# Patient Record
Sex: Female | Born: 1965 | Race: White | Hispanic: No | Marital: Married | Smoking: Never smoker
Health system: Southern US, Community
[De-identification: ages and names within clinical notes are randomized; demographics above are authoritative.]

## PROBLEM LIST (undated history)

## (undated) DIAGNOSIS — J189 Pneumonia, unspecified organism: Secondary | ICD-10-CM

## (undated) DIAGNOSIS — R112 Nausea with vomiting, unspecified: Secondary | ICD-10-CM

## (undated) DIAGNOSIS — I10 Essential (primary) hypertension: Secondary | ICD-10-CM

## (undated) DIAGNOSIS — F329 Major depressive disorder, single episode, unspecified: Secondary | ICD-10-CM

## (undated) DIAGNOSIS — N200 Calculus of kidney: Secondary | ICD-10-CM

## (undated) DIAGNOSIS — F419 Anxiety disorder, unspecified: Secondary | ICD-10-CM

## (undated) DIAGNOSIS — R000 Tachycardia, unspecified: Secondary | ICD-10-CM

## (undated) DIAGNOSIS — F32A Depression, unspecified: Secondary | ICD-10-CM

## (undated) DIAGNOSIS — Z9889 Other specified postprocedural states: Secondary | ICD-10-CM

## (undated) DIAGNOSIS — K219 Gastro-esophageal reflux disease without esophagitis: Secondary | ICD-10-CM

## (undated) DIAGNOSIS — K760 Fatty (change of) liver, not elsewhere classified: Secondary | ICD-10-CM

## (undated) DIAGNOSIS — E119 Type 2 diabetes mellitus without complications: Secondary | ICD-10-CM

## (undated) HISTORY — PX: APPENDECTOMY: SHX54

## (undated) HISTORY — PX: CHOLECYSTECTOMY: SHX55

## (undated) SURGERY — UPPER ENDOSCOPIC ULTRASOUND (EUS) RADIAL
Anesthesia: Monitor Anesthesia Care

---
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
## 2013-11-29 ENCOUNTER — Other Ambulatory Visit: Payer: Self-pay | Admitting: Urology

## 2013-11-29 ENCOUNTER — Ambulatory Visit (INDEPENDENT_AMBULATORY_CARE_PROVIDER_SITE_OTHER): Payer: No Typology Code available for payment source | Admitting: Urology

## 2013-11-29 DIAGNOSIS — R3129 Other microscopic hematuria: Secondary | ICD-10-CM

## 2013-11-29 DIAGNOSIS — R312 Other microscopic hematuria: Secondary | ICD-10-CM

## 2013-12-20 ENCOUNTER — Other Ambulatory Visit (HOSPITAL_COMMUNITY): Payer: Self-pay

## 2014-01-03 ENCOUNTER — Ambulatory Visit (HOSPITAL_COMMUNITY): Payer: No Typology Code available for payment source

## 2014-02-03 ENCOUNTER — Ambulatory Visit (HOSPITAL_COMMUNITY): Payer: Private Health Insurance - Indemnity

## 2014-02-07 ENCOUNTER — Ambulatory Visit (INDEPENDENT_AMBULATORY_CARE_PROVIDER_SITE_OTHER): Payer: Private Health Insurance - Indemnity | Admitting: Urology

## 2014-02-07 DIAGNOSIS — R312 Other microscopic hematuria: Secondary | ICD-10-CM

## 2015-02-24 ENCOUNTER — Other Ambulatory Visit: Payer: Self-pay | Admitting: Urology

## 2015-02-24 DIAGNOSIS — R3129 Other microscopic hematuria: Secondary | ICD-10-CM

## 2015-03-06 ENCOUNTER — Ambulatory Visit (HOSPITAL_COMMUNITY)
Admission: RE | Admit: 2015-03-06 | Discharge: 2015-03-06 | Disposition: A | Discharge status: Home / Self Care | Payer: BLUE CROSS/BLUE SHIELD | Source: Ambulatory Visit | Attending: Urology | Admitting: Urology

## 2015-03-06 DIAGNOSIS — R3129 Other microscopic hematuria: Secondary | ICD-10-CM | POA: Insufficient documentation

## 2015-03-06 DIAGNOSIS — Z0000 Encounter for general adult medical examination without abnormal findings: Secondary | ICD-10-CM | POA: Diagnosis present

## 2015-03-06 MED ORDER — IOHEXOL 300 MG/ML  SOLN
125.0000 mL | Freq: Once | INTRAMUSCULAR | Status: AC | PRN
  Administered 2015-03-06: 150 mL via INTRAVENOUS

## 2015-03-06 MED ORDER — SODIUM CHLORIDE 0.9 % IV SOLN
INTRAVENOUS | Status: AC
  Filled 2015-03-06: qty 250

## 2018-01-31 DIAGNOSIS — M1990 Unspecified osteoarthritis, unspecified site: Secondary | ICD-10-CM

## 2018-01-31 DIAGNOSIS — C801 Malignant (primary) neoplasm, unspecified: Secondary | ICD-10-CM

## 2018-01-31 HISTORY — DX: Malignant (primary) neoplasm, unspecified: C80.1

## 2018-01-31 HISTORY — DX: Unspecified osteoarthritis, unspecified site: M19.90

## 2018-07-02 DIAGNOSIS — Z87442 Personal history of urinary calculi: Secondary | ICD-10-CM

## 2018-07-02 HISTORY — DX: Personal history of urinary calculi: Z87.442

## 2018-07-08 ENCOUNTER — Other Ambulatory Visit: Payer: Self-pay

## 2018-07-08 ENCOUNTER — Emergency Department (HOSPITAL_COMMUNITY): Payer: BC Managed Care – PPO

## 2018-07-08 ENCOUNTER — Encounter (HOSPITAL_COMMUNITY): Payer: Self-pay | Admitting: *Deleted

## 2018-07-08 ENCOUNTER — Inpatient Hospital Stay (HOSPITAL_COMMUNITY)
Admission: EM | Admit: 2018-07-08 | Discharge: 2018-07-14 | DRG: 177 | Disposition: A | Discharge status: Home / Self Care | Payer: BC Managed Care – PPO | Attending: Internal Medicine | Admitting: Internal Medicine

## 2018-07-08 DIAGNOSIS — Z833 Family history of diabetes mellitus: Secondary | ICD-10-CM

## 2018-07-08 DIAGNOSIS — N390 Urinary tract infection, site not specified: Secondary | ICD-10-CM | POA: Diagnosis not present

## 2018-07-08 DIAGNOSIS — E876 Hypokalemia: Secondary | ICD-10-CM | POA: Diagnosis present

## 2018-07-08 DIAGNOSIS — Z791 Long term (current) use of non-steroidal anti-inflammatories (NSAID): Secondary | ICD-10-CM | POA: Diagnosis not present

## 2018-07-08 DIAGNOSIS — R000 Tachycardia, unspecified: Secondary | ICD-10-CM | POA: Diagnosis present

## 2018-07-08 DIAGNOSIS — K760 Fatty (change of) liver, not elsewhere classified: Secondary | ICD-10-CM | POA: Diagnosis present

## 2018-07-08 DIAGNOSIS — R109 Unspecified abdominal pain: Secondary | ICD-10-CM | POA: Diagnosis not present

## 2018-07-08 DIAGNOSIS — Z881 Allergy status to other antibiotic agents status: Secondary | ICD-10-CM | POA: Diagnosis not present

## 2018-07-08 DIAGNOSIS — T380X5A Adverse effect of glucocorticoids and synthetic analogues, initial encounter: Secondary | ICD-10-CM | POA: Diagnosis present

## 2018-07-08 DIAGNOSIS — Z9049 Acquired absence of other specified parts of digestive tract: Secondary | ICD-10-CM

## 2018-07-08 DIAGNOSIS — J9601 Acute respiratory failure with hypoxia: Secondary | ICD-10-CM | POA: Diagnosis present

## 2018-07-08 DIAGNOSIS — N200 Calculus of kidney: Secondary | ICD-10-CM | POA: Diagnosis not present

## 2018-07-08 DIAGNOSIS — E785 Hyperlipidemia, unspecified: Secondary | ICD-10-CM | POA: Diagnosis present

## 2018-07-08 DIAGNOSIS — Z6841 Body Mass Index (BMI) 40.0 and over, adult: Secondary | ICD-10-CM

## 2018-07-08 DIAGNOSIS — U071 COVID-19: Secondary | ICD-10-CM | POA: Diagnosis present

## 2018-07-08 DIAGNOSIS — R112 Nausea with vomiting, unspecified: Secondary | ICD-10-CM | POA: Diagnosis not present

## 2018-07-08 DIAGNOSIS — N136 Pyonephrosis: Secondary | ICD-10-CM | POA: Diagnosis present

## 2018-07-08 DIAGNOSIS — N1330 Unspecified hydronephrosis: Secondary | ICD-10-CM | POA: Diagnosis present

## 2018-07-08 DIAGNOSIS — J84116 Cryptogenic organizing pneumonia: Secondary | ICD-10-CM | POA: Diagnosis present

## 2018-07-08 DIAGNOSIS — Z8249 Family history of ischemic heart disease and other diseases of the circulatory system: Secondary | ICD-10-CM | POA: Diagnosis not present

## 2018-07-08 DIAGNOSIS — Z79899 Other long term (current) drug therapy: Secondary | ICD-10-CM

## 2018-07-08 DIAGNOSIS — J1289 Other viral pneumonia: Secondary | ICD-10-CM | POA: Diagnosis present

## 2018-07-08 DIAGNOSIS — Z7984 Long term (current) use of oral hypoglycemic drugs: Secondary | ICD-10-CM

## 2018-07-08 DIAGNOSIS — N3000 Acute cystitis without hematuria: Secondary | ICD-10-CM | POA: Diagnosis not present

## 2018-07-08 DIAGNOSIS — F419 Anxiety disorder, unspecified: Secondary | ICD-10-CM | POA: Diagnosis present

## 2018-07-08 DIAGNOSIS — J189 Pneumonia, unspecified organism: Secondary | ICD-10-CM | POA: Diagnosis present

## 2018-07-08 DIAGNOSIS — R945 Abnormal results of liver function studies: Secondary | ICD-10-CM | POA: Diagnosis present

## 2018-07-08 DIAGNOSIS — E1165 Type 2 diabetes mellitus with hyperglycemia: Secondary | ICD-10-CM | POA: Diagnosis present

## 2018-07-08 DIAGNOSIS — Z87442 Personal history of urinary calculi: Secondary | ICD-10-CM

## 2018-07-08 DIAGNOSIS — I10 Essential (primary) hypertension: Secondary | ICD-10-CM | POA: Diagnosis present

## 2018-07-08 HISTORY — DX: Tachycardia, unspecified: R00.0

## 2018-07-08 HISTORY — DX: Calculus of kidney: N20.0

## 2018-07-08 HISTORY — DX: Essential (primary) hypertension: I10

## 2018-07-08 HISTORY — DX: Fatty (change of) liver, not elsewhere classified: K76.0

## 2018-07-08 HISTORY — DX: Type 2 diabetes mellitus without complications: E11.9

## 2018-07-08 LAB — CBC WITH DIFFERENTIAL/PLATELET
Abs Immature Granulocytes: 0.05 10*3/uL (ref 0.00–0.07)
Basophils Absolute: 0 10*3/uL (ref 0.0–0.1)
Basophils Relative: 0 %
Eosinophils Absolute: 0.1 10*3/uL (ref 0.0–0.5)
Eosinophils Relative: 1 %
HCT: 38.7 % (ref 36.0–46.0)
Hemoglobin: 12.5 g/dL (ref 12.0–15.0)
Immature Granulocytes: 1 %
Lymphocytes Relative: 14 %
Lymphs Abs: 1.3 10*3/uL (ref 0.7–4.0)
MCH: 28.1 pg (ref 26.0–34.0)
MCHC: 32.3 g/dL (ref 30.0–36.0)
MCV: 87 fL (ref 80.0–100.0)
Monocytes Absolute: 0.5 10*3/uL (ref 0.1–1.0)
Monocytes Relative: 5 %
Neutro Abs: 7.5 10*3/uL (ref 1.7–7.7)
Neutrophils Relative %: 79 %
Platelets: 345 10*3/uL (ref 150–400)
RBC: 4.45 MIL/uL (ref 3.87–5.11)
RDW: 13.4 % (ref 11.5–15.5)
WBC: 9.4 10*3/uL (ref 4.0–10.5)
nRBC: 0 % (ref 0.0–0.2)

## 2018-07-08 LAB — URINALYSIS, ROUTINE W REFLEX MICROSCOPIC
Bilirubin Urine: NEGATIVE
Glucose, UA: NEGATIVE mg/dL
Ketones, ur: NEGATIVE mg/dL
Nitrite: NEGATIVE
Protein, ur: 100 mg/dL — AB
Specific Gravity, Urine: 1.021 (ref 1.005–1.030)
pH: 6 (ref 5.0–8.0)

## 2018-07-08 LAB — CBG MONITORING, ED: Glucose-Capillary: 171 mg/dL — ABNORMAL HIGH (ref 70–99)

## 2018-07-08 LAB — FERRITIN: Ferritin: 461 ng/mL — ABNORMAL HIGH (ref 11–307)

## 2018-07-08 LAB — COMPREHENSIVE METABOLIC PANEL
ALT: 59 U/L — ABNORMAL HIGH (ref 0–44)
AST: 46 U/L — ABNORMAL HIGH (ref 15–41)
Albumin: 3.4 g/dL — ABNORMAL LOW (ref 3.5–5.0)
Alkaline Phosphatase: 78 U/L (ref 38–126)
Anion gap: 16 — ABNORMAL HIGH (ref 5–15)
BUN: 16 mg/dL (ref 6–20)
CO2: 20 mmol/L — ABNORMAL LOW (ref 22–32)
Calcium: 8.4 mg/dL — ABNORMAL LOW (ref 8.9–10.3)
Chloride: 101 mmol/L (ref 98–111)
Creatinine, Ser: 0.95 mg/dL (ref 0.44–1.00)
GFR calc Af Amer: >60 mL/min (ref >60)
GFR calc non Af Amer: >60 mL/min (ref >60)
Glucose, Bld: 233 mg/dL — ABNORMAL HIGH (ref 70–99)
Potassium: 2.7 mmol/L — CL (ref 3.5–5.1)
Sodium: 137 mmol/L (ref 135–145)
Total Bilirubin: 0.5 mg/dL (ref 0.3–1.2)
Total Protein: 8.1 g/dL (ref 6.5–8.1)

## 2018-07-08 LAB — LACTATE DEHYDROGENASE: LDH: 190 U/L (ref 98–192)

## 2018-07-08 LAB — SARS CORONAVIRUS 2 BY RT PCR (HOSPITAL ORDER, PERFORMED IN ~~LOC~~ HOSPITAL LAB): SARS Coronavirus 2: POSITIVE — AB

## 2018-07-08 LAB — BRAIN NATRIURETIC PEPTIDE: B Natriuretic Peptide: 12 pg/mL (ref 0.0–100.0)

## 2018-07-08 LAB — D-DIMER, QUANTITATIVE: D-Dimer, Quant: 0.95 ug/mL-FEU — ABNORMAL HIGH (ref 0.00–0.50)

## 2018-07-08 LAB — SEDIMENTATION RATE: Sed Rate: 65 mm/hr — ABNORMAL HIGH (ref 0–22)

## 2018-07-08 LAB — PROCALCITONIN: Procalcitonin: <0.1 ng/mL

## 2018-07-08 MED ORDER — POTASSIUM CHLORIDE IN NACL 20-0.9 MEQ/L-% IV SOLN
INTRAVENOUS | Status: DC
  Administered 2018-07-08 – 2018-07-09 (×2): via INTRAVENOUS
  Filled 2018-07-08 (×2): qty 1000

## 2018-07-08 MED ORDER — POTASSIUM CHLORIDE 10 MEQ/100ML IV SOLN
10.0000 meq | Freq: Once | INTRAVENOUS | Status: AC
  Administered 2018-07-08: 18:00:00 10 meq via INTRAVENOUS

## 2018-07-08 MED ORDER — SODIUM CHLORIDE 0.9 % IV BOLUS
1000.0000 mL | Freq: Once | INTRAVENOUS | Status: AC
  Administered 2018-07-08: 16:00:00 1000 mL via INTRAVENOUS

## 2018-07-08 MED ORDER — SODIUM CHLORIDE 0.9 % IV SOLN
1.0000 g | Freq: Once | INTRAVENOUS | Status: AC
  Administered 2018-07-08: 18:00:00 1 g via INTRAVENOUS
  Filled 2018-07-08: qty 10

## 2018-07-08 MED ORDER — CLONAZEPAM 0.5 MG PO TABS
0.2500 mg | ORAL_TABLET | Freq: Once | ORAL | Status: AC
  Administered 2018-07-09: 0.25 mg via ORAL
  Filled 2018-07-08: qty 1

## 2018-07-08 MED ORDER — PROMETHAZINE HCL 25 MG/ML IJ SOLN
12.5000 mg | Freq: Once | INTRAMUSCULAR | Status: AC
  Administered 2018-07-08: 16:00:00 12.5 mg via INTRAVENOUS
  Filled 2018-07-08: qty 1

## 2018-07-08 MED ORDER — POTASSIUM CHLORIDE 10 MEQ/100ML IV SOLN
INTRAVENOUS | Status: AC
  Filled 2018-07-08: qty 100

## 2018-07-08 MED ORDER — SODIUM CHLORIDE 0.9 % IV SOLN
INTRAVENOUS | Status: AC
  Filled 2018-07-08: qty 10

## 2018-07-08 MED ORDER — SODIUM CHLORIDE 0.9 % IV SOLN
500.0000 mg | Freq: Once | INTRAVENOUS | Status: AC
  Administered 2018-07-08: 22:00:00 500 mg via INTRAVENOUS
  Filled 2018-07-08: qty 500

## 2018-07-08 MED ORDER — POTASSIUM CHLORIDE CRYS ER 20 MEQ PO TBCR
40.0000 meq | EXTENDED_RELEASE_TABLET | Freq: Once | ORAL | Status: DC
  Filled 2018-07-08: qty 2

## 2018-07-08 MED ORDER — METOCLOPRAMIDE HCL 5 MG/ML IJ SOLN
10.0000 mg | Freq: Once | INTRAMUSCULAR | Status: AC
  Administered 2018-07-08: 18:00:00 10 mg via INTRAVENOUS
  Filled 2018-07-08: qty 2

## 2018-07-08 MED ORDER — INSULIN ASPART 100 UNIT/ML ~~LOC~~ SOLN
0.0000 [IU] | SUBCUTANEOUS | Status: DC
  Administered 2018-07-09 (×3): 2 [IU] via SUBCUTANEOUS
  Filled 2018-07-08 (×2): qty 1

## 2018-07-08 MED ORDER — POTASSIUM CHLORIDE 10 MEQ/100ML IV SOLN
10.0000 meq | Freq: Once | INTRAVENOUS | Status: AC
  Administered 2018-07-08: 19:00:00 10 meq via INTRAVENOUS
  Filled 2018-07-08: qty 100

## 2018-07-08 MED ORDER — ONDANSETRON HCL 4 MG/2ML IJ SOLN
4.0000 mg | Freq: Four times a day (QID) | INTRAMUSCULAR | Status: DC | PRN
  Administered 2018-07-09: 04:00:00 4 mg via INTRAVENOUS
  Filled 2018-07-08: qty 2

## 2018-07-08 MED ORDER — SODIUM CHLORIDE 0.9 % IV BOLUS
1000.0000 mL | Freq: Once | INTRAVENOUS | Status: AC
  Administered 2018-07-08: 1000 mL via INTRAVENOUS

## 2018-07-08 MED ORDER — HYDROMORPHONE HCL 1 MG/ML IJ SOLN: 0.5000 mg | Freq: Four times a day (QID) | INTRAMUSCULAR | Status: DC | PRN

## 2018-07-08 MED ORDER — HYDROMORPHONE HCL 1 MG/ML IJ SOLN: 0.5000 mg | Freq: Once | INTRAMUSCULAR | Status: DC

## 2018-07-08 NOTE — ED Notes (Signed)
Pt still feels nauseated and cannot tolerate po K dur at this time.  MD made aware.

## 2018-07-08 NOTE — ED Notes (Signed)
CONTACT INFO:  Merrily Pew (husband): [phone]

## 2018-07-08 NOTE — H&P (Signed)
TRH H&P    Patient Demographics:    Leah Farmer, is a 53 y.o. female  MRN: [id number]  DOB - 11-07-1965  Admit Date - 07/08/2018  Referring MD/NP/PA:  Denton Meek  Outpatient Primary MD for the patient is Margy Clarks, NP [facility] in the past  Patient coming from:  home  Chief complaint- n/v, bilateral flank pain with radiation to bilateral groin   HPI:    Leah Farmer  is a 53 y.o. female, w Dm2, h/o nephrolithiasis (passed by self), presents with c/o bilateral flank pain radiating to groin since this past Monday.  Pt also notes n/v, possibly slight dysuria.  No hematuria. No fever/ chills.  Pt notes slight dry cough, w slight dyspnea.  Pt denies cp, palp, abd pain, diarrhea, brbpr.  Pt presented primarily due to flank pain, and n/v.   In ED,  T 99.9  P 130  R 27  Bp 106/74  Pox 92% on RA Wt 113.4 kg  CT renal stone IMPRESSION: 1. There are numerous stacked calculi in the distal left ureter, measuring approximately 4.7 cm in total length, the largest discrete calculus measuring 1.4 cm (series 5, image 65). There is moderate left hydronephrosis and hydroureter. There is an additional nonobstructive calculus in the inferior pole of the left kidney. 2. There is extensive, irregular bibasilar opacity and consolidation in a peribronchovascular pattern, of uncertain etiology, possibly acutely infectious or inflammatory although chronic interstitial lung disease is a differential consideration. Recommend nonemergent ILD protocol CT examination of the chest to further evaluate. 3.  Hepatic steatosis.  CXR IMPRESSION: 1. Unusual appearance in the lungs, which based on comparison with recent CT the abdomen and pelvis is favored to reflect widespread cryptogenic organizing pneumonia, likely sequela of recent atypical infection (including viral pneumonia such as COVID-19).  Wbc 9.4,  Hgb 12.5, Plt 345 Na 137, K 2.7   Bun  16, Creatinine 0.95 Hco3=20 Glucose 233  Ekg st at 120, LAD, nl int, no st- t changes c/w ischemia  Kcl 40 meq po x1 , dilaudid 0.12m iv x1 , phenergan 12.51miv x1, reglan 1055mv x1 given in ED,  Rocephin 1mg64m x1 given in ED, zithromax 500mg86mx1 given in ED.   Pt will be admitted for n/v, bilateral flank pain secondary to nephrolithiasis with L hydronephrosis, and acute UTI, and also CAP/ Covid -19 +      Review of systems:    In addition to the HPI above,  Pt doesn't note any altered taste or smell No Fever-chills, No Headache, No changes with Vision or hearing, No problems swallowing food or Liquids, No Chest pain, No Abdominal pain, bowel movements are regular, No Blood in stool or Urine, No dysuria, No new skin rashes or bruises, No new joints pains-aches,  No new weakness, tingling, numbness in any extremity, No recent weight gain or loss, No polyuria, polydypsia or polyphagia, No significant Mental Stressors.  All other systems reviewed and are negative.    Past History of the following :  Past Medical History:  Diagnosis Date  . Diabetes mellitus without complication ([place])   . Hypertension   . NAFL (nonalcoholic fatty liver) 67/54/4920   fatty liver seen on CT scan abd/ pelvis  . Nephrolithiasis   . Tachycardia       Past Surgical History:  Procedure Laterality Date  . APPENDECTOMY    . CHOLECYSTECTOMY        Social History:      Social History   Tobacco Use  . Smoking status: Never Smoker  . Smokeless tobacco: Never Used  Substance Use Topics  . Alcohol use: Never    Frequency: Never       Family History :     Family History  Problem Relation Age of Onset  . Hypertension Mother   . Diabetes Mother   . Hypertension Father   . Diabetes Father      Home Medications:   Prior to Admission medications   Medication Sig Start Date End Date Taking? Authorizing Provider  clonazePAM  (KLONOPIN) 0.5 MG tablet Take 0.25-0.5 mg by mouth daily as needed for anxiety.   Yes [provider]  ibuprofen (ADVIL) 200 MG tablet Take 200-800 mg by mouth every 6 (six) hours as needed for mild pain or moderate pain.   Yes [provider]  lisinopril-hydrochlorothiazide (ZESTORETIC) 20-12.5 MG tablet Take 1 tablet by mouth daily.   Yes [provider]  meclizine (ANTIVERT) 12.5 MG tablet Take 12.5 mg by mouth 3 (three) times daily as needed for dizziness.   Yes [provider]  metoprolol succinate (TOPROL-XL) 50 MG 24 hr tablet Take 50 mg by mouth daily. Take with or immediately following a meal.   Yes [provider]  norgestrel-ethinyl estradiol (CRYSELLE-28) 0.3-30 MG-MCG tablet Take 1 tablet by mouth daily.   Yes [provider]  omeprazole (PRILOSEC) 20 MG capsule Take 20 mg by mouth every morning.   Yes [provider]  rosuvastatin (CRESTOR) 10 MG tablet Take 10 mg by mouth at bedtime.   Yes [provider]  SitaGLIPtin-MetFORMIN HCl (JANUMET XR) [phone] MG TB24 Take 1 tablet by mouth every evening.   Yes [provider]  Vitamin D, Ergocalciferol, (DRISDOL) 1.25 MG (50000 UT) CAPS capsule Take 50,000 Units by mouth every Saturday.   Yes [provider]     Allergies:     Allergies  Allergen Reactions  . Ciprofloxacin Hives     Physical Exam:   Vitals  Blood pressure 106/74, pulse (!) 117, temperature 99.9 F (37.7 C), temperature source Oral, resp. rate (!) 22, height '5\' 4"'  (1.626 m), weight 113.4 kg, SpO2 92 %.  1.  General: axox3  2. Psychiatric: euthymic  3. Neurologic: cn2-12 intact, reflexes 2+symmetric, direct, consensual, near intact Motor 5/5 in all 4 ext  4. HEENMT:  Anicteric, pupils  1.13m symmetric, direct, consensual, near intact Mucous membranes moist Neck: no jvd, no bruit   5. Respiratory : Slight crackles bilateral base, no wheezing, tachypneic  6.  Cardiovascular : Tachycardic s1, s2, no m/g/r   7. Gastrointestinal:  Abd: soft, nt, nd, +bs  8. Skin:  Ext: no c/c/e, no rash No CVA tenderness  9.Musculoskeletal:  Good ROM,  No adenopathy    Data Review:    CBC Recent Labs  Lab 07/08/18 1610  WBC 9.4  HGB 12.5  HCT 38.7  PLT 345  MCV 87.0  MCH 28.1  MCHC 32.3  RDW 13.4  LYMPHSABS 1.3  MONOABS 0.5  EOSABS  0.1  BASOSABS 0.0   ------------------------------------------------------------------------------------------------------------------  Results for orders placed or performed during the hospital encounter of 07/08/18 (from the past 48 hour(s))  CBC with Differential/Platelet     Status: None   Collection Time: 07/08/18  4:10 PM  Result Value Ref Range   WBC 9.4 4.0 - 10.5 K/uL   RBC 4.45 3.87 - 5.11 MIL/uL   Hemoglobin 12.5 12.0 - 15.0 g/dL   HCT 38.7 36.0 - 46.0 %   MCV 87.0 80.0 - 100.0 fL   MCH 28.1 26.0 - 34.0 pg   MCHC 32.3 30.0 - 36.0 g/dL   RDW 13.4 11.5 - 15.5 %   Platelets 345 150 - 400 K/uL   nRBC 0.0 0.0 - 0.2 %   Neutrophils Relative % 79 %   Neutro Abs 7.5 1.7 - 7.7 K/uL   Lymphocytes Relative 14 %   Lymphs Abs 1.3 0.7 - 4.0 K/uL   Monocytes Relative 5 %   Monocytes Absolute 0.5 0.1 - 1.0 K/uL   Eosinophils Relative 1 %   Eosinophils Absolute 0.1 0.0 - 0.5 K/uL   Basophils Relative 0 %   Basophils Absolute 0.0 0.0 - 0.1 K/uL   Immature Granulocytes 1 %   Abs Immature Granulocytes 0.05 0.00 - 0.07 K/uL    Comment: Performed at [facility], [address], [place], [place] [address]  Comprehensive metabolic panel     Status: Abnormal   Collection Time: 07/08/18  4:10 PM  Result Value Ref Range   Sodium 137 135 - 145 mmol/L   Potassium 2.7 (LL) 3.5 - 5.1 mmol/L    Comment: CRITICAL RESULT CALLED TO, READ BACK BY AND VERIFIED WITH: CARDWELL,L'@1711'  BY MATTHEWS, B 6.7.2020    Chloride 101 98 - 111 mmol/L   CO2 20 (L) 22 - 32 mmol/L   Glucose, Bld 233 (H) 70 - 99 mg/dL   BUN 16 6 - 20  mg/dL   Creatinine, Ser 0.95 0.44 - 1.00 mg/dL   Calcium 8.4 (L) 8.9 - 10.3 mg/dL   Total Protein 8.1 6.5 - 8.1 g/dL   Albumin 3.4 (L) 3.5 - 5.0 g/dL   AST 46 (H) 15 - 41 U/L   ALT 59 (H) 0 - 44 U/L   Alkaline Phosphatase 78 38 - 126 U/L   Total Bilirubin 0.5 0.3 - 1.2 mg/dL   GFR calc non Af Amer >60 >60 mL/min   GFR calc Af Amer >60 >60 mL/min   Anion gap 16 (H) 5 - 15    Comment: Performed at [facility], [address], [place], [place] [address]  Urinalysis, Routine w reflex microscopic     Status: Abnormal   Collection Time: 07/08/18  4:11 PM  Result Value Ref Range   Color, Urine AMBER (A) YELLOW    Comment: BIOCHEMICALS MAY BE AFFECTED BY COLOR   APPearance CLOUDY (A) CLEAR   Specific Gravity, Urine 1.021 1.005 - 1.030   pH 6.0 5.0 - 8.0   Glucose, UA NEGATIVE NEGATIVE mg/dL   Hgb urine dipstick MODERATE (A) NEGATIVE   Bilirubin Urine NEGATIVE NEGATIVE   Ketones, ur NEGATIVE NEGATIVE mg/dL   Protein, ur 100 (A) NEGATIVE mg/dL   Nitrite NEGATIVE NEGATIVE   Leukocytes,Ua TRACE (A) NEGATIVE   RBC / HPF 6-10 0 - 5 RBC/hpf   WBC, UA 11-20 0 - 5 WBC/hpf   Bacteria, UA RARE (A) NONE SEEN   Squamous Epithelial / LPF 6-10 0 - 5   Mucus PRESENT  Comment: Performed at [facility], [address], [place], [place] [address]  SARS Coronavirus 2 ([place]- Performed in [place] hospital lab), Hosp Order     Status: Abnormal   Collection Time: 07/08/18  6:21 PM  Result Value Ref Range   SARS Coronavirus 2 POSITIVE (A) NEGATIVE    Comment: RESULT CALLED TO, READ BACK BY AND VERIFIED WITH: DOSS,M @ 2116 ON 07/08/18 BY JUW (NOTE) If result is NEGATIVE SARS-CoV-2 target nucleic acids are NOT DETECTED. The SARS-CoV-2 RNA is generally detectable in upper and lower  respiratory specimens during the acute phase of infection. The lowest  concentration of SARS-CoV-2 viral copies this assay can detect is 250  copies / mL. A negative result does not preclude SARS-CoV-2 infection  and  should not be used as the sole basis for treatment or other  patient management decisions.  A negative result may occur with  improper specimen collection / handling, submission of specimen other  than nasopharyngeal swab, presence of viral mutation(s) within the  areas targeted by this assay, and inadequate number of viral copies  (<250 copies / mL). A negative result must be combined with clinical  observations, patient history, and epidemiological information. If result is POSITIVE SARS-CoV-2 target nucleic acids are DETECTED. The SA RS-CoV-2 RNA is generally detectable in upper and lower  respiratory specimens during the acute phase of infection.  Positive  results are indicative of active infection with SARS-CoV-2.  Clinical  correlation with patient history and other diagnostic information is  necessary to determine patient infection status.  Positive results do  not rule out bacterial infection or co-infection with other viruses. If result is PRESUMPTIVE POSTIVE SARS-CoV-2 nucleic acids MAY BE PRESENT.   A presumptive positive result was obtained on the submitted specimen  and confirmed on repeat testing.  While 2019 novel coronavirus  (SARS-CoV-2) nucleic acids may be present in the submitted sample  additional confirmatory testing may be necessary for epidemiological  and / or clinical management purposes  to differentiate between  SARS-CoV-2 and other Sarbecovirus currently known to infect humans.  If clinically indicated additional testing with an alternate test  methodology [id number]) is advi sed. The SARS-CoV-2 RNA is generally  detectable in upper and lower respiratory specimens during the acute  phase of infection. The expected result is Negative. Fact Sheet for Patients:  StrictlyIdeas.no Fact Sheet for Healthcare Providers: BankingDealers.co.za This test is not yet approved or cleared by the [place] FDA and has been  authorized for detection and/or diagnosis of SARS-CoV-2 by FDA under an Emergency Use Authorization (EUA).  This EUA will remain in effect (meaning this test can be used) for the duration of the COVID-19 declaration under Section 564(b)(1) of the Act, 21 U.S.C. section 360bbb-3(b)(1), unless the authorization is terminated or revoked sooner. Performed at [facility], [address] [place], [place] [address]     Chemistries  Recent Labs  Lab 07/08/18 1610  NA 137  K 2.7*  CL 101  CO2 20*  GLUCOSE 233*  BUN 16  CREATININE 0.95  CALCIUM 8.4*  AST 46*  ALT 59*  ALKPHOS 78  BILITOT 0.5   ------------------------------------------------------------------------------------------------------------------  ------------------------------------------------------------------------------------------------------------------ GFR: Estimated Creatinine Clearance: 86.5 mL/min (by C-G formula based on SCr of 0.95 mg/dL). Liver Function Tests: Recent Labs  Lab 07/08/18 1610  AST 46*  ALT 59*  ALKPHOS 78  BILITOT 0.5  PROT 8.1  ALBUMIN 3.4*   No results for input(s): LIPASE, AMYLASE in the last 168 hours. No  results for input(s): AMMONIA in the last 168 hours. Coagulation Profile: No results for input(s): INR, PROTIME in the last 168 hours. Cardiac Enzymes: No results for input(s): CKTOTAL, CKMB, CKMBINDEX, TROPONINI in the last 168 hours. BNP (last 3 results) No results for input(s): PROBNP in the last 8760 hours. HbA1C: No results for input(s): HGBA1C in the last 72 hours. CBG: No results for input(s): GLUCAP in the last 168 hours. Lipid Profile: No results for input(s): CHOL, HDL, LDLCALC, TRIG, CHOLHDL, LDLDIRECT in the last 72 hours. Thyroid Function Tests: No results for input(s): TSH, T4TOTAL, FREET4, T3FREE, THYROIDAB in the last 72 hours. Anemia Panel: No results for input(s): VITAMINB12, FOLATE, FERRITIN, TIBC, IRON, RETICCTPCT in the last 72 hours.   --------------------------------------------------------------------------------------------------------------- Urine analysis:    Component Value Date/Time   COLORURINE AMBER (A) 07/08/2018 1611   APPEARANCEUR CLOUDY (A) 07/08/2018 1611   LABSPEC 1.021 07/08/2018 1611   PHURINE 6.0 07/08/2018 1611   GLUCOSEU NEGATIVE 07/08/2018 1611   HGBUR MODERATE (A) 07/08/2018 1611   BILIRUBINUR NEGATIVE 07/08/2018 1611   KETONESUR NEGATIVE 07/08/2018 1611   PROTEINUR 100 (A) 07/08/2018 1611   NITRITE NEGATIVE 07/08/2018 1611   LEUKOCYTESUR TRACE (A) 07/08/2018 1611      Imaging Results:    Dg Chest Portable 1 View  Result Date: 07/08/2018 CLINICAL DATA:  53 year old female with history of shortness of breath. EXAM: PORTABLE CHEST 1 VIEW COMPARISON:  Chest x-ray 07/08/2018. FINDINGS: Widespread interstitial prominence and patchy ill-defined airspace disease scattered throughout the lungs bilaterally. No definite pleural effusions. Pulmonary vasculature is obscured. Heart size is borderline enlarged. Upper mediastinal contours are within normal limits. IMPRESSION: 1. Unusual appearance in the lungs, which based on comparison with recent CT the abdomen and pelvis is favored to reflect widespread cryptogenic organizing pneumonia, likely sequela of recent atypical infection (including viral pneumonia such as COVID-19). Electronically Signed   By: Vinnie Langton M.D.   On: 07/08/2018 20:49   Ct Renal Stone Study  Result Date: 07/08/2018 CLINICAL DATA:  Urinary frequency, dysuria, lower back pain EXAM: CT ABDOMEN AND PELVIS WITHOUT CONTRAST TECHNIQUE: Multidetector CT imaging of the abdomen and pelvis was performed following the standard protocol without IV contrast. COMPARISON:  03/06/2015 FINDINGS: Lower chest: There is extensive, irregular bibasilar opacity and consolidation in a peribronchovascular pattern. Hepatobiliary: No solid liver abnormality is seen. Hepatic steatosis. Status post  cholecystectomy. Pancreas: Unremarkable. No pancreatic ductal dilatation or surrounding inflammatory changes. Spleen: Normal in size without significant abnormality. Adrenals/Urinary Tract: Adrenal glands are unremarkable. There are numerous stacked calculi in the distal left ureter, measuring approximately 4.7 cm in total length, the largest discrete calculus measuring 1.4 cm (series 5, image 65). There is moderate left hydronephrosis and hydroureter. There is an additional nonobstructive calculus in the inferior pole of the left kidney. Bladder is unremarkable. Stomach/Bowel: Stomach is within normal limits. No evidence of bowel wall thickening, distention, or inflammatory changes. Vascular/Lymphatic: No significant vascular findings are present. No enlarged abdominal or pelvic lymph nodes. Reproductive: No mass or other significant abnormality. Other: No abdominal wall hernia or abnormality. No abdominopelvic ascites. Musculoskeletal: No acute or significant osseous findings. IMPRESSION: 1. There are numerous stacked calculi in the distal left ureter, measuring approximately 4.7 cm in total length, the largest discrete calculus measuring 1.4 cm (series 5, image 65). There is moderate left hydronephrosis and hydroureter. There is an additional nonobstructive calculus in the inferior pole of the left kidney. 2. There is extensive, irregular bibasilar opacity and consolidation in a peribronchovascular pattern,  of uncertain etiology, possibly acutely infectious or inflammatory although chronic interstitial lung disease is a differential consideration. Recommend nonemergent ILD protocol CT examination of the chest to further evaluate. 3.  Hepatic steatosis. Electronically Signed   By: Eddie Candle M.D.   On: 07/08/2018 18:00       Assessment & Plan:    Principal Problem:   COVID-19 virus infection Active Problems:   Nausea & vomiting   Acute flank pain   Flank pain   Nephrolithiasis   Hydronephrosis,  left   Acute lower UTI   Tachycardia   CAP (community acquired pneumonia)   Nephrolithiasis with L hydronephrosis Acute UTI Blood culture x2 Repeat urinalysis stat (in and out cath), along with urine culture Hydrate with ns + 20 meq Kcl  at 12m per hour x 1 day NPO except for sips with medications Rocephin 1gm iv qday Urology consulted, spoke with KAndee Poles appreciate input  Bilateral flank pain  Dilaudid 0.5834miv q6h prn pain if sbp >100  N/v zofran 34m13mv q6h prn   Hypokalemia secondary to n/v Replete Check magnesium in am Check cmp in am  Dyspnea/ Cough secondary to CAP/ Covid -19 infection Blood culture x2 as above Urine strep antigen, urine legionella antigen No sputum gs/ culture due to nonproductive cough Rocephin 1gm iv qday, Zithromax 500m42m qday Check cbc in am  COVID + Precautions Check d dimer, trop, ferritin, crp, ESR, Ldh, IL-6  Tachycardia Tele Trop I q6h x3 Check TSH Check cardiac echo Cont Toprol XL 50mg23mqday (hold if sbp <100, hr <60)  Abnormal liver function ddx NAFLD, Covid -19, Crestor Check acute hepatitis panel Check cmp in am  Dm2 STOP Janumet XR fsbs q4h, ISS  Hyperlipidemia STOP Crestor for now due to abnormal liver function  Hypertension STOP Zestoretic due to her bp being low end of normal Cont Toprol XL if bp will allow  Anxiety Klonopin, 0.25mg 22m1  (warned patient of possible respiratory depression with this and pain medication, pt willing to accept risk)  Abnormal CT scan Needs ILD protocol CT chest as outpatient  DVT Prophylaxis-    - SCDs , no lovenox incase needs urologic intervention in AM.   AM Labs Ordered, also please review Full Orders  Family Communication: Admission, patients condition and plan of care including tests being ordered have been discussed with the patient and husband who indicate understanding and agree with the plan and Code Status.  Code Status:  FULL CODE, spoke with  husband and updated him on status of wife [facility]. Husband  Warned to self quarantine and get tested for covid-19  Admission status: Observation/Inpatient: Based on patients clinical presentation and evaluation of above clinical data, I have made determination that patient meets Inpatient criteria at this time.  Pt is covid + with cap, and also L hydronephrosis secondary to nephrolithiasis largest 1.4cm.  Pt will likely require urologic intervention and tx with iv abx, she is at high risk clinically of decompensation (covid +, cough, dyspnea, diabetic, obese), and will require > 2 nites stay.   Time spent in minutes : 70 minutes   Paitynn Mikus Jani Graveln 07/08/2018 at 10:24 PM

## 2018-07-08 NOTE — ED Notes (Signed)
Pt's O2 sat % 88-89% on RA. This RN placed pt on 2LPM, O2 sat % ranging 95-97%

## 2018-07-08 NOTE — ED Triage Notes (Signed)
Pt c/o urinary frequency, dysuria, lower back pain that radiates around to lower abdomen, nausea and vomiting that started Sunday. Pt was seen by PCP this week and told that she possibly had a kidney stone trying to pass. Pt reports the vomiting had gone away but the nausea still persists despite taking Zofran. Pt also reports her pain has now subsided but she hasn't noticed any kidney stone that she might have passed. Pt c/o generalized weakness.

## 2018-07-08 NOTE — ED Notes (Signed)
Pt states her nausea is slightly improved.

## 2018-07-08 NOTE — ED Notes (Signed)
CRITICAL VALUE ALERT  Critical Value:  K 2.7 Date & Time Notied:  07/08/2018, 1713 Provider Notified:07/08/2018, 1714  Orders Received/Actions taken: see chart

## 2018-07-08 NOTE — ED Provider Notes (Signed)
[facility] Provider Note   CSN: [id number] Arrival date & time: 07/08/18  1533    History   Chief Complaint No chief complaint on file.   HPI Leah Farmer is a 53 y.o. female.     Patient complains of dysuria and pain in her right groin.  She also has been having vomiting.  Patient saw her family doctor and they did a urine culture on her but did not start her on antibiotics.  Patient also complains of mild shortness of breath  The history is provided by the patient. No language interpreter was used.  Abdominal Pain  Pain location:  RLQ Pain quality: aching   Pain radiation: Right flank. Pain severity:  Mild Onset quality:  Sudden Timing:  Constant Progression:  Worsening Chronicity:  New Context: not alcohol use   Relieved by:  Nothing Associated symptoms: shortness of breath   Associated symptoms: no chest pain, no cough, no diarrhea, no fatigue and no hematuria     Past Medical History:  Diagnosis Date  . Diabetes mellitus without complication ([place])   . Hypertension   . Tachycardia     There are no active problems to display for this patient.   Past Surgical History:  Procedure Laterality Date  . CHOLECYSTECTOMY       OB History   No obstetric history on file.      Home Medications    Prior to Admission medications   Medication Sig Start Date End Date Taking? Authorizing Provider  clonazePAM (KLONOPIN) 0.5 MG tablet Take 0.25-0.5 mg by mouth daily as needed for anxiety.   Yes [provider]  ibuprofen (ADVIL) 200 MG tablet Take 200-800 mg by mouth every 6 (six) hours as needed for mild pain or moderate pain.   Yes [provider]  lisinopril-hydrochlorothiazide (ZESTORETIC) 20-12.5 MG tablet Take 1 tablet by mouth daily.   Yes [provider]  meclizine (ANTIVERT) 12.5 MG tablet Take 12.5 mg by mouth 3 (three) times daily as needed for dizziness.   Yes [provider]  metoprolol  succinate (TOPROL-XL) 50 MG 24 hr tablet Take 50 mg by mouth daily. Take with or immediately following a meal.   Yes [provider]  norgestrel-ethinyl estradiol (CRYSELLE-28) 0.3-30 MG-MCG tablet Take 1 tablet by mouth daily.   Yes [provider]  omeprazole (PRILOSEC) 20 MG capsule Take 20 mg by mouth every morning.   Yes [provider]  rosuvastatin (CRESTOR) 10 MG tablet Take 10 mg by mouth at bedtime.   Yes [provider]  SitaGLIPtin-MetFORMIN HCl (JANUMET XR) [phone] MG TB24 Take 1 tablet by mouth every evening.   Yes [provider]  Vitamin D, Ergocalciferol, (DRISDOL) 1.25 MG (50000 UT) CAPS capsule Take 50,000 Units by mouth every Saturday.   Yes [provider]    Family History History reviewed. No pertinent family history.  Social History Social History   Tobacco Use  . Smoking status: Never Smoker  . Smokeless tobacco: Never Used  Substance Use Topics  . Alcohol use: Never    Frequency: Never  . Drug use: Never     Allergies   Ciprofloxacin   Review of Systems Review of Systems  Constitutional: Negative for appetite change and fatigue.  HENT: Negative for congestion, ear discharge and sinus pressure.   Eyes: Negative for discharge.  Respiratory: Positive for shortness of breath. Negative for cough.   Cardiovascular: Negative for chest pain.  Gastrointestinal: Positive for abdominal pain. Negative  for diarrhea.  Genitourinary: Negative for frequency and hematuria.  Musculoskeletal: Negative for back pain.  Skin: Negative for rash.  Neurological: Negative for seizures and headaches.  Psychiatric/Behavioral: Negative for hallucinations.     Physical Exam Updated Vital Signs BP 122/64   Pulse (!) 116   Temp 99.9 F (37.7 C) (Oral)   Resp (!) 24   Ht 5\' 4"  (1.626 m)   Wt 113.4 kg   SpO2 95%   BMI 42.91 kg/m   Physical Exam Vitals signs and nursing note reviewed.  Constitutional:       Appearance: She is well-developed.  HENT:     Head: Normocephalic.     Nose: Nose normal.  Eyes:     General: No scleral icterus.    Conjunctiva/sclera: Conjunctivae normal.  Neck:     Musculoskeletal: Neck supple.     Thyroid: No thyromegaly.  Cardiovascular:     Rate and Rhythm: Regular rhythm.     Heart sounds: No murmur. No friction rub. No gallop.      Comments: Tachycardia Pulmonary:     Breath sounds: No stridor. No wheezing or rales.  Chest:     Chest wall: No tenderness.  Abdominal:     General: There is no distension.     Tenderness: There is abdominal tenderness. There is no rebound.  Musculoskeletal: Normal range of motion.  Lymphadenopathy:     Cervical: No cervical adenopathy.  Skin:    Findings: No erythema or rash.  Neurological:     Mental Status: She is alert and oriented to person, place, and time.     Motor: No abnormal muscle tone.     Coordination: Coordination normal.  Psychiatric:        Behavior: Behavior normal.      ED Treatments / Results  Labs (all labs ordered are listed, but only abnormal results are displayed) Labs Reviewed  SARS CORONAVIRUS 2 (HOSPITAL ORDER, [facility]) - Abnormal; Notable for the following components:      Result Value   SARS Coronavirus 2 POSITIVE (*)    All other components within normal limits  COMPREHENSIVE METABOLIC PANEL - Abnormal; Notable for the following components:   Potassium 2.7 (*)    CO2 20 (*)    Glucose, Bld 233 (*)    Calcium 8.4 (*)    Albumin 3.4 (*)    AST 46 (*)    ALT 59 (*)    Anion gap 16 (*)    All other components within normal limits  URINALYSIS, ROUTINE W REFLEX MICROSCOPIC - Abnormal; Notable for the following components:   Color, Urine AMBER (*)    APPearance CLOUDY (*)    Hgb urine dipstick MODERATE (*)    Protein, ur 100 (*)    Leukocytes,Ua TRACE (*)    Bacteria, UA RARE (*)    All other components within normal limits  URINE CULTURE  CULTURE,  BLOOD (ROUTINE X 2)  CULTURE, BLOOD (ROUTINE X 2)  CBC WITH DIFFERENTIAL/PLATELET    EKG EKG Interpretation  Date/Time:  Sunday July 08 2018 20:51:52 EDT Ventricular Rate:  119 PR Interval:    QRS Duration: 94 QT Interval:  344 QTC Calculation: 484 R Axis:   -32 Text Interpretation:  Probable left atrial enlargement Low voltage, precordial leads LVH with secondary repolarization abnormality Anterior Q waves, possibly due to LVH Confirmed by Milton Ferguson [phone]) on 07/08/2018 9:00:55 PM   Radiology Dg Chest Portable 1 View  Result Date:  07/08/2018 CLINICAL DATA:  53 year old female with history of shortness of breath. EXAM: PORTABLE CHEST 1 VIEW COMPARISON:  Chest x-ray 07/08/2018. FINDINGS: Widespread interstitial prominence and patchy ill-defined airspace disease scattered throughout the lungs bilaterally. No definite pleural effusions. Pulmonary vasculature is obscured. Heart size is borderline enlarged. Upper mediastinal contours are within normal limits. IMPRESSION: 1. Unusual appearance in the lungs, which based on comparison with recent CT the abdomen and pelvis is favored to reflect widespread cryptogenic organizing pneumonia, likely sequela of recent atypical infection (including viral pneumonia such as COVID-19). Electronically Signed   By: Vinnie Langton M.D.   On: 07/08/2018 20:49   Ct Renal Stone Study  Result Date: 07/08/2018 CLINICAL DATA:  Urinary frequency, dysuria, lower back pain EXAM: CT ABDOMEN AND PELVIS WITHOUT CONTRAST TECHNIQUE: Multidetector CT imaging of the abdomen and pelvis was performed following the standard protocol without IV contrast. COMPARISON:  03/06/2015 FINDINGS: Lower chest: There is extensive, irregular bibasilar opacity and consolidation in a peribronchovascular pattern. Hepatobiliary: No solid liver abnormality is seen. Hepatic steatosis. Status post cholecystectomy. Pancreas: Unremarkable. No pancreatic ductal dilatation or surrounding inflammatory  changes. Spleen: Normal in size without significant abnormality. Adrenals/Urinary Tract: Adrenal glands are unremarkable. There are numerous stacked calculi in the distal left ureter, measuring approximately 4.7 cm in total length, the largest discrete calculus measuring 1.4 cm (series 5, image 65). There is moderate left hydronephrosis and hydroureter. There is an additional nonobstructive calculus in the inferior pole of the left kidney. Bladder is unremarkable. Stomach/Bowel: Stomach is within normal limits. No evidence of bowel wall thickening, distention, or inflammatory changes. Vascular/Lymphatic: No significant vascular findings are present. No enlarged abdominal or pelvic lymph nodes. Reproductive: No mass or other significant abnormality. Other: No abdominal wall hernia or abnormality. No abdominopelvic ascites. Musculoskeletal: No acute or significant osseous findings. IMPRESSION: 1. There are numerous stacked calculi in the distal left ureter, measuring approximately 4.7 cm in total length, the largest discrete calculus measuring 1.4 cm (series 5, image 65). There is moderate left hydronephrosis and hydroureter. There is an additional nonobstructive calculus in the inferior pole of the left kidney. 2. There is extensive, irregular bibasilar opacity and consolidation in a peribronchovascular pattern, of uncertain etiology, possibly acutely infectious or inflammatory although chronic interstitial lung disease is a differential consideration. Recommend nonemergent ILD protocol CT examination of the chest to further evaluate. 3.  Hepatic steatosis. Electronically Signed   By: Eddie Candle M.D.   On: 07/08/2018 18:00    Procedures Procedures (including critical care time)  Medications Ordered in ED Medications  HYDROmorphone (DILAUDID) injection 0.5 mg (0.5 mg Intravenous Not Given 07/08/18 1615)  potassium chloride SA (K-DUR) CR tablet 40 mEq (40 mEq Oral Not Given 07/08/18 1920)  sodium chloride 0.9 %  with cefTRIAXone (ROCEPHIN) ADS Med ( Intravenous Not Given 07/08/18 1752)  azithromycin (ZITHROMAX) 500 mg in sodium chloride 0.9 % 250 mL IVPB (500 mg Intravenous New Bag/Given 07/08/18 2140)  sodium chloride 0.9 % bolus 1,000 mL (0 mLs Intravenous Stopped 07/08/18 1721)  promethazine (PHENERGAN) injection 12.5 mg (12.5 mg Intravenous Given 07/08/18 1615)  sodium chloride 0.9 % bolus 1,000 mL (1,000 mLs Intravenous New Bag/Given 07/08/18 1744)  cefTRIAXone (ROCEPHIN) 1 g in sodium chloride 0.9 % 100 mL IVPB (0 g Intravenous Stopped 07/08/18 1909)  potassium chloride 10 mEq in 100 mL IVPB (0 mEq Intravenous Stopped 07/08/18 2034)  potassium chloride 10 mEq in 100 mL IVPB (0 mEq Intravenous Stopped 07/08/18 1909)  metoCLOPramide (REGLAN) injection 10 mg (  10 mg Intravenous Given 07/08/18 1754)     Initial Impression / Assessment and Plan / ED Course  I have reviewed the triage vital signs and the nursing notes.  Pertinent labs & imaging results that were available during my care of the patient were reviewed by me and considered in my medical decision making (see chart for details).    CRITICAL CARE Performed by: Milton Ferguson Total critical care time:40 minutes Critical care time was exclusive of separately billable procedures and treating other patients. Critical care was necessary to treat or prevent imminent or life-threatening deterioration. Critical care was time spent personally by me on the following activities: development of treatment plan with patient and/or surrogate as well as nursing, discussions with consultants, evaluation of patient's response to treatment, examination of patient, obtaining history from patient or surrogate, ordering and performing treatments and interventions, ordering and review of laboratory studies, ordering and review of radiographic studies, pulse oximetry and re-evaluation of patient's condition.    Myiesha Edgar was evaluated in Emergency Department on 07/08/2018 for  the symptoms described in the history of present illness. She was evaluated in the context of the global COVID-19 pandemic, which necessitated consideration that the patient might be at risk for infection with the SARS-CoV-2 virus that causes COVID-19. Institutional protocols and algorithms that pertain to the evaluation of patients at risk for COVID-19 are in a state of rapid change based on information released by regulatory bodies including the CDC and federal and state organizations. These policies and algorithms were followed during the patient's care in the ED.   Labs show patient has hypokalemia along with urinary tract infection.  Patient also has kidney stones in her right ureter.  CT scan showed questionable infection in her lungs.  Chest x-ray shows atypical infection in her lungs.  Patient was complaining of some shortness of breath but O2 sats never dropped below 91.  [place] test came back positive.  Patient has a urinary tract infection kidney stones hypokalemia and tachycardia with tachypnea from the COVID-19.  She will be admitted to medicine  Final Clinical Impressions(s) / ED Diagnoses   Final diagnoses:  Acute cystitis without hematuria    ED Discharge Orders    None       Milton Ferguson, MD 07/08/18 2148

## 2018-07-08 NOTE — ED Notes (Signed)
CRITICAL VALUE ALERT  Critical Value:  COVID +  Date & Time Notied:  6//08/2018 2123  Provider Notified: Dr. Roderic Palau  Orders Received/Actions taken: see chart

## 2018-07-09 ENCOUNTER — Encounter (HOSPITAL_COMMUNITY): Payer: Self-pay

## 2018-07-09 LAB — COMPREHENSIVE METABOLIC PANEL
ALT: 43 U/L (ref 0–44)
AST: 31 U/L (ref 15–41)
Albumin: 2.8 g/dL — ABNORMAL LOW (ref 3.5–5.0)
Alkaline Phosphatase: 63 U/L (ref 38–126)
Anion gap: 11 (ref 5–15)
BUN: 13 mg/dL (ref 6–20)
CO2: 18 mmol/L — ABNORMAL LOW (ref 22–32)
Calcium: 7.5 mg/dL — ABNORMAL LOW (ref 8.9–10.3)
Chloride: 110 mmol/L (ref 98–111)
Creatinine, Ser: 0.81 mg/dL (ref 0.44–1.00)
GFR calc Af Amer: >60 mL/min (ref >60)
GFR calc non Af Amer: >60 mL/min (ref >60)
Glucose, Bld: 197 mg/dL — ABNORMAL HIGH (ref 70–99)
Potassium: 2.9 mmol/L — ABNORMAL LOW (ref 3.5–5.1)
Sodium: 139 mmol/L (ref 135–145)
Total Bilirubin: 0.3 mg/dL (ref 0.3–1.2)
Total Protein: 6.8 g/dL (ref 6.5–8.1)

## 2018-07-09 LAB — CBC WITH DIFFERENTIAL/PLATELET
Abs Immature Granulocytes: 0.09 10*3/uL — ABNORMAL HIGH (ref 0.00–0.07)
Basophils Absolute: 0 10*3/uL (ref 0.0–0.1)
Basophils Relative: 0 %
Eosinophils Absolute: 0 10*3/uL (ref 0.0–0.5)
Eosinophils Relative: 0 %
HCT: 36.2 % (ref 36.0–46.0)
Hemoglobin: 11.4 g/dL — ABNORMAL LOW (ref 12.0–15.0)
Immature Granulocytes: 1 %
Lymphocytes Relative: 9 %
Lymphs Abs: 0.8 10*3/uL (ref 0.7–4.0)
MCH: 28.6 pg (ref 26.0–34.0)
MCHC: 31.5 g/dL (ref 30.0–36.0)
MCV: 91 fL (ref 80.0–100.0)
Monocytes Absolute: 0.4 10*3/uL (ref 0.1–1.0)
Monocytes Relative: 4 %
Neutro Abs: 7.7 10*3/uL (ref 1.7–7.7)
Neutrophils Relative %: 86 %
Platelets: 316 10*3/uL (ref 150–400)
RBC: 3.98 MIL/uL (ref 3.87–5.11)
RDW: 13.7 % (ref 11.5–15.5)
WBC: 9 10*3/uL (ref 4.0–10.5)
nRBC: 0 % (ref 0.0–0.2)

## 2018-07-09 LAB — CBG MONITORING, ED: Glucose-Capillary: 158 mg/dL — ABNORMAL HIGH (ref 70–99)

## 2018-07-09 LAB — GLUCOSE, CAPILLARY
Glucose-Capillary: 157 mg/dL — ABNORMAL HIGH (ref 70–99)
Glucose-Capillary: 170 mg/dL — ABNORMAL HIGH (ref 70–99)
Glucose-Capillary: 183 mg/dL — ABNORMAL HIGH (ref 70–99)
Glucose-Capillary: 301 mg/dL — ABNORMAL HIGH (ref 70–99)
Glucose-Capillary: 274 mg/dL — ABNORMAL HIGH (ref 70–99)

## 2018-07-09 LAB — URINE CULTURE: Culture: 30000 — AB

## 2018-07-09 LAB — MAGNESIUM: Magnesium: 1.7 mg/dL (ref 1.7–2.4)

## 2018-07-09 LAB — PHOSPHORUS: Phosphorus: 1.6 mg/dL — ABNORMAL LOW (ref 2.5–4.6)

## 2018-07-09 LAB — PROTIME-INR
INR: 1 (ref 0.8–1.2)
Prothrombin Time: 13.3 seconds (ref 11.4–15.2)

## 2018-07-09 LAB — CK TOTAL AND CKMB (NOT AT ARMC)
CK, MB: 3.5 ng/mL (ref 0.5–5.0)
Relative Index: INVALID (ref 0.0–2.5)
Total CK: 32 U/L — ABNORMAL LOW (ref 38–234)

## 2018-07-09 LAB — C-REACTIVE PROTEIN: CRP: 16.6 mg/dL — ABNORMAL HIGH (ref <1.0)

## 2018-07-09 MED ORDER — PROMETHAZINE HCL 25 MG/ML IJ SOLN
12.5000 mg | Freq: Four times a day (QID) | INTRAMUSCULAR | Status: DC | PRN
  Administered 2018-07-09: 12.5 mg via INTRAVENOUS
  Filled 2018-07-09: qty 1

## 2018-07-09 MED ORDER — NORGESTREL-ETHINYL ESTRADIOL 0.3-30 MG-MCG PO TABS: 1.0000 | ORAL_TABLET | Freq: Every day | ORAL | Status: DC

## 2018-07-09 MED ORDER — SODIUM CHLORIDE 0.9 % IV SOLN
200.0000 mg | Freq: Once | INTRAVENOUS | Status: AC
  Administered 2018-07-09: 200 mg via INTRAVENOUS
  Filled 2018-07-09: qty 40

## 2018-07-09 MED ORDER — METHYLPREDNISOLONE SODIUM SUCC 125 MG IJ SOLR
60.0000 mg | Freq: Two times a day (BID) | INTRAMUSCULAR | Status: DC
  Administered 2018-07-09 – 2018-07-12 (×7): 60 mg via INTRAVENOUS
  Filled 2018-07-09 (×7): qty 2

## 2018-07-09 MED ORDER — INSULIN ASPART 100 UNIT/ML ~~LOC~~ SOLN
0.0000 [IU] | Freq: Three times a day (TID) | SUBCUTANEOUS | Status: DC
  Administered 2018-07-09: 7 [IU] via SUBCUTANEOUS
  Administered 2018-07-10: 5 [IU] via SUBCUTANEOUS

## 2018-07-09 MED ORDER — SODIUM CHLORIDE 0.9 % IV SOLN
INTRAVENOUS | Status: DC
  Administered 2018-07-09 – 2018-07-11 (×7): via INTRAVENOUS

## 2018-07-09 MED ORDER — SODIUM CHLORIDE 0.9 % IV SOLN
1.0000 g | INTRAVENOUS | Status: DC
  Administered 2018-07-09 – 2018-07-14 (×6): 1 g via INTRAVENOUS
  Filled 2018-07-09: qty 10
  Filled 2018-07-09 (×2): qty 1
  Filled 2018-07-09: qty 10
  Filled 2018-07-09: qty 1
  Filled 2018-07-09: qty 10

## 2018-07-09 MED ORDER — METOPROLOL SUCCINATE ER 50 MG PO TB24
50.0000 mg | ORAL_TABLET | Freq: Every day | ORAL | Status: DC
  Administered 2018-07-09 – 2018-07-14 (×6): 50 mg via ORAL
  Filled 2018-07-09 (×3): qty 1
  Filled 2018-07-09 (×3): qty 2

## 2018-07-09 MED ORDER — HYDROMORPHONE HCL 1 MG/ML IJ SOLN
0.5000 mg | Freq: Four times a day (QID) | INTRAMUSCULAR | Status: DC | PRN
  Administered 2018-07-09 – 2018-07-10 (×4): 1 mg via INTRAVENOUS
  Filled 2018-07-09 (×4): qty 1

## 2018-07-09 MED ORDER — PANTOPRAZOLE SODIUM 40 MG PO TBEC
40.0000 mg | DELAYED_RELEASE_TABLET | Freq: Every day | ORAL | Status: DC
  Administered 2018-07-09 – 2018-07-14 (×6): 40 mg via ORAL
  Filled 2018-07-09 (×6): qty 1

## 2018-07-09 MED ORDER — SODIUM CHLORIDE 0.9 % IV SOLN
100.0000 mg | INTRAVENOUS | Status: AC
  Administered 2018-07-10 – 2018-07-13 (×4): 100 mg via INTRAVENOUS
  Filled 2018-07-09 (×4): qty 20

## 2018-07-09 MED ORDER — MUPIROCIN 2 % EX OINT
1.0000 "application " | TOPICAL_OINTMENT | Freq: Two times a day (BID) | CUTANEOUS | Status: DC
  Administered 2018-07-09 – 2018-07-11 (×3): 1 via NASAL
  Filled 2018-07-09 (×2): qty 22

## 2018-07-09 MED ORDER — POTASSIUM CHLORIDE CRYS ER 20 MEQ PO TBCR
40.0000 meq | EXTENDED_RELEASE_TABLET | ORAL | Status: AC
  Administered 2018-07-09 (×2): 40 meq via ORAL
  Filled 2018-07-09 (×2): qty 2

## 2018-07-09 NOTE — Consult Note (Signed)
Urology Consult Note   Requesting Attending Physician:  Dessa Phi, DO Service Providing Consult: Urology  Consulting Attending: Ellison Hughs, MD   Reason for Consult:  Obstructing ureteral stones  HPI: Leah Farmer is seen in consultation for reasons noted above at the request of Dessa Phi, DO for evaluation of obstructing left ureteral stones.  This is a 53 y.o. female with a history of DM, HTN, NAFL, and nephrolithiasis who presented to an OSH ED with right groin pain and nausea, and shortness of breath. She reports she has been experiencing lower back pain wrapping around to both sides for the past week. She was having emesis earlier this week which resolved. She experienced persistent nausea until the ED last night. She denies hematuria, fevers, chills.   She has a history of kidney stones but has never required surgery for them. She has passed stones before. CT scan shows large left distal ureteral stone burden with mild hydronephrosis.   In the ED, she was also experiencing shortness of breath and cough and tested COVID positive. She has remained tachycardic and tachypnic upon transfer.    Past Medical History: Past Medical History:  Diagnosis Date  . Diabetes mellitus without complication ([place])   . Hypertension   . NAFL (nonalcoholic fatty liver) 38/10/1749   fatty liver seen on CT scan abd/ pelvis  . Nephrolithiasis   . Tachycardia     Past Surgical History:  Past Surgical History:  Procedure Laterality Date  . APPENDECTOMY    . CHOLECYSTECTOMY      Medication: Current Facility-Administered Medications  Medication Dose Route Frequency Provider Last Rate Last Dose  . 0.9 % NaCl with KCl 20 mEq/ L  infusion   Intravenous Continuous Jani Gravel, MD 100 mL/hr at 07/09/18 0706    . HYDROmorphone (DILAUDID) injection 0.5 mg  0.5 mg Intravenous Once Jani Gravel, MD      . HYDROmorphone (DILAUDID) injection 0.5 mg  0.5 mg Intravenous Q6H PRN Jani Gravel, MD       . insulin aspart (novoLOG) injection 0-9 Units  0-9 Units Subcutaneous Q4H Jani Gravel, MD   2 Units at 07/09/18 0340  . methylPREDNISolone sodium succinate (SOLU-MEDROL) 125 mg/2 mL injection 60 mg  60 mg Intravenous Q12H Dessa Phi, DO      . metoprolol succinate (TOPROL-XL) 24 hr tablet 50 mg  50 mg Oral Daily Jani Gravel, MD      . norgestrel-ethinyl estradiol (LO/OVRAL) 0.3-30 MG-MCG per tablet 1 tablet  1 tablet Oral Daily Jani Gravel, MD      . ondansetron [facility]) injection 4 mg  4 mg Intravenous Q6H PRN Jani Gravel, MD   4 mg at 07/09/18 0350  . pantoprazole (PROTONIX) EC tablet 40 mg  40 mg Oral Daily Jani Gravel, MD      . potassium chloride SA (K-DUR) CR tablet 40 mEq  40 mEq Oral Once Jani Gravel, MD        Allergies: Allergies  Allergen Reactions  . Ciprofloxacin Hives    Social History: Social History   Tobacco Use  . Smoking status: Never Smoker  . Smokeless tobacco: Never Used  Substance Use Topics  . Alcohol use: Never    Frequency: Never  . Drug use: Never    Family History Family History  Problem Relation Age of Onset  . Hypertension Mother   . Diabetes Mother   . Hypertension Father   . Diabetes Father     Review of Systems 10 systems were reviewed and  are negative except as noted specifically in the HPI.  Objective   Vital signs in last 24 hours: BP (!) 143/90 (BP Location: Left Arm)   Pulse (!) 115   Temp 99.3 F (37.4 C) (Oral)   Resp (!) 24   Ht 5\' 4"  (1.626 m)   Wt 113.4 kg   SpO2 97%   BMI 42.91 kg/m   Physical Exam General: NAD, A&O, resting, appropriate HEENT: [place]/AT, EOMI, MMM Pulmonary: Normal work of breathing Cardiovascular: HDS, adequate peripheral perfusion Abdomen: Soft, NTTP, nondistended. GU: No CVA tenderness bilaterally Extremities: warm and well perfused Neuro: Appropriate, no focal neurological deficits  Most Recent Labs: Lab Results  Component Value Date   WBC 9.4 07/08/2018   HGB 12.5 07/08/2018   HCT  38.7 07/08/2018   PLT 345 07/08/2018    Lab Results  Component Value Date   NA 137 07/08/2018   K 2.7 (LL) 07/08/2018   CL 101 07/08/2018   CO2 20 (L) 07/08/2018   BUN 16 07/08/2018   CREATININE 0.95 07/08/2018   CALCIUM 8.4 (L) 07/08/2018    No results found for: INR, APTT   IMAGING: Dg Chest Portable 1 View  Result Date: 07/08/2018 CLINICAL DATA:  53 year old female with history of shortness of breath. EXAM: PORTABLE CHEST 1 VIEW COMPARISON:  Chest x-ray 07/08/2018. FINDINGS: Widespread interstitial prominence and patchy ill-defined airspace disease scattered throughout the lungs bilaterally. No definite pleural effusions. Pulmonary vasculature is obscured. Heart size is borderline enlarged. Upper mediastinal contours are within normal limits. IMPRESSION: 1. Unusual appearance in the lungs, which based on comparison with recent CT the abdomen and pelvis is favored to reflect widespread cryptogenic organizing pneumonia, likely sequela of recent atypical infection (including viral pneumonia such as COVID-19). Electronically Signed   By: Vinnie Langton M.D.   On: 07/08/2018 20:49   Ct Renal Stone Study  Result Date: 07/08/2018 CLINICAL DATA:  Urinary frequency, dysuria, lower back pain EXAM: CT ABDOMEN AND PELVIS WITHOUT CONTRAST TECHNIQUE: Multidetector CT imaging of the abdomen and pelvis was performed following the standard protocol without IV contrast. COMPARISON:  03/06/2015 FINDINGS: Lower chest: There is extensive, irregular bibasilar opacity and consolidation in a peribronchovascular pattern. Hepatobiliary: No solid liver abnormality is seen. Hepatic steatosis. Status post cholecystectomy. Pancreas: Unremarkable. No pancreatic ductal dilatation or surrounding inflammatory changes. Spleen: Normal in size without significant abnormality. Adrenals/Urinary Tract: Adrenal glands are unremarkable. There are numerous stacked calculi in the distal left ureter, measuring approximately 4.7 cm  in total length, the largest discrete calculus measuring 1.4 cm (series 5, image 65). There is moderate left hydronephrosis and hydroureter. There is an additional nonobstructive calculus in the inferior pole of the left kidney. Bladder is unremarkable. Stomach/Bowel: Stomach is within normal limits. No evidence of bowel wall thickening, distention, or inflammatory changes. Vascular/Lymphatic: No significant vascular findings are present. No enlarged abdominal or pelvic lymph nodes. Reproductive: No mass or other significant abnormality. Other: No abdominal wall hernia or abnormality. No abdominopelvic ascites. Musculoskeletal: No acute or significant osseous findings. IMPRESSION: 1. There are numerous stacked calculi in the distal left ureter, measuring approximately 4.7 cm in total length, the largest discrete calculus measuring 1.4 cm (series 5, image 65). There is moderate left hydronephrosis and hydroureter. There is an additional nonobstructive calculus in the inferior pole of the left kidney. 2. There is extensive, irregular bibasilar opacity and consolidation in a peribronchovascular pattern, of uncertain etiology, possibly acutely infectious or inflammatory although chronic interstitial lung disease is a differential consideration. Recommend  nonemergent ILD protocol CT examination of the chest to further evaluate. 3.  Hepatic steatosis. Electronically Signed   By: Eddie Candle M.D.   On: 07/08/2018 18:00    ------  Assessment:  53 y.o. female with a history of DM, HTN, NAFL, and nephrolithiasis who is COVID positive and has a large symptomatic left distal ureteral stone burden measuring 4.7 cm in distal to proximal extent with largest distal stone 1.4 cm in diameter.   Discussed management options with the patient. Given she is symptomatic from her kidney stones, we recommend decompression of the left renal collecting system. We discussed that possible ways to decompress the left kidney include  placement of a left ureteral stent vs nephrostomy tube. Given the extent of the patient's stone burden, stent placement is likely to be unsuccessful. Therefore, we recommended nephrostomy tube placement with VIR and patient is in agreement. However, given her tachypnea and tachycardia, patient would like to defer placement until tomorrow. Given she is afebrile with normal creatinine, this is reasonable at this time. However, if she becomes febrile, would recommend proceeding with emergent placement of left PCN.   Recommendations: - Placement of left PCN with VIR tomorrow, 07/10/18. Discussed with VIR. - Please make patient NPO at midnight. - Do not start anticoagulation given planned procedure.  - Continue ceftriaxone.  - Please send urine culture at time of nephrostomy tube placement. - Patient will require definitive stone surgery in the future. Urology will arrange.   Discussed with Dr. Lovena Neighbours.   Thank you for this consult. Please contact the urology consult pager with any further questions/concerns.

## 2018-07-09 NOTE — Progress Notes (Signed)
PROGRESS NOTE    Leah Farmer  AYT:[id number] DOB: Nov 30, 1965 DOA: 07/08/2018 PCP: Margy Clarks, NP     Brief Narrative:  Leah Farmer is a 53 year old female with past medical history significant for type 2 diabetes, history of nephrolithiasis who presented to the hospital with chief complaint of bilateral flank pain radiating to her groin since this past Monday.  She also admitted to some nausea, vomiting without any fevers or chills.  She has had a dry cough and some shortness of breath.  In the emergency department, CT renal stone revealed left nephrolithiasis with moderate left hydronephrosis and hydroureter.  COVID testing was positive.  Patient transferred to [facility] for urological evaluation.  New events last 24 hours / Subjective: Patient complains of bilateral flank pain radiating to her left lower abdomen and down into her groin.  Some nausea today.  Pain and nausea are well controlled with IV make medications.  Assessment & Plan:   Principal Problem:   COVID-19 virus infection Active Problems:   Nausea & vomiting   Acute flank pain   Flank pain   Nephrolithiasis   Hydronephrosis, left   Acute lower UTI   Tachycardia   CAP (community acquired pneumonia)   Hypokalemia   Abnormal liver function   Left nephrolithiasis with left hydronephrosis -Urology consulted -Plan for left nephrostomy tube placement 6/9 -Urine culture pending -Blood culture pending -Continue Rocephin  COVID-19 positive -Currently requiring 2L [place] O2  -Chest x-ray consistent with COVID infection -Ferritin elevated 461, CRP elevated 16.6, sed rate elevated 65, d-dimer elevated 0.95 procalcitonin negative, -Started on IV Solu-Medrol 60 mg every 12h, Remdesivir  Acute hypoxemic respiratory failure -Due to COVID-19  Hypokalemia -Replace, trend  DM type 2 -SSI    DVT prophylaxis: SCD Code Status: Full code Family Communication: None Disposition Plan: Pending further  procedure, treatment of left nephrolithiasis and treatment of COVID-19.  Once stable from urological standpoint and no further procedures planned, transfer to [place] for treatment of COVID-19 and respiratory failure   Consultants:   Urology  Procedures:   None  Antimicrobials:  Anti-infectives (From admission, onward)   Start     Dose/Rate Route Frequency Ordered Stop   07/10/18 1000  remdesivir 100 mg in sodium chloride 0.9 % 230 mL IVPB     100 mg 500 mL/hr over 30 Minutes Intravenous Every 24 hours 07/09/18 0823 07/14/18 0959   07/09/18 1000  remdesivir 200 mg in sodium chloride 0.9 % 210 mL IVPB     200 mg 500 mL/hr over 30 Minutes Intravenous Once 07/09/18 0823 07/09/18 1258   07/09/18 0900  cefTRIAXone (ROCEPHIN) 1 g in sodium chloride 0.9 % 100 mL IVPB     1 g 200 mL/hr over 30 Minutes Intravenous Every 24 hours 07/09/18 0748     07/08/18 2100  azithromycin (ZITHROMAX) 500 mg in sodium chloride 0.9 % 250 mL IVPB     500 mg 250 mL/hr over 60 Minutes Intravenous  Once 07/08/18 2058 07/08/18 2304   07/08/18 1730  cefTRIAXone (ROCEPHIN) 1 g in sodium chloride 0.9 % 100 mL IVPB     1 g 200 mL/hr over 30 Minutes Intravenous  Once 07/08/18 1719 07/08/18 1909        Objective: Vitals:   07/09/18 0455 07/09/18 0510 07/09/18 0906 07/09/18 1158  BP: (!) 143/90  137/85 136/70  Pulse: (!) 115  (!) 103 89  Resp: (!) 24   (!) 25  Temp: 99.3 F (37.4 C)  99.3 F (37.4 C) 99.1 F (37.3 C)  TempSrc: Oral  Oral Oral  SpO2: 96% 97% 96% 96%  Weight:  113.4 kg    Height:  5\' 4"  (1.626 m)      Intake/Output Summary (Last 24 hours) at 07/09/2018 1322 Last data filed at 07/09/2018 0600 Gross per 24 hour  Intake 1678.1 ml  Output --  Net 1678.1 ml   Filed Weights   07/08/18 1546 07/09/18 0510  Weight: 113.4 kg 113.4 kg    Examination:  General exam: Appears calm and comfortable  Respiratory system: Clear to auscultation. Respiratory effort normal. Cardiovascular  system: S1 & S2 heard, tachycardic, regular rhythm. No JVD, murmurs, rubs, gallops or clicks. No pedal edema. Gastrointestinal system: Abdomen is nondistended, soft and nontender. No organomegaly or masses felt. Normal bowel sounds heard. Central nervous system: Alert and oriented. No focal neurological deficits. Extremities: Symmetric 5 x 5 power. Skin: No rashes, lesions or ulcers Psychiatry: Judgement and insight appear normal. Mood & affect appropriate.   Data Reviewed: I have personally reviewed following labs and imaging studies  CBC: Recent Labs  Lab 07/08/18 1610 07/09/18 1037  WBC 9.4 9.0  NEUTROABS 7.5 7.7  HGB 12.5 11.4*  HCT 38.7 36.2  MCV 87.0 91.0  PLT 345 846   Basic Metabolic Panel: Recent Labs  Lab 07/08/18 1610 07/09/18 1037  NA 137 139  K 2.7* 2.9*  CL 101 110  CO2 20* 18*  GLUCOSE 233* 197*  BUN 16 13  CREATININE 0.95 0.81  CALCIUM 8.4* 7.5*  MG  --  1.7  PHOS  --  1.6*   GFR: Estimated Creatinine Clearance: 101.4 mL/min (by C-G formula based on SCr of 0.81 mg/dL). Liver Function Tests: Recent Labs  Lab 07/08/18 1610 07/09/18 1037  AST 46* 31  ALT 59* 43  ALKPHOS 78 63  BILITOT 0.5 0.3  PROT 8.1 6.8  ALBUMIN 3.4* 2.8*   No results for input(s): LIPASE, AMYLASE in the last 168 hours. No results for input(s): AMMONIA in the last 168 hours. Coagulation Profile: Recent Labs  Lab 07/09/18 1037  INR 1.0   Cardiac Enzymes: Recent Labs  Lab 07/08/18 1616  CKTOTAL 32*  CKMB 3.5   BNP (last 3 results) No results for input(s): PROBNP in the last 8760 hours. HbA1C: No results for input(s): HGBA1C in the last 72 hours. CBG: Recent Labs  Lab 07/08/18 2348 07/09/18 0317 07/09/18 0457 07/09/18 0911 07/09/18 1155  GLUCAP 171* 158* 157* 170* 183*   Lipid Profile: No results for input(s): CHOL, HDL, LDLCALC, TRIG, CHOLHDL, LDLDIRECT in the last 72 hours. Thyroid Function Tests: No results for input(s): TSH, T4TOTAL, FREET4, T3FREE,  THYROIDAB in the last 72 hours. Anemia Panel: Recent Labs    07/08/18 1616  FERRITIN 461*   Sepsis Labs: Recent Labs  Lab 07/08/18 1616  PROCALCITON <0.10    Recent Results (from the past 240 hour(s))  SARS Coronavirus 2 (CEPHEID- Performed in [place] hospital lab), Hosp Order     Status: Abnormal   Collection Time: 07/08/18  6:21 PM  Result Value Ref Range Status   SARS Coronavirus 2 POSITIVE (A) NEGATIVE Final    Comment: RESULT CALLED TO, READ BACK BY AND VERIFIED WITH: DOSS,M @ 2116 ON 07/08/18 BY JUW (NOTE) If result is NEGATIVE SARS-CoV-2 target nucleic acids are NOT DETECTED. The SARS-CoV-2 RNA is generally detectable in upper and lower  respiratory specimens during the acute phase of infection. The lowest  concentration of SARS-CoV-2 viral  copies this assay can detect is 250  copies / mL. A negative result does not preclude SARS-CoV-2 infection  and should not be used as the sole basis for treatment or other  patient management decisions.  A negative result may occur with  improper specimen collection / handling, submission of specimen other  than nasopharyngeal swab, presence of viral mutation(s) within the  areas targeted by this assay, and inadequate number of viral copies  (<250 copies / mL). A negative result must be combined with clinical  observations, patient history, and epidemiological information. If result is POSITIVE SARS-CoV-2 target nucleic acids are DETECTED. The SA RS-CoV-2 RNA is generally detectable in upper and lower  respiratory specimens during the acute phase of infection.  Positive  results are indicative of active infection with SARS-CoV-2.  Clinical  correlation with patient history and other diagnostic information is  necessary to determine patient infection status.  Positive results do  not rule out bacterial infection or co-infection with other viruses. If result is PRESUMPTIVE POSTIVE SARS-CoV-2 nucleic acids MAY BE PRESENT.   A  presumptive positive result was obtained on the submitted specimen  and confirmed on repeat testing.  While 2019 novel coronavirus  (SARS-CoV-2) nucleic acids may be present in the submitted sample  additional confirmatory testing may be necessary for epidemiological  and / or clinical management purposes  to differentiate between  SARS-CoV-2 and other Sarbecovirus currently known to infect humans.  If clinically indicated additional testing with an alternate test  methodology [id number]) is advi sed. The SARS-CoV-2 RNA is generally  detectable in upper and lower respiratory specimens during the acute  phase of infection. The expected result is Negative. Fact Sheet for Patients:  StrictlyIdeas.no Fact Sheet for Healthcare Providers: BankingDealers.co.za This test is not yet approved or cleared by the [place] FDA and has been authorized for detection and/or diagnosis of SARS-CoV-2 by FDA under an Emergency Use Authorization (EUA).  This EUA will remain in effect (meaning this test can be used) for the duration of the COVID-19 declaration under Section 564(b)(1) of the Act, 21 U.S.C. section 360bbb-3(b)(1), unless the authorization is terminated or revoked sooner. Performed at [facility], [address], [place], [place] [address]        Radiology Studies: Dg Chest Portable 1 View  Result Date: 07/08/2018 CLINICAL DATA:  53 year old female with history of shortness of breath. EXAM: PORTABLE CHEST 1 VIEW COMPARISON:  Chest x-ray 07/08/2018. FINDINGS: Widespread interstitial prominence and patchy ill-defined airspace disease scattered throughout the lungs bilaterally. No definite pleural effusions. Pulmonary vasculature is obscured. Heart size is borderline enlarged. Upper mediastinal contours are within normal limits. IMPRESSION: 1. Unusual appearance in the lungs, which based on comparison with recent CT the abdomen and pelvis is favored  to reflect widespread cryptogenic organizing pneumonia, likely sequela of recent atypical infection (including viral pneumonia such as COVID-19). Electronically Signed   By: Vinnie Langton M.D.   On: 07/08/2018 20:49   Ct Renal Stone Study  Result Date: 07/08/2018 CLINICAL DATA:  Urinary frequency, dysuria, lower back pain EXAM: CT ABDOMEN AND PELVIS WITHOUT CONTRAST TECHNIQUE: Multidetector CT imaging of the abdomen and pelvis was performed following the standard protocol without IV contrast. COMPARISON:  03/06/2015 FINDINGS: Lower chest: There is extensive, irregular bibasilar opacity and consolidation in a peribronchovascular pattern. Hepatobiliary: No solid liver abnormality is seen. Hepatic steatosis. Status post cholecystectomy. Pancreas: Unremarkable. No pancreatic ductal dilatation or surrounding inflammatory changes. Spleen: Normal in size without significant abnormality. Adrenals/Urinary Tract: Adrenal  glands are unremarkable. There are numerous stacked calculi in the distal left ureter, measuring approximately 4.7 cm in total length, the largest discrete calculus measuring 1.4 cm (series 5, image 65). There is moderate left hydronephrosis and hydroureter. There is an additional nonobstructive calculus in the inferior pole of the left kidney. Bladder is unremarkable. Stomach/Bowel: Stomach is within normal limits. No evidence of bowel wall thickening, distention, or inflammatory changes. Vascular/Lymphatic: No significant vascular findings are present. No enlarged abdominal or pelvic lymph nodes. Reproductive: No mass or other significant abnormality. Other: No abdominal wall hernia or abnormality. No abdominopelvic ascites. Musculoskeletal: No acute or significant osseous findings. IMPRESSION: 1. There are numerous stacked calculi in the distal left ureter, measuring approximately 4.7 cm in total length, the largest discrete calculus measuring 1.4 cm (series 5, image 65). There is moderate left  hydronephrosis and hydroureter. There is an additional nonobstructive calculus in the inferior pole of the left kidney. 2. There is extensive, irregular bibasilar opacity and consolidation in a peribronchovascular pattern, of uncertain etiology, possibly acutely infectious or inflammatory although chronic interstitial lung disease is a differential consideration. Recommend nonemergent ILD protocol CT examination of the chest to further evaluate. 3.  Hepatic steatosis. Electronically Signed   By: Eddie Candle M.D.   On: 07/08/2018 18:00      Scheduled Meds:   HYDROmorphone (DILAUDID) injection  0.5 mg Intravenous Once   insulin aspart  0-9 Units Subcutaneous Q4H   methylPREDNISolone (SOLU-MEDROL) injection  60 mg Intravenous Q12H   metoprolol succinate  50 mg Oral Daily   norgestrel-ethinyl estradiol  1 tablet Oral Daily   pantoprazole  40 mg Oral Daily   potassium chloride  40 mEq Oral Q4H   Continuous Infusions:  sodium chloride 100 mL/hr at 07/09/18 1227   cefTRIAXone (ROCEPHIN)  IV 1 g (07/09/18 0950)   [START ON 07/10/2018] remdesivir 100 mg in NS 250 mL       LOS: 1 day    Time spent: 45 minutes   Dessa Phi, DO [facility] [email or web address] 07/09/2018, 1:22 PM

## 2018-07-09 NOTE — Progress Notes (Signed)
I spoke with the patient on the phone concerning her CT findings.  Given the large amount of stone burden in the distal left ureter, she will need to undergo percutaneous nephrostomy tube placement tomorrow.  She voices understanding.

## 2018-07-09 NOTE — Consult Note (Signed)
Chief Complaint: Patient was seen in consultation today for left percutaneous nephrostomy  Referring Physician(s): [place]  Supervising Physician: Aletta Edouard  Patient Status: [facility] - In-pt  History of Present Illness: Leah Farmer is a 53 y.o. female with history of diabetes, hypertension, fatty liver disease who was recently transferred to [facility] from [facility] secondary to bilateral flank pain, weakness, mild dysuria, nausea, vomiting, dry cough, mild dyspnea and imaging findings of numerous stacked calculi of the distal left ureter with associated mild to moderate left hydronephrosis and hydroureter.  There was also an additional nonobstructing calculus in the inferior pole left kidney. She is COVD- 19 positive.  Current labs include normal WBC, hemoglobin 11.4, platelets 316k, potassium 2.9, creatinine 0.81, PT 13.3, INR 1.0, urine culture pending.  Temp 99.1, BP 136/70, heart rate 89, O2 sats 96% 2 L nasal cannula.  Request now received from urology for left percutaneous nephrostomy. Past Medical History:  Diagnosis Date   Diabetes mellitus without complication ([place])    Hypertension    NAFL (nonalcoholic fatty liver) 39/76/7341   fatty liver seen on CT scan abd/ pelvis   Nephrolithiasis    Tachycardia     Past Surgical History:  Procedure Laterality Date   APPENDECTOMY     CHOLECYSTECTOMY      Allergies: Ciprofloxacin  Medications: Prior to Admission medications   Medication Sig Start Date End Date Taking? Authorizing Provider  clonazePAM (KLONOPIN) 0.5 MG tablet Take 0.25-0.5 mg by mouth daily as needed for anxiety.   Yes [provider]  ibuprofen (ADVIL) 200 MG tablet Take 200-800 mg by mouth every 6 (six) hours as needed for mild pain or moderate pain.   Yes [provider]  lisinopril-hydrochlorothiazide (ZESTORETIC) 20-12.5 MG tablet Take 1 tablet by mouth daily.   Yes [provider]  meclizine  (ANTIVERT) 12.5 MG tablet Take 12.5 mg by mouth 3 (three) times daily as needed for dizziness.   Yes [provider]  metoprolol succinate (TOPROL-XL) 50 MG 24 hr tablet Take 50 mg by mouth daily. Take with or immediately following a meal.   Yes [provider]  norgestrel-ethinyl estradiol (CRYSELLE-28) 0.3-30 MG-MCG tablet Take 1 tablet by mouth daily.   Yes [provider]  omeprazole (PRILOSEC) 20 MG capsule Take 20 mg by mouth every morning.   Yes [provider]  rosuvastatin (CRESTOR) 10 MG tablet Take 10 mg by mouth at bedtime.   Yes [provider]  SitaGLIPtin-MetFORMIN HCl (JANUMET XR) [phone] MG TB24 Take 1 tablet by mouth every evening.   Yes [provider]  Vitamin D, Ergocalciferol, (DRISDOL) 1.25 MG (50000 UT) CAPS capsule Take 50,000 Units by mouth every Saturday.   Yes [provider]     Family History  Problem Relation Age of Onset   Hypertension Mother    Diabetes Mother    Hypertension Father    Diabetes Father     Social History   Socioeconomic History   Marital status: Married    Spouse name: Not on file   Number of children: Not on file   Years of education: Not on file   Highest education level: Not on file  Occupational History   Not on file  Social Needs   Financial resource strain: Not on file   Food insecurity:    Worry: Not on file    Inability: Not on file   Transportation needs:    Medical: Not on file    Non-medical: Not  on file  Tobacco Use   Smoking status: Never Smoker   Smokeless tobacco: Never Used  Substance and Sexual Activity   Alcohol use: Never    Frequency: Never   Drug use: Never   Sexual activity: Not on file  Lifestyle   Physical activity:    Days per week: Not on file    Minutes per session: Not on file   Stress: Not on file  Relationships   Social connections:    Talks on phone: Not on file    Gets together: Not on file    Attends  religious service: Not on file    Active member of club or organization: Not on file    Attends meetings of clubs or organizations: Not on file    Relationship status: Not on file  Other Topics Concern   Not on file  Social History Narrative   Not on file      Review of Systems see above  Vital Signs: BP 136/70 (BP Location: Right Arm)    Pulse 89    Temp 99.1 F (37.3 C) (Oral)    Resp (!) 25 Comment: I recount it because pt is talking on the first one   Ht 5\' 4"  (1.626 m)    Wt 250 lb (113.4 kg)    SpO2 96%    BMI 42.91 kg/m   Physical Exam (per IM note today) General exam: Appears calm and comfortable  Respiratory system: Clear to auscultation. Respiratory effort normal. Cardiovascular system: S1 & S2 heard, tachycardic, regular rhythm. No JVD, murmurs, rubs, gallops or clicks. No pedal edema. Gastrointestinal system: Abdomen is nondistended, soft and nontender. No organomegaly or masses felt. Normal bowel sounds heard. Central nervous system: Alert and oriented. No focal neurological deficits. Extremities: Symmetric 5 x 5 power. Skin: No rashes, lesions or ulcers Psychiatry: Judgement and insight appear normal. Mood & affect appropriate  Imaging: Dg Chest Portable 1 View  Result Date: 07/08/2018 CLINICAL DATA:  53 year old female with history of shortness of breath. EXAM: PORTABLE CHEST 1 VIEW COMPARISON:  Chest x-ray 07/08/2018. FINDINGS: Widespread interstitial prominence and patchy ill-defined airspace disease scattered throughout the lungs bilaterally. No definite pleural effusions. Pulmonary vasculature is obscured. Heart size is borderline enlarged. Upper mediastinal contours are within normal limits. IMPRESSION: 1. Unusual appearance in the lungs, which based on comparison with recent CT the abdomen and pelvis is favored to reflect widespread cryptogenic organizing pneumonia, likely sequela of recent atypical infection (including viral pneumonia such as COVID-19).  Electronically Signed   By: Vinnie Langton M.D.   On: 07/08/2018 20:49   Ct Renal Stone Study  Result Date: 07/08/2018 CLINICAL DATA:  Urinary frequency, dysuria, lower back pain EXAM: CT ABDOMEN AND PELVIS WITHOUT CONTRAST TECHNIQUE: Multidetector CT imaging of the abdomen and pelvis was performed following the standard protocol without IV contrast. COMPARISON:  03/06/2015 FINDINGS: Lower chest: There is extensive, irregular bibasilar opacity and consolidation in a peribronchovascular pattern. Hepatobiliary: No solid liver abnormality is seen. Hepatic steatosis. Status post cholecystectomy. Pancreas: Unremarkable. No pancreatic ductal dilatation or surrounding inflammatory changes. Spleen: Normal in size without significant abnormality. Adrenals/Urinary Tract: Adrenal glands are unremarkable. There are numerous stacked calculi in the distal left ureter, measuring approximately 4.7 cm in total length, the largest discrete calculus measuring 1.4 cm (series 5, image 65). There is moderate left hydronephrosis and hydroureter. There is an additional nonobstructive calculus in the inferior pole of the left kidney. Bladder is unremarkable. Stomach/Bowel: Stomach is within normal limits. No  evidence of bowel wall thickening, distention, or inflammatory changes. Vascular/Lymphatic: No significant vascular findings are present. No enlarged abdominal or pelvic lymph nodes. Reproductive: No mass or other significant abnormality. Other: No abdominal wall hernia or abnormality. No abdominopelvic ascites. Musculoskeletal: No acute or significant osseous findings. IMPRESSION: 1. There are numerous stacked calculi in the distal left ureter, measuring approximately 4.7 cm in total length, the largest discrete calculus measuring 1.4 cm (series 5, image 65). There is moderate left hydronephrosis and hydroureter. There is an additional nonobstructive calculus in the inferior pole of the left kidney. 2. There is extensive,  irregular bibasilar opacity and consolidation in a peribronchovascular pattern, of uncertain etiology, possibly acutely infectious or inflammatory although chronic interstitial lung disease is a differential consideration. Recommend nonemergent ILD protocol CT examination of the chest to further evaluate. 3.  Hepatic steatosis. Electronically Signed   By: Eddie Candle M.D.   On: 07/08/2018 18:00    Labs:  CBC: Recent Labs    07/08/18 1610 07/09/18 1037  WBC 9.4 9.0  HGB 12.5 11.4*  HCT 38.7 36.2  PLT 345 316    COAGS: Recent Labs    07/09/18 1037  INR 1.0    BMP: Recent Labs    07/08/18 1610 07/09/18 1037  NA 137 139  K 2.7* 2.9*  CL 101 110  CO2 20* 18*  GLUCOSE 233* 197*  BUN 16 13  CALCIUM 8.4* 7.5*  CREATININE 0.95 0.81  GFRNONAA >60 >60  GFRAA >60 >60    LIVER FUNCTION TESTS: Recent Labs    07/08/18 1610 07/09/18 1037  BILITOT 0.5 0.3  AST 46* 31  ALT 59* 43  ALKPHOS 78 63  PROT 8.1 6.8  ALBUMIN 3.4* 2.8*    TUMOR MARKERS: No results for input(s): AFPTM, CEA, CA199, CHROMGRNA in the last 8760 hours.  Assessment and Plan: 53 y.o. female with history of diabetes, hypertension, fatty liver disease who was recently transferred to [facility] from [facility] secondary to bilateral flank pain, mild dysuria, nausea, vomiting, dry cough, mild dyspnea and imaging findings of numerous stacked calculi of the distal left ureter with associated mild to moderate left hydronephrosis and hydroureter.  There was also an additional nonobstructing calculus in the inferior pole left kidney. She is COVD- 19 positive.  Current labs include normal WBC, hemoglobin 11.4, platelets 316k, potassium 2.9, creatinine 0.81, PT 13.3, INR 1.0, urine culture pending.  Temp 99.1, BP 136/70, heart rate 89.  Request now received from urology for left percutaneous nephrostomy.  Imaging studies have been reviewed by Dr. Kathlene Cote.Risks and benefits of PCN placement was discussed with  the patient including, but not limited to, infection, bleeding, significant bleeding causing loss or decrease in renal function or damage to adjacent structures.   All of the patient's questions were answered, patient is agreeable to proceed.  Consent signed and in chart.   Procedure planned for 6/9    Thank you for this interesting consult.  I greatly enjoyed meeting Leah Farmer and look forward to participating in their care.  A copy of this report was sent to the requesting provider on this date.  Electronically Signed: D. Rowe Robert, PA-C 07/09/2018, 2:23 PM   I spent a total of  20 minutes via phone in clinical consultation, greater than 50% of which was counseling/coordinating care for left percutaneous nephrostomy

## 2018-07-09 NOTE — Progress Notes (Signed)
MEDICATION RELATED CONSULT NOTE - INITIAL   Pharmacy Consult for Remdesivir Indication: Pneumonia, COVID-19 +  Allergies  Allergen Reactions  . Ciprofloxacin Hives    Patient Measurements: Height: 5\' 4"  (162.6 cm) Weight: 250 lb (113.4 kg) IBW/kg (Calculated) : 54.7   Vital Signs: Temp: 99.3 F (37.4 C) (06/08 0455) Temp Source: Oral (06/08 0455) BP: 143/90 (06/08 0455) Pulse Rate: 115 (06/08 0455)  RR: 24 (06/08 0510) with SpO2 97 on O2 [place] at 2 L/min  Intake/Output from previous day: 06/07 0701 - 06/08 0700 In: 1678.1 [I.V.:578.1; IV Piggyback:1100] Out: -  Intake/Output from this shift: No intake/output data recorded.  Labs: Recent Labs    07/08/18 1610  WBC 9.4  HGB 12.5  HCT 38.7  PLT 345  CREATININE 0.95  ALBUMIN 3.4*  PROT 8.1  AST 46*  ALT 59*  ALKPHOS 78  BILITOT 0.5   Estimated Creatinine Clearance: 86.5 mL/min (by C-G formula based on SCr of 0.95 mg/dL).   Microbiology: Recent Results (from the past 720 hour(s))  SARS Coronavirus 2 (CEPHEID- Performed in [place] hospital lab), Hosp Order     Status: Abnormal   Collection Time: 07/08/18  6:21 PM  Result Value Ref Range Status   SARS Coronavirus 2 POSITIVE (A) NEGATIVE Final    Comment: RESULT CALLED TO, READ BACK BY AND VERIFIED WITH: DOSS,M @ 2116 ON 07/08/18 BY JUW (NOTE) If result is NEGATIVE SARS-CoV-2 target nucleic acids are NOT DETECTED. The SARS-CoV-2 RNA is generally detectable in upper and lower  respiratory specimens during the acute phase of infection. The lowest  concentration of SARS-CoV-2 viral copies this assay can detect is 250  copies / mL. A negative result does not preclude SARS-CoV-2 infection  and should not be used as the sole basis for treatment or other  patient management decisions.  A negative result may occur with  improper specimen collection / handling, submission of specimen other  than nasopharyngeal swab, presence of viral mutation(s) within the  areas  targeted by this assay, and inadequate number of viral copies  (<250 copies / mL). A negative result must be combined with clinical  observations, patient history, and epidemiological information. If result is POSITIVE SARS-CoV-2 target nucleic acids are DETECTED. The SA RS-CoV-2 RNA is generally detectable in upper and lower  respiratory specimens during the acute phase of infection.  Positive  results are indicative of active infection with SARS-CoV-2.  Clinical  correlation with patient history and other diagnostic information is  necessary to determine patient infection status.  Positive results do  not rule out bacterial infection or co-infection with other viruses. If result is PRESUMPTIVE POSTIVE SARS-CoV-2 nucleic acids MAY BE PRESENT.   A presumptive positive result was obtained on the submitted specimen  and confirmed on repeat testing.  While 2019 novel coronavirus  (SARS-CoV-2) nucleic acids may be present in the submitted sample  additional confirmatory testing may be necessary for epidemiological  and / or clinical management purposes  to differentiate between  SARS-CoV-2 and other Sarbecovirus currently known to infect humans.  If clinically indicated additional testing with an alternate test  methodology [phone]) is advi sed. The SARS-CoV-2 RNA is generally  detectable in upper and lower respiratory specimens during the acute  phase of infection. The expected result is Negative. Fact Sheet for Patients:  StrictlyIdeas.no Fact Sheet for Healthcare Providers: BankingDealers.co.za This test is not yet approved or cleared by the [place] FDA and has been authorized for detection and/or diagnosis of SARS-CoV-2  by FDA under an Emergency Use Authorization (EUA).  This EUA will remain in effect (meaning this test can be used) for the duration of the COVID-19 declaration under Section 564(b)(1) of the Act, 21 U.S.C. section  360bbb-3(b)(1), unless the authorization is terminated or revoked sooner. Performed at [facility], [address], [place], [place] [address]     Medical History: Past Medical History:  Diagnosis Date  . Diabetes mellitus without complication ([place])   . Hypertension   . NAFL (nonalcoholic fatty liver) 95/10/3265   fatty liver seen on CT scan abd/ pelvis  . Nephrolithiasis   . Tachycardia     Assessment: 53 y/o F admitted 07/08/18 with acute UTI and L hydronephrosis secondary to nephrolithiasis and with lower respiratory tract infection; CXR read as widespread cryptogenic organizing pneumonia, likely sequela of recent atypical infection (including viral pneumonia such as COVID-19).  Nasopharyngeal swab in ED evening of 07/08/18 (+) for SARS-CoV-2.  Orders received from [facility] attending MD this AM to begin remdesivir with pharmacy assistance.  Concomitant ceftriaxone and methylprednisolone noted.  Review of [place] Guidance Criteria for consideration of remdesivir use:  COVID (+) inpatient  Severe LRTI requiring supplemental O2  CXR positive for findings c/w LRTI  ALT < 220  (59 on 07/08/18; slightly elevated but well below 5x ULN)  Floor status but admitted < 5 days  Patient meets above criteria for consideration of remdesivir use.  Urology was consulted by attending MD for management of L hydronephrosis secondary to nephrolithiasis.  Orders noted for placement of L nephrostomy tube today by IR.   Given the need for patient to remain at [facility] for this urgent procedure, have contacted Pharmacy at [facility] and they have agreed to send today's remdesivir dose to [facility].  Plan:  1. Remdesivir 200 mg IV x 1 today, then 100 mg IV daily x 4 doses (5 day total regimen). 2. CMet and CBC daily x 5. 3. Follow clinical status and plans for ? continued treatment at [facility] vs potential transfer to [place].  Clayburn Pert, PharmD, BCPS [phone] 07/09/2018  9:01 AM

## 2018-07-09 NOTE — Plan of Care (Signed)

## 2018-07-10 ENCOUNTER — Inpatient Hospital Stay (HOSPITAL_COMMUNITY): Payer: BC Managed Care – PPO

## 2018-07-10 LAB — CBC WITH DIFFERENTIAL/PLATELET
Abs Immature Granulocytes: 0.12 10*3/uL — ABNORMAL HIGH (ref 0.00–0.07)
Basophils Absolute: 0 10*3/uL (ref 0.0–0.1)
Basophils Relative: 0 %
Eosinophils Absolute: 0 10*3/uL (ref 0.0–0.5)
Eosinophils Relative: 0 %
HCT: 37.4 % (ref 36.0–46.0)
Hemoglobin: 11.6 g/dL — ABNORMAL LOW (ref 12.0–15.0)
Immature Granulocytes: 2 %
Lymphocytes Relative: 16 %
Lymphs Abs: 0.9 10*3/uL (ref 0.7–4.0)
MCH: 28.4 pg (ref 26.0–34.0)
MCHC: 31 g/dL (ref 30.0–36.0)
MCV: 91.7 fL (ref 80.0–100.0)
Monocytes Absolute: 0.3 10*3/uL (ref 0.1–1.0)
Monocytes Relative: 5 %
Neutro Abs: 4.4 10*3/uL (ref 1.7–7.7)
Neutrophils Relative %: 77 %
Platelets: 346 10*3/uL (ref 150–400)
RBC: 4.08 MIL/uL (ref 3.87–5.11)
RDW: 14 % (ref 11.5–15.5)
WBC: 5.7 10*3/uL (ref 4.0–10.5)
nRBC: 0 % (ref 0.0–0.2)

## 2018-07-10 LAB — COMPREHENSIVE METABOLIC PANEL
ALT: 39 U/L (ref 0–44)
AST: 27 U/L (ref 15–41)
Albumin: 2.8 g/dL — ABNORMAL LOW (ref 3.5–5.0)
Alkaline Phosphatase: 64 U/L (ref 38–126)
Anion gap: 10 (ref 5–15)
BUN: 24 mg/dL — ABNORMAL HIGH (ref 6–20)
CO2: 16 mmol/L — ABNORMAL LOW (ref 22–32)
Calcium: 7.7 mg/dL — ABNORMAL LOW (ref 8.9–10.3)
Chloride: 113 mmol/L — ABNORMAL HIGH (ref 98–111)
Creatinine, Ser: 0.99 mg/dL (ref 0.44–1.00)
GFR calc Af Amer: >60 mL/min (ref >60)
GFR calc non Af Amer: >60 mL/min (ref >60)
Glucose, Bld: 334 mg/dL — ABNORMAL HIGH (ref 70–99)
Potassium: 3.3 mmol/L — ABNORMAL LOW (ref 3.5–5.1)
Sodium: 139 mmol/L (ref 135–145)
Total Bilirubin: 0.3 mg/dL (ref 0.3–1.2)
Total Protein: 7.1 g/dL (ref 6.5–8.1)

## 2018-07-10 LAB — GLUCOSE, CAPILLARY
Glucose-Capillary: 299 mg/dL — ABNORMAL HIGH (ref 70–99)
Glucose-Capillary: 245 mg/dL — ABNORMAL HIGH (ref 70–99)
Glucose-Capillary: 261 mg/dL — ABNORMAL HIGH (ref 70–99)
Glucose-Capillary: 267 mg/dL — ABNORMAL HIGH (ref 70–99)

## 2018-07-10 LAB — D-DIMER, QUANTITATIVE: D-Dimer, Quant: 1.47 ug/mL-FEU — ABNORMAL HIGH (ref 0.00–0.50)

## 2018-07-10 LAB — SURGICAL PCR SCREEN
MRSA, PCR: NEGATIVE
Staphylococcus aureus: POSITIVE — AB

## 2018-07-10 LAB — C-REACTIVE PROTEIN: CRP: 15 mg/dL — ABNORMAL HIGH (ref <1.0)

## 2018-07-10 LAB — MAGNESIUM: Magnesium: 1.7 mg/dL (ref 1.7–2.4)

## 2018-07-10 MED ORDER — INSULIN ASPART 100 UNIT/ML ~~LOC~~ SOLN
0.0000 [IU] | Freq: Every day | SUBCUTANEOUS | Status: DC
  Administered 2018-07-10: 23:00:00 3 [IU] via SUBCUTANEOUS
  Administered 2018-07-11: 2 [IU] via SUBCUTANEOUS
  Administered 2018-07-12: 4 [IU] via SUBCUTANEOUS
  Administered 2018-07-13: 3 [IU] via SUBCUTANEOUS

## 2018-07-10 MED ORDER — LIDOCAINE HCL 1 % IJ SOLN
INTRAMUSCULAR | Status: AC
  Filled 2018-07-10: qty 20

## 2018-07-10 MED ORDER — MIDAZOLAM HCL 2 MG/2ML IJ SOLN
INTRAMUSCULAR | Status: AC | PRN
  Administered 2018-07-10 (×2): 1 mg via INTRAVENOUS

## 2018-07-10 MED ORDER — INSULIN ASPART 100 UNIT/ML ~~LOC~~ SOLN: 0.0000 [IU] | Freq: Three times a day (TID) | SUBCUTANEOUS | Status: DC

## 2018-07-10 MED ORDER — FENTANYL CITRATE (PF) 100 MCG/2ML IJ SOLN
INTRAMUSCULAR | Status: AC
  Filled 2018-07-10: qty 4

## 2018-07-10 MED ORDER — FENTANYL CITRATE (PF) 100 MCG/2ML IJ SOLN
INTRAMUSCULAR | Status: AC | PRN
  Administered 2018-07-10: 50 ug via INTRAVENOUS

## 2018-07-10 MED ORDER — MIDAZOLAM HCL 2 MG/2ML IJ SOLN
INTRAMUSCULAR | Status: AC
  Filled 2018-07-10: qty 4

## 2018-07-10 MED ORDER — INSULIN ASPART 100 UNIT/ML ~~LOC~~ SOLN
0.0000 [IU] | Freq: Three times a day (TID) | SUBCUTANEOUS | Status: DC
  Administered 2018-07-10: 8 [IU] via SUBCUTANEOUS
  Administered 2018-07-10: 5 [IU] via SUBCUTANEOUS
  Administered 2018-07-11 (×2): 8 [IU] via SUBCUTANEOUS
  Administered 2018-07-11: 5 [IU] via SUBCUTANEOUS
  Administered 2018-07-12: 11 [IU] via SUBCUTANEOUS
  Administered 2018-07-12: 09:00:00 8 [IU] via SUBCUTANEOUS
  Administered 2018-07-12: 15 [IU] via SUBCUTANEOUS
  Administered 2018-07-13: 3 [IU] via SUBCUTANEOUS
  Administered 2018-07-13 (×2): 5 [IU] via SUBCUTANEOUS
  Administered 2018-07-14: 3 [IU] via SUBCUTANEOUS

## 2018-07-10 MED ORDER — INSULIN GLARGINE 100 UNIT/ML ~~LOC~~ SOLN
15.0000 [IU] | Freq: Every day | SUBCUTANEOUS | Status: DC
  Administered 2018-07-10 – 2018-07-11 (×2): 15 [IU] via SUBCUTANEOUS
  Filled 2018-07-10 (×2): qty 0.15

## 2018-07-10 MED ORDER — POTASSIUM CHLORIDE CRYS ER 20 MEQ PO TBCR
40.0000 meq | EXTENDED_RELEASE_TABLET | Freq: Once | ORAL | Status: AC
  Administered 2018-07-10: 40 meq via ORAL
  Filled 2018-07-10: qty 2

## 2018-07-10 NOTE — Plan of Care (Signed)

## 2018-07-10 NOTE — Progress Notes (Signed)
Interventional Radiology Progress Note  Patient brought to IR for left PCN placement. States that she has passed at least 3 calculi today in urine. She has saved some of them.  [place] now shows absolutely normal left kidney with no hydronephrosis. There is no need currently for percutaneous nephrostomy and it would be technically difficult to even get any access to the decompressed collecting system percutaneously.  Venetia Night. Kathlene Cote, M.D Pager:  [phone]

## 2018-07-10 NOTE — Progress Notes (Addendum)
Inpatient Diabetes Program Recommendations  AACE/ADA: New Consensus Statement on Inpatient Glycemic Control (2015)  Target Ranges:  Prepandial:   less than 140 mg/dL      Peak postprandial:   less than 180 mg/dL (1-2 hours)      Critically ill patients:  140 - 180 mg/dL   Results for SALSABEEL, GORELICK (MRN [id number]) as of 07/10/2018 08:57  Ref. Range 07/09/2018 09:11 07/09/2018 11:55 07/09/2018 17:24 07/09/2018 22:12  Glucose-Capillary Latest Ref Range: 70 - 99 mg/dL 170 (H)    Solumedrol started at 9am 183 (H)  2 units NOVOLOG  301 (H)  7 units NOVOLOG  274 (H)   Results for LYNNDA, WIERSMA (MRN [id number]) as of 07/10/2018 08:57  Ref. Range 07/10/2018 07:48  Glucose-Capillary Latest Ref Range: 70 - 99 mg/dL 299 (H)    Admit with: Nephrolithiasis with L hydronephrosis Acute UTI/ Dyspnea/ Cough secondary to CAP/ Covid -19 infection  History: DM  Home DM Meds: Janumet XR 100/1000 mg Daily PM  Current Orders: Novolog Sensitive Correction Scale/ SSI (0-9 units) TID AC      NPO today for percutaneous nephrostomy tube placement.  Patient getting Solumedrol 60 mg BID.  CBG 299 mg/dl this AM.     MD- Please consider the following in-hospital insulin adjustments while patient getting IV Steroids:   1. Start Lantus 15 units Daily (please start today)--(0.15 units/kg dosing based on weight of 113 kg)   2. Increase Novolog SSI to Moderate scale (0-15 units) TID AC + HS      --Will follow patient during hospitalization--  Wyn Quaker RN, MSN, CDE Diabetes Coordinator Inpatient Glycemic Control Team Team Pager: [phone] (8a-5p)

## 2018-07-10 NOTE — Progress Notes (Signed)
PROGRESS NOTE    Leah Farmer  QMV:[id number] DOB: 1965-02-12 DOA: 07/08/2018 PCP: Margy Clarks, NP     Brief Narrative:  Leah Farmer is a 53 year old female with past medical history significant for type 2 diabetes, history of nephrolithiasis who presented to the hospital with chief complaint of bilateral flank pain radiating to her groin since this past Monday.  She also admitted to some nausea, vomiting without any fevers or chills.  She has had a dry cough and some shortness of breath.  In the emergency department, CT renal stone revealed left nephrolithiasis with moderate left hydronephrosis and hydroureter.  COVID testing was positive.  Patient transferred to [facility] for urological evaluation.  New events last 24 hours / Subjective: Her breathing has improved, remains on nasal cannula O2.  Pain controlled with IV medications.  No further nausea.  Awaiting nephrostomy tube placement today.  Assessment & Plan:   Principal Problem:   COVID-19 virus infection Active Problems:   Nausea & vomiting   Acute flank pain   Flank pain   Nephrolithiasis   Hydronephrosis, left   Acute lower UTI   Tachycardia   CAP (community acquired pneumonia)   Hypokalemia   Abnormal liver function   Left nephrolithiasis with left hydronephrosis -Urology consulted -Plan for left nephrostomy tube placement 6/9 -Urine culture showed multiple species present -Continue Rocephin  COVID-19 positive -Currently requiring 2L [place] O2  -Chest x-ray consistent with COVID infection -Ferritin elevated 461, CRP elevated 16.6, sed rate elevated 65, d-dimer elevated 0.95 procalcitonin negative, -Started on IV Solu-Medrol 60 mg every 12h, Remdesivir -Daily CRP and d-dimer -Prone daily  Acute hypoxemic respiratory failure -Due to COVID-19  Hypokalemia -Replace, trend  DM type 2 -With hyperglycemia while on steroids -Add Lantus, increase sliding scale insulin dose   DVT prophylaxis:  SCD Code Status: Full code Family Communication: None Disposition Plan: Pending nephrostomy tube placement today.  Once stable from urological standpoint and no further procedures planned, transfer to [place] for treatment of COVID-19 and respiratory failure   Consultants:   Urology  IR  Procedures:   None  Antimicrobials:  Anti-infectives (From admission, onward)   Start     Dose/Rate Route Frequency Ordered Stop   07/10/18 1000  remdesivir 100 mg in sodium chloride 0.9 % 230 mL IVPB     100 mg 500 mL/hr over 30 Minutes Intravenous Every 24 hours 07/09/18 0823 07/14/18 0959   07/09/18 1000  remdesivir 200 mg in sodium chloride 0.9 % 210 mL IVPB     200 mg 500 mL/hr over 30 Minutes Intravenous Once 07/09/18 0823 07/09/18 1258   07/09/18 0900  cefTRIAXone (ROCEPHIN) 1 g in sodium chloride 0.9 % 100 mL IVPB     1 g 200 mL/hr over 30 Minutes Intravenous Every 24 hours 07/09/18 0748     07/08/18 2100  azithromycin (ZITHROMAX) 500 mg in sodium chloride 0.9 % 250 mL IVPB     500 mg 250 mL/hr over 60 Minutes Intravenous  Once 07/08/18 2058 07/08/18 2304   07/08/18 1730  cefTRIAXone (ROCEPHIN) 1 g in sodium chloride 0.9 % 100 mL IVPB     1 g 200 mL/hr over 30 Minutes Intravenous  Once 07/08/18 1719 07/08/18 1909       Objective: Vitals:   07/09/18 0906 07/09/18 1158 07/09/18 2032 07/10/18 0628  BP: 137/85 136/70 131/84 134/85  Pulse: (!) 103 89 80 85  Resp:  (!) 25 (!) 22 18  Temp: 99.3 F (37.4  C) 99.1 F (37.3 C) 98.6 F (37 C) 97.7 F (36.5 C)  TempSrc: Oral Oral Oral Oral  SpO2: 96% 96% 93% 93%  Weight:      Height:        Intake/Output Summary (Last 24 hours) at 07/10/2018 1117 Last data filed at 07/10/2018 0600 Gross per 24 hour  Intake 2160.34 ml  Output -  Net 2160.34 ml   Filed Weights   07/08/18 1546 07/09/18 0510  Weight: 113.4 kg 113.4 kg    Examination: General exam: Appears calm and comfortable  Respiratory system: Clear to  auscultation. Respiratory effort normal.  On 2 L nasal cannula O2 Cardiovascular system: S1 & S2 heard, RRR. No JVD, murmurs, rubs, gallops or clicks. No pedal edema. Gastrointestinal system: Abdomen is nondistended, soft and nontender. No organomegaly or masses felt. Normal bowel sounds heard. Central nervous system: Alert and oriented. No focal neurological deficits. Extremities: Symmetric 5 x 5 power. Skin: No rashes, lesions or ulcers Psychiatry: Judgement and insight appear normal. Mood & affect appropriate.    Data Reviewed: I have personally reviewed following labs and imaging studies  CBC: Recent Labs  Lab 07/08/18 1610 07/09/18 1037 07/10/18 0734  WBC 9.4 9.0 5.7  NEUTROABS 7.5 7.7 4.4  HGB 12.5 11.4* 11.6*  HCT 38.7 36.2 37.4  MCV 87.0 91.0 91.7  PLT 345 316 702   Basic Metabolic Panel: Recent Labs  Lab 07/08/18 1610 07/09/18 1037 07/10/18 0734  NA 137 139 139  K 2.7* 2.9* 3.3*  CL 101 110 113*  CO2 20* 18* 16*  GLUCOSE 233* 197* 334*  BUN 16 13 24*  CREATININE 0.95 0.81 0.99  CALCIUM 8.4* 7.5* 7.7*  MG  --  1.7 1.7  PHOS  --  1.6*  --    GFR: Estimated Creatinine Clearance: 83 mL/min (by C-G formula based on SCr of 0.99 mg/dL). Liver Function Tests: Recent Labs  Lab 07/08/18 1610 07/09/18 1037 07/10/18 0734  AST 46* 31 27  ALT 59* 43 39  ALKPHOS 78 63 64  BILITOT 0.5 0.3 0.3  PROT 8.1 6.8 7.1  ALBUMIN 3.4* 2.8* 2.8*   No results for input(s): LIPASE, AMYLASE in the last 168 hours. No results for input(s): AMMONIA in the last 168 hours. Coagulation Profile: Recent Labs  Lab 07/09/18 1037  INR 1.0   Cardiac Enzymes: Recent Labs  Lab 07/08/18 1616  CKTOTAL 32*  CKMB 3.5   BNP (last 3 results) No results for input(s): PROBNP in the last 8760 hours. HbA1C: No results for input(s): HGBA1C in the last 72 hours. CBG: Recent Labs  Lab 07/09/18 0911 07/09/18 1155 07/09/18 1724 07/09/18 2212 07/10/18 0748  GLUCAP 170* 183* 301* 274*  299*   Lipid Profile: No results for input(s): CHOL, HDL, LDLCALC, TRIG, CHOLHDL, LDLDIRECT in the last 72 hours. Thyroid Function Tests: No results for input(s): TSH, T4TOTAL, FREET4, T3FREE, THYROIDAB in the last 72 hours. Anemia Panel: Recent Labs    07/08/18 1616  FERRITIN 461*   Sepsis Labs: Recent Labs  Lab 07/08/18 1616  PROCALCITON <0.10    Recent Results (from the past 240 hour(s))  Urine Culture     Status: Abnormal   Collection Time: 07/08/18  5:19 PM  Result Value Ref Range Status   Specimen Description   Final    URINE, CLEAN CATCH Performed at [facility], [address] [place], [place] [address]    Special Requests   Final    NONE Performed at [facility],  [address] [place], [place] [address]    Culture (A)  Final    30,000 COLONIES/mL MULTIPLE SPECIES PRESENT, SUGGEST RECOLLECTION   Report Status 07/09/2018 FINAL  Final  SARS Coronavirus 2 (CEPHEID- Performed in [place] hospital lab), Hosp Order     Status: Abnormal   Collection Time: 07/08/18  6:21 PM  Result Value Ref Range Status   SARS Coronavirus 2 POSITIVE (A) NEGATIVE Final    Comment: RESULT CALLED TO, READ BACK BY AND VERIFIED WITH: DOSS,M @ 2116 ON 07/08/18 BY JUW (NOTE) If result is NEGATIVE SARS-CoV-2 target nucleic acids are NOT DETECTED. The SARS-CoV-2 RNA is generally detectable in upper and lower  respiratory specimens during the acute phase of infection. The lowest  concentration of SARS-CoV-2 viral copies this assay can detect is 250  copies / mL. A negative result does not preclude SARS-CoV-2 infection  and should not be used as the sole basis for treatment or other  patient management decisions.  A negative result may occur with  improper specimen collection / handling, submission of specimen other  than nasopharyngeal swab, presence of viral mutation(s) within the  areas targeted by this assay, and inadequate number of viral copies  (<250 copies / mL). A negative  result must be combined with clinical  observations, patient history, and epidemiological information. If result is POSITIVE SARS-CoV-2 target nucleic acids are DETECTED. The SA RS-CoV-2 RNA is generally detectable in upper and lower  respiratory specimens during the acute phase of infection.  Positive  results are indicative of active infection with SARS-CoV-2.  Clinical  correlation with patient history and other diagnostic information is  necessary to determine patient infection status.  Positive results do  not rule out bacterial infection or co-infection with other viruses. If result is PRESUMPTIVE POSTIVE SARS-CoV-2 nucleic acids MAY BE PRESENT.   A presumptive positive result was obtained on the submitted specimen  and confirmed on repeat testing.  While 2019 novel coronavirus  (SARS-CoV-2) nucleic acids may be present in the submitted sample  additional confirmatory testing may be necessary for epidemiological  and / or clinical management purposes  to differentiate between  SARS-CoV-2 and other Sarbecovirus currently known to infect humans.  If clinically indicated additional testing with an alternate test  methodology [phone]) is advi sed. The SARS-CoV-2 RNA is generally  detectable in upper and lower respiratory specimens during the acute  phase of infection. The expected result is Negative. Fact Sheet for Patients:  StrictlyIdeas.no Fact Sheet for Healthcare Providers: BankingDealers.co.za This test is not yet approved or cleared by the [place] FDA and has been authorized for detection and/or diagnosis of SARS-CoV-2 by FDA under an Emergency Use Authorization (EUA).  This EUA will remain in effect (meaning this test can be used) for the duration of the COVID-19 declaration under Section 564(b)(1) of the Act, 21 U.S.C. section 360bbb-3(b)(1), unless the authorization is terminated or revoked sooner. Performed at [facility], [address] [place],  [address]   Surgical PCR screen     Status: Abnormal   Collection Time: 07/09/18 11:33 PM  Result Value Ref Range Status   MRSA, PCR NEGATIVE NEGATIVE Final   Staphylococcus aureus POSITIVE (A) NEGATIVE Final    Comment: (NOTE) The Xpert SA Assay (FDA approved for NASAL specimens in patients 70 years of age and older), is one component of a comprehensive surveillance program. It is not intended to diagnose infection nor to guide or monitor treatment. Performed at [facility],  [place] [address], [place], [place] [address]        Radiology Studies: Dg Chest Portable 1 View  Result Date: 07/08/2018 CLINICAL DATA:  53 year old female with history of shortness of breath. EXAM: PORTABLE CHEST 1 VIEW COMPARISON:  Chest x-ray 07/08/2018. FINDINGS: Widespread interstitial prominence and patchy ill-defined airspace disease scattered throughout the lungs bilaterally. No definite pleural effusions. Pulmonary vasculature is obscured. Heart size is borderline enlarged. Upper mediastinal contours are within normal limits. IMPRESSION: 1. Unusual appearance in the lungs, which based on comparison with recent CT the abdomen and pelvis is favored to reflect widespread cryptogenic organizing pneumonia, likely sequela of recent atypical infection (including viral pneumonia such as COVID-19). Electronically Signed   By: Vinnie Langton M.D.   On: 07/08/2018 20:49   Ct Renal Stone Study  Result Date: 07/08/2018 CLINICAL DATA:  Urinary frequency, dysuria, lower back pain EXAM: CT ABDOMEN AND PELVIS WITHOUT CONTRAST TECHNIQUE: Multidetector CT imaging of the abdomen and pelvis was performed following the standard protocol without IV contrast. COMPARISON:  03/06/2015 FINDINGS: Lower chest: There is extensive, irregular bibasilar opacity and consolidation in a peribronchovascular pattern. Hepatobiliary: No solid liver abnormality is seen. Hepatic steatosis.  Status post cholecystectomy. Pancreas: Unremarkable. No pancreatic ductal dilatation or surrounding inflammatory changes. Spleen: Normal in size without significant abnormality. Adrenals/Urinary Tract: Adrenal glands are unremarkable. There are numerous stacked calculi in the distal left ureter, measuring approximately 4.7 cm in total length, the largest discrete calculus measuring 1.4 cm (series 5, image 65). There is moderate left hydronephrosis and hydroureter. There is an additional nonobstructive calculus in the inferior pole of the left kidney. Bladder is unremarkable. Stomach/Bowel: Stomach is within normal limits. No evidence of bowel wall thickening, distention, or inflammatory changes. Vascular/Lymphatic: No significant vascular findings are present. No enlarged abdominal or pelvic lymph nodes. Reproductive: No mass or other significant abnormality. Other: No abdominal wall hernia or abnormality. No abdominopelvic ascites. Musculoskeletal: No acute or significant osseous findings. IMPRESSION: 1. There are numerous stacked calculi in the distal left ureter, measuring approximately 4.7 cm in total length, the largest discrete calculus measuring 1.4 cm (series 5, image 65). There is moderate left hydronephrosis and hydroureter. There is an additional nonobstructive calculus in the inferior pole of the left kidney. 2. There is extensive, irregular bibasilar opacity and consolidation in a peribronchovascular pattern, of uncertain etiology, possibly acutely infectious or inflammatory although chronic interstitial lung disease is a differential consideration. Recommend nonemergent ILD protocol CT examination of the chest to further evaluate. 3.  Hepatic steatosis. Electronically Signed   By: Eddie Candle M.D.   On: 07/08/2018 18:00      Scheduled Meds: .  HYDROmorphone (DILAUDID) injection  0.5 mg Intravenous Once  . insulin aspart  0-15 Units Subcutaneous TID WC  . insulin aspart  0-5 Units Subcutaneous  QHS  . insulin glargine  15 Units Subcutaneous Daily  . methylPREDNISolone (SOLU-MEDROL) injection  60 mg Intravenous Q12H  . metoprolol succinate  50 mg Oral Daily  . mupirocin ointment  1 application Nasal BID  . norgestrel-ethinyl estradiol  1 tablet Oral Daily  . pantoprazole  40 mg Oral Daily  . potassium chloride  40 mEq Oral Once   Continuous Infusions: . sodium chloride 100 mL/hr at 07/10/18 0945  . cefTRIAXone (ROCEPHIN)  IV 1 g (07/10/18 0954)  . remdesivir 100 mg in NS 250 mL       LOS: 2 days    Time spent: 25 minutes   Dessa Phi, DO [facility] [email or web address] 07/10/2018, 11:17 AM

## 2018-07-10 NOTE — Discharge Instructions (Addendum)
People who have recovered from the coronavirus, can help a person still fighting the virus and potentially help them recover by giving them convalescent plasma.  What is COVID-19 convalescent plasma? Convalescent plasma (CPP) is plasma collected from people who have recovered from the coronavirus. People who recover from coronavirus infection have developed antibodies to the virus that remain in the plasma portion of their blood. Transfusing the plasma that contains the antibodies into a person still fighting the virus can provide a boost to the patients immune system and potentially help them recover. The experimental treatment is approved by the FDA to be used on an emergency basis and is called COVID-19 convalescent plasma." Critically ill patients who meet the FDA criteria to receive this therapy can be treated for life-threatening COVID-19.  Can I donate convalescent plasma or blood after being diagnosed with COVID-19? Donors must meet all the required screening criteria for blood donation and the additional FDA criteria, as follows:   Prior diagnosis of COVID-19 documented by an FDA approved laboratory test   A positive diagnostic test at the time of illness OR   A positive serological test for SARS-CoV-2 antibodies after recovery  Complete resolution of symptoms at least 14 days prior to donation and a documented negative COVID-19 FDA approved test OR   Complete resolution of symptoms at least 28 days prior to donation As a part of your pre-donation process you will be required to provide your COVID-19 test result(s).  Where can I donate COVID-19 convalescent plasma? You can complete the pre-donation form at [email or web address] and a donor specialist will be in contact within 24-48 hours.  What is the process for donating COVID-19 Convalescent Plasma?  1. The first step in the donation process is to obtain a copy of your test results or a letter from the testing facility  notifying you of your positive result and the date it was taken 2. Complete the pre-donation form at [email or web address] 3. A representative will be in contact within 24-48 hours to provide additional insight into the process 4. Once the process begins, [facility] will ensure you   Meet the required screening criteria for blood donation   Have the required test results   Meet the criteria for resolution of your symptoms   Complete resolution of symptoms at least 14 days prior to donation and a documented negative COVID-19 FDA approved test OR   Complete resolution of symptoms at least 28 days prior to donation 5. [facility] will then schedule a collection date and time to collect your COVID-19 convalescent plasma  Is the COVID-19 convalescent plasma donation safe? It is safe to donate COVID-19 convalescent plasma and done in the same way that thousands of blood donors donate blood products every day.  My family member or friend is being treated for COVID-19. Can I donate to help them if I am eligible? A directed donation will need to be arranged in conjunction with your family members care provider.  The care provider will determine whether COVID-19 convalescent plasma is the best course of treatment   A directed donation must be arranged through the Clinician Referral page that can be found at [email or web address]   The clinician will need to provide [facility] with information about both you and the patient for which the donation is directed   Please note there are additional qualifications that are required such as your blood type and the patients blood type We encourage you to consider beginning the pre-registration process even if your loved one will  not be receiving COVID-19 convalescent plasma as a part of their medical treatment. The need for COVID-19 convalescent plasma increases daily and your donated COVID-19 convalescent plasma could be life-saving for another  COVID-19  patient.  Please go to the website [email or web address] if you would like to consider volunteer for plasma donation.         Person Under Monitoring Name: Leah Farmer  Location: [place] [address]   Infection Prevention Recommendations for Individuals Confirmed to have, or Being Evaluated for, 2019 Novel Coronavirus (COVID-19) Infection Who Receive Care at Home  Individuals who are confirmed to have, or are being evaluated for, COVID-19 should follow the prevention steps below until a healthcare provider or local or state health department says they can return to normal activities.  Stay home except to get medical care You should restrict activities outside your home, except for getting medical care. Do not go to work, school, or public areas, and do not use public transportation or taxis.  Call ahead before visiting your doctor Before your medical appointment, call the healthcare provider and tell them that you have, or are being evaluated for, COVID-19 infection. This will help the healthcare providers office take steps to keep other people from getting infected. Ask your healthcare provider to call the local or state health department.  Monitor your symptoms Seek prompt medical attention if your illness is worsening (e.g., difficulty breathing). Before going to your medical appointment, call the healthcare provider and tell them that you have, or are being evaluated for, COVID-19 infection. Ask your healthcare provider to call the local or state health department.  Wear a facemask You should wear a facemask that covers your nose and mouth when you are in the same room with other people and when you visit a healthcare provider. People who live with or visit you should also wear a facemask while they are in the same room with you.  Separate yourself from other people in your home As much as possible, you should stay in a different room from other  people in your home. Also, you should use a separate bathroom, if available.  Avoid sharing household items You should not share dishes, drinking glasses, cups, eating utensils, towels, bedding, or other items with other people in your home. After using these items, you should wash them thoroughly with soap and water.  Cover your coughs and sneezes Cover your mouth and nose with a tissue when you cough or sneeze, or you can cough or sneeze into your sleeve. Throw used tissues in a lined trash can, and immediately wash your hands with soap and water for at least 20 seconds or use an alcohol-based hand rub.  Wash your [facility] your hands often and thoroughly with soap and water for at least 20 seconds. You can use an alcohol-based hand sanitizer if soap and water are not available and if your hands are not visibly dirty. Avoid touching your eyes, nose, and mouth with unwashed hands.   Prevention Steps for Caregivers and Household Members of Individuals Confirmed to have, or Being Evaluated for, COVID-19 Infection Being Cared for in the Home  If you live with, or provide care at home for, a person confirmed to have, or being evaluated for, COVID-19 infection please follow these guidelines to prevent infection:  Follow healthcare providers instructions Make sure that you understand and can help the patient follow any healthcare provider instructions for all care.  Provide for the patients basic needs  You should help the patient with basic needs in the home and provide support for getting groceries, prescriptions, and other personal needs.  Monitor the patients symptoms If they are getting sicker, call his or her medical provider and tell them that the patient has, or is being evaluated for, COVID-19 infection. This will help the healthcare providers office take steps to keep other people from getting infected. Ask the healthcare provider to call the local or state health  department.  Limit the number of people who have contact with the patient  If possible, have only one caregiver for the patient.  Other household members should stay in another home or place of residence. If this is not possible, they should stay  in another room, or be separated from the patient as much as possible. Use a separate bathroom, if available.  Restrict visitors who do not have an essential need to be in the home.  Keep older adults, very young children, and other sick people away from the patient Keep older adults, very young children, and those who have compromised immune systems or chronic health conditions away from the patient. This includes people with chronic heart, lung, or kidney conditions, diabetes, and cancer.  Ensure good ventilation Make sure that shared spaces in the home have good air flow, such as from an air conditioner or an opened window, weather permitting.  Wash your hands often  Wash your hands often and thoroughly with soap and water for at least 20 seconds. You can use an alcohol based hand sanitizer if soap and water are not available and if your hands are not visibly dirty.  Avoid touching your eyes, nose, and mouth with unwashed hands.  Use disposable paper towels to dry your hands. If not available, use dedicated cloth towels and replace them when they become wet.  Wear a facemask and gloves  Wear a disposable facemask at all times in the room and gloves when you touch or have contact with the patients blood, body fluids, and/or secretions or excretions, such as sweat, saliva, sputum, nasal mucus, vomit, urine, or feces.  Ensure the mask fits over your nose and mouth tightly, and do not touch it during use.  Throw out disposable facemasks and gloves after using them. Do not reuse.  Wash your hands immediately after removing your facemask and gloves.  If your personal clothing becomes contaminated, carefully remove clothing and launder. Wash  your hands after handling contaminated clothing.  Place all used disposable facemasks, gloves, and other waste in a lined container before disposing them with other household waste.  Remove gloves and wash your hands immediately after handling these items.  Do not share dishes, glasses, or other household items with the patient  Avoid sharing household items. You should not share dishes, drinking glasses, cups, eating utensils, towels, bedding, or other items with a patient who is confirmed to have, or being evaluated for, COVID-19 infection.  After the person uses these items, you should wash them thoroughly with soap and water.  Wash laundry thoroughly  Immediately remove and wash clothes or bedding that have blood, body fluids, and/or secretions or excretions, such as sweat, saliva, sputum, nasal mucus, vomit, urine, or feces, on them.  Wear gloves when handling laundry from the patient.  Read and follow directions on labels of laundry or clothing items and detergent. In general, wash and dry with the warmest temperatures recommended on the label.  Clean all areas the individual has used often  Clean all  touchable surfaces, such as counters, tabletops, doorknobs, bathroom fixtures, toilets, phones, keyboards, tablets, and bedside tables, every day. Also, clean any surfaces that may have blood, body fluids, and/or secretions or excretions on them.  Wear gloves when cleaning surfaces the patient has come in contact with.  Use a diluted bleach solution (e.g., dilute bleach with 1 part bleach and 10 parts water) or a household disinfectant with a label that says EPA-registered for coronaviruses. To make a bleach solution at home, add 1 tablespoon of bleach to 1 quart (4 cups) of water. For a larger supply, add  cup of bleach to 1 gallon (16 cups) of water.  Read labels of cleaning products and follow recommendations provided on product labels. Labels contain instructions for safe and  effective use of the cleaning product including precautions you should take when applying the product, such as wearing gloves or eye protection and making sure you have good ventilation during use of the product.  Remove gloves and wash hands immediately after cleaning.  Monitor yourself for signs and symptoms of illness Caregivers and household members are considered close contacts, should monitor their health, and will be asked to limit movement outside of the home to the extent possible. Follow the monitoring steps for close contacts listed on the symptom monitoring form.   ? If you have additional questions, contact your local health department or call the epidemiologist on call at [phone] (available 24/7). ? This guidance is subject to change. For the most up-to-date guidance from [facility], please refer to their website: [email or web address]     Follow with Margy Clarks, NP in 5-7 days  Please get a complete blood count and chemistry panel checked by your Primary MD at your next visit, and again as instructed by your Primary MD. Please get your medications reviewed and adjusted by your Primary MD.  Please request your Primary MD to go over all Hospital Tests and Procedure/Radiological results at the follow up, please get all Hospital records sent to your Prim MD by signing hospital release before you go home.  In some cases, there will be blood work, cultures and biopsy results pending at the time of your discharge. Please request that your primary care M.D. goes through all the records of your hospital data and follows up on these results.  If you had Pneumonia of Lung problems at the Hospital: Please get a 2 view Chest X ray done in 6-8 weeks after hospital discharge or sooner if instructed by your Primary MD.  If you have Congestive Heart Failure: Please call your Cardiologist or Primary MD anytime you have any of the  following symptoms:  1) 3 pound weight gain in 24 hours or 5 pounds in 1 week  2) shortness of breath, with or without a dry hacking cough  3) swelling in the hands, feet or stomach  4) if you have to sleep on extra pillows at night in order to breathe  Follow cardiac low salt diet and 1.5 lit/day fluid restriction.  If you have diabetes Accuchecks 4 times/day, Once in AM empty stomach and then before each meal. Log in all results and show them to your primary doctor at your next visit. If any glucose reading is under 80 or above 300 call your primary MD immediately.  If you have Seizure/Convulsions/Epilepsy: Please do not drive, operate heavy machinery, participate in activities at heights or participate in high speed sports until you have seen by Primary MD or a Neurologist and advised to do  so again. Per [facility] statutes, patients with seizures are not allowed to drive until they have been seizure-free for six months.  Use caution when using heavy equipment or power tools. Avoid working on ladders or at heights. Take showers instead of baths. Ensure the water temperature is not too high on the home water heater. Do not go swimming alone. Do not lock yourself in a room alone (i.e. bathroom). When caring for infants or small children, sit down when holding, feeding, or changing them to minimize risk of injury to the child in the event you have a seizure. Maintain good sleep hygiene. Avoid alcohol.   If you had Gastrointestinal Bleeding: Please ask your Primary MD to check a complete blood count within one week of discharge or at your next visit. Your endoscopic/colonoscopic biopsies that are pending at the time of discharge, will also need to followed by your Primary MD.  Get Medicines reviewed and adjusted. Please take all your medications with you for your next visit with your Primary MD  Please request your Primary MD to go over all hospital tests and procedure/radiological  results at the follow up, please ask your Primary MD to get all Hospital records sent to his/her office.  If you experience worsening of your admission symptoms, develop shortness of breath, life threatening emergency, suicidal or homicidal thoughts you must seek medical attention immediately by calling 911 or calling your MD immediately  if symptoms less severe.  You must read complete instructions/literature along with all the possible adverse reactions/side effects for all the Medicines you take and that have been prescribed to you. Take any new Medicines after you have completely understood and accpet all the possible adverse reactions/side effects.   Do not drive or operate heavy machinery when taking Pain medications.   Do not take more than prescribed Pain, Sleep and Anxiety Medications  Special Instructions: If you have smoked or chewed Tobacco  in the last 2 yrs please stop smoking, stop any regular Alcohol  and or any Recreational drug use.  Wear Seat belts while driving.  Please note You were cared for by a hospitalist during your hospital stay. If you have any questions about your discharge medications or the care you received while you were in the hospital after you are discharged, you can call the unit and asked to speak with the hospitalist on call if the hospitalist that took care of you is not available. Once you are discharged, your primary care physician will handle any further medical issues. Please note that NO REFILLS for any discharge medications will be authorized once you are discharged, as it is imperative that you return to your primary care physician (or establish a relationship with a primary care physician if you do not have one) for your aftercare needs so that they can reassess your need for medications and monitor your lab values.  You can reach the hospitalist office at phone [phone] or fax [phone]   If you do not have a primary care physician, you can  call [phone] for a physician referral.  Activity: As tolerated with Full fall precautions use walker/cane & assistance as needed    Diet: regular  Disposition Home       Moderate Conscious Sedation, Adult, Care After These instructions provide you with information about caring for yourself after your procedure. Your health care provider may also give you more specific instructions. Your treatment has been planned according to current medical practices, but problems sometimes occur.  Call your health care provider if you have any problems or questions after your procedure. What can I expect after the procedure? After your procedure, it is common:  To feel sleepy for several hours.  To feel clumsy and have poor balance for several hours.  To have poor judgment for several hours.  To vomit if you eat too soon. Follow these instructions at home: For at least 24 hours after the procedure:   Do not: ? Participate in activities where you could fall or become injured. ? Drive. ? Use heavy machinery. ? Drink alcohol. ? Take sleeping pills or medicines that cause drowsiness. ? Make important decisions or sign legal documents. ? Take care of children on your own.  Rest. Eating and drinking  Follow the diet recommended by your health care provider.  If you vomit: ? Drink water, juice, or soup when you can drink without vomiting. ? Make sure you have little or no nausea before eating solid foods. General instructions  Have a responsible adult stay with you until you are awake and alert.  Take over-the-counter and prescription medicines only as told by your health care provider.  If you smoke, do not smoke without supervision.  Keep all follow-up visits as told by your health care provider. This is important. Contact a health care provider if:  You keep feeling nauseous or you keep vomiting.  You feel light-headed.  You develop a rash.  You have a fever. Get help  right away if:  You have trouble breathing. This information is not intended to replace advice given to you by your health care provider. Make sure you discuss any questions you have with your health care provider. Document Released: 11/07/2012 Document Revised: 06/22/2015 Document Reviewed: 05/09/2015 Elsevier Interactive Patient Education  2019 [employer].

## 2018-07-11 LAB — CBC WITH DIFFERENTIAL/PLATELET
Abs Immature Granulocytes: 0.25 10*3/uL — ABNORMAL HIGH (ref 0.00–0.07)
Basophils Absolute: 0 10*3/uL (ref 0.0–0.1)
Basophils Relative: 0 %
Eosinophils Absolute: 0 10*3/uL (ref 0.0–0.5)
Eosinophils Relative: 0 %
HCT: 37.9 % (ref 36.0–46.0)
Hemoglobin: 11.3 g/dL — ABNORMAL LOW (ref 12.0–15.0)
Immature Granulocytes: 2 %
Lymphocytes Relative: 13 %
Lymphs Abs: 1.3 10*3/uL (ref 0.7–4.0)
MCH: 27.6 pg (ref 26.0–34.0)
MCHC: 29.8 g/dL — ABNORMAL LOW (ref 30.0–36.0)
MCV: 92.7 fL (ref 80.0–100.0)
Monocytes Absolute: 0.5 10*3/uL (ref 0.1–1.0)
Monocytes Relative: 5 %
Neutro Abs: 8.2 10*3/uL — ABNORMAL HIGH (ref 1.7–7.7)
Neutrophils Relative %: 80 %
Platelets: 408 10*3/uL — ABNORMAL HIGH (ref 150–400)
RBC: 4.09 MIL/uL (ref 3.87–5.11)
RDW: 14.4 % (ref 11.5–15.5)
WBC: 10.3 10*3/uL (ref 4.0–10.5)
nRBC: 0 % (ref 0.0–0.2)

## 2018-07-11 LAB — COMPREHENSIVE METABOLIC PANEL
ALT: 38 U/L (ref 0–44)
AST: 26 U/L (ref 15–41)
Albumin: 2.9 g/dL — ABNORMAL LOW (ref 3.5–5.0)
Alkaline Phosphatase: 58 U/L (ref 38–126)
Anion gap: 9 (ref 5–15)
BUN: 30 mg/dL — ABNORMAL HIGH (ref 6–20)
CO2: 18 mmol/L — ABNORMAL LOW (ref 22–32)
Calcium: 7.9 mg/dL — ABNORMAL LOW (ref 8.9–10.3)
Chloride: 115 mmol/L — ABNORMAL HIGH (ref 98–111)
Creatinine, Ser: 1.03 mg/dL — ABNORMAL HIGH (ref 0.44–1.00)
GFR calc Af Amer: >60 mL/min (ref >60)
GFR calc non Af Amer: >60 mL/min (ref >60)
Glucose, Bld: 260 mg/dL — ABNORMAL HIGH (ref 70–99)
Potassium: 3.3 mmol/L — ABNORMAL LOW (ref 3.5–5.1)
Sodium: 142 mmol/L (ref 135–145)
Total Bilirubin: 0.3 mg/dL (ref 0.3–1.2)
Total Protein: 6.8 g/dL (ref 6.5–8.1)

## 2018-07-11 LAB — C-REACTIVE PROTEIN: CRP: 6.9 mg/dL — ABNORMAL HIGH (ref <1.0)

## 2018-07-11 LAB — GLUCOSE, CAPILLARY
Glucose-Capillary: 230 mg/dL — ABNORMAL HIGH (ref 70–99)
Glucose-Capillary: 294 mg/dL — ABNORMAL HIGH (ref 70–99)
Glucose-Capillary: 287 mg/dL — ABNORMAL HIGH (ref 70–99)
Glucose-Capillary: 245 mg/dL — ABNORMAL HIGH (ref 70–99)

## 2018-07-11 LAB — INTERLEUKIN-6, PLASMA: Interleukin-6, Plasma: 33.1 pg/mL — ABNORMAL HIGH (ref 0.0–12.2)

## 2018-07-11 LAB — D-DIMER, QUANTITATIVE: D-Dimer, Quant: 2.2 ug/mL-FEU — ABNORMAL HIGH (ref 0.00–0.50)

## 2018-07-11 MED ORDER — POTASSIUM CHLORIDE CRYS ER 20 MEQ PO TBCR
40.0000 meq | EXTENDED_RELEASE_TABLET | Freq: Once | ORAL | Status: AC
  Administered 2018-07-11: 40 meq via ORAL
  Filled 2018-07-11: qty 2

## 2018-07-11 MED ORDER — INSULIN GLARGINE 100 UNIT/ML ~~LOC~~ SOLN
20.0000 [IU] | Freq: Every day | SUBCUTANEOUS | Status: DC
  Administered 2018-07-12: 09:00:00 20 [IU] via SUBCUTANEOUS
  Filled 2018-07-11: qty 0.2

## 2018-07-11 MED ORDER — INSULIN ASPART 100 UNIT/ML ~~LOC~~ SOLN
3.0000 [IU] | Freq: Three times a day (TID) | SUBCUTANEOUS | Status: DC
  Administered 2018-07-11 – 2018-07-12 (×2): 3 [IU] via SUBCUTANEOUS

## 2018-07-11 MED ORDER — ENOXAPARIN SODIUM 60 MG/0.6ML ~~LOC~~ SOLN
0.5000 mg/kg | SUBCUTANEOUS | Status: DC
  Administered 2018-07-11 – 2018-07-13 (×3): 55 mg via SUBCUTANEOUS
  Filled 2018-07-11 (×3): qty 0.55
  Filled 2018-07-11: qty 0.6

## 2018-07-11 MED ORDER — LORAZEPAM 0.5 MG PO TABS
0.5000 mg | ORAL_TABLET | Freq: Once | ORAL | Status: AC
  Administered 2018-07-11: 0.5 mg via ORAL
  Filled 2018-07-11: qty 1

## 2018-07-11 NOTE — Progress Notes (Signed)
PROGRESS NOTE    Leah Farmer  HKV:[id number] DOB: [birth year] DOA: 07/08/2018 PCP: Margy Clarks, NP     Brief Narrative:  Leah Farmer is a 53 year old female with past medical history significant for type 2 diabetes, history of nephrolithiasis who presented to the hospital with chief complaint of bilateral flank pain radiating to her groin since this past Monday.  She also admitted to some nausea, vomiting without any fevers or chills.  She has had a dry cough and some shortness of breath.  In the emergency department, CT renal stone revealed left nephrolithiasis with moderate left hydronephrosis and hydroureter.  COVID testing was positive.  Patient transferred to [facility] for urological evaluation.  New events last 24 hours / Subjective: Patient still with shortness of breath, still requiring nasal cannula 2 L O2.  Denies any further flank pain, nausea/vomiting, denies any fever/chills.  Assessment & Plan:   Principal Problem:   COVID-19 virus infection Active Problems:   Nausea & vomiting   Acute flank pain   Flank pain   Nephrolithiasis   Hydronephrosis, left   Acute lower UTI   Tachycardia   CAP (community acquired pneumonia)   Hypokalemia   Abnormal liver function   Left nephrolithiasis with left hydronephrosis -Urology consulted -No nephrostomy tube placed due to recent passage of at least 3 stones in urine, subsequent ultrasound showing normal left kidney with no hydronephrosis -Urine culture showed multiple species present -Continue Rocephin  COVID-19 positive -Currently requiring 2L [place] O2  -Chest x-ray consistent with COVID infection -Ferritin elevated 461, CRP elevated 16.6, sed rate elevated 65, d-dimer elevated 0.95 procalcitonin negative, -Continue on IV Solu-Medrol 60 mg every 12h, Remdesivir -Daily CRP and d-dimer -Prone daily  Acute hypoxemic respiratory failure -Due to COVID-19  Hypokalemia -Replace prn  DM type 2 -With  hyperglycemia while on steroids -Lantus, sliding scale insulin dose, novolog TID  Morbid obesity Advised lifestyle modification   DVT prophylaxis: SCD Code Status: Full code Family Communication: None Disposition Plan: Transfer to [facility] for treatment of COVID-19 and respiratory failure   Consultants:   Urology  IR  Procedures:   None  Antimicrobials:  Anti-infectives (From admission, onward)   Start     Dose/Rate Route Frequency Ordered Stop   07/10/18 1000  remdesivir 100 mg in sodium chloride 0.9 % 230 mL IVPB     100 mg 500 mL/hr over 30 Minutes Intravenous Every 24 hours 07/09/18 0823 07/14/18 1359   07/09/18 1000  remdesivir 200 mg in sodium chloride 0.9 % 210 mL IVPB     200 mg 500 mL/hr over 30 Minutes Intravenous Once 07/09/18 0823 07/09/18 1258   07/09/18 0900  cefTRIAXone (ROCEPHIN) 1 g in sodium chloride 0.9 % 100 mL IVPB     1 g 200 mL/hr over 30 Minutes Intravenous Every 24 hours 07/09/18 0748     07/08/18 2100  azithromycin (ZITHROMAX) 500 mg in sodium chloride 0.9 % 250 mL IVPB     500 mg 250 mL/hr over 60 Minutes Intravenous  Once 07/08/18 2058 07/08/18 2304   07/08/18 1730  cefTRIAXone (ROCEPHIN) 1 g in sodium chloride 0.9 % 100 mL IVPB     1 g 200 mL/hr over 30 Minutes Intravenous  Once 07/08/18 1719 07/08/18 1909       Objective: Vitals:   07/10/18 1720 07/10/18 2227 07/11/18 0901 07/11/18 1414  BP: 130/87 (!) 143/93 (!) 147/81 (!) 153/96  Pulse: 84 80 62 61  Resp: (!) 23 20  20  Temp:  98.6 F (37 C)  98.5 F (36.9 C)  TempSrc:  Oral  Oral  SpO2: 95% 97% 96% 95%  Weight:      Height:        Intake/Output Summary (Last 24 hours) at 07/11/2018 1533 Last data filed at 07/11/2018 1001 Gross per 24 hour  Intake 1847.8 ml  Output -  Net 1847.8 ml   Filed Weights   07/08/18 1546 07/09/18 0510  Weight: 113.4 kg 113.4 kg    Examination:  General: NAD   Cardiovascular: S1, S2 present  Respiratory: CTAB  Abdomen:  Soft, nontender, nondistended, bowel sounds present  Musculoskeletal: No bilateral pedal edema noted  Skin: Normal  Psychiatry: Normal mood   Data Reviewed: I have personally reviewed following labs and imaging studies  CBC: Recent Labs  Lab 07/08/18 1610 07/09/18 1037 07/10/18 0734 07/11/18 0620  WBC 9.4 9.0 5.7 10.3  NEUTROABS 7.5 7.7 4.4 8.2*  HGB 12.5 11.4* 11.6* 11.3*  HCT 38.7 36.2 37.4 37.9  MCV 87.0 91.0 91.7 92.7  PLT 345 316 346 025*   Basic Metabolic Panel: Recent Labs  Lab 07/08/18 1610 07/09/18 1037 07/10/18 0734 07/11/18 0620  NA 137 139 139 142  K 2.7* 2.9* 3.3* 3.3*  CL 101 110 113* 115*  CO2 20* 18* 16* 18*  GLUCOSE 233* 197* 334* 260*  BUN 16 13 24* 30*  CREATININE 0.95 0.81 0.99 1.03*  CALCIUM 8.4* 7.5* 7.7* 7.9*  MG  --  1.7 1.7  --   PHOS  --  1.6*  --   --    GFR: Estimated Creatinine Clearance: 79.8 mL/min (A) (by C-G formula based on SCr of 1.03 mg/dL (H)). Liver Function Tests: Recent Labs  Lab 07/08/18 1610 07/09/18 1037 07/10/18 0734 07/11/18 0620  AST 46* 31 27 26   ALT 59* 43 39 38  ALKPHOS 78 63 64 58  BILITOT 0.5 0.3 0.3 0.3  PROT 8.1 6.8 7.1 6.8  ALBUMIN 3.4* 2.8* 2.8* 2.9*   No results for input(s): LIPASE, AMYLASE in the last 168 hours. No results for input(s): AMMONIA in the last 168 hours. Coagulation Profile: Recent Labs  Lab 07/09/18 1037  INR 1.0   Cardiac Enzymes: Recent Labs  Lab 07/08/18 1616  CKTOTAL 32*  CKMB 3.5   BNP (last 3 results) No results for input(s): PROBNP in the last 8760 hours. HbA1C: No results for input(s): HGBA1C in the last 72 hours. CBG: Recent Labs  Lab 07/10/18 1213 07/10/18 1811 07/10/18 2223 07/11/18 0858 07/11/18 1214  GLUCAP 245* 261* 267* 230* 294*   Lipid Profile: No results for input(s): CHOL, HDL, LDLCALC, TRIG, CHOLHDL, LDLDIRECT in the last 72 hours. Thyroid Function Tests: No results for input(s): TSH, T4TOTAL, FREET4, T3FREE, THYROIDAB in the last 72  hours. Anemia Panel: Recent Labs    07/08/18 1616  FERRITIN 461*   Sepsis Labs: Recent Labs  Lab 07/08/18 1616  PROCALCITON <0.10    Recent Results (from the past 240 hour(s))  Urine Culture     Status: Abnormal   Collection Time: 07/08/18  5:19 PM  Result Value Ref Range Status   Specimen Description   Final    URINE, CLEAN CATCH Performed at [facility], [address], [place], [place] [address]    Special Requests   Final    NONE Performed at [facility], [address] [place], [place] [address]    Culture (A)  Final    30,000 COLONIES/mL MULTIPLE SPECIES PRESENT,  SUGGEST RECOLLECTION   Report Status 07/09/2018 FINAL  Final  SARS Coronavirus 2 (CEPHEID- Performed in [place] hospital lab), Hosp Order     Status: Abnormal   Collection Time: 07/08/18  6:21 PM  Result Value Ref Range Status   SARS Coronavirus 2 POSITIVE (A) NEGATIVE Final    Comment: RESULT CALLED TO, READ BACK BY AND VERIFIED WITH: DOSS,M @ 2116 ON 07/08/18 BY JUW (NOTE) If result is NEGATIVE SARS-CoV-2 target nucleic acids are NOT DETECTED. The SARS-CoV-2 RNA is generally detectable in upper and lower  respiratory specimens during the acute phase of infection. The lowest  concentration of SARS-CoV-2 viral copies this assay can detect is 250  copies / mL. A negative result does not preclude SARS-CoV-2 infection  and should not be used as the sole basis for treatment or other  patient management decisions.  A negative result may occur with  improper specimen collection / handling, submission of specimen other  than nasopharyngeal swab, presence of viral mutation(s) within the  areas targeted by this assay, and inadequate number of viral copies  (<250 copies / mL). A negative result must be combined with clinical  observations, patient history, and epidemiological information. If result is POSITIVE SARS-CoV-2 target nucleic acids are DETECTED. The SA RS-CoV-2 RNA is generally detectable in upper  and lower  respiratory specimens during the acute phase of infection.  Positive  results are indicative of active infection with SARS-CoV-2.  Clinical  correlation with patient history and other diagnostic information is  necessary to determine patient infection status.  Positive results do  not rule out bacterial infection or co-infection with other viruses. If result is PRESUMPTIVE POSTIVE SARS-CoV-2 nucleic acids MAY BE PRESENT.   A presumptive positive result was obtained on the submitted specimen  and confirmed on repeat testing.  While 2019 novel coronavirus  (SARS-CoV-2) nucleic acids may be present in the submitted sample  additional confirmatory testing may be necessary for epidemiological  and / or clinical management purposes  to differentiate between  SARS-CoV-2 and other Sarbecovirus currently known to infect humans.  If clinically indicated additional testing with an alternate test  methodology [phone]) is advi sed. The SARS-CoV-2 RNA is generally  detectable in upper and lower respiratory specimens during the acute  phase of infection. The expected result is Negative. Fact Sheet for Patients:  StrictlyIdeas.no Fact Sheet for Healthcare Providers: BankingDealers.co.za This test is not yet approved or cleared by the [place] FDA and has been authorized for detection and/or diagnosis of SARS-CoV-2 by FDA under an Emergency Use Authorization (EUA).  This EUA will remain in effect (meaning this test can be used) for the duration of the COVID-19 declaration under Section 564(b)(1) of the Act, 21 U.S.C. section 360bbb-3(b)(1), unless the authorization is terminated or revoked sooner. Performed at [facility], [address] [place], [place] [address]   Surgical PCR screen     Status: Abnormal   Collection Time: 07/09/18 11:33 PM  Result Value Ref Range Status   MRSA, PCR NEGATIVE NEGATIVE Final   Staphylococcus aureus  POSITIVE (A) NEGATIVE Final    Comment: (NOTE) The Xpert SA Assay (FDA approved for NASAL specimens in patients 1 years of age and older), is one component of a comprehensive surveillance program. It is not intended to diagnose infection nor to guide or monitor treatment. Performed at [facility], [place] [address] [place], [place] [address]        Radiology Studies: Ir Abdomen US Limited  Result Date: 07/11/2018 CLINICAL DATA:  Left-sided hydronephrosis secondary to distal ureteral calculi. The patient presents for left percutaneous nephrostomy tube placement. Just prior to the procedure she states that she has passed approximately 3 kidney stones earlier today. EXAM: ULTRASOUND ABDOMEN LIMITED COMPARISON:  CT of the abdomen and pelvis on 07/08/2018 FINDINGS: Ultrasound performed of the left kidney prior to possible nephrostomy demonstrates a normal kidney without evidence of hydronephrosis. No masses or shadowing intrarenal calculi identified by ultrasound. IMPRESSION: Normal left renal ultrasound demonstrating resolution of hydronephrosis seen by prior CT. The patient passed several kidney stones earlier today. Given resolution of hydronephrosis, percutaneous nephrostomy tube placement was canceled and not performed. Electronically Signed   By: Aletta Edouard M.D.   On: 07/11/2018 09:56      Scheduled Meds: . enoxaparin (LOVENOX) injection  0.5 mg/kg Subcutaneous Q24H  . insulin aspart  0-15 Units Subcutaneous TID WC  . insulin aspart  0-5 Units Subcutaneous QHS  . insulin glargine  15 Units Subcutaneous Daily  . methylPREDNISolone (SOLU-MEDROL) injection  60 mg Intravenous Q12H  . metoprolol succinate  50 mg Oral Daily  . mupirocin ointment  1 application Nasal BID  . norgestrel-ethinyl estradiol  1 tablet Oral Daily  . pantoprazole  40 mg Oral Daily   Continuous Infusions: . sodium chloride 100 mL/hr at 07/11/18 0634  . cefTRIAXone (ROCEPHIN)  IV 1 g  (07/11/18 0919)  . remdesivir 100 mg in NS 250 mL 100 mg (07/11/18 1453)     LOS: 3 days    Time spent: 25 minutes   Alma Friendly, MD [facility] [email or web address] 07/11/2018, 3:33 PM

## 2018-07-11 NOTE — Progress Notes (Signed)
Inpatient Diabetes Program Recommendations  AACE/ADA: New Consensus Statement on Inpatient Glycemic Control (2015)  Target Ranges:  Prepandial:   less than 140 mg/dL      Peak postprandial:   less than 180 mg/dL (1-2 hours)      Critically ill patients:  140 - 180 mg/dL   Lab Results  Component Value Date   GLUCAP 230 (H) 07/11/2018    Review of Glycemic Control Results for Leah Farmer, Leah Farmer (MRN [id number]) as of 07/11/2018 11:07  Ref. Range 07/10/2018 07:48 07/10/2018 12:13 07/10/2018 18:11 07/10/2018 22:23 07/11/2018 08:58  Glucose-Capillary Latest Ref Range: 70 - 99 mg/dL 299 (H) 245 (H) 261 (H) 267 (H) 230 (H)   Inpatient Diabetes Program Recommendations:   -Increase Lantus to 20 units -Add Novolog 3 units tid meal coverage if eats 50%  Thank you, Bethena Roys E. Damari Hiltz, RN, MSN, CDE  Diabetes Coordinator Inpatient Glycemic Control Team Team Pager [phone] (8am-5pm) 07/11/2018 11:07 AM

## 2018-07-11 NOTE — Progress Notes (Signed)
Urology progress note (virtual):  S: Spoke to patient over the phone regarding her recent passage of kidney stones. She passed 6 stones over the course of yesterday prior to IR evaluation for PCN and reports her pain and nausea have completely resolved.  O: BP (!) 153/96 (BP Location: Right Arm)   Pulse 61   Temp 98.5 F (36.9 C) (Oral)   Resp 20   Ht 5\' 4"  (1.626 m)   Wt 113.4 kg   SpO2 95%   BMI 42.91 kg/m  Patient was not examined during this phone interview.  IR ultrasound 07/10/18 demonstrated resolution of hydronephrosis.   A/P:  - Given resolution of patient's symptoms, passage of stones, and resolution of hydronephrosis, no further urologic intervention is currently indicated. Patient is relieved regarding this plan.  - Will arrange outpatient follow-up in approximately 1 month after she recovers from COVID-19 to discuss any need for further imaging and kidney stone prevention. - Discussed with patient return precautions, including recurrent flank pain or nausea.

## 2018-07-11 NOTE — Plan of Care (Signed)

## 2018-07-12 DIAGNOSIS — N3000 Acute cystitis without hematuria: Secondary | ICD-10-CM

## 2018-07-12 LAB — CBC
HCT: 35.5 % — ABNORMAL LOW (ref 36.0–46.0)
Hemoglobin: 11 g/dL — ABNORMAL LOW (ref 12.0–15.0)
MCH: 27.7 pg (ref 26.0–34.0)
MCHC: 31 g/dL (ref 30.0–36.0)
MCV: 89.4 fL (ref 80.0–100.0)
Platelets: 348 10*3/uL (ref 150–400)
RBC: 3.97 MIL/uL (ref 3.87–5.11)
RDW: 14.3 % (ref 11.5–15.5)
WBC: 10.4 10*3/uL (ref 4.0–10.5)
nRBC: 0.2 % (ref 0.0–0.2)

## 2018-07-12 LAB — COMPREHENSIVE METABOLIC PANEL
ALT: 39 U/L (ref 0–44)
AST: 29 U/L (ref 15–41)
Albumin: 2.7 g/dL — ABNORMAL LOW (ref 3.5–5.0)
Alkaline Phosphatase: 58 U/L (ref 38–126)
Anion gap: 11 (ref 5–15)
BUN: 34 mg/dL — ABNORMAL HIGH (ref 6–20)
CO2: 16 mmol/L — ABNORMAL LOW (ref 22–32)
Calcium: 7.7 mg/dL — ABNORMAL LOW (ref 8.9–10.3)
Chloride: 113 mmol/L — ABNORMAL HIGH (ref 98–111)
Creatinine, Ser: 1.06 mg/dL — ABNORMAL HIGH (ref 0.44–1.00)
GFR calc Af Amer: >60 mL/min (ref >60)
GFR calc non Af Amer: >60 mL/min (ref >60)
Glucose, Bld: 313 mg/dL — ABNORMAL HIGH (ref 70–99)
Potassium: 3.6 mmol/L (ref 3.5–5.1)
Sodium: 140 mmol/L (ref 135–145)
Total Bilirubin: 0.2 mg/dL — ABNORMAL LOW (ref 0.3–1.2)
Total Protein: 6.3 g/dL — ABNORMAL LOW (ref 6.5–8.1)

## 2018-07-12 LAB — FIBRINOGEN: Fibrinogen: 505 mg/dL — ABNORMAL HIGH (ref 210–475)

## 2018-07-12 LAB — GLUCOSE, CAPILLARY
Glucose-Capillary: 281 mg/dL — ABNORMAL HIGH (ref 70–99)
Glucose-Capillary: 313 mg/dL — ABNORMAL HIGH (ref 70–99)
Glucose-Capillary: 364 mg/dL — ABNORMAL HIGH (ref 70–99)
Glucose-Capillary: 325 mg/dL — ABNORMAL HIGH (ref 70–99)

## 2018-07-12 LAB — LACTATE DEHYDROGENASE: LDH: 246 U/L — ABNORMAL HIGH (ref 98–192)

## 2018-07-12 LAB — C-REACTIVE PROTEIN: CRP: 3 mg/dL — ABNORMAL HIGH (ref <1.0)

## 2018-07-12 LAB — D-DIMER, QUANTITATIVE: D-Dimer, Quant: 2.92 ug/mL-FEU — ABNORMAL HIGH (ref 0.00–0.50)

## 2018-07-12 MED ORDER — INSULIN GLARGINE 100 UNIT/ML ~~LOC~~ SOLN
15.0000 [IU] | Freq: Every day | SUBCUTANEOUS | Status: DC
  Administered 2018-07-13 – 2018-07-14 (×2): 15 [IU] via SUBCUTANEOUS
  Filled 2018-07-12 (×2): qty 0.15

## 2018-07-12 MED ORDER — INSULIN ASPART 100 UNIT/ML ~~LOC~~ SOLN
3.0000 [IU] | Freq: Three times a day (TID) | SUBCUTANEOUS | Status: AC
  Administered 2018-07-12 (×2): 3 [IU] via SUBCUTANEOUS

## 2018-07-12 MED ORDER — INSULIN ASPART 100 UNIT/ML ~~LOC~~ SOLN: 3.0000 [IU] | Freq: Three times a day (TID) | SUBCUTANEOUS | Status: DC

## 2018-07-12 NOTE — Plan of Care (Signed)
  Problem: Education: Goal: Knowledge of risk factors and measures for prevention of condition will improve Outcome: Progressing   Problem: Coping: Goal: Psychosocial and spiritual needs will be supported Outcome: Progressing   Problem: Respiratory: Goal: Will maintain a patent airway Outcome: Progressing Goal: Complications related to the disease process, condition or treatment will be avoided or minimized Outcome: Progressing   Problem: Education: Goal: Knowledge of General Education information will improve Description: Including pain rating scale, medication(s)/side effects and non-pharmacologic comfort measures Outcome: Progressing   Problem: Health Behavior/Discharge Planning: Goal: Ability to manage health-related needs will improve Outcome: Progressing   Problem: Clinical Measurements: Goal: Ability to maintain clinical measurements within normal limits will improve Outcome: Progressing Goal: Will remain free from infection Outcome: Progressing Goal: Diagnostic test results will improve Outcome: Progressing Goal: Respiratory complications will improve Outcome: Progressing   Problem: Activity: Goal: Risk for activity intolerance will decrease Outcome: Progressing   Problem: Coping: Goal: Level of anxiety will decrease Outcome: Progressing   Problem: Pain Managment: Goal: General experience of comfort will improve Outcome: Progressing   Problem: Safety: Goal: Ability to remain free from injury will improve Outcome: Progressing   Problem: Skin Integrity: Goal: Risk for impaired skin integrity will decrease Outcome: Progressing   Problem: Clinical Measurements: Goal: Cardiovascular complication will be avoided Outcome: Completed/Met   Problem: Nutrition: Goal: Adequate nutrition will be maintained Outcome: Completed/Met   Problem: Elimination: Goal: Will not experience complications related to bowel motility Outcome: Completed/Met Goal: Will not  experience complications related to urinary retention Outcome: Completed/Met

## 2018-07-12 NOTE — Progress Notes (Signed)
PROGRESS NOTE  Leah Farmer ZOX:[id number] DOB: 10/08/1965 DOA: 07/08/2018 PCP: Margy Clarks, NP   LOS: 4 days   Brief Narrative / Interim history: Leah Farmer is a 53 year old female with past medical history significant for type 2 diabetes, history of nephrolithiasis who presented to the hospital with chief complaint of bilateral flank pain radiating to her groin since this past Monday.  She also admitted to some nausea, vomiting without any fevers or chills.  She has had a dry cough and some shortness of breath.  In the emergency department, CT renal stone revealed left nephrolithiasis with moderate left hydronephrosis and hydroureter.  COVID testing was positive.  Patient transferred to [facility] for urological evaluation.  IR was consulted and upon evaluation prior to placing a nephrostomy tube her hydronephrosis seems to have been resolved  Subjective: Feeling slightly better this morning, still appreciates dyspnea with just ambulating to the bathroom and back.  Complains of generalized weakness, fatigue  Assessment & Plan: Principal Problem:   COVID-19 virus infection Active Problems:   Nausea & vomiting   Acute flank pain   Flank pain   Nephrolithiasis   Hydronephrosis, left   Acute lower UTI   Tachycardia   CAP (community acquired pneumonia)   Hypokalemia   Abnormal liver function   Principal Problem Acute Hypoxic Respiratory Failure due to Covid-19 Viral Illness -Patient presented to the hospital with flank pain and urinary symptoms, however she also had weakness and developed shortness of breath.  She was Covid positive on admission. -She was placed on Remdesivir started on 6/8 -She was placed on steroids started on 6/8 and will discontinue today  The treatment plan and use of medications and known side effects were discussed with patient/family, they were clearly explained that there is no proven definitive treatment for COVID-19 infection, any  medications used here are based on published clinical articles/anecdotal data which are not peer-reviewed or randomized control trials.  Complete risks and long-term side effects are unknown, however in the best clinical judgment they seem to be of some clinical benefit rather than medical risks.  Patient agree with the treatment plan and want to receive the given medications.  COVID-19 Labs  Recent Labs    07/10/18 0734 07/11/18 0620 07/12/18 0759  DDIMER 1.47* 2.20* 2.92*  LDH  --   --  246*  CRP 15.0* 6.9* 3.0*    Lab Results  Component Value Date   SARSCOV2NAA POSITIVE (A) 07/08/2018   Active Problems Left nephrolithiasis with left hydronephrosis -Urology and IR were consulted, plans were in place for percutaneous nephrostomy tube placement however peri-procedurally she had imaging by IR which showed resolution of her hydronephrosis, patient has been passing several stones and procedure has been canceled. -She was placed on Rocephin for concern for UTI, urine culture showed multiple species present.  We will keep on antibiotics for now  Type 2 diabetes mellitus -Continue Lantus, sliding scale, stop steroids today and decrease scheduled mealtime insulin as well as Lantus starting tomorrow  Morbid obesity -Advised lifestyle modifications  Scheduled Meds: . enoxaparin (LOVENOX) injection  0.5 mg/kg Subcutaneous Q24H  . insulin aspart  0-15 Units Subcutaneous TID WC  . insulin aspart  0-5 Units Subcutaneous QHS  . insulin aspart  3 Units Subcutaneous TID WC  . insulin glargine  20 Units Subcutaneous Daily  . methylPREDNISolone (SOLU-MEDROL) injection  60 mg Intravenous Q12H  . metoprolol succinate  50 mg Oral Daily  . mupirocin ointment  1 application Nasal BID  .  norgestrel-ethinyl estradiol  1 tablet Oral Daily  . pantoprazole  40 mg Oral Daily   Continuous Infusions: . sodium chloride 100 mL/hr at 07/12/18 1100  . cefTRIAXone (ROCEPHIN)  IV 1 g (07/12/18 0908)  .  remdesivir 100 mg in NS 250 mL 100 mg (07/11/18 1453)   PRN Meds:.HYDROmorphone (DILAUDID) injection, ondansetron (ZOFRAN) IV, promethazine  DVT prophylaxis: Lovenox Code Status: Full code Family Communication: d/w husband over the phone Disposition Plan: home when ready   Consultants:   Urology  IR  Procedures:   None   Antimicrobials:  Ceftriaxone 6/8 >>  Remdesivir 6/8 >>   Objective: Vitals:   07/12/18 0405 07/12/18 0802 07/12/18 0900 07/12/18 1140  BP: (!) 155/97     Pulse: 64  63   Resp: 19  20   Temp: 98.3 F (36.8 C) (!) 97.5 F (36.4 C)  98.7 F (37.1 C)  TempSrc: Oral Oral  Axillary  SpO2: 95%  94%   Weight:      Height:        Intake/Output Summary (Last 24 hours) at 07/12/2018 1155 Last data filed at 07/12/2018 1100 Gross per 24 hour  Intake 2909.69 ml  Output -  Net 2909.69 ml   Filed Weights   07/08/18 1546 07/09/18 0510  Weight: 113.4 kg 113.4 kg    Examination:  Constitutional: NAD Eyes: PERRL, lids and conjunctivae normal ENMT: Mucous membranes are moist. Respiratory: Diminished breath sounds at the bases, no wheezing.  No crackles. Cardiovascular: Regular rate and rhythm, no murmurs / rubs / gallops. No LE edema. 2+ pedal pulses. No carotid bruits.  Abdomen: no tenderness. Bowel sounds positive.  Musculoskeletal: no clubbing / cyanosis.  Skin: no rashes, lesions, ulcers. No induration Neurologic: CN 2-12 grossly intact. Strength 5/5 in all 4.  Psychiatric: Normal judgment and insight. Alert and oriented x 3. Normal mood.    Data Reviewed: I have independently reviewed following labs and imaging studies   CBC: Recent Labs  Lab 07/08/18 1610 07/09/18 1037 07/10/18 0734 07/11/18 0620 07/12/18 0759  WBC 9.4 9.0 5.7 10.3 10.4  NEUTROABS 7.5 7.7 4.4 8.2*  --   HGB 12.5 11.4* 11.6* 11.3* 11.0*  HCT 38.7 36.2 37.4 37.9 35.5*  MCV 87.0 91.0 91.7 92.7 89.4  PLT 345 316 346 408* 209   Basic Metabolic Panel: Recent Labs  Lab  07/08/18 1610 07/09/18 1037 07/10/18 0734 07/11/18 0620 07/12/18 0759  NA 137 139 139 142 140  K 2.7* 2.9* 3.3* 3.3* 3.6  CL 101 110 113* 115* 113*  CO2 20* 18* 16* 18* 16*  GLUCOSE 233* 197* 334* 260* 313*  BUN 16 13 24* 30* 34*  CREATININE 0.95 0.81 0.99 1.03* 1.06*  CALCIUM 8.4* 7.5* 7.7* 7.9* 7.7*  MG  --  1.7 1.7  --   --   PHOS  --  1.6*  --   --   --    GFR: Estimated Creatinine Clearance: 77.5 mL/min (A) (by C-G formula based on SCr of 1.06 mg/dL (H)). Liver Function Tests: Recent Labs  Lab 07/08/18 1610 07/09/18 1037 07/10/18 0734 07/11/18 0620 07/12/18 0759  AST 46* 31 27 26 29   ALT 59* 43 39 38 39  ALKPHOS 78 63 64 58 58  BILITOT 0.5 0.3 0.3 0.3 0.2*  PROT 8.1 6.8 7.1 6.8 6.3*  ALBUMIN 3.4* 2.8* 2.8* 2.9* 2.7*   No results for input(s): LIPASE, AMYLASE in the last 168 hours. No results for input(s): AMMONIA in the last 168 hours.  Coagulation Profile: Recent Labs  Lab 07/09/18 1037  INR 1.0   Cardiac Enzymes: Recent Labs  Lab 07/08/18 1616  CKTOTAL 32*  CKMB 3.5   BNP (last 3 results) No results for input(s): PROBNP in the last 8760 hours. HbA1C: No results for input(s): HGBA1C in the last 72 hours. CBG: Recent Labs  Lab 07/11/18 1214 07/11/18 1755 07/11/18 2156 07/12/18 0800 07/12/18 1138  GLUCAP 294* 287* 245* 281* 313*   Lipid Profile: No results for input(s): CHOL, HDL, LDLCALC, TRIG, CHOLHDL, LDLDIRECT in the last 72 hours. Thyroid Function Tests: No results for input(s): TSH, T4TOTAL, FREET4, T3FREE, THYROIDAB in the last 72 hours. Anemia Panel: No results for input(s): VITAMINB12, FOLATE, FERRITIN, TIBC, IRON, RETICCTPCT in the last 72 hours. Urine analysis:    Component Value Date/Time   COLORURINE AMBER (A) 07/08/2018 1611   APPEARANCEUR CLOUDY (A) 07/08/2018 1611   LABSPEC 1.021 07/08/2018 1611   PHURINE 6.0 07/08/2018 1611   GLUCOSEU NEGATIVE 07/08/2018 1611   HGBUR MODERATE (A) 07/08/2018 1611   BILIRUBINUR NEGATIVE  07/08/2018 1611   KETONESUR NEGATIVE 07/08/2018 1611   PROTEINUR 100 (A) 07/08/2018 1611   NITRITE NEGATIVE 07/08/2018 1611   LEUKOCYTESUR TRACE (A) 07/08/2018 1611   Sepsis Labs: Invalid input(s): PROCALCITONIN, LACTICIDVEN  Recent Results (from the past 240 hour(s))  Urine Culture     Status: Abnormal   Collection Time: 07/08/18  5:19 PM   Specimen: Urine, Clean Catch  Result Value Ref Range Status   Specimen Description   Final    URINE, CLEAN CATCH Performed at [facility], [address] [place], [place] [address]    Special Requests   Final    NONE Performed at [facility], [address] [place], [place] [address]    Culture (A)  Final    30,000 COLONIES/mL MULTIPLE SPECIES PRESENT, SUGGEST RECOLLECTION   Report Status 07/09/2018 FINAL  Final  SARS Coronavirus 2 (CEPHEID- Performed in [place] hospital lab), Hosp Order     Status: Abnormal   Collection Time: 07/08/18  6:21 PM   Specimen: Nasopharyngeal Swab  Result Value Ref Range Status   SARS Coronavirus 2 POSITIVE (A) NEGATIVE Final    Comment: RESULT CALLED TO, READ BACK BY AND VERIFIED WITH: DOSS,M @ 2116 ON 07/08/18 BY JUW (NOTE) If result is NEGATIVE SARS-CoV-2 target nucleic acids are NOT DETECTED. The SARS-CoV-2 RNA is generally detectable in upper and lower  respiratory specimens during the acute phase of infection. The lowest  concentration of SARS-CoV-2 viral copies this assay can detect is 250  copies / mL. A negative result does not preclude SARS-CoV-2 infection  and should not be used as the sole basis for treatment or other  patient management decisions.  A negative result may occur with  improper specimen collection / handling, submission of specimen other  than nasopharyngeal swab, presence of viral mutation(s) within the  areas targeted by this assay, and inadequate number of viral copies  (<250 copies / mL). A negative result must be combined with clinical  observations, patient history, and  epidemiological information. If result is POSITIVE SARS-CoV-2 target nucleic acids are DETECTED. The SA RS-CoV-2 RNA is generally detectable in upper and lower  respiratory specimens during the acute phase of infection.  Positive  results are indicative of active infection with SARS-CoV-2.  Clinical  correlation with patient history and other diagnostic information is  necessary to determine patient infection status.  Positive results do  not rule out bacterial infection or co-infection  with other viruses. If result is PRESUMPTIVE POSTIVE SARS-CoV-2 nucleic acids MAY BE PRESENT.   A presumptive positive result was obtained on the submitted specimen  and confirmed on repeat testing.  While 2019 novel coronavirus  (SARS-CoV-2) nucleic acids may be present in the submitted sample  additional confirmatory testing may be necessary for epidemiological  and / or clinical management purposes  to differentiate between  SARS-CoV-2 and other Sarbecovirus currently known to infect humans.  If clinically indicated additional testing with an alternate test  methodology [phone]) is advi sed. The SARS-CoV-2 RNA is generally  detectable in upper and lower respiratory specimens during the acute  phase of infection. The expected result is Negative. Fact Sheet for Patients:  StrictlyIdeas.no Fact Sheet for Healthcare Providers: BankingDealers.co.za This test is not yet approved or cleared by the [place] FDA and has been authorized for detection and/or diagnosis of SARS-CoV-2 by FDA under an Emergency Use Authorization (EUA).  This EUA will remain in effect (meaning this test can be used) for the duration of the COVID-19 declaration under Section 564(b)(1) of the Act, 21 U.S.C. section 360bbb-3(b)(1), unless the authorization is terminated or revoked sooner. Performed at [facility], [address], [place], [place] [address]   Surgical PCR screen      Status: Abnormal   Collection Time: 07/09/18 11:33 PM   Specimen: Nasal Mucosa; Nasal Swab  Result Value Ref Range Status   MRSA, PCR NEGATIVE NEGATIVE Final   Staphylococcus aureus POSITIVE (A) NEGATIVE Final    Comment: (NOTE) The Xpert SA Assay (FDA approved for NASAL specimens in patients 50 years of age and older), is one component of a comprehensive surveillance program. It is not intended to diagnose infection nor to guide or monitor treatment. Performed at [facility], [place] [address] [place], [place] [address]       Radiology Studies: Ir Abdomen US Limited  Result Date: 07/11/2018 CLINICAL DATA:  Left-sided hydronephrosis secondary to distal ureteral calculi. The patient presents for left percutaneous nephrostomy tube placement. Just prior to the procedure she states that she has passed approximately 3 kidney stones earlier today. EXAM: ULTRASOUND ABDOMEN LIMITED COMPARISON:  CT of the abdomen and pelvis on 07/08/2018 FINDINGS: Ultrasound performed of the left kidney prior to possible nephrostomy demonstrates a normal kidney without evidence of hydronephrosis. No masses or shadowing intrarenal calculi identified by ultrasound. IMPRESSION: Normal left renal ultrasound demonstrating resolution of hydronephrosis seen by prior CT. The patient passed several kidney stones earlier today. Given resolution of hydronephrosis, percutaneous nephrostomy tube placement was canceled and not performed. Electronically Signed   By: Aletta Edouard M.D.   On: 07/11/2018 09:56     Marzetta Board, MD, PhD [facility]  Contact via  [email or web address]  [place] P: [phone]  F: [phone]

## 2018-07-12 NOTE — Progress Notes (Signed)
I spoke with the patient's husband Josh over the phone.  He expressed some concerns and questions about why the patient has not been discharged.  I answered what I could and provided teaching.  He asked to speak with the physician.  I have asked the physician to contact him.

## 2018-07-12 NOTE — Care Management (Signed)
Case manager will continue to monitor patient for appropriate disposition as she medically improves. May she be blessed to continually do so.    Ricki Miller, RN BSN Case Manager [phone]

## 2018-07-12 NOTE — Plan of Care (Signed)
  Problem: Education: Goal: Knowledge of risk factors and measures for prevention of condition will improve Outcome: Progressing   Problem: Coping: Goal: Psychosocial and spiritual needs will be supported Outcome: Progressing   Problem: Respiratory: Goal: Will maintain a patent airway Outcome: Progressing Goal: Complications related to the disease process, condition or treatment will be avoided or minimized Outcome: Progressing   Problem: Education: Goal: Knowledge of General Education information will improve Description: Including pain rating scale, medication(s)/side effects and non-pharmacologic comfort measures Outcome: Progressing   Problem: Health Behavior/Discharge Planning: Goal: Ability to manage health-related needs will improve Outcome: Progressing   Problem: Clinical Measurements: Goal: Ability to maintain clinical measurements within normal limits will improve Outcome: Progressing Goal: Will remain free from infection Outcome: Progressing Goal: Diagnostic test results will improve Outcome: Progressing Goal: Respiratory complications will improve Outcome: Progressing   Problem: Activity: Goal: Risk for activity intolerance will decrease Outcome: Progressing   Problem: Coping: Goal: Level of anxiety will decrease Outcome: Progressing

## 2018-07-13 LAB — BASIC METABOLIC PANEL
Anion gap: 8 (ref 5–15)
BUN: 29 mg/dL — ABNORMAL HIGH (ref 6–20)
CO2: 21 mmol/L — ABNORMAL LOW (ref 22–32)
Calcium: 7.9 mg/dL — ABNORMAL LOW (ref 8.9–10.3)
Chloride: 108 mmol/L (ref 98–111)
Creatinine, Ser: 0.98 mg/dL (ref 0.44–1.00)
GFR calc Af Amer: >60 mL/min (ref >60)
GFR calc non Af Amer: >60 mL/min (ref >60)
Glucose, Bld: 243 mg/dL — ABNORMAL HIGH (ref 70–99)
Potassium: 2.9 mmol/L — ABNORMAL LOW (ref 3.5–5.1)
Sodium: 137 mmol/L (ref 135–145)

## 2018-07-13 LAB — GLUCOSE, CAPILLARY
Glucose-Capillary: 227 mg/dL — ABNORMAL HIGH (ref 70–99)
Glucose-Capillary: 215 mg/dL — ABNORMAL HIGH (ref 70–99)
Glucose-Capillary: 193 mg/dL — ABNORMAL HIGH (ref 70–99)
Glucose-Capillary: 277 mg/dL — ABNORMAL HIGH (ref 70–99)

## 2018-07-13 LAB — D-DIMER, QUANTITATIVE: D-Dimer, Quant: 3.14 ug/mL-FEU — ABNORMAL HIGH (ref 0.00–0.50)

## 2018-07-13 LAB — C-REACTIVE PROTEIN: CRP: 1.7 mg/dL — ABNORMAL HIGH (ref <1.0)

## 2018-07-13 LAB — FERRITIN: Ferritin: 278 ng/mL (ref 11–307)

## 2018-07-13 MED ORDER — POTASSIUM CHLORIDE CRYS ER 20 MEQ PO TBCR
30.0000 meq | EXTENDED_RELEASE_TABLET | ORAL | Status: AC
  Administered 2018-07-13 (×3): 30 meq via ORAL
  Filled 2018-07-13 (×3): qty 2

## 2018-07-13 MED ORDER — FUROSEMIDE 10 MG/ML IJ SOLN
20.0000 mg | Freq: Once | INTRAMUSCULAR | Status: AC
  Administered 2018-07-13: 20 mg via INTRAVENOUS
  Filled 2018-07-13: qty 2

## 2018-07-13 NOTE — Plan of Care (Signed)
  Problem: Education: Goal: Knowledge of risk factors and measures for prevention of condition will improve Outcome: Progressing   Problem: Coping: Goal: Psychosocial and spiritual needs will be supported Outcome: Progressing   Problem: Respiratory: Goal: Will maintain a patent airway Outcome: Progressing Goal: Complications related to the disease process, condition or treatment will be avoided or minimized Outcome: Progressing   Problem: Education: Goal: Knowledge of General Education information will improve Description: Including pain rating scale, medication(s)/side effects and non-pharmacologic comfort measures Outcome: Progressing   Problem: Health Behavior/Discharge Planning: Goal: Ability to manage health-related needs will improve Outcome: Progressing   Problem: Clinical Measurements: Goal: Ability to maintain clinical measurements within normal limits will improve Outcome: Progressing Goal: Diagnostic test results will improve Outcome: Progressing Goal: Respiratory complications will improve Outcome: Progressing   Problem: Activity: Goal: Risk for activity intolerance will decrease Outcome: Progressing   Problem: Coping: Goal: Level of anxiety will decrease Outcome: Progressing   Problem: Pain Managment: Goal: General experience of comfort will improve Outcome: Progressing   Problem: Safety: Goal: Ability to remain free from injury will improve Outcome: Progressing   Problem: Skin Integrity: Goal: Risk for impaired skin integrity will decrease Outcome: Progressing

## 2018-07-13 NOTE — Plan of Care (Signed)
  Problem: Education: °Goal: Knowledge of risk factors and measures for prevention of condition will improve °Outcome: Progressing °  °Problem: Coping: °Goal: Psychosocial and spiritual needs will be supported °Outcome: Progressing °  °Problem: Respiratory: °Goal: Will maintain a patent airway °Outcome: Progressing °Goal: Complications related to the disease process, condition or treatment will be avoided or minimized °Outcome: Progressing °  °Problem: Education: °Goal: Knowledge of General Education information will improve °Description: Including pain rating scale, medication(s)/side effects and non-pharmacologic comfort measures °Outcome: Progressing °  °Problem: Health Behavior/Discharge Planning: °Goal: Ability to manage health-related needs will improve °Outcome: Progressing °  °Problem: Clinical Measurements: °Goal: Ability to maintain clinical measurements within normal limits will improve °Outcome: Progressing °Goal: Will remain free from infection °Outcome: Progressing °Goal: Diagnostic test results will improve °Outcome: Progressing °  °

## 2018-07-13 NOTE — Progress Notes (Addendum)
0819  Resting in bed.  O2 at 2l/[place].  O2 sats 95%.  Ambulating without O2 to toilet and back with some dyspnea.  On room air and ambulating O2 sat  92%.  Decreased O2 to 1L/[place]  1030  Resting in bed.  O2 sat 97% on 1L/[place].  Placed on room air.    1415  Pulse oximetry on room air is 95%.  Ambulated in hallway approximately 125 ft.  Lowest O2 sat 90% while ambulating.  Shortness of breath noted with exertion but patient states it is "much better than it was".  Once resting 2-3 minutes, O2 sat returned to 95%.

## 2018-07-13 NOTE — Progress Notes (Signed)
PROGRESS NOTE  Leah Farmer JOI:[id number] DOB: 09-11-1965 DOA: 07/08/2018 PCP: Margy Clarks, NP   LOS: 5 days   Brief Narrative / Interim history: [place] Viereck is a 53 year old female with past medical history significant for type 2 diabetes, history of nephrolithiasis who presented to the hospital with chief complaint of bilateral flank pain radiating to her groin since this past Monday.  She also admitted to some nausea, vomiting without any fevers or chills.  She has had a dry cough and some shortness of breath.  In the emergency department, CT renal stone revealed left nephrolithiasis with moderate left hydronephrosis and hydroureter.  COVID testing was positive.  Patient transferred to [facility] for urological evaluation.  IR was consulted and upon evaluation prior to placing a nephrostomy tube her hydronephrosis seems to have been resolved  Subjective: Continues to improve, feels better on room air at rest, dyspneic with exertion   Assessment & Plan: Principal Problem:   COVID-19 virus infection Active Problems:   Nausea & vomiting   Acute flank pain   Flank pain   Nephrolithiasis   Hydronephrosis, left   Acute lower UTI   Tachycardia   CAP (community acquired pneumonia)   Hypokalemia   Abnormal liver function   Principal Problem Acute Hypoxic Respiratory Failure due to Covid-19 Viral Illness -Patient presented to the hospital with flank pain and urinary symptoms, however she also had weakness and developed shortness of breath.  She was Covid positive on admission. -She was placed on Remdesivir started on 6/8 to be finished today -She was placed on steroids started on 6/8 and discontinued yesterday -Slightly fluid up, will give a dose of Lasix  The treatment plan and use of medications and known side effects were discussed with patient/family, they were clearly explained that there is no proven definitive treatment for COVID-19 infection, any medications  used here are based on published clinical articles/anecdotal data which are not peer-reviewed or randomized control trials.  Complete risks and long-term side effects are unknown, however in the best clinical judgment they seem to be of some clinical benefit rather than medical risks.  Patient agree with the treatment plan and want to receive the given medications.  COVID-19 Labs  Recent Labs    07/11/18 0620 07/12/18 0759 07/13/18 0940  DDIMER 2.20* 2.92* 3.14*  FERRITIN  --   --  278  LDH  --  246*  --   CRP 6.9* 3.0* 1.7*    Lab Results  Component Value Date   SARSCOV2NAA POSITIVE (A) 07/08/2018   Active Problems Left nephrolithiasis with left hydronephrosis -Urology and IR were consulted, plans were in place for percutaneous nephrostomy tube placement however peri-procedurally she had imaging by IR which showed resolution of her hydronephrosis, patient has been passing several stones and procedure has been canceled. -She was placed on Rocephin for concern for UTI, urine culture showed multiple species present.  We will keep on antibiotics for now, plan for short course on discharge  Hypokalemia -Replete  Type 2 diabetes mellitus -Continue Lantus, sliding scale, CBGs better after coming off steroids  Morbid obesity -Advised lifestyle modifications  Scheduled Meds: . enoxaparin (LOVENOX) injection  0.5 mg/kg Subcutaneous Q24H  . furosemide  20 mg Intravenous Once  . insulin aspart  0-15 Units Subcutaneous TID WC  . insulin aspart  0-5 Units Subcutaneous QHS  . insulin glargine  15 Units Subcutaneous Daily  . metoprolol succinate  50 mg Oral Daily  . mupirocin ointment  1 application Nasal  BID  . norgestrel-ethinyl estradiol  1 tablet Oral Daily  . pantoprazole  40 mg Oral Daily  . potassium chloride  30 mEq Oral Q1 Hr x 3   Continuous Infusions: . cefTRIAXone (ROCEPHIN)  IV 1 g (07/13/18 1030)   PRN Meds:.HYDROmorphone (DILAUDID) injection, ondansetron (ZOFRAN) IV,  promethazine  DVT prophylaxis: Lovenox Code Status: Full code Family Communication: d/w patient Disposition Plan: home tomorrow if stable  Consultants:   Urology  IR  Procedures:   None   Antimicrobials:  Ceftriaxone 6/8 >>  Remdesivir 6/8 >>   Objective: Vitals:   07/13/18 1206 07/13/18 1412 07/13/18 1422 07/13/18 1632  BP: (!) 142/75     Pulse: 63 63 69   Resp: 20 (!) 25 (!) 24   Temp:    (!) 97.3 F (36.3 C)  TempSrc:    Oral  SpO2: 92% 95% 95%   Weight:      Height:        Intake/Output Summary (Last 24 hours) at 07/13/2018 1646 Last data filed at 07/13/2018 1230 Gross per 24 hour  Intake 840 ml  Output -  Net 840 ml   Filed Weights   07/08/18 1546 07/09/18 0510  Weight: 113.4 kg 113.4 kg    Examination:  Constitutional: NAD Eyes: No scleral icterus ENMT: Moist mucous membranes Respiratory: Clear to auscultation, no crackles, no wheezing Cardiovascular: Regular rate and rhythm, no murmurs.  No edema Abdomen: Soft, nontender, nondistended, positive bowel sounds Musculoskeletal: no clubbing / cyanosis.  Skin: No rashes seen Neurologic: No focal deficits, equal strength Psychiatric: Normal judgment and insight. Alert and oriented x 3. Normal mood.    Data Reviewed: I have independently reviewed following labs and imaging studies   CBC: Recent Labs  Lab 07/08/18 1610 07/09/18 1037 07/10/18 0734 07/11/18 0620 07/12/18 0759  WBC 9.4 9.0 5.7 10.3 10.4  NEUTROABS 7.5 7.7 4.4 8.2*  --   HGB 12.5 11.4* 11.6* 11.3* 11.0*  HCT 38.7 36.2 37.4 37.9 35.5*  MCV 87.0 91.0 91.7 92.7 89.4  PLT 345 316 346 408* 144   Basic Metabolic Panel: Recent Labs  Lab 07/09/18 1037 07/10/18 0734 07/11/18 0620 07/12/18 0759 07/13/18 0940  NA 139 139 142 140 137  K 2.9* 3.3* 3.3* 3.6 2.9*  CL 110 113* 115* 113* 108  CO2 18* 16* 18* 16* 21*  GLUCOSE 197* 334* 260* 313* 243*  BUN 13 24* 30* 34* 29*  CREATININE 0.81 0.99 1.03* 1.06* 0.98  CALCIUM 7.5* 7.7*  7.9* 7.7* 7.9*  MG 1.7 1.7  --   --   --   PHOS 1.6*  --   --   --   --    GFR: Estimated Creatinine Clearance: 83.8 mL/min (by C-G formula based on SCr of 0.98 mg/dL). Liver Function Tests: Recent Labs  Lab 07/08/18 1610 07/09/18 1037 07/10/18 0734 07/11/18 0620 07/12/18 0759  AST 46* 31 27 26 29   ALT 59* 43 39 38 39  ALKPHOS 78 63 64 58 58  BILITOT 0.5 0.3 0.3 0.3 0.2*  PROT 8.1 6.8 7.1 6.8 6.3*  ALBUMIN 3.4* 2.8* 2.8* 2.9* 2.7*   No results for input(s): LIPASE, AMYLASE in the last 168 hours. No results for input(s): AMMONIA in the last 168 hours. Coagulation Profile: Recent Labs  Lab 07/09/18 1037  INR 1.0   Cardiac Enzymes: Recent Labs  Lab 07/08/18 1616  CKTOTAL 32*  CKMB 3.5   BNP (last 3 results) No results for input(s): PROBNP in the last 8760  hours. HbA1C: No results for input(s): HGBA1C in the last 72 hours. CBG: Recent Labs  Lab 07/12/18 1650 07/12/18 2057 07/13/18 0731 07/13/18 1143 07/13/18 1630  GLUCAP 364* 325* 227* 215* 193*   Lipid Profile: No results for input(s): CHOL, HDL, LDLCALC, TRIG, CHOLHDL, LDLDIRECT in the last 72 hours. Thyroid Function Tests: No results for input(s): TSH, T4TOTAL, FREET4, T3FREE, THYROIDAB in the last 72 hours. Anemia Panel: Recent Labs    07/13/18 0940  FERRITIN 278   Urine analysis:    Component Value Date/Time   COLORURINE AMBER (A) 07/08/2018 1611   APPEARANCEUR CLOUDY (A) 07/08/2018 1611   LABSPEC 1.021 07/08/2018 1611   PHURINE 6.0 07/08/2018 1611   GLUCOSEU NEGATIVE 07/08/2018 1611   HGBUR MODERATE (A) 07/08/2018 1611   BILIRUBINUR NEGATIVE 07/08/2018 1611   KETONESUR NEGATIVE 07/08/2018 1611   PROTEINUR 100 (A) 07/08/2018 1611   NITRITE NEGATIVE 07/08/2018 1611   LEUKOCYTESUR TRACE (A) 07/08/2018 1611   Sepsis Labs: Invalid input(s): PROCALCITONIN, LACTICIDVEN  Recent Results (from the past 240 hour(s))  Urine Culture     Status: Abnormal   Collection Time: 07/08/18  5:19 PM    Specimen: Urine, Clean Catch  Result Value Ref Range Status   Specimen Description   Final    URINE, CLEAN CATCH Performed at [facility], [address] [place], [place] [address]    Special Requests   Final    NONE Performed at [facility], [address] [place], [place] [address]    Culture (A)  Final    30,000 COLONIES/mL MULTIPLE SPECIES PRESENT, SUGGEST RECOLLECTION   Report Status 07/09/2018 FINAL  Final  SARS Coronavirus 2 (CEPHEID- Performed in [place] hospital lab), Hosp Order     Status: Abnormal   Collection Time: 07/08/18  6:21 PM   Specimen: Nasopharyngeal Swab  Result Value Ref Range Status   SARS Coronavirus 2 POSITIVE (A) NEGATIVE Final    Comment: RESULT CALLED TO, READ BACK BY AND VERIFIED WITH: DOSS,M @ 2116 ON 07/08/18 BY JUW (NOTE) If result is NEGATIVE SARS-CoV-2 target nucleic acids are NOT DETECTED. The SARS-CoV-2 RNA is generally detectable in upper and lower  respiratory specimens during the acute phase of infection. The lowest  concentration of SARS-CoV-2 viral copies this assay can detect is 250  copies / mL. A negative result does not preclude SARS-CoV-2 infection  and should not be used as the sole basis for treatment or other  patient management decisions.  A negative result may occur with  improper specimen collection / handling, submission of specimen other  than nasopharyngeal swab, presence of viral mutation(s) within the  areas targeted by this assay, and inadequate number of viral copies  (<250 copies / mL). A negative result must be combined with clinical  observations, patient history, and epidemiological information. If result is POSITIVE SARS-CoV-2 target nucleic acids are DETECTED. The SA RS-CoV-2 RNA is generally detectable in upper and lower  respiratory specimens during the acute phase of infection.  Positive  results are indicative of active infection with SARS-CoV-2.  Clinical  correlation with patient history and other  diagnostic information is  necessary to determine patient infection status.  Positive results do  not rule out bacterial infection or co-infection with other viruses. If result is PRESUMPTIVE POSTIVE SARS-CoV-2 nucleic acids MAY BE PRESENT.   A presumptive positive result was obtained on the submitted specimen  and confirmed on repeat testing.  While 2019 novel coronavirus  (SARS-CoV-2) nucleic acids may be present in  the submitted sample  additional confirmatory testing may be necessary for epidemiological  and / or clinical management purposes  to differentiate between  SARS-CoV-2 and other Sarbecovirus currently known to infect humans.  If clinically indicated additional testing with an alternate test  methodology [phone]) is advi sed. The SARS-CoV-2 RNA is generally  detectable in upper and lower respiratory specimens during the acute  phase of infection. The expected result is Negative. Fact Sheet for Patients:  StrictlyIdeas.no Fact Sheet for Healthcare Providers: BankingDealers.co.za This test is not yet approved or cleared by the [place] FDA and has been authorized for detection and/or diagnosis of SARS-CoV-2 by FDA under an Emergency Use Authorization (EUA).  This EUA will remain in effect (meaning this test can be used) for the duration of the COVID-19 declaration under Section 564(b)(1) of the Act, 21 U.S.C. section 360bbb-3(b)(1), unless the authorization is terminated or revoked sooner. Performed at [facility], [address] [place], [place] [address]   Surgical PCR screen     Status: Abnormal   Collection Time: 07/09/18 11:33 PM   Specimen: Nasal Mucosa; Nasal Swab  Result Value Ref Range Status   MRSA, PCR NEGATIVE NEGATIVE Final   Staphylococcus aureus POSITIVE (A) NEGATIVE Final    Comment: (NOTE) The Xpert SA Assay (FDA approved for NASAL specimens in patients 78 years of age and older), is one component of  a comprehensive surveillance program. It is not intended to diagnose infection nor to guide or monitor treatment. Performed at [facility], [place] [address] [place], [place] [address]       Radiology Studies: No results found.   Marzetta Board, MD, PhD [facility]  Contact via  [email or web address]  [place] P: [phone]  F: [phone]

## 2018-07-14 LAB — BASIC METABOLIC PANEL
Anion gap: 13 (ref 5–15)
BUN: 21 mg/dL — ABNORMAL HIGH (ref 6–20)
CO2: 21 mmol/L — ABNORMAL LOW (ref 22–32)
Calcium: 7.6 mg/dL — ABNORMAL LOW (ref 8.9–10.3)
Chloride: 106 mmol/L (ref 98–111)
Creatinine, Ser: 0.85 mg/dL (ref 0.44–1.00)
GFR calc Af Amer: >60 mL/min (ref >60)
GFR calc non Af Amer: >60 mL/min (ref >60)
Glucose, Bld: 188 mg/dL — ABNORMAL HIGH (ref 70–99)
Potassium: 3 mmol/L — ABNORMAL LOW (ref 3.5–5.1)
Sodium: 140 mmol/L (ref 135–145)

## 2018-07-14 LAB — GLUCOSE, CAPILLARY
Glucose-Capillary: 185 mg/dL — ABNORMAL HIGH (ref 70–99)
Glucose-Capillary: 216 mg/dL — ABNORMAL HIGH (ref 70–99)

## 2018-07-14 MED ORDER — CEFPODOXIME PROXETIL 200 MG PO TABS: 200.0000 mg | ORAL_TABLET | Freq: Two times a day (BID) | ORAL | 0 refills | Status: DC

## 2018-07-14 MED ORDER — POTASSIUM CHLORIDE CRYS ER 20 MEQ PO TBCR
40.0000 meq | EXTENDED_RELEASE_TABLET | ORAL | Status: AC
  Administered 2018-07-14 (×2): 40 meq via ORAL
  Filled 2018-07-14 (×2): qty 2

## 2018-07-14 NOTE — Progress Notes (Addendum)
0900  Sitting at edge of bed.  O2 sat 94% on room air.  Denies any needs.  1130  Resting in bed.  Discharge instructions given.  All questions answered.  IV removed intact.  Rx for Vantin given.  Compliance form for COVID home care signed.   Awaiting transport home  1258  Family here to transport patient home.

## 2018-07-14 NOTE — Plan of Care (Signed)
  Problem: Education: Goal: Knowledge of risk factors and measures for prevention of condition will improve Outcome: Adequate for Discharge   Problem: Coping: Goal: Psychosocial and spiritual needs will be supported Outcome: Adequate for Discharge   Problem: Respiratory: Goal: Will maintain a patent airway Outcome: Adequate for Discharge Goal: Complications related to the disease process, condition or treatment will be avoided or minimized Outcome: Adequate for Discharge   Problem: Education: Goal: Knowledge of General Education information will improve Description: Including pain rating scale, medication(s)/side effects and non-pharmacologic comfort measures Outcome: Adequate for Discharge   Problem: Health Behavior/Discharge Planning: Goal: Ability to manage health-related needs will improve Outcome: Adequate for Discharge   Problem: Clinical Measurements: Goal: Ability to maintain clinical measurements within normal limits will improve Outcome: Adequate for Discharge Goal: Will remain free from infection Outcome: Adequate for Discharge Goal: Diagnostic test results will improve Outcome: Adequate for Discharge Goal: Respiratory complications will improve Outcome: Adequate for Discharge   Problem: Activity: Goal: Risk for activity intolerance will decrease Outcome: Adequate for Discharge   Problem: Coping: Goal: Level of anxiety will decrease Outcome: Adequate for Discharge   Problem: Pain Managment: Goal: General experience of comfort will improve Outcome: Adequate for Discharge   Problem: Safety: Goal: Ability to remain free from injury will improve Outcome: Adequate for Discharge   Problem: Skin Integrity: Goal: Risk for impaired skin integrity will decrease Outcome: Adequate for Discharge

## 2018-07-14 NOTE — Discharge Summary (Signed)
Physician Discharge Summary  Leah Farmer ESP:[id number] DOB: 03-10-1965 DOA: 07/08/2018  PCP: Margy Clarks, NP  Admit date: 07/08/2018 Discharge date: 07/14/2018  Admitted From: home Disposition:  Home   Recommendations for Outpatient Follow-up:  1. Follow up with PCP in 1-2 weeks  Home Health: none Equipment/Devices: none  Discharge Condition: stable CODE STATUS: Full code Diet recommendation: regular  HPI: Per admitting MD, Leah Farmer  is a 53 y.o. female, w Dm2, h/o nephrolithiasis (passed by self), presents with c/o bilateral flank pain radiating to groin since this past Monday.  Pt also notes n/v, possibly slight dysuria.  No hematuria. No fever/ chills.  Pt notes slight dry cough, w slight dyspnea.  Pt denies cp, palp, abd pain, diarrhea, brbpr.  Pt presented primarily due to flank pain, and n/v.  In ED, T 99.9  P 130  R 27  Bp 106/74  Pox 92% on RA Wt 113.4 kg CT renal stone IMPRESSION: 1. There are numerous stacked calculi in the distal left ureter, measuring approximately 4.7 cm in total length, the largest discrete calculus measuring 1.4 cm (series 5, image 65). There is moderate left hydronephrosis and hydroureter. There is an additional nonobstructive calculus in the inferior pole of the left kidney. 2. There is extensive, irregular bibasilar opacity and consolidation in a peribronchovascular pattern, of uncertain etiology, possibly acutely infectious or inflammatory although chronic interstitial lung disease is a differential consideration. Recommend nonemergent ILD protocol CT examination of the chest to further evaluate. 3. Hepatic steatosis.  CXR IMPRESSION: 1. Unusual appearance in the lungs, which based on comparison with recent CT the abdomen and pelvis is favored to reflect widespread cryptogenic organizing pneumonia, likely sequela of recent atypical infection (including viral pneumonia such as COVID-19).  Wbc 9.4, Hgb 12.5, Plt 345 Na 137,  K 2.7   Bun  16, Creatinine 0.95 Hco3=20 Glucose 233  Ekg st at 120, LAD, nl int, no st- t changes c/w ischemia  Kcl 40 meq po x1 , dilaudid 0.5mg  iv x1 , phenergan 12.5mg  iv x1, reglan 10mg  iv x1 given in ED,  Rocephin 1mg  iv x1 given in ED, zithromax 500mg  iv x1 given in ED.   Pt will be admitted for n/v, bilateral flank pain secondary to nephrolithiasis with L hydronephrosis, and acute UTI, and also CAP/ Covid -19 +     Hospital Course: Left nephrolithiasis with left hydronephrosis -patient was initially admitted to [facility], and urology and interventional radiology were consulted due to hydronephrosis.  Initial plans were in place for percutaneous nephrostomy tube, however peri-procedurally IR imaging showed resolution of her hydronephrosis.  Prior to the procedure patient has been starting to pass several stones.  Given spontaneous elimination of stones and resolution of her hydronephrosis percutaneous nephrostomy placement was canceled.  She was empirically placed on Rocephin, urine cultures have not had any growth, received 5 days while hospitalized and will be transitioned to cefpodoxime for 2 additional days on discharge.  Acute Hypoxic Respiratory Failure due to Covid-19 Viral Illness -Patient presented to the hospital with flank pain and urinary symptoms, however shortly after admission she developed shortness of breath and was found to be hypoxic.  Required supplemental oxygen, and upon resolution of #1 she was transferred to [facility] for further treatment.  She received Remdesivir for 5 days along with IV steroids.  She also received a dose of Lasix.  She had resultant hypokalemia which was corrected prior to discharge.  Her respiratory status is improved, she is  able to ambulate in the hallway on room air without significant difficulties and will be discharged home in stable condition.  She was advised to follow-up with PCP as an outpatient within a  week.  Hypokalemia -Repleted  Type 2 diabetes mellitus -Continue home medications, CBGs were elevated due to steroids in the hospital  Morbid obesity -Advised lifestyle modifications   Discharge Diagnoses:  Principal Problem:   COVID-19 virus infection Active Problems:   Nausea & vomiting   Acute flank pain   Flank pain   Nephrolithiasis   Hydronephrosis, left   Acute lower UTI   Tachycardia   CAP (community acquired pneumonia)   Hypokalemia   Abnormal liver function     Discharge Instructions   Allergies as of 07/14/2018      Reactions   Ciprofloxacin Hives      Medication List    STOP taking these medications   ibuprofen 200 MG tablet Commonly known as: ADVIL     TAKE these medications   cefpodoxime 200 MG tablet Commonly known as: VANTIN Take 1 tablet (200 mg total) by mouth 2 (two) times daily.   clonazePAM 0.5 MG tablet Commonly known as: KLONOPIN Take 0.25-0.5 mg by mouth daily as needed for anxiety.   Cryselle-28 0.3-30 MG-MCG tablet Generic drug: norgestrel-ethinyl estradiol Take 1 tablet by mouth daily.   Janumet XR [phone] MG Tb24 Generic drug: SitaGLIPtin-MetFORMIN HCl Take 1 tablet by mouth every evening.   lisinopril-hydrochlorothiazide 20-12.5 MG tablet Commonly known as: ZESTORETIC Take 1 tablet by mouth daily.   meclizine 12.5 MG tablet Commonly known as: ANTIVERT Take 12.5 mg by mouth 3 (three) times daily as needed for dizziness.   metoprolol succinate 50 MG 24 hr tablet Commonly known as: TOPROL-XL Take 50 mg by mouth daily. Take with or immediately following a meal.   omeprazole 20 MG capsule Commonly known as: PRILOSEC Take 20 mg by mouth every morning.   rosuvastatin 10 MG tablet Commonly known as: CRESTOR Take 10 mg by mouth at bedtime.   Vitamin D (Ergocalciferol) 1.25 MG (50000 UT) Caps capsule Commonly known as: DRISDOL Take 50,000 Units by mouth every Saturday.      Follow-up Information    Ceasar Mons, MD In 1 month.   Specialty: Urology Contact information: [address] [place] [address] [phone]           Consultations:  Urology   IR  Procedures/Studies:  Dg Chest Portable 1 View  Result Date: 07/08/2018 CLINICAL DATA:  53 year old female with history of shortness of breath. EXAM: PORTABLE CHEST 1 VIEW COMPARISON:  Chest x-ray 07/08/2018. FINDINGS: Widespread interstitial prominence and patchy ill-defined airspace disease scattered throughout the lungs bilaterally. No definite pleural effusions. Pulmonary vasculature is obscured. Heart size is borderline enlarged. Upper mediastinal contours are within normal limits. IMPRESSION: 1. Unusual appearance in the lungs, which based on comparison with recent CT the abdomen and pelvis is favored to reflect widespread cryptogenic organizing pneumonia, likely sequela of recent atypical infection (including viral pneumonia such as COVID-19). Electronically Signed   By: Vinnie Langton M.D.   On: 07/08/2018 20:49   Ir Abdomen US Limited  Result Date: 07/11/2018 CLINICAL DATA:  Left-sided hydronephrosis secondary to distal ureteral calculi. The patient presents for left percutaneous nephrostomy tube placement. Just prior to the procedure she states that she has passed approximately 3 kidney stones earlier today. EXAM: ULTRASOUND ABDOMEN LIMITED COMPARISON:  CT of the abdomen and pelvis on 07/08/2018 FINDINGS: Ultrasound performed  of the left kidney prior to possible nephrostomy demonstrates a normal kidney without evidence of hydronephrosis. No masses or shadowing intrarenal calculi identified by ultrasound. IMPRESSION: Normal left renal ultrasound demonstrating resolution of hydronephrosis seen by prior CT. The patient passed several kidney stones earlier today. Given resolution of hydronephrosis, percutaneous nephrostomy tube placement was canceled and not performed. Electronically Signed   By: Aletta Edouard  M.D.   On: 07/11/2018 09:56   Ct Renal Stone Study  Result Date: 07/08/2018 CLINICAL DATA:  Urinary frequency, dysuria, lower back pain EXAM: CT ABDOMEN AND PELVIS WITHOUT CONTRAST TECHNIQUE: Multidetector CT imaging of the abdomen and pelvis was performed following the standard protocol without IV contrast. COMPARISON:  03/06/2015 FINDINGS: Lower chest: There is extensive, irregular bibasilar opacity and consolidation in a peribronchovascular pattern. Hepatobiliary: No solid liver abnormality is seen. Hepatic steatosis. Status post cholecystectomy. Pancreas: Unremarkable. No pancreatic ductal dilatation or surrounding inflammatory changes. Spleen: Normal in size without significant abnormality. Adrenals/Urinary Tract: Adrenal glands are unremarkable. There are numerous stacked calculi in the distal left ureter, measuring approximately 4.7 cm in total length, the largest discrete calculus measuring 1.4 cm (series 5, image 65). There is moderate left hydronephrosis and hydroureter. There is an additional nonobstructive calculus in the inferior pole of the left kidney. Bladder is unremarkable. Stomach/Bowel: Stomach is within normal limits. No evidence of bowel wall thickening, distention, or inflammatory changes. Vascular/Lymphatic: No significant vascular findings are present. No enlarged abdominal or pelvic lymph nodes. Reproductive: No mass or other significant abnormality. Other: No abdominal wall hernia or abnormality. No abdominopelvic ascites. Musculoskeletal: No acute or significant osseous findings. IMPRESSION: 1. There are numerous stacked calculi in the distal left ureter, measuring approximately 4.7 cm in total length, the largest discrete calculus measuring 1.4 cm (series 5, image 65). There is moderate left hydronephrosis and hydroureter. There is an additional nonobstructive calculus in the inferior pole of the left kidney. 2. There is extensive, irregular bibasilar opacity and consolidation in a  peribronchovascular pattern, of uncertain etiology, possibly acutely infectious or inflammatory although chronic interstitial lung disease is a differential consideration. Recommend nonemergent ILD protocol CT examination of the chest to further evaluate. 3.  Hepatic steatosis. Electronically Signed   By: Eddie Candle M.D.   On: 07/08/2018 18:00      Subjective: - no chest pain, shortness of breath, no abdominal pain, nausea or vomiting.   Discharge Exam: BP 128/78 (BP Location: Left Arm)    Pulse 80    Temp 98.3 F (36.8 C) (Oral)    Resp (!) 23    Ht 5\' 4"  (1.626 m)    Wt 113.4 kg    SpO2 90%    BMI 42.91 kg/m   General: Pt is alert, awake, not in acute distress Cardiovascular: RRR, S1/S2 +, no rubs, no gallops Respiratory: CTA bilaterally, no wheezing, no rhonchi Abdominal: Soft, NT, ND, bowel sounds + Extremities: no edema, no cyanosis    The results of significant diagnostics from this hospitalization (including imaging, microbiology, ancillary and laboratory) are listed below for reference.     Microbiology: Recent Results (from the past 240 hour(s))  Urine Culture     Status: Abnormal   Collection Time: 07/08/18  5:19 PM   Specimen: Urine, Clean Catch  Result Value Ref Range Status   Specimen Description   Final    URINE, CLEAN CATCH Performed at [facility], [address], [place], [place] [address]    Special Requests   Final    NONE  Performed at [facility], [address], [place], [place] [address]    Culture (A)  Final    30,000 COLONIES/mL MULTIPLE SPECIES PRESENT, SUGGEST RECOLLECTION   Report Status 07/09/2018 FINAL  Final  SARS Coronavirus 2 (CEPHEID- Performed in [place] hospital lab), Hosp Order     Status: Abnormal   Collection Time: 07/08/18  6:21 PM   Specimen: Nasopharyngeal Swab  Result Value Ref Range Status   SARS Coronavirus 2 POSITIVE (A) NEGATIVE Final    Comment: RESULT CALLED TO, READ BACK BY AND VERIFIED WITH: DOSS,M @ 2116 ON 07/08/18  BY JUW (NOTE) If result is NEGATIVE SARS-CoV-2 target nucleic acids are NOT DETECTED. The SARS-CoV-2 RNA is generally detectable in upper and lower  respiratory specimens during the acute phase of infection. The lowest  concentration of SARS-CoV-2 viral copies this assay can detect is 250  copies / mL. A negative result does not preclude SARS-CoV-2 infection  and should not be used as the sole basis for treatment or other  patient management decisions.  A negative result may occur with  improper specimen collection / handling, submission of specimen other  than nasopharyngeal swab, presence of viral mutation(s) within the  areas targeted by this assay, and inadequate number of viral copies  (<250 copies / mL). A negative result must be combined with clinical  observations, patient history, and epidemiological information. If result is POSITIVE SARS-CoV-2 target nucleic acids are DETECTED. The SA RS-CoV-2 RNA is generally detectable in upper and lower  respiratory specimens during the acute phase of infection.  Positive  results are indicative of active infection with SARS-CoV-2.  Clinical  correlation with patient history and other diagnostic information is  necessary to determine patient infection status.  Positive results do  not rule out bacterial infection or co-infection with other viruses. If result is PRESUMPTIVE POSTIVE SARS-CoV-2 nucleic acids MAY BE PRESENT.   A presumptive positive result was obtained on the submitted specimen  and confirmed on repeat testing.  While 2019 novel coronavirus  (SARS-CoV-2) nucleic acids may be present in the submitted sample  additional confirmatory testing may be necessary for epidemiological  and / or clinical management purposes  to differentiate between  SARS-CoV-2 and other Sarbecovirus currently known to infect humans.  If clinically indicated additional testing with an alternate test  methodology [phone]) is advi sed. The SARS-CoV-2  RNA is generally  detectable in upper and lower respiratory specimens during the acute  phase of infection. The expected result is Negative. Fact Sheet for Patients:  StrictlyIdeas.no Fact Sheet for Healthcare Providers: BankingDealers.co.za This test is not yet approved or cleared by the [place] FDA and has been authorized for detection and/or diagnosis of SARS-CoV-2 by FDA under an Emergency Use Authorization (EUA).  This EUA will remain in effect (meaning this test can be used) for the duration of the COVID-19 declaration under Section 564(b)(1) of the Act, 21 U.S.C. section 360bbb-3(b)(1), unless the authorization is terminated or revoked sooner. Performed at [facility], [address], [place], [place] [address]   Surgical PCR screen     Status: Abnormal   Collection Time: 07/09/18 11:33 PM   Specimen: Nasal Mucosa; Nasal Swab  Result Value Ref Range Status   MRSA, PCR NEGATIVE NEGATIVE Final   Staphylococcus aureus POSITIVE (A) NEGATIVE Final    Comment: (NOTE) The Xpert SA Assay (FDA approved for NASAL specimens in patients 64 years of age and older), is one component of a comprehensive surveillance program. It is  not intended to diagnose infection nor to guide or monitor treatment. Performed at [facility], [place] [address], [place], [place] [address]      Labs: BNP (last 3 results) Recent Labs    07/08/18 1616  BNP 97.9   Basic Metabolic Panel: Recent Labs  Lab 07/09/18 1037 07/10/18 0734 07/11/18 0620 07/12/18 0759 07/13/18 0940 07/14/18 0400  NA 139 139 142 140 137 140  K 2.9* 3.3* 3.3* 3.6 2.9* 3.0*  CL 110 113* 115* 113* 108 106  CO2 18* 16* 18* 16* 21* 21*  GLUCOSE 197* 334* 260* 313* 243* 188*  BUN 13 24* 30* 34* 29* 21*  CREATININE 0.81 0.99 1.03* 1.06* 0.98 0.85  CALCIUM 7.5* 7.7* 7.9* 7.7* 7.9* 7.6*  MG 1.7 1.7  --   --   --   --   PHOS 1.6*  --   --   --   --   --     Liver Function Tests: Recent Labs  Lab 07/08/18 1610 07/09/18 1037 07/10/18 0734 07/11/18 0620 07/12/18 0759  AST 46* 31 27 26 29   ALT 59* 43 39 38 39  ALKPHOS 78 63 64 58 58  BILITOT 0.5 0.3 0.3 0.3 0.2*  PROT 8.1 6.8 7.1 6.8 6.3*  ALBUMIN 3.4* 2.8* 2.8* 2.9* 2.7*   No results for input(s): LIPASE, AMYLASE in the last 168 hours. No results for input(s): AMMONIA in the last 168 hours. CBC: Recent Labs  Lab 07/08/18 1610 07/09/18 1037 07/10/18 0734 07/11/18 0620 07/12/18 0759  WBC 9.4 9.0 5.7 10.3 10.4  NEUTROABS 7.5 7.7 4.4 8.2*  --   HGB 12.5 11.4* 11.6* 11.3* 11.0*  HCT 38.7 36.2 37.4 37.9 35.5*  MCV 87.0 91.0 91.7 92.7 89.4  PLT 345 316 346 408* 348   Cardiac Enzymes: Recent Labs  Lab 07/08/18 1616  CKTOTAL 32*  CKMB 3.5   BNP: Invalid input(s): POCBNP CBG: Recent Labs  Lab 07/13/18 0731 07/13/18 1143 07/13/18 1630 07/13/18 2103 07/14/18 0808  GLUCAP 227* 215* 193* 277* 185*   D-Dimer Recent Labs    07/12/18 0759 07/13/18 0940  DDIMER 2.92* 3.14*   Hgb A1c No results for input(s): HGBA1C in the last 72 hours. Lipid Profile No results for input(s): CHOL, HDL, LDLCALC, TRIG, CHOLHDL, LDLDIRECT in the last 72 hours. Thyroid function studies No results for input(s): TSH, T4TOTAL, T3FREE, THYROIDAB in the last 72 hours.  Invalid input(s): FREET3 Anemia work up Recent Labs    07/13/18 0940  FERRITIN 278   Urinalysis    Component Value Date/Time   COLORURINE AMBER (A) 07/08/2018 1611   APPEARANCEUR CLOUDY (A) 07/08/2018 1611   LABSPEC 1.021 07/08/2018 1611   PHURINE 6.0 07/08/2018 1611   GLUCOSEU NEGATIVE 07/08/2018 1611   HGBUR MODERATE (A) 07/08/2018 1611   BILIRUBINUR NEGATIVE 07/08/2018 1611   KETONESUR NEGATIVE 07/08/2018 1611   PROTEINUR 100 (A) 07/08/2018 1611   NITRITE NEGATIVE 07/08/2018 1611   LEUKOCYTESUR TRACE (A) 07/08/2018 1611   Sepsis Labs Invalid input(s): PROCALCITONIN,  WBC,  LACTICIDVEN  FURTHER DISCHARGE  INSTRUCTIONS:   Get Medicines reviewed and adjusted: Please take all your medications with you for your next visit with your Primary MD   Laboratory/radiological data: Please request your Primary MD to go over all hospital tests and procedure/radiological results at the follow up, please ask your Primary MD to get all Hospital records sent to his/her office.   In some cases, they will be blood work, cultures and biopsy results pending  at the time of your discharge. Please request that your primary care M.D. goes through all the records of your hospital data and follows up on these results.   Also Note the following: If you experience worsening of your admission symptoms, develop shortness of breath, life threatening emergency, suicidal or homicidal thoughts you must seek medical attention immediately by calling 911 or calling your MD immediately  if symptoms less severe.   You must read complete instructions/literature along with all the possible adverse reactions/side effects for all the Medicines you take and that have been prescribed to you. Take any new Medicines after you have completely understood and accpet all the possible adverse reactions/side effects.    Do not drive when taking Pain medications or sleeping medications (Benzodaizepines)   Do not take more than prescribed Pain, Sleep and Anxiety Medications. It is not advisable to combine anxiety,sleep and pain medications without talking with your primary care practitioner   Special Instructions: If you have smoked or chewed Tobacco  in the last 2 yrs please stop smoking, stop any regular Alcohol  and or any Recreational drug use.   Wear Seat belts while driving.   Please note: You were cared for by a hospitalist during your hospital stay. Once you are discharged, your primary care physician will handle any further medical issues. Please note that NO REFILLS for any discharge medications will be authorized once you are discharged,  as it is imperative that you return to your primary care physician (or establish a relationship with a primary care physician if you do not have one) for your post hospital discharge needs so that they can reassess your need for medications and monitor your lab values.  Time coordinating discharge: 40 minutes  SIGNED:  Marzetta Board, MD, PhD 07/14/2018, 11:23 AM

## 2018-09-04 ENCOUNTER — Encounter: Payer: Self-pay | Admitting: Internal Medicine

## 2018-10-01 ENCOUNTER — Telehealth: Payer: Self-pay

## 2018-10-01 NOTE — Telephone Encounter (Signed)
The only imaging I have on file is a CT on 07/08/18 and an [place] on 07/10/18. If we do not have records with her referral, we need to go ahead and request the records from [facility] and [facility]. I will not be able to review a CD in office. I will need a print out of the report.

## 2018-10-01 NOTE — Telephone Encounter (Signed)
Pt has an apt on this Friday 10/05/2018. Pt wants to make sure her report was received from [facility] and [facility]. [phone].   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 Pt wants to make sure the pancreatic lesion can be discussed at the appointment this Friday. Pt isn't having any symptoms but noticed the doctor didn't pt that on the referral. Pt will bring CD disk with her.

## 2018-10-02 NOTE — Telephone Encounter (Signed)
WE HAVE HER CT REPORT FOR HER APPOINTMENT

## 2018-10-02 NOTE — Telephone Encounter (Signed)
Spoke with pt. She faxed over report from [facility]. Is there a report from [facility]? Pt wants to make sure those reports are in our office.   [place]- pt is very concerned with pancreatic lesion and wants to make sure she can discuss this with you at her apt. Pt is going to call [facility] to see if she can get the images/ information from the disk.

## 2018-10-02 NOTE — Telephone Encounter (Signed)
Lmom, waiting on a return call from pt.  

## 2018-10-04 NOTE — Patient Instructions (Addendum)
We will request CT and MRI images from [facility].  Once we have images, we will send them over with a referral for endoscopic ultrasound to  [facility] in [place].  They should call you to set up your appointment.  We will plan to see you back after you see [facility]. We will discuss getting you scheduled for your colonoscopy again at that time.  Call if you have any concerns or do not hear from [place] in the next few weeks about your referral.  Aliene Altes, [facility]

## 2018-10-04 NOTE — Progress Notes (Addendum)
Referring Provider: Dr. Sherlean Foot Primary Care Physician:  Margy Clarks, NP Primary Gastroenterologist:  Dr. Gala Romney  Chief Complaint  Patient presents with  . Colonoscopy    never had tcs    HPI:   Leah Farmer is a 53 y.o. female presenting today at the request of Dr. Sherlean Foot for consult colonoscopy.  She was brought into the office due to current medications.  Past medical history significant for diabetes, hypertension, hyperlipidemia, nephrolithiasis, and hepatic steatosis.   Today she states she is having no issues. She is very anxious to discuss a pancreatic lesion that was seen on recent CT scan for follow-up of nephrolithiasis.  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  Admits to occasional nausea. Will take OTC omeprazole for this a couple times a week which helps. No vomiting. Denies any abdominal pain  or yellowing of skin or eyes.  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 Weight loss when she was sick in June with kidney stones and COVID-19, but none since. Appetite is good.   Reports her mom passed away in 03/28/2012 from pancreatic cancer.  Regarding colon cancer screening. She has no significant lower GI symptoms. BMs daily.  Stools are softer since cholecystectomy. Rare constipation. No blood in the stools or black stools. Reports external hemorrhoid. If she has frequent BMs, the hemorrhoid will swell, and she will have a little bright red blood on the toilet tissue.  No family history of colon cancer or colon polyps.   Rare heartburn. No upper abdominal discomfort. No acid reflux. No trouble swallowing.   No fever or chills. No chest pain. Repots heart rate is always up but she is on a prescription for this. Thinks it borderlines 100. States she feels nothing. No shortness of breath or cough.She did have COVID-19 infection in June and states it took her about 6 weeks for her respiratory status to return to normal.     Past Medical History:  Diagnosis Date  . Diabetes mellitus without complication ([place])   . Hypertension   . NAFL (nonalcoholic fatty liver) 63/78/5885   fatty liver seen on CT scan abd/ pelvis  . Nephrolithiasis   . Tachycardia     Past Surgical History:  Procedure Laterality Date  . APPENDECTOMY    . CHOLECYSTECTOMY      Current Outpatient Medications  Medication Sig Dispense Refill  . clonazePAM (KLONOPIN) 0.5 MG tablet Take 0.25-0.5 mg by mouth daily as needed for anxiety.    Marland Kitchen lisinopril-hydrochlorothiazide (ZESTORETIC) 20-12.5 MG tablet Take 1 tablet by mouth daily.    . meclizine (ANTIVERT) 12.5 MG tablet Take 12.5 mg by mouth 3 (three) times daily as needed for dizziness.    . metoprolol succinate (TOPROL-XL) 50 MG 24 hr tablet Take 50 mg by mouth daily. Take with or immediately following a meal.    . norgestrel-ethinyl estradiol (CRYSELLE-28) 0.3-30 MG-MCG tablet Take 1 tablet by mouth daily.    Marland Kitchen omeprazole (PRILOSEC) 20 MG capsule Take 20 mg  by mouth every morning.    . ondansetron (ZOFRAN) 4 MG tablet Take 4 mg by mouth every 8 (eight) hours as needed for nausea or vomiting.    . rosuvastatin (CRESTOR) 10 MG tablet Take 10 mg by mouth at bedtime.    . SitaGLIPtin-MetFORMIN HCl (JANUMET XR) [phone] MG TB24 Take 1 tablet by mouth every evening.    . traMADol (ULTRAM) 50 MG tablet Take 1 tablet by mouth as needed.    . Vitamin D, Ergocalciferol, (DRISDOL) 1.25 MG (50000 UT) CAPS capsule Take 50,000 Units by mouth every  Saturday.     No current facility-administered medications for this visit.     Allergies as of 10/05/2018 - Review Complete 10/05/2018  Allergen Reaction Noted  . Ciprofloxacin Hives 07/08/2018    Family History  Problem Relation Age of Onset  . Hypertension Mother   . Diabetes Mother   . Pancreatic cancer Mother   . Hypertension Father   . Diabetes Father   . Esophageal cancer Maternal Uncle        Passed at 31. Found colon cancer on colonoscopy at that time.   . Colon cancer Neg Hx   . Colon polyps Neg Hx     Social History   Socioeconomic History  . Marital status: Married    Spouse name: Not on file  . Number of children: Not on file  . Years of education: Not on file  . Highest education level: Not on file  Occupational History  . Not on file  Social Needs  . Financial resource strain: Not on file  . Food insecurity    Worry: Not on file    Inability: Not on file  . Transportation needs    Medical: Not on file    Non-medical: Not on file  Tobacco Use  . Smoking status: Never Smoker  . Smokeless tobacco: Never Used  Substance and Sexual Activity  . Alcohol use: Never    Frequency: Never  . Drug use: Never  . Sexual activity: Not on file  Lifestyle  . Physical activity    Days per week: Not on file    Minutes per session: Not on file  . Stress: Not on file  Relationships  . Social Herbalist on phone: Not on file    Gets together: Not on file    Attends religious service: Not on file    Active member of club or organization: Not on file    Attends meetings of clubs or organizations: Not on file    Relationship status: Not on file  . Intimate partner violence    Fear of current or ex partner: Not on file    Emotionally abused: Not on file    Physically abused: Not on file    Forced sexual activity: Not on file  Other Topics Concern  . Not on file  Social History Narrative  . Not on file    Review of Systems: Gen: See HPI CV: See  HPI.  Resp: See HPI  GI: See HPI GU : Denies urinary burning, urinary frequency, urinary hesitancy MS: Chronic back and hip pain secondary to arthritis.  Derm: Denies rash, itching, dry skin Psych: Admits to significant anxiety.  Heme: Denies bruising, bleeding  Physical Exam: BP (!) 155/92   Pulse (!) 136   Temp (!) 97.3 F (36.3 C) (Temporal)   Ht 5' 4" (1.626 m)   Wt 245 lb 6.4 oz (111.3 kg)   BMI  42.12 kg/m  General:   Alert and oriented. Tearful at times. Well-nourished and well-developed.  Head:  Normocephalic and atraumatic. Eyes:  Without icterus, sclera clear and conjunctiva pink.  Ears:  Normal auditory acuity. Nose:  No deformity, discharge,  or lesions. Lungs:  Clear to auscultation bilaterally. No wheezes, rales, or rhonchi. No distress.  Heart:  Tachycardic. S1, S2 present without murmurs appreciated.  Abdomen:  +BS, soft, non-tender and non-distended. No HSM noted. No guarding or rebound. No masses appreciated.  Rectal:  Deferred  Msk:  Symmetrical without gross deformities. Normal posture. Extremities:  Without clubbing or edema. Neurologic:  Alert and  oriented x4;  grossly normal neurologically. Skin:  Intact without significant lesions or rashes. Psych:  Normal mood and affect.  Labs on 08/02/18: CMP (14): Glucose 161, creatinine 0.93, sodium 139, potassium 4.8, chloride 103, calcium 9.4, total protein 7.3, albumin 4.3, total bilirubin 0.3, alk phos 85, AST 25, ALT 28. CBC: WBC 9.7, hemoglobin 13.0, hematocrit 41.7, MCV 93, MCH 28.9, MCHC 31.2, RDW 14.5, platelets 443.

## 2018-10-04 NOTE — H&P (View-Only) (Signed)
Referring Provider: Dr. Sherlean Foot Primary Care Physician:  Margy Clarks, NP Primary Gastroenterologist:  Dr. Gala Romney  Chief Complaint  Patient presents with  . Colonoscopy    never had tcs    HPI:   Leah Farmer is a 53 y.o. female presenting today at the request of Dr. Sherlean Foot for consult colonoscopy.  She was brought into the office due to current medications.  Past medical history significant for diabetes, hypertension, hyperlipidemia, nephrolithiasis, and hepatic steatosis.   Today she states she is having no issues. She is very anxious to discuss a pancreatic lesion that was seen on recent CT scan for follow-up of nephrolithiasis.  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  Admits to occasional nausea. Will take OTC omeprazole for this a couple times a week which helps. No vomiting. Denies any abdominal pain  or yellowing of skin or eyes.  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 Weight loss when she was sick in June with kidney stones and COVID-19, but none since. Appetite is good.   Reports her mom passed away in 13-Mar-2012 from pancreatic cancer.  Regarding colon cancer screening. She has no significant lower GI symptoms. BMs daily.  Stools are softer since cholecystectomy. Rare constipation. No blood in the stools or black stools. Reports external hemorrhoid. If she has frequent BMs, the hemorrhoid will swell, and she will have a little bright red blood on the toilet tissue.  No family history of colon cancer or colon polyps.   Rare heartburn. No upper abdominal discomfort. No acid reflux. No trouble swallowing.   No fever or chills. No chest pain. Repots heart rate is always up but she is on a prescription for this. Thinks it borderlines 100. States she feels nothing. No shortness of breath or cough.She did have COVID-19 infection in June and states it took her about 6 weeks for her respiratory status to return to normal.     Past Medical History:  Diagnosis Date  . Diabetes mellitus without complication ([place])   . Hypertension   . NAFL (nonalcoholic fatty liver) 63/78/5885   fatty liver seen on CT scan abd/ pelvis  . Nephrolithiasis   . Tachycardia     Past Surgical History:  Procedure Laterality Date  . APPENDECTOMY    . CHOLECYSTECTOMY      Current Outpatient Medications  Medication Sig Dispense Refill  . clonazePAM (KLONOPIN) 0.5 MG tablet Take 0.25-0.5 mg by mouth daily as needed for anxiety.    Marland Kitchen lisinopril-hydrochlorothiazide (ZESTORETIC) 20-12.5 MG tablet Take 1 tablet by mouth daily.    . meclizine (ANTIVERT) 12.5 MG tablet Take 12.5 mg by mouth 3 (three) times daily as needed for dizziness.    . metoprolol succinate (TOPROL-XL) 50 MG 24 hr tablet Take 50 mg by mouth daily. Take with or immediately following a meal.    . norgestrel-ethinyl estradiol (CRYSELLE-28) 0.3-30 MG-MCG tablet Take 1 tablet by mouth daily.    Marland Kitchen omeprazole (PRILOSEC) 20 MG capsule Take 20 mg  by mouth every morning.    . ondansetron (ZOFRAN) 4 MG tablet Take 4 mg by mouth every 8 (eight) hours as needed for nausea or vomiting.    . rosuvastatin (CRESTOR) 10 MG tablet Take 10 mg by mouth at bedtime.    . SitaGLIPtin-MetFORMIN HCl (JANUMET XR) 100-1000 MG TB24 Take 1 tablet by mouth every evening.    . traMADol (ULTRAM) 50 MG tablet Take 1 tablet by mouth as needed.    . Vitamin D, Ergocalciferol, (DRISDOL) 1.25 MG (50000 UT) CAPS capsule Take 50,000 Units by mouth every  Saturday.     No current facility-administered medications for this visit.     Allergies as of 10/05/2018 - Review Complete 10/05/2018  Allergen Reaction Noted  . Ciprofloxacin Hives 07/08/2018    Family History  Problem Relation Age of Onset  . Hypertension Mother   . Diabetes Mother   . Pancreatic cancer Mother   . Hypertension Father   . Diabetes Father   . Esophageal cancer Maternal Uncle        Passed at 69. Found colon cancer on colonoscopy at that time.   . Colon cancer Neg Hx   . Colon polyps Neg Hx     Social History   Socioeconomic History  . Marital status: Married    Spouse name: Not on file  . Number of children: Not on file  . Years of education: Not on file  . Highest education level: Not on file  Occupational History  . Not on file  Social Needs  . Financial resource strain: Not on file  . Food insecurity    Worry: Not on file    Inability: Not on file  . Transportation needs    Medical: Not on file    Non-medical: Not on file  Tobacco Use  . Smoking status: Never Smoker  . Smokeless tobacco: Never Used  Substance and Sexual Activity  . Alcohol use: Never    Frequency: Never  . Drug use: Never  . Sexual activity: Not on file  Lifestyle  . Physical activity    Days per week: Not on file    Minutes per session: Not on file  . Stress: Not on file  Relationships  . Social connections    Talks on phone: Not on file    Gets together: Not on file    Attends religious service: Not on file    Active member of club or organization: Not on file    Attends meetings of clubs or organizations: Not on file    Relationship status: Not on file  . Intimate partner violence    Fear of current or ex partner: Not on file    Emotionally abused: Not on file    Physically abused: Not on file    Forced sexual activity: Not on file  Other Topics Concern  . Not on file  Social History Narrative  . Not on file    Review of Systems: Gen: See HPI CV: See  HPI.  Resp: See HPI  GI: See HPI GU : Denies urinary burning, urinary frequency, urinary hesitancy MS: Chronic back and hip pain secondary to arthritis.  Derm: Denies rash, itching, dry skin Psych: Admits to significant anxiety.  Heme: Denies bruising, bleeding  Physical Exam: BP (!) 155/92   Pulse (!) 136   Temp (!) 97.3 F (36.3 C) (Temporal)   Ht 5' 4" (1.626 m)   Wt 245 lb 6.4 oz (111.3 kg)   BMI   42.12 kg/m  General:   Alert and oriented. Tearful at times. Well-nourished and well-developed.  Head:  Normocephalic and atraumatic. Eyes:  Without icterus, sclera clear and conjunctiva pink.  Ears:  Normal auditory acuity. Nose:  No deformity, discharge,  or lesions. Lungs:  Clear to auscultation bilaterally. No wheezes, rales, or rhonchi. No distress.  Heart:  Tachycardic. S1, S2 present without murmurs appreciated.  Abdomen:  +BS, soft, non-tender and non-distended. No HSM noted. No guarding or rebound. No masses appreciated.  Rectal:  Deferred  Msk:  Symmetrical without gross deformities. Normal posture. Extremities:  Without clubbing or edema. Neurologic:  Alert and  oriented x4;  grossly normal neurologically. Skin:  Intact without significant lesions or rashes. Psych:  Normal mood and affect.  Labs on 08/02/18: CMP (14): Glucose 161, creatinine 0.93, sodium 139, potassium 4.8, chloride 103, calcium 9.4, total protein 7.3, albumin 4.3, total bilirubin 0.3, alk phos 85, AST 25, ALT 28. CBC: WBC 9.7, hemoglobin 13.0, hematocrit 41.7, MCV 93, MCH 28.9, MCHC 31.2, RDW 14.5, platelets 443.

## 2018-10-05 ENCOUNTER — Other Ambulatory Visit: Payer: Self-pay

## 2018-10-05 ENCOUNTER — Encounter: Payer: Self-pay | Admitting: Gastroenterology

## 2018-10-05 ENCOUNTER — Ambulatory Visit: Payer: BC Managed Care – PPO | Admitting: Gastroenterology

## 2018-10-05 DIAGNOSIS — K869 Disease of pancreas, unspecified: Secondary | ICD-10-CM

## 2018-10-05 DIAGNOSIS — Z1211 Encounter for screening for malignant neoplasm of colon: Secondary | ICD-10-CM | POA: Diagnosis not present

## 2018-10-07 ENCOUNTER — Encounter: Payer: Self-pay | Admitting: Gastroenterology

## 2018-10-07 DIAGNOSIS — D490 Neoplasm of unspecified behavior of digestive system: Secondary | ICD-10-CM | POA: Insufficient documentation

## 2018-10-07 DIAGNOSIS — K869 Disease of pancreas, unspecified: Secondary | ICD-10-CM | POA: Insufficient documentation

## 2018-10-07 DIAGNOSIS — Z1211 Encounter for screening for malignant neoplasm of colon: Secondary | ICD-10-CM | POA: Insufficient documentation

## 2018-10-07 NOTE — Assessment & Plan Note (Addendum)
53 year old female with past medical history significant for diabetes, hypertension, hyperlipidemia, nephrolithiasis, and hepatic steatosis.  Also with pancreatic lesion needing further evaluation. She presents today to discuss scheduling her first ever colonoscopy. No significant lower GI symptoms. Denies abdominal pain.  Bowel movements daily.  She does report an external hemorrhoid that will swell if she has multiple bowel movements.  This is associated with bright red blood on toilet tissue only.  No other rectal bleeding or melena.  She did lose weight after acute illness with nephrolithiasis and COVID-19 in June 2020 but weight has been stable since.  Appetite is good.  No family history of colon cancer or colon polyps.  Recent CBC on 08/02/2018 with normal hemoglobin.  Patient wishes to postpone scheduling her colonoscopy as she wants to focus on the evaluation of her pancreatic lesion at this time.  I feel that this is reasonable as patient is without any significant alarm symptoms. Will discuss scheduling in the future.

## 2018-10-07 NOTE — Assessment & Plan Note (Addendum)
53 year old female with pancreatic mass apparently stable since 2017 without further evaluation until recently when a CT abdomen and pelvis without contrast was performed at [facility] for follow-up on nephrolithiasis. 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  Follow-up MRI at [facility] on 09/05/2018 with enhancing 2.3 cm mass in the uncinate process of the pancreas.  Interestingly, CT abdomen and pelvis with and without contrast in February 2017 and CT renal study in June 2020 in our system identifies no pancreatic lesion. Patients only symptoms at this time are intermittent nausea without vomiting and fatigue which she correlates with her chronic back and hip pain. No abdominal pain or jaundice.  Family history significant for mother passing away from pancreatic cancer in 2014.  Will plan to refer patient for EUS for further evaluation in [facility] with Dr. Ardis Hughs at [place] as soon as possible. First, images need to be obtained from from outside CT and MRI for Dr. Ardis Hughs to be able review.

## 2018-10-19 ENCOUNTER — Other Ambulatory Visit: Payer: Self-pay

## 2018-10-19 ENCOUNTER — Telehealth: Payer: Self-pay

## 2018-10-19 DIAGNOSIS — K869 Disease of pancreas, unspecified: Secondary | ICD-10-CM

## 2018-10-19 NOTE — Telephone Encounter (Signed)
The pt has also been advised to have the CT/MRI reports/disc sent to our office for review

## 2018-10-19 NOTE — Telephone Encounter (Signed)
-----   Message from Milus Banister, MD sent at 10/19/2018  6:35 AM EDT ----- Regarding: RE: Pancreatic Mass; Possible EUS Kristen, I think upper EUS evaluation with FNA is the best next step here.  We'll reach out to her about scheduling. Would be great it you could send the CT/MR reports and disc our way as well.  I'll have Jacorian Golaszewski coordinate.  Thanks  Elfida Shimada, See above.  She needs upper EUS, first available with myself or Gabe.  For pancreatic mass.  Can you help coordinate getting the Ct/MR reports and disc sent here as well.  Wynetta Fines  THanks ALL. ----- Message ----- From: Roselyn Reef Sent: 10/18/2018   7:47 AM EDT To: Milus Banister, MD, Erenest Rasher, PA-C Subject: Pancreatic Mass; Possible EUS                  Dr. 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 MRI report impression states, "Differential considerations include benign versus indolent neoplasia as stated in the prior CT a neuroendocrine tumor. Further evaluation with oncology consultation and pancreatic tumor markers recommended." Prior to sending to oncology, I feel patient would likely benefit from EUS for further evaluation. I have the print out reports from the CT and MRI as well as the MRI disc.   What are your thoughts? I could have the reports and disc sent to your for review.   Thanks,   Aliene Altes, [facility]

## 2018-10-19 NOTE — Telephone Encounter (Signed)
EUS scheduled for 10/1, pt instructed and medications reviewed.  Patient instructions mailed to home.  Patient to call with any questions or concerns. COVID testing given to the pt as well.

## 2018-10-29 ENCOUNTER — Other Ambulatory Visit (HOSPITAL_COMMUNITY)
Admission: RE | Admit: 2018-10-29 | Discharge: 2018-10-29 | Disposition: A | Discharge status: Home / Self Care | Payer: BC Managed Care – PPO | Source: Ambulatory Visit | Attending: Gastroenterology | Admitting: Gastroenterology

## 2018-10-29 ENCOUNTER — Other Ambulatory Visit: Payer: Self-pay

## 2018-10-29 ENCOUNTER — Encounter (HOSPITAL_COMMUNITY): Payer: Self-pay | Admitting: Emergency Medicine

## 2018-10-29 DIAGNOSIS — Z20828 Contact with and (suspected) exposure to other viral communicable diseases: Secondary | ICD-10-CM | POA: Diagnosis not present

## 2018-10-29 DIAGNOSIS — Z01812 Encounter for preprocedural laboratory examination: Secondary | ICD-10-CM | POA: Insufficient documentation

## 2018-10-29 DIAGNOSIS — K869 Disease of pancreas, unspecified: Secondary | ICD-10-CM | POA: Insufficient documentation

## 2018-10-29 NOTE — Progress Notes (Signed)
Pre-op endo call completed.  Patient with hx of tachycardia. However patient reports no associated cardiac sx.  APP made aware.

## 2018-10-30 LAB — NOVEL CORONAVIRUS, NAA (HOSP ORDER, SEND-OUT TO REF LAB; TAT 18-24 HRS): SARS-CoV-2, NAA: NOT DETECTED

## 2018-10-31 NOTE — Progress Notes (Signed)
Spoke with patient, has quarantined since covid 19 testing without fever/symptoms.  Answered questions regarding procedure.  Arrival time at 0900

## 2018-11-01 ENCOUNTER — Ambulatory Visit (HOSPITAL_COMMUNITY): Payer: BC Managed Care – PPO

## 2018-11-01 ENCOUNTER — Encounter (HOSPITAL_COMMUNITY)
Admission: RE | Disposition: A | Discharge status: Home / Self Care | Payer: Self-pay | Source: Home / Self Care | Attending: Gastroenterology

## 2018-11-01 ENCOUNTER — Ambulatory Visit (HOSPITAL_COMMUNITY)
Admission: RE | Admit: 2018-11-01 | Discharge: 2018-11-01 | Disposition: A | Discharge status: Home / Self Care | Payer: BC Managed Care – PPO | Attending: Gastroenterology | Admitting: Gastroenterology

## 2018-11-01 ENCOUNTER — Other Ambulatory Visit: Payer: Self-pay

## 2018-11-01 ENCOUNTER — Encounter (HOSPITAL_COMMUNITY): Payer: Self-pay

## 2018-11-01 DIAGNOSIS — E119 Type 2 diabetes mellitus without complications: Secondary | ICD-10-CM | POA: Diagnosis not present

## 2018-11-01 DIAGNOSIS — R000 Tachycardia, unspecified: Secondary | ICD-10-CM | POA: Insufficient documentation

## 2018-11-01 DIAGNOSIS — K869 Disease of pancreas, unspecified: Secondary | ICD-10-CM

## 2018-11-01 DIAGNOSIS — Z881 Allergy status to other antibiotic agents status: Secondary | ICD-10-CM | POA: Insufficient documentation

## 2018-11-01 DIAGNOSIS — Z87442 Personal history of urinary calculi: Secondary | ICD-10-CM | POA: Diagnosis not present

## 2018-11-01 DIAGNOSIS — K760 Fatty (change of) liver, not elsewhere classified: Secondary | ICD-10-CM | POA: Diagnosis not present

## 2018-11-01 DIAGNOSIS — Z833 Family history of diabetes mellitus: Secondary | ICD-10-CM | POA: Insufficient documentation

## 2018-11-01 DIAGNOSIS — E785 Hyperlipidemia, unspecified: Secondary | ICD-10-CM | POA: Insufficient documentation

## 2018-11-01 DIAGNOSIS — Z9049 Acquired absence of other specified parts of digestive tract: Secondary | ICD-10-CM | POA: Insufficient documentation

## 2018-11-01 DIAGNOSIS — Z79899 Other long term (current) drug therapy: Secondary | ICD-10-CM | POA: Diagnosis not present

## 2018-11-01 DIAGNOSIS — Z7984 Long term (current) use of oral hypoglycemic drugs: Secondary | ICD-10-CM | POA: Diagnosis not present

## 2018-11-01 DIAGNOSIS — I10 Essential (primary) hypertension: Secondary | ICD-10-CM | POA: Diagnosis not present

## 2018-11-01 DIAGNOSIS — D490 Neoplasm of unspecified behavior of digestive system: Secondary | ICD-10-CM | POA: Diagnosis not present

## 2018-11-01 DIAGNOSIS — Z6841 Body Mass Index (BMI) 40.0 and over, adult: Secondary | ICD-10-CM | POA: Insufficient documentation

## 2018-11-01 DIAGNOSIS — K8689 Other specified diseases of pancreas: Secondary | ICD-10-CM

## 2018-11-01 DIAGNOSIS — Z800 Family history of malignant neoplasm of digestive organs: Secondary | ICD-10-CM | POA: Insufficient documentation

## 2018-11-01 DIAGNOSIS — E669 Obesity, unspecified: Secondary | ICD-10-CM | POA: Insufficient documentation

## 2018-11-01 DIAGNOSIS — Z8249 Family history of ischemic heart disease and other diseases of the circulatory system: Secondary | ICD-10-CM | POA: Insufficient documentation

## 2018-11-01 HISTORY — DX: Other specified postprocedural states: R11.2

## 2018-11-01 HISTORY — PX: FINE NEEDLE ASPIRATION: SHX5430

## 2018-11-01 HISTORY — DX: Nausea with vomiting, unspecified: R11.2

## 2018-11-01 HISTORY — PX: ESOPHAGOGASTRODUODENOSCOPY (EGD) WITH PROPOFOL: SHX5813

## 2018-11-01 HISTORY — DX: Other specified postprocedural states: Z98.890

## 2018-11-01 HISTORY — PX: EUS: SHX5427

## 2018-11-01 LAB — GLUCOSE, CAPILLARY: Glucose-Capillary: 144 mg/dL — ABNORMAL HIGH (ref 70–99)

## 2018-11-01 SURGERY — UPPER ENDOSCOPIC ULTRASOUND (EUS) LINEAR
Anesthesia: Monitor Anesthesia Care

## 2018-11-01 MED ORDER — PROPOFOL 10 MG/ML IV BOLUS
INTRAVENOUS | Status: DC | PRN
  Administered 2018-11-01: 20 mg via INTRAVENOUS
  Administered 2018-11-01: 10 mg via INTRAVENOUS
  Administered 2018-11-01 (×2): 20 mg via INTRAVENOUS
  Administered 2018-11-01 (×6): 10 mg via INTRAVENOUS
  Administered 2018-11-01: 20 mg via INTRAVENOUS

## 2018-11-01 MED ORDER — LIDOCAINE 2% (20 MG/ML) 5 ML SYRINGE
INTRAMUSCULAR | Status: DC | PRN
  Administered 2018-11-01: 100 mg via INTRAVENOUS

## 2018-11-01 MED ORDER — PROPOFOL 10 MG/ML IV BOLUS
INTRAVENOUS | Status: AC
  Filled 2018-11-01: qty 20

## 2018-11-01 MED ORDER — PROPOFOL 500 MG/50ML IV EMUL
INTRAVENOUS | Status: DC | PRN
  Administered 2018-11-01: 120 ug/kg/min via INTRAVENOUS

## 2018-11-01 MED ORDER — LACTATED RINGERS IV SOLN
INTRAVENOUS | Status: DC
  Administered 2018-11-01: 11:00:00 via INTRAVENOUS

## 2018-11-01 MED ORDER — SODIUM CHLORIDE 0.9 % IV SOLN: INTRAVENOUS | Status: DC

## 2018-11-01 MED ORDER — SODIUM CHLORIDE 0.9 % IV SOLN
INTRAVENOUS | Status: DC
  Administered 2018-11-01: 10:00:00 via INTRAVENOUS

## 2018-11-01 NOTE — Discharge Instructions (Signed)
YOU HAD AN ENDOSCOPIC PROCEDURE TODAY: Refer to the procedure report and other information in the discharge instructions given to you for any specific questions about what was found during the examination. If this information does not answer your questions, please call [place] office at [phone] to clarify.   YOU SHOULD EXPECT: Some feelings of bloating in the abdomen. Passage of more gas than usual. Walking can help get rid of the air that was put into your GI tract during the procedure and reduce the bloating. If you had a lower endoscopy (such as a colonoscopy or flexible sigmoidoscopy) you may notice spotting of blood in your stool or on the toilet paper. Some abdominal soreness may be present for a day or two, also.  DIET: Your first meal following the procedure should be a light meal and then it is ok to progress to your normal diet. A half-sandwich or bowl of soup is an example of a good first meal. Heavy or fried foods are harder to digest and may make you feel nauseous or bloated. Drink plenty of fluids but you should avoid alcoholic beverages for 24 hours. If you had a esophageal dilation, please see attached instructions for diet.    ACTIVITY: Your care partner should take you home directly after the procedure. You should plan to take it easy, moving slowly for the rest of the day. You can resume normal activity the day after the procedure however YOU SHOULD NOT DRIVE, use power tools, machinery or perform tasks that involve climbing or major physical exertion for 24 hours (because of the sedation medicines used during the test).   SYMPTOMS TO REPORT IMMEDIATELY: A gastroenterologist can be reached at any hour. Please call [phone]  for any of the following symptoms:   Following upper endoscopy (EGD, EUS, ERCP, esophageal dilation) Vomiting of blood or coffee ground material  New, significant abdominal pain  New, significant chest pain or pain under the shoulder blades  Painful or  persistently difficult swallowing  New shortness of breath  Black, tarry-looking or red, bloody stools  FOLLOW UP:  If any biopsies were taken you will be contacted by phone or by letter within the next 1-3 weeks. Call [phone]  if you have not heard about the biopsies in 3 weeks.  Please also call with any specific questions about appointments or follow up tests.  

## 2018-11-01 NOTE — Transfer of Care (Signed)
Immediate Anesthesia Transfer of Care Note  Patient: Leah Farmer  Procedure(s) Performed: UPPER ENDOSCOPIC ULTRASOUND (EUS) LINEAR (N/A ) FINE NEEDLE ASPIRATION (FNA) LINEAR (N/A ) ESOPHAGOGASTRODUODENOSCOPY (EGD) WITH PROPOFOL (N/A )  Patient Location: PACU  Anesthesia Type:MAC  Level of Consciousness: awake, alert  and patient cooperative  Airway & Oxygen Therapy: Patient Spontanous Breathing and Patient connected to face mask oxygen  Post-op Assessment: Report given to RN, Post -op Vital signs reviewed and stable and Patient moving all extremities  Post vital signs: Reviewed and stable  Last Vitals:  Vitals Value Taken Time  BP    Temp    Pulse    Resp    SpO2      Last Pain:  Vitals:   11/01/18 0918  TempSrc: Oral  PainSc: 0-No pain         Complications: No apparent anesthesia complications

## 2018-11-01 NOTE — Anesthesia Preprocedure Evaluation (Signed)
Anesthesia Evaluation  Patient identified by MRN, date of birth, ID band Patient awake    Reviewed: Allergy & Precautions, NPO status , Patient's Chart, lab work & pertinent test results  Airway Mallampati: II  TM Distance: >3 FB Neck ROM: Full    Dental  (+) Teeth Intact   Pulmonary    breath sounds clear to auscultation       Cardiovascular hypertension,  Rhythm:Regular Rate:Normal     Neuro/Psych    GI/Hepatic   Endo/Other  diabetes  Renal/GU      Musculoskeletal   Abdominal (+) + obese,   Peds  Hematology   Anesthesia Other Findings   Reproductive/Obstetrics                             Anesthesia Physical Anesthesia Plan  ASA: III  Anesthesia Plan: MAC   Post-op Pain Management:    Induction: Intravenous  PONV Risk Score and Plan: Ondansetron and Propofol infusion  Airway Management Planned: Natural Airway and Nasal Cannula  Additional Equipment:   Intra-op Plan:   Post-operative Plan:   Informed Consent: I have reviewed the patients History and Physical, chart, labs and discussed the procedure including the risks, benefits and alternatives for the proposed anesthesia with the patient or authorized representative who has indicated his/her understanding and acceptance.       Plan Discussed with: CRNA and Anesthesiologist  Anesthesia Plan Comments:         Anesthesia Quick Evaluation

## 2018-11-01 NOTE — Op Note (Signed)
[facility] Patient Name: Leah Farmer Procedure Date: 11/01/2018 MRN: [id number] Attending MD: Milus Banister , MD Date of Birth: [birth year] CSN: [id number] Age: 53 Admit Type: Outpatient Procedure:                Upper EUS Indications:              incidental mass in pancreas on recent CT ([facility]                            [facility]) and MRI [facility]) that has probably not                            changed in about 3 years, mother died of pancreatic                            cancer, no weight loss, no abd pains. Providers:                Milus Banister, MD, Josie Dixon, RN, Carlyn Reichert, RN, Elspeth Cho Tech., Technician, Orma Flaming, CRNA Referring MD:             Milton Ferguson MD, Aliene Altes, [place] Medicines:                Monitored Anesthesia Care Complications:            No immediate complications. Estimated blood loss:                            None. Estimated Blood Loss:     Estimated blood loss: none. Procedure:                Pre-Anesthesia Assessment:                           - Prior to the procedure, a History and Physical                            was performed, and patient medications and                            allergies were reviewed. The patient's tolerance of                            previous anesthesia was also reviewed. The risks                            and benefits of the procedure and the sedation                            options and risks were discussed with the patient.  All questions were answered, and informed consent                            was obtained. Prior Anticoagulants: The patient has                            taken no previous anticoagulant or antiplatelet                            agents. ASA Grade Assessment: III - A patient with                            severe systemic disease. After reviewing the risks                             and benefits, the patient was deemed in                            satisfactory condition to undergo the procedure.                           After obtaining informed consent, the endoscope was                            passed under direct vision. Throughout the                            procedure, the patient's blood pressure, pulse, and                            oxygen saturations were monitored continuously. The                            [id number]) Olympus Radial EUS was                            introduced through the mouth, and advanced to the                            second part of duodenum. The [id number]                            Olympus Linear EUS was introduced through the                            mouth, and advanced to the second part of duodenum.                            The patient tolerated the procedure well. Scope In: Scope Out: Findings:      ENDOSCOPIC FINDING (limited views with radial and linear       echoendoscopes): :      The examined esophagus was endoscopically normal.      The entire examined stomach was endoscopically normal.  The examined duodenum was endoscopically normal.      ENDOSONOGRAPHIC FINDING: :      1. An oval mass was identified in the uncinate process of the pancreas.       The mass was hypoechoic and heterogenous. The mass measured 26 mm by 24       mm in maximal cross-sectional diameter. The endosonographic borders were       well-defined and there was clearly no involvement of any major vascular       structures (SMA, celiac trunk, SMA, PV). The remaining pancreatic       parenchyma was normal. The main pancreatic duct was normal. Fine needle       aspiration for cytology was performed. Color Doppler imaging was       utilized prior to needle puncture to confirm a lack of significant       vascular structures within the needle path. Three passes were made with       the 25 gauge needle  using a transduodenal approach. A cytotechnologist       was present to evaluate the adequacy of the specimen. Final cytology       results are pending.      2. Normal, non-dilated CBD.      3. No peripancreatic adenopathy.      4. Gallbladder surgically absent.      5. Limited views of the liver, spleen, portal and splenic vessels were       all normal. Impression:               - Incidental 2.6cm well demarcated solid mass in                            the uncinate pancreas that has been present since                            at least 2017. The mass does not involve any                            significant vascular structures and is not causing                            main pancreatic duct or bile duct obstruction.                            Preliminary cytology review suggests neuroendocine                            tumor such as an Islet Cell, PNET. Await final                            cytology results. Moderate Sedation:      Not Applicable - Patient had care per Anesthesia. Recommendation:           - Discharge patient to home (ambulatory).                           - Await cytology results. Procedure Code(s):        --- Professional ---  43238, Esophagogastroduodenoscopy, flexible,                            transoral; with transendoscopic ultrasound-guided                            intramural or transmural fine needle                            aspiration/biopsy(s), (includes endoscopic                            ultrasound examination limited to the esophagus,                            stomach or duodenum, and adjacent structures) Diagnosis Code(s):        --- Professional ---                           K86.89, Other specified diseases of pancreas                           R93.2, Abnormal findings on diagnostic imaging of                            liver and biliary tract CPT copyright 2019 American Medical Association. All rights  reserved. The codes documented in this report are preliminary and upon coder review may  be revised to meet current compliance requirements. Milus Banister, MD 11/01/2018 11:33:06 AM This report has been signed electronically. Number of Addenda: 0

## 2018-11-01 NOTE — Interval H&P Note (Signed)
History and Physical Interval Note:  11/01/2018 9:54 AM  Leah Farmer  has presented today for surgery, with the diagnosis of pancreatic lesion.  The various methods of treatment have been discussed with the patient and family. After consideration of risks, benefits and other options for treatment, the patient has consented to  Procedure(s): UPPER ENDOSCOPIC ULTRASOUND (EUS) LINEAR (N/A) as a surgical intervention.  The patient's history has been reviewed, patient examined, no change in status, stable for surgery.  I have reviewed the patient's chart and labs.  Questions were answered to the patient's satisfaction.     Milus Banister

## 2018-11-01 NOTE — Anesthesia Procedure Notes (Signed)
Date/Time: 11/01/2018 10:36 AM Performed by: Niel Hummer, CRNA Oxygen Delivery Method: Simple face mask

## 2018-11-01 NOTE — Anesthesia Postprocedure Evaluation (Signed)
Anesthesia Post Note  Patient: Leah Farmer  Procedure(s) Performed: UPPER ENDOSCOPIC ULTRASOUND (EUS) LINEAR (N/A ) FINE NEEDLE ASPIRATION (FNA) LINEAR (N/A ) ESOPHAGOGASTRODUODENOSCOPY (EGD) WITH PROPOFOL (N/A )     Patient location during evaluation: Endoscopy Anesthesia Type: MAC Level of consciousness: awake and alert Pain management: pain level controlled Vital Signs Assessment: post-procedure vital signs reviewed and stable Respiratory status: spontaneous breathing, nonlabored ventilation, respiratory function stable and patient connected to nasal cannula oxygen Cardiovascular status: stable and blood pressure returned to baseline Postop Assessment: no apparent nausea or vomiting Anesthetic complications: no    Last Vitals:  Vitals:   11/01/18 1135 11/01/18 1149  BP: 111/64 121/63  Pulse:  (!) 101  Resp: 20   Temp: 37 C   SpO2: 100% 96%    Last Pain:  Vitals:   11/01/18 1149  TempSrc:   PainSc: 0-No pain                 Yelitza Reach COKER

## 2018-11-02 ENCOUNTER — Encounter (HOSPITAL_COMMUNITY): Payer: Self-pay | Admitting: Gastroenterology

## 2018-11-02 LAB — CYTOLOGY - NON PAP

## 2018-11-05 ENCOUNTER — Telehealth: Payer: Self-pay | Admitting: Gastroenterology

## 2018-11-05 NOTE — Telephone Encounter (Signed)
Hi Dr. Ardis Hughs, this pt just returned your call. She is requesting another call if possible. Thank you.

## 2018-11-05 NOTE — Telephone Encounter (Signed)
Thanks I just spoke with her.  See results note from recent biopsy.

## 2018-12-10 ENCOUNTER — Other Ambulatory Visit (HOSPITAL_COMMUNITY): Payer: Self-pay | Admitting: General Surgery

## 2018-12-10 ENCOUNTER — Other Ambulatory Visit: Payer: Self-pay | Admitting: General Surgery

## 2018-12-12 ENCOUNTER — Other Ambulatory Visit (HOSPITAL_COMMUNITY): Payer: Self-pay | Admitting: General Surgery

## 2018-12-12 DIAGNOSIS — C7A8 Other malignant neuroendocrine tumors: Secondary | ICD-10-CM

## 2018-12-14 ENCOUNTER — Other Ambulatory Visit: Payer: Self-pay | Admitting: General Surgery

## 2019-01-09 ENCOUNTER — Other Ambulatory Visit (HOSPITAL_COMMUNITY): Payer: BC Managed Care – PPO

## 2019-01-09 ENCOUNTER — Other Ambulatory Visit: Payer: Self-pay

## 2019-01-09 ENCOUNTER — Encounter (HOSPITAL_COMMUNITY)
Admission: RE | Admit: 2019-01-09 | Discharge: 2019-01-09 | Disposition: A | Discharge status: Home / Self Care | Payer: BC Managed Care – PPO | Source: Ambulatory Visit | Attending: General Surgery | Admitting: General Surgery

## 2019-01-09 DIAGNOSIS — C7A8 Other malignant neuroendocrine tumors: Secondary | ICD-10-CM

## 2019-01-09 MED ORDER — GALLIUM GA 68 DOTATATE IV KIT
4.9000 | PACK | Freq: Once | INTRAVENOUS | Status: AC | PRN
  Administered 2019-01-09: 4.9 via INTRAVENOUS

## 2019-01-09 NOTE — H&P (Signed)
Leah Farmer Location: [facility] Patient #: [id number] DOB: 10/31/1965 Married / Language: English / Race: White Female   History of Present Illness The patient is a 53 year old female who presents with a pancreatic mass. Pt is a 53 yo F referred by Dr. 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 The patient came to medical attention this summer in June when she was found to have lots of obstructing kidney stones and flank pain. She was also found to have COVID 19. The tumor was not called on the renal protocol scan. The patient had non op therapy and passed the stones. She went for follow up at [facility] urology and had repeat CT. She was told that her pancreatic tumor was unchanged since 2017! She had not been aware of this. Her mother died of pancreatic cancer. She presented too late for surgery. The patient has subsequently had an EUS and biopsy. The tumor was seen to be neuroendocrine. It is well encapsulated and enhances as is typical. She does have long standing diabetes wtihout any significant change in blood sugars. She also has diarrhea since her gallbladder was removed (open) 30 years ago.   EUS 11/01/2018 Ardis Hughs 1. An oval mass was identified in the uncinate process of the pancreas. The mass was hypoechoic and heterogenous. The mass measured 26 mm by 24 mm in maximal cross-sectional diameter. The endosonographic borders were well-defined and there was clearly no involvement of any major vascular structures (SMA, celiac trunk, SMA, PV). The remaining pancreatic parenchyma was normal. The main pancreatic duct was normal. Fine needle aspiration for cytology was performed. Color Doppler imaging was utilized prior to needle puncture to confirm a lack of significant vascular structures within the needle path. Three passes were made with the 25 gauge needle using a transduodenal approach. A cytotechnologist was present to  evaluate the adequacy of the specimen. Final cytology results are pending. 2. Normal, non-dilated CBD. 3. No peripancreatic adenopathy. 4. Gallbladder surgically absent. 5. Limited views of the liver, spleen, portal and splenic vessels were all normal.  cytology 10/1 jacobs DIAGNOSIS: - Neuroendocrine neoplasm   Past Surgical History  Gallbladder Surgery - Open   Diagnostic Studies History  Colonoscopy  never Mammogram  within last year Pap Smear  1-5 years ago  Allergies Cipro *FLUOROQUINOLONES*  Allergies Reconciled   Medication History Lisinopril (2.5MG  Tablet, Oral) Active. Lansoprazole (15MG  Capsule DR, Oral) Active. Meclizine HCl (12.5MG  Tablet, Oral) Active. Metoprolol Succinate (50MG  CP24 Sprinkle, Oral) Active. Zofran (4MG  Tablet, Oral) Active. Crestor (10MG  Tablet, Oral) Active. Vitamin D (50000U Capsule, Oral) Active. Tamsulosin HCl (0.4MG  Capsule, Oral) Active. Motrin (600MG  Tablet, Oral) Active. Motrin (400MG  Tablet, Oral) Active. clonazePAM (0.25MG  Tablet Disint, Oral) Active. Medications Reconciled  Social History  Caffeine use  Carbonated beverages, Coffee, Tea. No alcohol use  No drug use  Tobacco use  Never smoker.  Family History Hypertension  Mother. Malignant Neoplasm Of Pancreas  Mother.  Pregnancy / Birth History Age at menarche  40 years. Contraceptive History  Oral contraceptives. Gravida  1 Irregular periods  Maternal age  4-25 Para  1  Other Problems  Anxiety Disorder  Arthritis  Back Pain  Cholelithiasis  Diabetes Mellitus  High blood pressure  Kidney Stone     Review of Systems General Present- Fatigue. Not Present- Appetite Loss, Chills, Fever, Night Sweats, Weight Gain and Weight Loss. Skin Not Present- Change in Wart/Mole, Dryness, Hives, Jaundice, New Lesions, Non-Healing Wounds, Rash and Ulcer. HEENT  Not Present- Earache, Hearing Loss, Hoarseness, Nose Bleed, Oral Ulcers,  Ringing in the Ears, Seasonal Allergies, Sinus Pain, Sore Throat, Visual Disturbances, Wears glasses/contact lenses and Yellow Eyes. Respiratory Not Present- Bloody sputum, Chronic Cough, Difficulty Breathing, Snoring and Wheezing. Breast Not Present- Breast Mass, Breast Pain, Nipple Discharge and Skin Changes. Cardiovascular Present- Rapid Heart Rate. Not Present- Chest Pain, Difficulty Breathing Lying Down, Leg Cramps, Palpitations, Shortness of Breath and Swelling of Extremities. Gastrointestinal Present- Indigestion. Not Present- Abdominal Pain, Bloating, Bloody Stool, Change in Bowel Habits, Chronic diarrhea, Constipation, Difficulty Swallowing, Excessive gas, Gets full quickly at meals, Hemorrhoids, Nausea, Rectal Pain and Vomiting. Female Genitourinary Present- Frequency. Not Present- Nocturia, Painful Urination, Pelvic Pain and Urgency. Musculoskeletal Present- Back Pain, Joint Pain and Joint Stiffness. Not Present- Muscle Pain, Muscle Weakness and Swelling of Extremities. Neurological Not Present- Decreased Memory, Fainting, Headaches, Numbness, Seizures, Tingling, Tremor, Trouble walking and Weakness. Psychiatric Present- Anxiety. Not Present- Bipolar, Change in Sleep Pattern, Depression, Fearful and Frequent crying. Endocrine Not Present- Cold Intolerance, Excessive Hunger, Hair Changes, Heat Intolerance, Hot flashes and New Diabetes. Hematology Not Present- Blood Thinners, Easy Bruising, Excessive bleeding, Gland problems, HIV and Persistent Infections. All other systems negative  Vitals Weight: 249.6 lb Height: 64in Body Surface Area: 2.15 m Body Mass Index: 42.84 kg/m  Temp.: 98.2F  Pulse: 148 (Regular)  BP: 140/90 (Sitting, Left Arm, Standard)       Physical Exam General Mental Status-Alert. General Appearance-Consistent with stated age. Hydration-Well hydrated. Voice-Normal.  Head and Neck Head-normocephalic, atraumatic with no lesions or  palpable masses. Trachea-midline. Thyroid Gland Characteristics - normal size and consistency.  Eye Eyeball - Bilateral-Extraocular movements intact. Sclera/Conjunctiva - Bilateral-No scleral icterus.  Chest and Lung Exam Chest and lung exam reveals -quiet, even and easy respiratory effort with no use of accessory muscles and on auscultation, normal breath sounds, no adventitious sounds and normal vocal resonance. Inspection Chest Wall - Normal. Back - normal.  Cardiovascular Cardiovascular examination reveals -normal heart sounds, regular rate and rhythm with no murmurs and normal pedal pulses bilaterally.  Abdomen Inspection Inspection of the abdomen reveals - No Hernias. Palpation/Percussion Palpation and Percussion of the abdomen reveal - Soft, Non Tender, No Rebound tenderness, No Rigidity (guarding) and No hepatosplenomegaly. Auscultation Auscultation of the abdomen reveals - Bowel sounds normal. Note: right subcostal incision.   Neurologic Neurologic evaluation reveals -alert and oriented x 3 with no impairment of recent or remote memory. Mental Status-Normal.  Musculoskeletal Global Assessment -Note: no gross deformities.  Normal Exam - Left-Upper Extremity Strength Normal and Lower Extremity Strength Normal. Normal Exam - Right-Upper Extremity Strength Normal and Lower Extremity Strength Normal.  Lymphatic Head & Neck  General Head & Neck Lymphatics: Bilateral - Description - Normal. Axillary  General Axillary Region: Bilateral - Description - Normal. Tenderness - Non Tender. Femoral & Inguinal  Generalized Femoral & Inguinal Lymphatics: Bilateral - Description - No Generalized lymphadenopathy.    Assessment & Plan  PRIMARY MALIGNANT NEUROENDOCRINE NEOPLASM OF PANCREAS (C7A.8) Impression: Pt has classic neuroendocrine tumor. Despite the fact that the size is stable, i recommend removal as these can still spread and grow. We do not  have data that they are safe to leave. I will get a NETSPOT scan pre op to make sure there is no evidence of metastatic disease.  I recommend whipple. I would plan to utilize her prior subcostal incision rather than a midline incision.  I discussed the surgery with the patient including diagrams of anatomy. I discussed the potential  for diagnostic laparoscopy. In the case of pancreatic cancer, if spread of the disease is found, we will abort the procedure and not proceed with resection. The rationale for this was discussed with the patient. There has not been data to support resection of Stage IV disease in terms of survival benefit.  We discussed possible complications including: Potential of aborting procedure if tumor is invading the superior mesenteric or hepatic arteries Bleeding Infection and possible wound complications such as hernia Damage to adjacent structures Leak of anastamoses, primarily pancreatic Possible need for other procedures Possible prolonged nausea with possible need for external feeding. Possible prolonged hospital stay. Possible development of diabetes or worsening of current diabetes. Possible pancreatic exocrine insufficiency Prolonged fatigue/weakness/appetite Possible early recurrence of cancer Current Plans PET SCAN TUMOR SKULL BASE TO MID-THIGH [id number]) (Netspot for neuroendocrine tumor of pancreas) You are being scheduled for surgery- Our schedulers will call you.  You should hear from our office's scheduling department within 5 working days about the location, date, and time of surgery. We try to make accommodations for patient's preferences in scheduling surgery, but sometimes the OR schedule or the surgeon's schedule prevents [place] from making those accommodations.  If you have not heard from our office [phone]) in 5 working days, call the office and ask for your surgeon's nurse.  If you have other questions about your diagnosis, plan, or surgery,  call the office and ask for your surgeon's nurse.  Pt Education - flb whipple pt info Referred to Oncology, for evaluation and follow up (Oncology). Routine. FAMILY HISTORY OF PANCREATIC CANCER (Z80.0) Impression: Will contact genetics to check to see if she would benefit from genetic counseling. POSTCHOLECYSTECTOMY DIARRHEA (R19.7) Impression: Will add cholestyramine. Current Plans Started Cholestyramine 4 GM Oral Packet, 1 (one) Packet TID, 90 Packet, 12/10/2018, Ref. x2.

## 2019-01-11 ENCOUNTER — Encounter (HOSPITAL_COMMUNITY): Payer: Self-pay

## 2019-01-11 ENCOUNTER — Encounter (HOSPITAL_COMMUNITY)
Admission: RE | Admit: 2019-01-11 | Discharge: 2019-01-11 | Disposition: A | Discharge status: Home / Self Care | Payer: BC Managed Care – PPO | Source: Ambulatory Visit | Attending: General Surgery | Admitting: General Surgery

## 2019-01-11 ENCOUNTER — Other Ambulatory Visit (HOSPITAL_COMMUNITY)
Admission: RE | Admit: 2019-01-11 | Discharge: 2019-01-11 | Disposition: A | Discharge status: Home / Self Care | Payer: BC Managed Care – PPO | Source: Ambulatory Visit | Attending: General Surgery | Admitting: General Surgery

## 2019-01-11 ENCOUNTER — Other Ambulatory Visit: Payer: Self-pay

## 2019-01-11 DIAGNOSIS — F419 Anxiety disorder, unspecified: Secondary | ICD-10-CM | POA: Diagnosis not present

## 2019-01-11 DIAGNOSIS — C259 Malignant neoplasm of pancreas, unspecified: Secondary | ICD-10-CM | POA: Diagnosis not present

## 2019-01-11 DIAGNOSIS — E119 Type 2 diabetes mellitus without complications: Secondary | ICD-10-CM | POA: Insufficient documentation

## 2019-01-11 DIAGNOSIS — F329 Major depressive disorder, single episode, unspecified: Secondary | ICD-10-CM | POA: Diagnosis not present

## 2019-01-11 DIAGNOSIS — Z6841 Body Mass Index (BMI) 40.0 and over, adult: Secondary | ICD-10-CM | POA: Insufficient documentation

## 2019-01-11 DIAGNOSIS — I10 Essential (primary) hypertension: Secondary | ICD-10-CM | POA: Insufficient documentation

## 2019-01-11 DIAGNOSIS — Z9049 Acquired absence of other specified parts of digestive tract: Secondary | ICD-10-CM | POA: Diagnosis not present

## 2019-01-11 DIAGNOSIS — M47819 Spondylosis without myelopathy or radiculopathy, site unspecified: Secondary | ICD-10-CM | POA: Insufficient documentation

## 2019-01-11 DIAGNOSIS — Z79899 Other long term (current) drug therapy: Secondary | ICD-10-CM | POA: Diagnosis not present

## 2019-01-11 DIAGNOSIS — Z01818 Encounter for other preprocedural examination: Secondary | ICD-10-CM | POA: Insufficient documentation

## 2019-01-11 DIAGNOSIS — K219 Gastro-esophageal reflux disease without esophagitis: Secondary | ICD-10-CM | POA: Insufficient documentation

## 2019-01-11 DIAGNOSIS — M1610 Unilateral primary osteoarthritis, unspecified hip: Secondary | ICD-10-CM | POA: Diagnosis not present

## 2019-01-11 DIAGNOSIS — Z800 Family history of malignant neoplasm of digestive organs: Secondary | ICD-10-CM | POA: Insufficient documentation

## 2019-01-11 DIAGNOSIS — Z20828 Contact with and (suspected) exposure to other viral communicable diseases: Secondary | ICD-10-CM | POA: Insufficient documentation

## 2019-01-11 HISTORY — DX: Depression, unspecified: F32.A

## 2019-01-11 HISTORY — DX: Gastro-esophageal reflux disease without esophagitis: K21.9

## 2019-01-11 HISTORY — DX: Anxiety disorder, unspecified: F41.9

## 2019-01-11 LAB — COMPREHENSIVE METABOLIC PANEL
ALT: 28 U/L (ref 0–44)
AST: 59 U/L — ABNORMAL HIGH (ref 15–41)
Albumin: 3.6 g/dL (ref 3.5–5.0)
Alkaline Phosphatase: 85 U/L (ref 38–126)
Anion gap: 15 (ref 5–15)
BUN: 22 mg/dL — ABNORMAL HIGH (ref 6–20)
CO2: 20 mmol/L — ABNORMAL LOW (ref 22–32)
Calcium: 9.2 mg/dL (ref 8.9–10.3)
Chloride: 105 mmol/L (ref 98–111)
Creatinine, Ser: 1 mg/dL (ref 0.44–1.00)
GFR calc Af Amer: >60 mL/min (ref >60)
GFR calc non Af Amer: >60 mL/min (ref >60)
Glucose, Bld: 128 mg/dL — ABNORMAL HIGH (ref 70–99)
Potassium: 3.6 mmol/L (ref 3.5–5.1)
Sodium: 140 mmol/L (ref 135–145)
Total Bilirubin: 0.7 mg/dL (ref 0.3–1.2)
Total Protein: 7.2 g/dL (ref 6.5–8.1)

## 2019-01-11 LAB — CBC WITH DIFFERENTIAL/PLATELET
Abs Immature Granulocytes: 0.16 10*3/uL — ABNORMAL HIGH (ref 0.00–0.07)
Basophils Absolute: 0.1 10*3/uL (ref 0.0–0.1)
Basophils Relative: 1 %
Eosinophils Absolute: 0.3 10*3/uL (ref 0.0–0.5)
Eosinophils Relative: 3 %
HCT: 43.7 % (ref 36.0–46.0)
Hemoglobin: 13.8 g/dL (ref 12.0–15.0)
Immature Granulocytes: 1 %
Lymphocytes Relative: 19 %
Lymphs Abs: 2.3 10*3/uL (ref 0.7–4.0)
MCH: 28.9 pg (ref 26.0–34.0)
MCHC: 31.6 g/dL (ref 30.0–36.0)
MCV: 91.4 fL (ref 80.0–100.0)
Monocytes Absolute: 0.6 10*3/uL (ref 0.1–1.0)
Monocytes Relative: 5 %
Neutro Abs: 8.7 10*3/uL — ABNORMAL HIGH (ref 1.7–7.7)
Neutrophils Relative %: 71 %
Platelets: 381 10*3/uL (ref 150–400)
RBC: 4.78 MIL/uL (ref 3.87–5.11)
RDW: 13.2 % (ref 11.5–15.5)
WBC: 12.2 10*3/uL — ABNORMAL HIGH (ref 4.0–10.5)
nRBC: 0 % (ref 0.0–0.2)

## 2019-01-11 LAB — URINALYSIS, ROUTINE W REFLEX MICROSCOPIC
Bilirubin Urine: NEGATIVE
Glucose, UA: NEGATIVE mg/dL
Ketones, ur: 5 mg/dL — AB
Nitrite: NEGATIVE
Protein, ur: 30 mg/dL — AB
Specific Gravity, Urine: 1.027 (ref 1.005–1.030)
pH: 5 (ref 5.0–8.0)

## 2019-01-11 LAB — PROTIME-INR
INR: 0.9 (ref 0.8–1.2)
Prothrombin Time: 12.3 seconds (ref 11.4–15.2)

## 2019-01-11 LAB — HEMOGLOBIN A1C
Hgb A1c MFr Bld: 7.5 % — ABNORMAL HIGH (ref 4.8–5.6)
Mean Plasma Glucose: 168.55 mg/dL

## 2019-01-11 LAB — ABO/RH: ABO/RH(D): O POS

## 2019-01-11 LAB — GLUCOSE, CAPILLARY: Glucose-Capillary: 155 mg/dL — ABNORMAL HIGH (ref 70–99)

## 2019-01-11 NOTE — Progress Notes (Signed)
Dr. Stark Klein notified of abnormal labs - call patient w/any concerns or follow-up prior to procedure 01/15/19

## 2019-01-11 NOTE — Progress Notes (Signed)
PCP - Margy Clarks, MD Cardiologist - denies  Chest x-ray - 07/08/18 EKG - 07/09/18 Stress Test - denies ECHO - denies Cardiac Cath - denies  Fasting Blood Sugar - 150 Checks Blood Sugar every other day  Blood Thinner Instructions: N/A Aspirin Instructions: N/A  ERAS Protcol - May drink clear liquids until 0430 DOS PRE-SURGERY Ensure or G2- not provided  COVID TEST- scheduled after PAT today, 01/11/19  Coronavirus Screening  Have you experienced the following symptoms:  Cough yes/no: No Fever (>100.55F)  yes/no: No Runny nose yes/no: No Sore throat yes/no: No Difficulty breathing/shortness of breath  yes/no: No  Have you or a family member traveled in the last 14 days and where? yes/no: No  If the patient indicates "YES" to the above questions, their PAT will be rescheduled to limit the exposure to others and, the surgeon will be notified. THE PATIENT WILL NEED TO BE ASYMPTOMATIC FOR 14 DAYS.   If the patient is not experiencing any of these symptoms, the PAT nurse will instruct them to NOT bring anyone with them to their appointment since they may have these symptoms or traveled as well.   Please remind your patients and families that hospital visitation restrictions are in effect and the importance of the restrictions.   Anesthesia review: Yes; prior complications w/excessive nausea/vomiting  Patient denies shortness of breath, fever, cough and chest pain at PAT appointment  All instructions explained to the patient, with a verbal understanding of the material. Patient agrees to go over the instructions while at home for a better understanding. Patient also instructed to self quarantine after being tested for COVID-19. The opportunity to ask questions was provided.

## 2019-01-11 NOTE — Progress Notes (Signed)
[address] [place], [place], [place] [address] [place] [address] Phone: [phone] Fax: [place], [place] [address] Phone: [phone] Fax: [phone]       Your procedure is scheduled on Tuesday, December 15th, 2020.   Report to [facility] Main Entrance "A" at 5:30 A.M., and check in at the Admitting office.   Call this number if you have problems the morning of surgery:  [phone]  Call [phone] if you have any questions prior to your surgery date Monday-Friday 8am-4pm    Remember:  Do not eat after midnight the night before your surgery  You may drink clear liquids until 4:30AM the morning of your surgery.    Clear liquids allowed are: Water, Non-Citrus Juices (without pulp), Carbonated Beverages, Clear Tea, Black Coffee Only, and Gatorade    Take these medicines the morning of surgery with A SIP OF WATER :  Clonazepam (Klonopin) Meclizine (Antivert) - if needed Omeprazole (Prilosec) Ondansetron (Zofran) - if needed Tramadol (Ultram) - if needed  7 days prior to surgery STOP taking any Aspirin (unless otherwise instructed by your surgeon), Aleve, Naproxen, Ibuprofen, Motrin, Advil, Goody's, BC's, all herbal medications, fish oil, and all vitamins.  WHAT DO I DO ABOUT MY DIABETES MEDICATION?   Marland Kitchen Do not take oral diabetes medicines (Janumet) the morning of surgery.   HOW TO MANAGE YOUR DIABETES BEFORE AND AFTER SURGERY  Why is it important to control my blood sugar before and after surgery? . Improving blood sugar levels before and after surgery helps healing and can limit problems. . A way of improving blood sugar control is eating a healthy diet by: o  Eating less sugar and carbohydrates o  Increasing activity/exercise o  Talking with your doctor about reaching your blood sugar goals . High blood  sugars (greater than 180 mg/dL) can raise your risk of infections and slow your recovery, so you will need to focus on controlling your diabetes during the weeks before surgery. . Make sure that the doctor who takes care of your diabetes knows about your planned surgery including the date and location.  How do I manage my blood sugar before surgery? . Check your blood sugar at least 4 times a day, starting 2 days before surgery, to make sure that the level is not too high or low. . Check your blood sugar the morning of your surgery when you wake up and every 2 hours until you get to the Short Stay unit. o If your blood sugar is less than 70 mg/dL, you will need to treat for low blood sugar: - Do not take insulin. - Treat a low blood sugar (less than 70 mg/dL) with  cup of clear juice (cranberry or apple), 4 glucose tablets, OR glucose gel. - Recheck blood sugar in 15 minutes after treatment (to make sure it is greater than 70 mg/dL). If your blood sugar is not greater than 70 mg/dL on recheck, call [phone] for further instructions. . Report your blood sugar to the short stay nurse when you get to Short Stay.  . If you are admitted to the hospital after surgery: o Your blood sugar will be checked by the staff and you will probably be given insulin after surgery (instead of oral diabetes medicines) to make sure you have good blood sugar levels. o The goal for  blood sugar control after surgery is 80-180 mg/dL.   The Morning of Surgery  Do not wear jewelry, make-up or nail polish.  Do not wear lotions, powders, or perfumes/colognes, or deodorant  Do not shave 48 hours prior to surgery.  Do not bring valuables to the hospital.  [facility] is not responsible for any belongings or valuables.  If you are a smoker, DO NOT Smoke 24 hours prior to surgery  If you wear a CPAP at night please bring your mask, tubing, and machine the morning of surgery   Remember that you must have someone to  transport you home after your surgery, and remain with you for 24 hours if you are discharged the same day.   Please bring cases for contacts, glasses, hearing aids, dentures or bridgework because it cannot be worn into surgery.    Leave your suitcase in the car.  After surgery it may be brought to your room.  For patients admitted to the hospital, discharge time will be determined by your treatment team.  Patients discharged the day of surgery will not be allowed to drive home.    Special instructions:   [facility]- Preparing For Surgery  Before surgery, you can play an important role. Because skin is not sterile, your skin needs to be as free of germs as possible. You can reduce the number of germs on your skin by washing with CHG (chlorahexidine gluconate) Soap before surgery.  CHG is an antiseptic cleaner which kills germs and bonds with the skin to continue killing germs even after washing.    Oral Hygiene is also important to reduce your risk of infection.  Remember - BRUSH YOUR TEETH THE MORNING OF SURGERY WITH YOUR REGULAR TOOTHPASTE  Please do not use if you have an allergy to CHG or antibacterial soaps. If your skin becomes reddened/irritated stop using the CHG.  Do not shave (including legs and underarms) for at least 48 hours prior to first CHG shower. It is OK to shave your face.  Please follow these instructions carefully.   1. Shower the NIGHT BEFORE SURGERY and the MORNING OF SURGERY with CHG Soap.   2. If you chose to wash your hair, wash your hair first as usual with your normal shampoo.  3. After you shampoo, rinse your hair and body thoroughly to remove the shampoo.  4. Use CHG as you would any other liquid soap. You can apply CHG directly to the skin and wash gently with a scrungie or a clean washcloth.   5. Apply the CHG Soap to your body ONLY FROM THE NECK DOWN.  Do not use on open wounds or open sores. Avoid contact with your eyes, ears, mouth and genitals  (private parts). Wash Face and genitals (private parts)  with your normal soap.   6. Wash thoroughly, paying special attention to the area where your surgery will be performed.  7. Thoroughly rinse your body with warm water from the neck down.  8. DO NOT shower/wash with your normal soap after using and rinsing off the CHG Soap.  9. Pat yourself dry with a CLEAN TOWEL.  10. Wear CLEAN PAJAMAS to bed the night before surgery, wear comfortable clothes the morning of surgery  11. Place CLEAN SHEETS on your bed the night of your first shower and DO NOT SLEEP WITH PETS.    Day of Surgery:  Please shower the morning of surgery with the CHG soap Do not apply any deodorants/lotions. Please wear clean  clothes to the hospital/surgery center.   Remember to brush your teeth WITH YOUR REGULAR TOOTHPASTE.   Please read over the following fact sheets that you were given.

## 2019-01-12 LAB — NOVEL CORONAVIRUS, NAA (HOSP ORDER, SEND-OUT TO REF LAB; TAT 18-24 HRS): SARS-CoV-2, NAA: NOT DETECTED

## 2019-01-14 LAB — CHROMOGRANIN A: Chromogranin A (ng/mL): 1664 ng/mL — ABNORMAL HIGH (ref 0.0–101.8)

## 2019-01-14 NOTE — Anesthesia Preprocedure Evaluation (Addendum)
Anesthesia Evaluation  Patient identified by MRN, date of birth, ID band Patient awake    Reviewed: Allergy & Precautions, NPO status , Patient's Chart, lab work & pertinent test results, reviewed documented beta blocker date and time   History of Anesthesia Complications (+) PONV and history of anesthetic complications  Airway Mallampati: II  TM Distance: >3 FB Neck ROM: Full    Dental no notable dental hx.    Pulmonary  COVID-19+ (07/08/18) with viral pneumonia/hypoxia (s/p remdesivir, steroids, temporary supplemental O2)   Pulmonary exam normal        Cardiovascular hypertension, Pt. on medications and Pt. on home beta blockers Normal cardiovascular exam     Neuro/Psych Anxiety Depression negative neurological ROS     GI/Hepatic Neg liver ROS, GERD  Medicated and Controlled,Pancreatic ca   Endo/Other  diabetes, Type 2, Oral Hypoglycemic Agents  Renal/GU negative Renal ROS  negative genitourinary   Musculoskeletal  (+) Arthritis ,   Abdominal   Peds  Hematology negative hematology ROS (+)   Anesthesia Other Findings Day of surgery medications reviewed with patient.  Reproductive/Obstetrics negative OB ROS                           Anesthesia Physical Anesthesia Plan  ASA: III  Anesthesia Plan: General   Post-op Pain Management: GA combined w/ Regional for post-op pain   Induction: Intravenous  PONV Risk Score and Plan: 4 or greater and Scopolamine patch - Pre-op, Treatment may vary due to age or medical condition, Ondansetron, Dexamethasone, Propofol infusion and Midazolam  Airway Management Planned: Oral ETT  Additional Equipment: Arterial line  Intra-op Plan:   Post-operative Plan: Possible Post-op intubation/ventilation  Informed Consent: I have reviewed the patients History and Physical, chart, labs and discussed the procedure including the risks, benefits and  alternatives for the proposed anesthesia with the patient or authorized representative who has indicated his/her understanding and acceptance.     Dental advisory given  Plan Discussed with: CRNA  Anesthesia Plan Comments: (A-line, thoracic epidural, PIVx2 (if unable to place >18g will need CVC). Daiva Huge, MD )     Anesthesia Quick Evaluation

## 2019-01-14 NOTE — Progress Notes (Addendum)
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 A pancreatic lesion was noted on 08/09/18 CT scan for follow-up nephrolithiasis, lesion was felt unchanged in size since 03/06/15. FNA on 11/01/18 showed neuroendocrine neoplasm.   History includes never smoker, post-operative N/V, DM2, HTN, tachycardia, GERD, non-alcoholic fatty liver, nephrolithiasis with left hydronephrosis (resolved w/o nephrostomy tube 07/09/18), COVID-19+ (07/08/18) with viral pneumonia/hypoxia BMI (s/p remdesivir, steroids, temporary supplemental O2), cholecystomy. Her mother died from pancreatic cancer. BMI is consistent with morbid obesity.  Previous COVID-19 infection in June. 01/11/2019 COVID-19 test negative.  Anesthesia team to evaluate on the day of surgery. For urine pregnancy test on arrival.   VS: BP (!) 164/92   Pulse 99   Temp 36.4 C (Oral)   Resp 18   Ht 5\' 4"  (1.626 m)   Wt 114.5 kg   SpO2 98%   BMI 43.32 kg/m     PROVIDERS: Margy Clarks, NP his PCP Manus Rudd, MD is GI    LABS: Labs reviewed: Acceptable for surgery. (all labs ordered are listed, but only abnormal results are displayed)  Labs Reviewed  GLUCOSE, CAPILLARY - Abnormal; Notable for the following components:      Result Value   Glucose-Capillary 155 (*)    All other components within normal limits  CBC WITH DIFFERENTIAL/PLATELET - Abnormal; Notable for the following components:   WBC 12.2 (*)    Neutro Abs 8.7 (*)    Abs Immature Granulocytes 0.16 (*)    All other components within normal limits  COMPREHENSIVE METABOLIC PANEL - Abnormal; Notable for the following components:   CO2 20 (*)     Glucose, Bld 128 (*)    BUN 22 (*)    AST 59 (*)    All other components within normal limits  URINALYSIS, ROUTINE W REFLEX MICROSCOPIC - Abnormal; Notable for the following components:   Color, Urine AMBER (*)    APPearance HAZY (*)    Hgb urine dipstick SMALL (*)    Ketones, ur 5 (*)    Protein, ur 30 (*)    Leukocytes,Ua TRACE (*)    Bacteria, UA RARE (*)    All other components within normal limits  HEMOGLOBIN A1C - Abnormal; Notable for the following components:   Hgb A1c MFr Bld 7.5 (*)    All other components within normal limits  PROTIME-INR  CHROMOGRANIN A  TYPE AND SCREEN     IMAGES: PET scan 01/09/19: IMPRESSION: 1. Two DOTATATE avid lesions with the pancreas consistent well differentiated neuroendocrine tumor. One rounded lesion in the uncinate of the pancreas. Second lesion in the tail the pancreas is less well-defined on noncontrast CT. 2. No evidence of distant metastatic disease. No liver metastasis, peritoneal metastasis or primary bowel lesion.  1V PCXR 07/08/18 (+ COVID-19 at the time): FINDINGS: Widespread interstitial prominence and patchy ill-defined airspace disease scattered throughout the lungs bilaterally. No definite pleural effusions. Pulmonary vasculature is obscured. Heart size is borderline enlarged. Upper mediastinal contours are within normal limits. IMPRESSION: 1. Unusual appearance in the lungs, which based on comparison with recent CT the abdomen and pelvis is favored to  reflect widespread cryptogenic organizing pneumonia, likely sequela of recent atypical infection (including viral pneumonia such as COVID-70).   EKG: 01/11/19:  Probable left atrial enlargement Low voltage, precordial leads LVH with secondary repolarization abnormality Anterior Q waves, possibly due to LVH Confirmed by Milton Ferguson [phone]) on 07/08/2018 9:00:55 PM - Appears to be SR--ST at 119, with normal PR interval. EKG was done in the setting of  nephrolithiasis/hydronephrosis and acute COVID-19 illness.    CV: N/A  Past Medical History:  Diagnosis Date  . Anxiety   . Arthritis 2020   lower back and hip; takes only OTC Motrin  . Cancer [facility]) 2020   pancreatic  . Depression    mild and situational; not medicated  . Diabetes mellitus without complication ([place])    unsure if type 1 or 2 , dx as an adult   . GERD (gastroesophageal reflux disease)   . History of kidney stones 07/2018   hospital[facility] at [facility]; passed on her own  . Hypertension   . NAFL (nonalcoholic fatty liver) 0000000   fatty liver seen on CT scan abd/ pelvis  . Nephrolithiasis    lov in june 2020 , also had covid at this time   . PONV (postoperative nausea and vomiting)   . Tachycardia    generally runs in the 120s    Past Surgical History:  Procedure Laterality Date  . APPENDECTOMY  in 20s  . CHOLECYSTECTOMY  in 20s  . ESOPHAGOGASTRODUODENOSCOPY (EGD) WITH PROPOFOL N/A 11/01/2018   Procedure: ESOPHAGOGASTRODUODENOSCOPY (EGD) WITH PROPOFOL;  Surgeon: Milus Banister, MD;  Location: [facility] ENDOSCOPY;  Service: Endoscopy;  Laterality: N/A;  . EUS N/A 11/01/2018   Procedure: UPPER ENDOSCOPIC ULTRASOUND (EUS) LINEAR;  Surgeon: Milus Banister, MD;  Location: [facility] ENDOSCOPY;  Service: Endoscopy;  Laterality: N/A;  . FINE NEEDLE ASPIRATION N/A 11/01/2018   Procedure: FINE NEEDLE ASPIRATION (FNA) LINEAR;  Surgeon: Milus Banister, MD;  Location: [facility] ENDOSCOPY;  Service: Endoscopy;  Laterality: N/A;    MEDICATIONS: . clonazePAM (KLONOPIN) 0.5 MG tablet  . ibuprofen (ADVIL) 200 MG tablet  . lisinopril-hydrochlorothiazide (ZESTORETIC) 20-12.5 MG tablet  . meclizine (ANTIVERT) 12.5 MG tablet  . Menthol, Topical Analgesic, (BIOFREEZE ROLL-ON EX)  . metoprolol succinate (TOPROL-XL) 50 MG 24 hr tablet  . norgestrel-ethinyl estradiol (CRYSELLE-28) 0.3-30 MG-MCG tablet  . omeprazole (PRILOSEC) 20 MG capsule  . ondansetron (ZOFRAN) 4 MG tablet  . OVER THE COUNTER  MEDICATION  . OVER THE COUNTER MEDICATION  . rosuvastatin (CRESTOR) 10 MG tablet  . SitaGLIPtin-MetFORMIN HCl (JANUMET XR) [phone] MG TB24  . traMADol (ULTRAM) 50 MG tablet  . Vitamin D, Ergocalciferol, (DRISDOL) 1.25 MG (50000 UT) CAPS capsule   No current facility-administered medications for this encounter.    Myra Gianotti, PA-C Surgical Short Stay/Anesthesiology [facility] Phone [phone] [facility] Phone [phone] 01/14/2019 5:00 PM

## 2019-01-15 ENCOUNTER — Encounter (HOSPITAL_COMMUNITY)
Admission: RE | Disposition: A | Discharge status: Home Health | Payer: Self-pay | Source: Home / Self Care | Attending: General Surgery

## 2019-01-15 ENCOUNTER — Other Ambulatory Visit: Payer: Self-pay

## 2019-01-15 ENCOUNTER — Inpatient Hospital Stay (HOSPITAL_COMMUNITY): Payer: BC Managed Care – PPO

## 2019-01-15 ENCOUNTER — Inpatient Hospital Stay (HOSPITAL_COMMUNITY)
Admission: RE | Admit: 2019-01-15 | Discharge: 2019-01-31 | DRG: 826 | Disposition: A | Discharge status: Home Health | Payer: BC Managed Care – PPO | Attending: General Surgery | Admitting: General Surgery

## 2019-01-15 ENCOUNTER — Encounter (HOSPITAL_COMMUNITY): Payer: Self-pay | Admitting: General Surgery

## 2019-01-15 DIAGNOSIS — Z6841 Body Mass Index (BMI) 40.0 and over, adult: Secondary | ICD-10-CM | POA: Diagnosis not present

## 2019-01-15 DIAGNOSIS — R000 Tachycardia, unspecified: Secondary | ICD-10-CM | POA: Diagnosis not present

## 2019-01-15 DIAGNOSIS — D62 Acute posthemorrhagic anemia: Secondary | ICD-10-CM | POA: Diagnosis not present

## 2019-01-15 DIAGNOSIS — Z9889 Other specified postprocedural states: Secondary | ICD-10-CM

## 2019-01-15 DIAGNOSIS — Z8249 Family history of ischemic heart disease and other diseases of the circulatory system: Secondary | ICD-10-CM | POA: Diagnosis not present

## 2019-01-15 DIAGNOSIS — R059 Cough, unspecified: Secondary | ICD-10-CM

## 2019-01-15 DIAGNOSIS — Z8619 Personal history of other infectious and parasitic diseases: Secondary | ICD-10-CM

## 2019-01-15 DIAGNOSIS — E1169 Type 2 diabetes mellitus with other specified complication: Secondary | ICD-10-CM | POA: Diagnosis not present

## 2019-01-15 DIAGNOSIS — R0602 Shortness of breath: Secondary | ICD-10-CM

## 2019-01-15 DIAGNOSIS — E1165 Type 2 diabetes mellitus with hyperglycemia: Secondary | ICD-10-CM | POA: Diagnosis not present

## 2019-01-15 DIAGNOSIS — K567 Ileus, unspecified: Secondary | ICD-10-CM | POA: Diagnosis not present

## 2019-01-15 DIAGNOSIS — E440 Moderate protein-calorie malnutrition: Secondary | ICD-10-CM | POA: Diagnosis not present

## 2019-01-15 DIAGNOSIS — D490 Neoplasm of unspecified behavior of digestive system: Secondary | ICD-10-CM

## 2019-01-15 DIAGNOSIS — M1990 Unspecified osteoarthritis, unspecified site: Secondary | ICD-10-CM | POA: Diagnosis present

## 2019-01-15 DIAGNOSIS — J189 Pneumonia, unspecified organism: Secondary | ICD-10-CM | POA: Diagnosis not present

## 2019-01-15 DIAGNOSIS — E872 Acidosis: Secondary | ICD-10-CM | POA: Diagnosis not present

## 2019-01-15 DIAGNOSIS — E669 Obesity, unspecified: Secondary | ICD-10-CM | POA: Diagnosis present

## 2019-01-15 DIAGNOSIS — J90 Pleural effusion, not elsewhere classified: Secondary | ICD-10-CM | POA: Diagnosis not present

## 2019-01-15 DIAGNOSIS — D72829 Elevated white blood cell count, unspecified: Secondary | ICD-10-CM | POA: Diagnosis not present

## 2019-01-15 DIAGNOSIS — Z978 Presence of other specified devices: Secondary | ICD-10-CM

## 2019-01-15 DIAGNOSIS — G4733 Obstructive sleep apnea (adult) (pediatric): Secondary | ICD-10-CM

## 2019-01-15 DIAGNOSIS — E785 Hyperlipidemia, unspecified: Secondary | ICD-10-CM | POA: Diagnosis present

## 2019-01-15 DIAGNOSIS — J9601 Acute respiratory failure with hypoxia: Secondary | ICD-10-CM

## 2019-01-15 DIAGNOSIS — I10 Essential (primary) hypertension: Secondary | ICD-10-CM | POA: Diagnosis present

## 2019-01-15 DIAGNOSIS — Y95 Nosocomial condition: Secondary | ICD-10-CM | POA: Diagnosis not present

## 2019-01-15 DIAGNOSIS — R062 Wheezing: Secondary | ICD-10-CM

## 2019-01-15 DIAGNOSIS — Z800 Family history of malignant neoplasm of digestive organs: Secondary | ICD-10-CM | POA: Diagnosis not present

## 2019-01-15 DIAGNOSIS — Z452 Encounter for adjustment and management of vascular access device: Secondary | ICD-10-CM

## 2019-01-15 DIAGNOSIS — H8110 Benign paroxysmal vertigo, unspecified ear: Secondary | ICD-10-CM | POA: Diagnosis present

## 2019-01-15 DIAGNOSIS — N179 Acute kidney failure, unspecified: Secondary | ICD-10-CM | POA: Diagnosis not present

## 2019-01-15 DIAGNOSIS — M21371 Foot drop, right foot: Secondary | ICD-10-CM | POA: Diagnosis present

## 2019-01-15 DIAGNOSIS — F329 Major depressive disorder, single episode, unspecified: Secondary | ICD-10-CM | POA: Diagnosis not present

## 2019-01-15 DIAGNOSIS — Z881 Allergy status to other antibiotic agents status: Secondary | ICD-10-CM

## 2019-01-15 DIAGNOSIS — R05 Cough: Secondary | ICD-10-CM

## 2019-01-15 DIAGNOSIS — C7A8 Other malignant neuroendocrine tumors: Secondary | ICD-10-CM | POA: Diagnosis present

## 2019-01-15 DIAGNOSIS — F32A Depression, unspecified: Secondary | ICD-10-CM | POA: Diagnosis present

## 2019-01-15 DIAGNOSIS — K9189 Other postprocedural complications and disorders of digestive system: Secondary | ICD-10-CM | POA: Diagnosis not present

## 2019-01-15 DIAGNOSIS — J9690 Respiratory failure, unspecified, unspecified whether with hypoxia or hypercapnia: Secondary | ICD-10-CM

## 2019-01-15 DIAGNOSIS — Z0189 Encounter for other specified special examinations: Secondary | ICD-10-CM

## 2019-01-15 DIAGNOSIS — K219 Gastro-esophageal reflux disease without esophagitis: Secondary | ICD-10-CM | POA: Diagnosis present

## 2019-01-15 DIAGNOSIS — F410 Panic disorder [episodic paroxysmal anxiety] without agoraphobia: Secondary | ICD-10-CM | POA: Diagnosis not present

## 2019-01-15 DIAGNOSIS — C259 Malignant neoplasm of pancreas, unspecified: Secondary | ICD-10-CM | POA: Insufficient documentation

## 2019-01-15 HISTORY — PX: LAPAROSCOPY: SHX197

## 2019-01-15 HISTORY — PX: WHIPPLE PROCEDURE: SHX2667

## 2019-01-15 LAB — POCT I-STAT 7, (LYTES, BLD GAS, ICA,H+H)
Acid-base deficit: 9 mmol/L — ABNORMAL HIGH (ref 0.0–2.0)
Acid-base deficit: 9 mmol/L — ABNORMAL HIGH (ref 0.0–2.0)
Bicarbonate: 18.2 mmol/L — ABNORMAL LOW (ref 20.0–28.0)
Bicarbonate: 17.1 mmol/L — ABNORMAL LOW (ref 20.0–28.0)
Calcium, Ion: 1.14 mmol/L — ABNORMAL LOW (ref 1.15–1.40)
Calcium, Ion: 1.08 mmol/L — ABNORMAL LOW (ref 1.15–1.40)
HCT: 29 % — ABNORMAL LOW (ref 36.0–46.0)
HCT: 26 % — ABNORMAL LOW (ref 36.0–46.0)
Hemoglobin: 9.9 g/dL — ABNORMAL LOW (ref 12.0–15.0)
Hemoglobin: 8.8 g/dL — ABNORMAL LOW (ref 12.0–15.0)
O2 Saturation: 99 %
O2 Saturation: 100 %
Patient temperature: 35.9
Patient temperature: 36.3
Potassium: 3.9 mmol/L (ref 3.5–5.1)
Potassium: 4.3 mmol/L (ref 3.5–5.1)
Sodium: 138 mmol/L (ref 135–145)
Sodium: 136 mmol/L (ref 135–145)
TCO2: 19 mmol/L — ABNORMAL LOW (ref 22–32)
TCO2: 18 mmol/L — ABNORMAL LOW (ref 22–32)
pCO2 arterial: 39.7 mmHg (ref 32.0–48.0)
pCO2 arterial: 36.6 mmHg (ref 32.0–48.0)
pH, Arterial: 7.263 — ABNORMAL LOW (ref 7.350–7.450)
pH, Arterial: 7.273 — ABNORMAL LOW (ref 7.350–7.450)
pO2, Arterial: 155 mmHg — ABNORMAL HIGH (ref 83.0–108.0)
pO2, Arterial: 205 mmHg — ABNORMAL HIGH (ref 83.0–108.0)

## 2019-01-15 LAB — POCT I-STAT, CHEM 8
BUN: 28 mg/dL — ABNORMAL HIGH (ref 6–20)
Calcium, Ion: 1.11 mmol/L — ABNORMAL LOW (ref 1.15–1.40)
Chloride: 106 mmol/L (ref 98–111)
Creatinine, Ser: 0.9 mg/dL (ref 0.44–1.00)
Glucose, Bld: 275 mg/dL — ABNORMAL HIGH (ref 70–99)
HCT: 26 % — ABNORMAL LOW (ref 36.0–46.0)
Hemoglobin: 8.8 g/dL — ABNORMAL LOW (ref 12.0–15.0)
Potassium: 4.6 mmol/L (ref 3.5–5.1)
Sodium: 137 mmol/L (ref 135–145)
TCO2: 17 mmol/L — ABNORMAL LOW (ref 22–32)

## 2019-01-15 LAB — BLOOD GAS, ARTERIAL
Acid-base deficit: 15.5 mmol/L — ABNORMAL HIGH (ref 0.0–2.0)
Bicarbonate: 11.3 mmol/L — ABNORMAL LOW (ref 20.0–28.0)
FIO2: 21
O2 Saturation: 96.6 %
Patient temperature: 36.7
pCO2 arterial: 31.6 mmHg — ABNORMAL LOW (ref 32.0–48.0)
pH, Arterial: 7.177 — CL (ref 7.350–7.450)
pO2, Arterial: 108 mmHg (ref 83.0–108.0)

## 2019-01-15 LAB — BASIC METABOLIC PANEL
Anion gap: 21 — ABNORMAL HIGH (ref 5–15)
BUN: 23 mg/dL — ABNORMAL HIGH (ref 6–20)
CO2: 11 mmol/L — ABNORMAL LOW (ref 22–32)
Calcium: 7.5 mg/dL — ABNORMAL LOW (ref 8.9–10.3)
Chloride: 105 mmol/L (ref 98–111)
Creatinine, Ser: 1.47 mg/dL — ABNORMAL HIGH (ref 0.44–1.00)
GFR calc Af Amer: 47 mL/min — ABNORMAL LOW (ref >60)
GFR calc non Af Amer: 41 mL/min — ABNORMAL LOW (ref >60)
Glucose, Bld: 340 mg/dL — ABNORMAL HIGH (ref 70–99)
Potassium: 4.9 mmol/L (ref 3.5–5.1)
Sodium: 137 mmol/L (ref 135–145)

## 2019-01-15 LAB — GLUCOSE, CAPILLARY
Glucose-Capillary: 164 mg/dL — ABNORMAL HIGH (ref 70–99)
Glucose-Capillary: 274 mg/dL — ABNORMAL HIGH (ref 70–99)
Glucose-Capillary: 348 mg/dL — ABNORMAL HIGH (ref 70–99)
Glucose-Capillary: 356 mg/dL — ABNORMAL HIGH (ref 70–99)
Glucose-Capillary: 373 mg/dL — ABNORMAL HIGH (ref 70–99)

## 2019-01-15 LAB — CBC
HCT: 28.2 % — ABNORMAL LOW (ref 36.0–46.0)
Hemoglobin: 8.5 g/dL — ABNORMAL LOW (ref 12.0–15.0)
MCH: 29 pg (ref 26.0–34.0)
MCHC: 30.1 g/dL (ref 30.0–36.0)
MCV: 96.2 fL (ref 80.0–100.0)
Platelets: 331 10*3/uL (ref 150–400)
RBC: 2.93 MIL/uL — ABNORMAL LOW (ref 3.87–5.11)
RDW: 13.4 % (ref 11.5–15.5)
WBC: 23 10*3/uL — ABNORMAL HIGH (ref 4.0–10.5)
nRBC: 0 % (ref 0.0–0.2)

## 2019-01-15 LAB — PREPARE RBC (CROSSMATCH)

## 2019-01-15 LAB — MRSA PCR SCREENING: MRSA by PCR: NEGATIVE

## 2019-01-15 LAB — POCT PREGNANCY, URINE: Preg Test, Ur: NEGATIVE

## 2019-01-15 SURGERY — WHIPPLE PROCEDURE
Anesthesia: General | Site: Abdomen

## 2019-01-15 MED ORDER — INSULIN ASPART 100 UNIT/ML ~~LOC~~ SOLN
SUBCUTANEOUS | Status: AC
  Filled 2019-01-15: qty 1

## 2019-01-15 MED ORDER — PROMETHAZINE HCL 25 MG/ML IJ SOLN
6.2500 mg | INTRAMUSCULAR | Status: DC | PRN
  Administered 2019-01-15: 12.5 mg via INTRAVENOUS
  Filled 2019-01-15: qty 1

## 2019-01-15 MED ORDER — ROCURONIUM BROMIDE 10 MG/ML (PF) SYRINGE
PREFILLED_SYRINGE | INTRAVENOUS | Status: AC
  Filled 2019-01-15: qty 10

## 2019-01-15 MED ORDER — FENTANYL CITRATE (PF) 100 MCG/2ML IJ SOLN: 25.0000 ug | INTRAMUSCULAR | Status: DC | PRN

## 2019-01-15 MED ORDER — LIDOCAINE 2% (20 MG/ML) 5 ML SYRINGE
INTRAMUSCULAR | Status: DC | PRN
  Administered 2019-01-15: 100 mg via INTRAVENOUS

## 2019-01-15 MED ORDER — OXYCODONE HCL 5 MG PO TABS: 5.0000 mg | ORAL_TABLET | Freq: Once | ORAL | Status: DC | PRN

## 2019-01-15 MED ORDER — FENTANYL CITRATE (PF) 100 MCG/2ML IJ SOLN
INTRAMUSCULAR | Status: DC | PRN
  Administered 2019-01-15: 100 ug via INTRAVENOUS
  Administered 2019-01-15 (×3): 50 ug via INTRAVENOUS

## 2019-01-15 MED ORDER — METOPROLOL TARTRATE 5 MG/5ML IV SOLN: 5.0000 mg | Freq: Four times a day (QID) | INTRAVENOUS | Status: DC | PRN

## 2019-01-15 MED ORDER — MIDAZOLAM HCL 2 MG/2ML IJ SOLN
INTRAMUSCULAR | Status: AC
  Filled 2019-01-15: qty 2

## 2019-01-15 MED ORDER — LACTATED RINGERS IV SOLN: INTRAVENOUS | Status: DC | PRN

## 2019-01-15 MED ORDER — PHENYLEPHRINE CONCENTRATED 100MG/250ML (0.4 MG/ML) INFUSION SIMPLE
0.0000 ug/min | INTRAVENOUS | Status: DC
  Administered 2019-01-15: 40 ug/min via INTRAVENOUS
  Filled 2019-01-15 (×2): qty 250

## 2019-01-15 MED ORDER — DIPHENHYDRAMINE HCL 12.5 MG/5ML PO ELIX: 12.5000 mg | ORAL_SOLUTION | Freq: Four times a day (QID) | ORAL | Status: DC | PRN

## 2019-01-15 MED ORDER — METHOCARBAMOL 1000 MG/10ML IJ SOLN
500.0000 mg | Freq: Three times a day (TID) | INTRAVENOUS | Status: DC | PRN
  Administered 2019-01-16: 500 mg via INTRAVENOUS
  Filled 2019-01-15 (×4): qty 5

## 2019-01-15 MED ORDER — PROPOFOL 500 MG/50ML IV EMUL
INTRAVENOUS | Status: DC | PRN
  Administered 2019-01-15: 25 ug/kg/min via INTRAVENOUS

## 2019-01-15 MED ORDER — SODIUM CHLORIDE 0.9% FLUSH: 10.0000 mL | INTRAVENOUS | Status: DC | PRN

## 2019-01-15 MED ORDER — MENTHOL (TOPICAL ANALGESIC) 4.5 % EX GEL: 1.0000 "application " | Freq: Three times a day (TID) | CUTANEOUS | Status: DC | PRN

## 2019-01-15 MED ORDER — PHENYLEPHRINE HCL-NACL 10-0.9 MG/250ML-% IV SOLN
INTRAVENOUS | Status: DC | PRN
  Administered 2019-01-15: 25 ug/min via INTRAVENOUS
  Administered 2019-01-15: 113.3 ug/min via INTRAVENOUS

## 2019-01-15 MED ORDER — ONDANSETRON HCL 4 MG/2ML IJ SOLN
INTRAMUSCULAR | Status: DC | PRN
  Administered 2019-01-15 (×2): 4 mg via INTRAVENOUS

## 2019-01-15 MED ORDER — HYDROMORPHONE 1 MG/ML IV SOLN
INTRAVENOUS | Status: DC
  Administered 2019-01-16: 0.6 mg via INTRAVENOUS
  Administered 2019-01-16: 1.2 mg via INTRAVENOUS
  Administered 2019-01-16: 1.1 mg via INTRAVENOUS
  Administered 2019-01-16: 30 mg via INTRAVENOUS
  Administered 2019-01-16: 1.2 mg via INTRAVENOUS
  Administered 2019-01-17: 0.9 mg via INTRAVENOUS
  Administered 2019-01-17: 0.6 mg via INTRAVENOUS
  Administered 2019-01-17: 0.9 mg via INTRAVENOUS
  Administered 2019-01-18: 1.6 mg via INTRAVENOUS
  Administered 2019-01-18: 2.4 mg via INTRAVENOUS
  Administered 2019-01-18: 1.8 mg via INTRAVENOUS
  Administered 2019-01-18 (×2): 0.6 mg via INTRAVENOUS
  Administered 2019-01-19: 1.8 mg via INTRAVENOUS
  Administered 2019-01-19: 0.9 mg via INTRAVENOUS
  Administered 2019-01-19 (×2): 1.5 mg via INTRAVENOUS
  Administered 2019-01-19: 0.6 mg via INTRAVENOUS
  Administered 2019-01-19: 2.1 mg via INTRAVENOUS
  Administered 2019-01-19: 30 mg via INTRAVENOUS
  Administered 2019-01-20: 1.5 mg via INTRAVENOUS
  Filled 2019-01-15 (×2): qty 30

## 2019-01-15 MED ORDER — DIPHENHYDRAMINE HCL 50 MG/ML IJ SOLN: 12.5000 mg | Freq: Four times a day (QID) | INTRAMUSCULAR | Status: DC | PRN

## 2019-01-15 MED ORDER — CHLORHEXIDINE GLUCONATE CLOTH 2 % EX PADS
6.0000 | MEDICATED_PAD | Freq: Every day | CUTANEOUS | Status: DC
  Administered 2019-01-15 – 2019-01-30 (×14): 6 via TOPICAL

## 2019-01-15 MED ORDER — MIDAZOLAM HCL 5 MG/5ML IJ SOLN
INTRAMUSCULAR | Status: DC | PRN
  Administered 2019-01-15: 2 mg via INTRAVENOUS

## 2019-01-15 MED ORDER — ONDANSETRON HCL 4 MG/2ML IJ SOLN
INTRAMUSCULAR | Status: AC
  Filled 2019-01-15: qty 2

## 2019-01-15 MED ORDER — NALOXONE HCL 0.4 MG/ML IJ SOLN: 0.4000 mg | INTRAMUSCULAR | Status: DC | PRN

## 2019-01-15 MED ORDER — DEXAMETHASONE SODIUM PHOSPHATE 4 MG/ML IJ SOLN
INTRAMUSCULAR | Status: DC | PRN
  Administered 2019-01-15: 5 mg via INTRAVENOUS

## 2019-01-15 MED ORDER — ROCURONIUM BROMIDE 50 MG/5ML IV SOSY
PREFILLED_SYRINGE | INTRAVENOUS | Status: DC | PRN
  Administered 2019-01-15 (×2): 40 mg via INTRAVENOUS
  Administered 2019-01-15: 20 mg via INTRAVENOUS
  Administered 2019-01-15: 40 mg via INTRAVENOUS
  Administered 2019-01-15: 20 mg via INTRAVENOUS
  Administered 2019-01-15: 80 mg via INTRAVENOUS
  Administered 2019-01-15: 20 mg via INTRAVENOUS
  Administered 2019-01-15: 40 mg via INTRAVENOUS

## 2019-01-15 MED ORDER — DEXAMETHASONE SODIUM PHOSPHATE 10 MG/ML IJ SOLN
INTRAMUSCULAR | Status: AC
  Filled 2019-01-15: qty 1

## 2019-01-15 MED ORDER — SCOPOLAMINE 1 MG/3DAYS TD PT72
MEDICATED_PATCH | TRANSDERMAL | Status: DC | PRN
  Administered 2019-01-15: 1 via TRANSDERMAL

## 2019-01-15 MED ORDER — CEFAZOLIN SODIUM 1 G IJ SOLR
INTRAMUSCULAR | Status: AC
  Filled 2019-01-15: qty 20

## 2019-01-15 MED ORDER — INSULIN ASPART 100 UNIT/ML ~~LOC~~ SOLN
0.0000 [IU] | SUBCUTANEOUS | Status: DC
  Administered 2019-01-15: 20 [IU] via SUBCUTANEOUS
  Administered 2019-01-16: 7 [IU] via SUBCUTANEOUS
  Administered 2019-01-16: 11 [IU] via SUBCUTANEOUS
  Administered 2019-01-16: 7 [IU] via SUBCUTANEOUS
  Administered 2019-01-16: 4 [IU] via SUBCUTANEOUS
  Administered 2019-01-16 – 2019-01-17 (×2): 7 [IU] via SUBCUTANEOUS
  Administered 2019-01-17: 4 [IU] via SUBCUTANEOUS
  Administered 2019-01-17: 7 [IU] via SUBCUTANEOUS
  Administered 2019-01-17: 4 [IU] via SUBCUTANEOUS
  Administered 2019-01-17 – 2019-01-18 (×4): 7 [IU] via SUBCUTANEOUS
  Administered 2019-01-18: 11 [IU] via SUBCUTANEOUS
  Administered 2019-01-18: 4 [IU] via SUBCUTANEOUS
  Administered 2019-01-18: 11 [IU] via SUBCUTANEOUS
  Administered 2019-01-18: 4 [IU] via SUBCUTANEOUS
  Administered 2019-01-19 (×4): 11 [IU] via SUBCUTANEOUS
  Administered 2019-01-19 (×2): 7 [IU] via SUBCUTANEOUS
  Administered 2019-01-20: 11 [IU] via SUBCUTANEOUS
  Administered 2019-01-20: 7 [IU] via SUBCUTANEOUS
  Administered 2019-01-20: 4 [IU] via SUBCUTANEOUS
  Administered 2019-01-20 – 2019-01-21 (×4): 11 [IU] via SUBCUTANEOUS
  Administered 2019-01-21: 3 [IU] via SUBCUTANEOUS
  Administered 2019-01-21 (×2): 7 [IU] via SUBCUTANEOUS
  Administered 2019-01-21: 4 [IU] via SUBCUTANEOUS
  Administered 2019-01-21: 7 [IU] via SUBCUTANEOUS
  Administered 2019-01-22: 4 [IU] via SUBCUTANEOUS
  Administered 2019-01-22 (×2): 7 [IU] via SUBCUTANEOUS
  Administered 2019-01-22: 11 [IU] via SUBCUTANEOUS
  Administered 2019-01-22: 7 [IU] via SUBCUTANEOUS
  Administered 2019-01-22: 11 [IU] via SUBCUTANEOUS
  Administered 2019-01-23: 15 [IU] via SUBCUTANEOUS
  Administered 2019-01-23: 08:00:00 11 [IU] via SUBCUTANEOUS
  Administered 2019-01-23: 4 [IU] via SUBCUTANEOUS
  Administered 2019-01-23: 7 [IU] via SUBCUTANEOUS
  Administered 2019-01-23: 11 [IU] via SUBCUTANEOUS
  Administered 2019-01-24: 4 [IU] via SUBCUTANEOUS
  Administered 2019-01-24 (×4): 7 [IU] via SUBCUTANEOUS
  Administered 2019-01-25 (×2): 4 [IU] via SUBCUTANEOUS
  Administered 2019-01-25: 7 [IU] via SUBCUTANEOUS
  Administered 2019-01-25 (×2): 4 [IU] via SUBCUTANEOUS
  Administered 2019-01-25: 20:00:00 3 [IU] via SUBCUTANEOUS
  Administered 2019-01-25: 4 [IU] via SUBCUTANEOUS
  Administered 2019-01-26 (×2): 3 [IU] via SUBCUTANEOUS
  Administered 2019-01-26 – 2019-01-27 (×2): 4 [IU] via SUBCUTANEOUS
  Administered 2019-01-27: 3 [IU] via SUBCUTANEOUS
  Administered 2019-01-27 (×2): 4 [IU] via SUBCUTANEOUS
  Administered 2019-01-27: 3 [IU] via SUBCUTANEOUS
  Administered 2019-01-27: 4 [IU] via SUBCUTANEOUS
  Administered 2019-01-28: 3 [IU] via SUBCUTANEOUS
  Administered 2019-01-28 (×5): 4 [IU] via SUBCUTANEOUS
  Administered 2019-01-29: 3 [IU] via SUBCUTANEOUS
  Administered 2019-01-29: 4 [IU] via SUBCUTANEOUS
  Administered 2019-01-29 – 2019-01-30 (×3): 3 [IU] via SUBCUTANEOUS

## 2019-01-15 MED ORDER — MUSCLE RUB 10-15 % EX CREA
TOPICAL_CREAM | Freq: Three times a day (TID) | CUTANEOUS | Status: DC | PRN
  Filled 2019-01-15: qty 85

## 2019-01-15 MED ORDER — SUGAMMADEX SODIUM 200 MG/2ML IV SOLN
INTRAVENOUS | Status: DC | PRN
  Administered 2019-01-15: 228.6 mg via INTRAVENOUS

## 2019-01-15 MED ORDER — CEFAZOLIN SODIUM-DEXTROSE 2-4 GM/100ML-% IV SOLN
2.0000 g | Freq: Three times a day (TID) | INTRAVENOUS | Status: AC
  Administered 2019-01-15: 2 g via INTRAVENOUS
  Filled 2019-01-15: qty 100

## 2019-01-15 MED ORDER — ALBUMIN HUMAN 5 % IV SOLN: INTRAVENOUS | Status: DC | PRN

## 2019-01-15 MED ORDER — CHLORHEXIDINE GLUCONATE CLOTH 2 % EX PADS: 6.0000 | MEDICATED_PAD | Freq: Once | CUTANEOUS | Status: DC

## 2019-01-15 MED ORDER — PHENYLEPHRINE HCL (PRESSORS) 10 MG/ML IV SOLN
INTRAVENOUS | Status: AC
  Filled 2019-01-15: qty 1

## 2019-01-15 MED ORDER — BUPIVACAINE HCL 0.25 % IJ SOLN
INTRAMUSCULAR | Status: DC | PRN
  Administered 2019-01-15: 5 mL

## 2019-01-15 MED ORDER — OXYCODONE HCL 5 MG/5ML PO SOLN: 5.0000 mg | Freq: Once | ORAL | Status: DC | PRN

## 2019-01-15 MED ORDER — ACETAMINOPHEN 500 MG PO TABS
1000.0000 mg | ORAL_TABLET | Freq: Once | ORAL | Status: AC
  Administered 2019-01-15: 1000 mg via ORAL
  Filled 2019-01-15: qty 2

## 2019-01-15 MED ORDER — VISTASEAL 10 ML SINGLE DOSE KIT
10.0000 mL | PACK | Freq: Once | CUTANEOUS | Status: DC
  Filled 2019-01-15: qty 10

## 2019-01-15 MED ORDER — BUPIVACAINE HCL (PF) 0.25 % IJ SOLN
INTRAMUSCULAR | Status: AC
  Filled 2019-01-15: qty 30

## 2019-01-15 MED ORDER — ROPIVACAINE HCL 2 MG/ML IJ SOLN
10.0000 mL/h | INTRAMUSCULAR | Status: DC
  Administered 2019-01-15: 10 mL/h via EPIDURAL
  Administered 2019-01-15: 12:00:00 8 mL/h via EPIDURAL
  Filled 2019-01-15: qty 200

## 2019-01-15 MED ORDER — CHLORHEXIDINE GLUCONATE 0.12 % MT SOLN
15.0000 mL | Freq: Two times a day (BID) | OROMUCOSAL | Status: DC
  Administered 2019-01-17 – 2019-01-22 (×10): 15 mL via OROMUCOSAL
  Filled 2019-01-15 (×5): qty 15

## 2019-01-15 MED ORDER — GABAPENTIN 100 MG PO CAPS
200.0000 mg | ORAL_CAPSULE | ORAL | Status: AC
  Administered 2019-01-15: 200 mg via ORAL
  Filled 2019-01-15: qty 2

## 2019-01-15 MED ORDER — KCL IN DEXTROSE-NACL 20-5-0.45 MEQ/L-%-% IV SOLN
INTRAVENOUS | Status: DC
  Filled 2019-01-15 (×2): qty 1000

## 2019-01-15 MED ORDER — ACETAMINOPHEN 10 MG/ML IV SOLN
1000.0000 mg | Freq: Four times a day (QID) | INTRAVENOUS | Status: AC
  Administered 2019-01-15 – 2019-01-16 (×4): 1000 mg via INTRAVENOUS
  Filled 2019-01-15 (×4): qty 100

## 2019-01-15 MED ORDER — PHENYLEPHRINE HCL-NACL 10-0.9 MG/250ML-% IV SOLN
INTRAVENOUS | Status: AC
  Filled 2019-01-15: qty 250

## 2019-01-15 MED ORDER — LIDOCAINE-EPINEPHRINE 1 %-1:100000 IJ SOLN
INTRAMUSCULAR | Status: DC | PRN
  Administered 2019-01-15: 5 mL

## 2019-01-15 MED ORDER — INSULIN ASPART 100 UNIT/ML ~~LOC~~ SOLN
0.0000 [IU] | SUBCUTANEOUS | Status: DC
  Administered 2019-01-15: 8 [IU] via SUBCUTANEOUS
  Administered 2019-01-15: 11 [IU] via SUBCUTANEOUS

## 2019-01-15 MED ORDER — ORAL CARE MOUTH RINSE
15.0000 mL | Freq: Two times a day (BID) | OROMUCOSAL | Status: DC
  Administered 2019-01-16 – 2019-01-19 (×6): 15 mL via OROMUCOSAL

## 2019-01-15 MED ORDER — MENTHOL (TOPICAL ANALGESIC) 4 % EX GEL: Freq: Three times a day (TID) | CUTANEOUS | Status: DC | PRN

## 2019-01-15 MED ORDER — NOREPINEPHRINE 4 MG/250ML-% IV SOLN
0.0000 ug/min | INTRAVENOUS | Status: AC
  Administered 2019-01-15: 13:00:00 2 ug/min via INTRAVENOUS
  Filled 2019-01-15: qty 250

## 2019-01-15 MED ORDER — PROCHLORPERAZINE EDISYLATE 10 MG/2ML IJ SOLN
5.0000 mg | Freq: Four times a day (QID) | INTRAMUSCULAR | Status: DC | PRN
  Administered 2019-01-20: 10 mg via INTRAVENOUS
  Administered 2019-01-20: 5 mg via INTRAVENOUS
  Administered 2019-01-23: 10 mg via INTRAVENOUS
  Filled 2019-01-15 (×4): qty 2

## 2019-01-15 MED ORDER — LORAZEPAM BOLUS VIA INFUSION
0.5000 mg | Freq: Three times a day (TID) | INTRAVENOUS | Status: DC | PRN
  Filled 2019-01-15: qty 1

## 2019-01-15 MED ORDER — ALBUMIN HUMAN 25 % IV SOLN
25.0000 g | Freq: Four times a day (QID) | INTRAVENOUS | Status: AC
  Administered 2019-01-15 – 2019-01-17 (×8): 25 g via INTRAVENOUS
  Filled 2019-01-15 (×2): qty 100
  Filled 2019-01-15: qty 50
  Filled 2019-01-15: qty 100
  Filled 2019-01-15: qty 50
  Filled 2019-01-15: qty 100
  Filled 2019-01-15: qty 50
  Filled 2019-01-15 (×2): qty 100

## 2019-01-15 MED ORDER — WATER FOR IRRIGATION, STERILE IR SOLN
Status: DC | PRN
  Administered 2019-01-15: 1000 mL

## 2019-01-15 MED ORDER — PROPOFOL 10 MG/ML IV BOLUS
INTRAVENOUS | Status: AC
  Filled 2019-01-15: qty 40

## 2019-01-15 MED ORDER — PHENYLEPHRINE 40 MCG/ML (10ML) SYRINGE FOR IV PUSH (FOR BLOOD PRESSURE SUPPORT)
PREFILLED_SYRINGE | INTRAVENOUS | Status: DC | PRN
  Administered 2019-01-15 (×2): 120 ug via INTRAVENOUS
  Administered 2019-01-15 (×4): 80 ug via INTRAVENOUS
  Administered 2019-01-15: 120 ug via INTRAVENOUS
  Administered 2019-01-15: 80 ug via INTRAVENOUS
  Administered 2019-01-15: 120 ug via INTRAVENOUS

## 2019-01-15 MED ORDER — PHENYLEPHRINE HCL-NACL 10-0.9 MG/250ML-% IV SOLN
0.0000 ug/min | INTRAVENOUS | Status: DC
  Filled 2019-01-15 (×2): qty 250

## 2019-01-15 MED ORDER — VISTASEAL 10 ML SINGLE DOSE KIT
PACK | CUTANEOUS | Status: DC | PRN
  Administered 2019-01-15: 10 mL via TOPICAL

## 2019-01-15 MED ORDER — DIPHENHYDRAMINE HCL 50 MG/ML IJ SOLN
INTRAMUSCULAR | Status: DC | PRN
  Administered 2019-01-15: 12.5 mg via INTRAVENOUS

## 2019-01-15 MED ORDER — SODIUM CHLORIDE 0.9% FLUSH: 9.0000 mL | INTRAVENOUS | Status: DC | PRN

## 2019-01-15 MED ORDER — SODIUM CHLORIDE 0.9% FLUSH
10.0000 mL | Freq: Two times a day (BID) | INTRAVENOUS | Status: DC
  Administered 2019-01-16: 10 mL
  Administered 2019-01-17: 40 mL
  Administered 2019-01-17: 10 mL
  Administered 2019-01-18: 20 mL
  Administered 2019-01-18 – 2019-01-20 (×4): 10 mL

## 2019-01-15 MED ORDER — ONDANSETRON HCL 4 MG/2ML IJ SOLN
4.0000 mg | Freq: Four times a day (QID) | INTRAMUSCULAR | Status: DC | PRN
  Administered 2019-01-15 – 2019-01-19 (×6): 4 mg via INTRAVENOUS
  Filled 2019-01-15 (×7): qty 2

## 2019-01-15 MED ORDER — HYDROMORPHONE HCL 1 MG/ML IJ SOLN
INTRAMUSCULAR | Status: AC
  Filled 2019-01-15: qty 0.5

## 2019-01-15 MED ORDER — HYDROMORPHONE HCL 1 MG/ML IJ SOLN
INTRAMUSCULAR | Status: DC | PRN
  Administered 2019-01-15: .5 mg via INTRAVENOUS

## 2019-01-15 MED ORDER — FENTANYL CITRATE (PF) 250 MCG/5ML IJ SOLN
INTRAMUSCULAR | Status: AC
  Filled 2019-01-15: qty 5

## 2019-01-15 MED ORDER — PROCHLORPERAZINE MALEATE 10 MG PO TABS
10.0000 mg | ORAL_TABLET | Freq: Four times a day (QID) | ORAL | Status: DC | PRN
  Administered 2019-01-30: 10 mg via ORAL
  Filled 2019-01-15 (×3): qty 1

## 2019-01-15 MED ORDER — LIDOCAINE-EPINEPHRINE 1 %-1:100000 IJ SOLN
INTRAMUSCULAR | Status: AC
  Filled 2019-01-15: qty 1

## 2019-01-15 MED ORDER — DEXMEDETOMIDINE HCL IN NACL 80 MCG/20ML IV SOLN
INTRAVENOUS | Status: AC
  Filled 2019-01-15: qty 20

## 2019-01-15 MED ORDER — CEFAZOLIN SODIUM-DEXTROSE 2-4 GM/100ML-% IV SOLN
2.0000 g | INTRAVENOUS | Status: AC
  Administered 2019-01-15: 2 g via INTRAVENOUS
  Filled 2019-01-15: qty 100

## 2019-01-15 MED ORDER — 0.9 % SODIUM CHLORIDE (POUR BTL) OPTIME
TOPICAL | Status: DC | PRN
  Administered 2019-01-15 (×2): 2000 mL
  Administered 2019-01-15: 1000 mL

## 2019-01-15 MED ORDER — ROPIVACAINE HCL 2 MG/ML IJ SOLN
10.0000 mL/h | INTRAMUSCULAR | Status: DC
  Administered 2019-01-15 – 2019-01-16 (×2): 10 mL/h via EPIDURAL
  Administered 2019-01-18: 12 mL/h via EPIDURAL
  Filled 2019-01-15 (×5): qty 200

## 2019-01-15 MED ORDER — PROPOFOL 10 MG/ML IV BOLUS
INTRAVENOUS | Status: DC | PRN
  Administered 2019-01-15: 200 mg via INTRAVENOUS

## 2019-01-15 SURGICAL SUPPLY — 123 items
BAG BILE T-TUBES STRL (MISCELLANEOUS) ×6 IMPLANT
BIOPATCH RED 1 DISK 7.0 (GAUZE/BANDAGES/DRESSINGS) ×6 IMPLANT
BLADE CLIPPER SURG (BLADE) IMPLANT
BLADE SURG 10 STRL SS (BLADE) ×2 IMPLANT
BOOT SUTURE AID YELLOW STND (SUTURE) ×4 IMPLANT
CANISTER SUCT 3000ML PPV (MISCELLANEOUS) ×2 IMPLANT
CATH KIT ON-Q SILVERSOAK 7.5IN (CATHETERS) IMPLANT
CATH ROBINSON RED A/P 16FR (CATHETERS) IMPLANT
CHLORAPREP W/TINT 26 (MISCELLANEOUS) ×2 IMPLANT
CLIP VESOCCLUDE LG 6/CT (CLIP) ×2 IMPLANT
CLIP VESOCCLUDE MED 24/CT (CLIP) ×2 IMPLANT
CLIP VESOLOCK LG 6/CT PURPLE (CLIP) ×8 IMPLANT
CLIP VESOLOCK MED 6/CT (CLIP) ×2 IMPLANT
CLIP VESOLOCK MED LG 6/CT (CLIP) ×2 IMPLANT
CONT SPEC 4OZ CLIKSEAL STRL BL (MISCELLANEOUS) ×2 IMPLANT
COUNTER NEEDLE 20 DBL MAG RED (NEEDLE) IMPLANT
COVER MAYO STAND STRL (DRAPES) ×2 IMPLANT
COVER SURGICAL LIGHT HANDLE (MISCELLANEOUS) ×2 IMPLANT
COVER WAND RF STERILE (DRAPES) IMPLANT
DECANTER SPIKE VIAL GLASS SM (MISCELLANEOUS) IMPLANT
DERMABOND ADVANCED (GAUZE/BANDAGES/DRESSINGS)
DERMABOND ADVANCED .7 DNX12 (GAUZE/BANDAGES/DRESSINGS) IMPLANT
DRAIN CHANNEL 19F RND (DRAIN) ×6 IMPLANT
DRAIN PENROSE 18X1/2 LTX STRL (DRAIN) ×2 IMPLANT
DRAPE LAPAROSCOPIC ABDOMINAL (DRAPES) ×2 IMPLANT
DRAPE WARM FLUID 44X44 (DRAPES) ×4 IMPLANT
DRSG COVADERM 4X10 (GAUZE/BANDAGES/DRESSINGS) IMPLANT
DRSG COVADERM 4X14 (GAUZE/BANDAGES/DRESSINGS) ×2 IMPLANT
DRSG COVADERM 4X6 (GAUZE/BANDAGES/DRESSINGS) IMPLANT
DRSG COVADERM 4X8 (GAUZE/BANDAGES/DRESSINGS) IMPLANT
DRSG COVADERM PLUS 2X2 (GAUZE/BANDAGES/DRESSINGS) ×2 IMPLANT
DRSG TEGADERM 4X4.75 (GAUZE/BANDAGES/DRESSINGS) ×4 IMPLANT
DRSG TELFA 3X8 NADH (GAUZE/BANDAGES/DRESSINGS) ×2 IMPLANT
ELECT BLADE 4.0 EZ CLEAN MEGAD (MISCELLANEOUS) ×2
ELECT BLADE 6.5 EXT (BLADE) ×2 IMPLANT
ELECT CAUTERY BLADE 6.4 (BLADE) ×2 IMPLANT
ELECT REM PT RETURN 9FT ADLT (ELECTROSURGICAL) ×2
ELECTRODE BLDE 4.0 EZ CLN MEGD (MISCELLANEOUS) ×1 IMPLANT
ELECTRODE REM PT RTRN 9FT ADLT (ELECTROSURGICAL) ×1 IMPLANT
GAUZE 4X4 16PLY RFD (DISPOSABLE) IMPLANT
GAUZE SPONGE 4X4 12PLY STRL (GAUZE/BANDAGES/DRESSINGS) IMPLANT
GEL ULTRASOUND 20GR AQUASONIC (MISCELLANEOUS) IMPLANT
GLOVE BIO SURGEON STRL SZ 6 (GLOVE) ×6 IMPLANT
GLOVE INDICATOR 6.5 STRL GRN (GLOVE) ×6 IMPLANT
GOWN STRL REUS W/ TWL LRG LVL3 (GOWN DISPOSABLE) ×4 IMPLANT
GOWN STRL REUS W/TWL 2XL LVL3 (GOWN DISPOSABLE) ×6 IMPLANT
GOWN STRL REUS W/TWL LRG LVL3 (GOWN DISPOSABLE) ×4
HANDLE SUCTION POOLE (INSTRUMENTS) ×1 IMPLANT
HEMOSTAT SURGICEL 2X14 (HEMOSTASIS) IMPLANT
KIT BASIN OR (CUSTOM PROCEDURE TRAY) ×2 IMPLANT
KIT MARKER MARGIN INK (KITS) ×2 IMPLANT
KIT TUBE JEJUNAL 16FR (CATHETERS) IMPLANT
KIT TURNOVER KIT B (KITS) ×2 IMPLANT
L-HOOK LAP DISP 36CM (ELECTROSURGICAL) ×2
LHOOK LAP DISP 36CM (ELECTROSURGICAL) ×1 IMPLANT
LOOP VESSEL MAXI BLUE (MISCELLANEOUS) ×2 IMPLANT
LOOP VESSEL MINI RED (MISCELLANEOUS) ×2 IMPLANT
NEEDLE 22X1 1/2 (OR ONLY) (NEEDLE) ×2 IMPLANT
NS IRRIG 1000ML POUR BTL (IV SOLUTION) ×4 IMPLANT
PACK GENERAL/GYN (CUSTOM PROCEDURE TRAY) ×2 IMPLANT
PAD ARMBOARD 7.5X6 YLW CONV (MISCELLANEOUS) ×4 IMPLANT
PENCIL BUTTON HOLSTER BLD 10FT (ELECTRODE) IMPLANT
PENCIL SMOKE EVACUATOR (MISCELLANEOUS) ×2 IMPLANT
PLUG CATH AND CAP STER (CATHETERS) IMPLANT
RELOAD PROXIMATE 75MM BLUE (ENDOMECHANICALS) ×4 IMPLANT
RELOAD PROXIMATE 75MM GREEN (ENDOMECHANICALS) IMPLANT
SCISSORS LAP 5X35 DISP (ENDOMECHANICALS) ×2 IMPLANT
SEPRAFILM PROCEDURAL PACK 3X5 (MISCELLANEOUS) IMPLANT
SET IRRIG TUBING LAPAROSCOPIC (IRRIGATION / IRRIGATOR) IMPLANT
SET TUBE SMOKE EVAC HIGH FLOW (TUBING) ×2 IMPLANT
SHEARS FOC LG CVD HARMONIC 17C (MISCELLANEOUS) ×2 IMPLANT
SLEEVE ENDOPATH XCEL 5M (ENDOMECHANICALS) ×2 IMPLANT
SLEEVE SUCTION 125 (MISCELLANEOUS) ×2 IMPLANT
SLEEVE SUCTION CATH 165 (SLEEVE) ×2 IMPLANT
SLEEVE SURGEON STRL (DRAPES) ×2 IMPLANT
SPONGE INTESTINAL PEANUT (DISPOSABLE) ×2 IMPLANT
SPONGE LAP 18X18 RF (DISPOSABLE) IMPLANT
SPONGE LAP 18X18 X RAY DECT (DISPOSABLE) ×24 IMPLANT
SPONGE SURGIFOAM ABS GEL 100 (HEMOSTASIS) IMPLANT
STAPLE ECHEON FLEX 60 POW ENDO (STAPLE) ×2 IMPLANT
STAPLER PROXIMATE 75MM BLUE (STAPLE) ×2 IMPLANT
STAPLER VISISTAT 35W (STAPLE) ×2 IMPLANT
STRIP PERI DRY VERITAS 60 (STAPLE) ×2 IMPLANT
SUCTION POOLE HANDLE (INSTRUMENTS) ×2
SUT 5.0 PDS RB-1 (SUTURE) ×5
SUT ETHILON 2 0 FS 18 (SUTURE) ×4 IMPLANT
SUT ETHILON 2 LR (SUTURE) IMPLANT
SUT MNCRL AB 4-0 PS2 18 (SUTURE) IMPLANT
SUT PDS AB 1 TP1 96 (SUTURE) ×6 IMPLANT
SUT PDS AB 3-0 SH 27 (SUTURE) ×8 IMPLANT
SUT PDS AB 4-0 RB1 27 (SUTURE) ×22 IMPLANT
SUT PDS PLUS AB 5-0 RB-1 (SUTURE) ×5 IMPLANT
SUT PROLENE 2 0 SH 30 (SUTURE) ×2 IMPLANT
SUT PROLENE 2 0 SH DA (SUTURE) ×2 IMPLANT
SUT PROLENE 3 0 SH 48 (SUTURE) ×8 IMPLANT
SUT PROLENE 4 0 RB 1 (SUTURE) ×2
SUT PROLENE 4-0 RB1 .5 CRCL 36 (SUTURE) ×2 IMPLANT
SUT PROLENE 5 0 RB 1 DA (SUTURE) ×4 IMPLANT
SUT SILK 2 0 SH CR/8 (SUTURE) ×4 IMPLANT
SUT SILK 2 0 TIES 10X30 (SUTURE) ×2 IMPLANT
SUT SILK 3 0 SH CR/8 (SUTURE) ×2 IMPLANT
SUT SILK 3 0 TIES 10X30 (SUTURE) ×2 IMPLANT
SUT VIC AB 2-0 CT1 27 (SUTURE)
SUT VIC AB 2-0 CT1 TAPERPNT 27 (SUTURE) IMPLANT
SUT VIC AB 2-0 SH 18 (SUTURE) ×2 IMPLANT
SUT VIC AB 3-0 SH 18 (SUTURE) ×2 IMPLANT
SUT VIC AB 3-0 SH 27 (SUTURE) ×1
SUT VIC AB 3-0 SH 27X BRD (SUTURE) ×1 IMPLANT
SUT VICRYL AB 2 0 TIES (SUTURE) IMPLANT
SYR BULB IRRIGATION 50ML (SYRINGE) ×2 IMPLANT
TAPE UMBILICAL 1/8 X36 TWILL (MISCELLANEOUS) IMPLANT
TOWEL GREEN STERILE (TOWEL DISPOSABLE) ×2 IMPLANT
TOWEL GREEN STERILE FF (TOWEL DISPOSABLE) ×2 IMPLANT
TRAY FOLEY MTR SLVR 14FR STAT (SET/KITS/TRAYS/PACK) ×2 IMPLANT
TRAY LAPAROSCOPIC MC (CUSTOM PROCEDURE TRAY) ×2 IMPLANT
TROCAR XCEL BLUNT TIP 100MML (ENDOMECHANICALS) IMPLANT
TROCAR XCEL NON-BLD 11X100MML (ENDOMECHANICALS) IMPLANT
TROCAR XCEL NON-BLD 5MMX100MML (ENDOMECHANICALS) ×2 IMPLANT
TUBE CONNECTING 12X1/4 (SUCTIONS) ×2 IMPLANT
TUBE FEEDING 8FR 16IN STR KANG (MISCELLANEOUS) ×2 IMPLANT
TUBE FEEDING ENTERAL 5FR 16IN (TUBING) ×2 IMPLANT
TUNNELER SHEATH ON-Q 16GX12 DP (PAIN MANAGEMENT) IMPLANT
YANKAUER SUCT BULB TIP NO VENT (SUCTIONS) ×2 IMPLANT

## 2019-01-15 NOTE — Anesthesia Procedure Notes (Signed)
Procedure Name: Intubation Date/Time: 01/15/2019 7:52 AM Performed by: Junie Bame, RN Pre-anesthesia Checklist: Patient identified, Emergency Drugs available, Suction available and Patient being monitored Patient Re-evaluated:Patient Re-evaluated prior to induction Oxygen Delivery Method: Circle system utilized Preoxygenation: Pre-oxygenation with 100% oxygen Induction Type: IV induction Ventilation: Mask ventilation without difficulty Laryngoscope Size: Miller and 2 Grade View: Grade I Tube type: Oral Tube size: 7.0 mm Number of attempts: 1 Airway Equipment and Method: Stylet Placement Confirmation: ETT inserted through vocal cords under direct vision,  positive ETCO2 and breath sounds checked- equal and bilateral Secured at: 21 cm Tube secured with: Tape Dental Injury: Teeth and Oropharynx as per pre-operative assessment

## 2019-01-15 NOTE — Anesthesia Procedure Notes (Signed)
Central Venous Catheter Insertion Performed by: Brennan Bailey, MD, anesthesiologist Start/End12/15/2020 3:00 PM, 01/15/2019 3:03 PM Patient location: OR. Preanesthetic checklist: patient identified, IV checked, site marked, risks and benefits discussed, surgical consent, monitors and equipment checked, pre-op evaluation, timeout performed and anesthesia consent Position: Trendelenburg Patient sedated Hand hygiene performed  and maximum sterile barriers used  Catheter size: 8 Fr Total catheter length 16. Central line was placed.Double lumen Procedure performed using ultrasound guided technique. Ultrasound Notes:anatomy identified, needle tip was noted to be adjacent to the nerve/plexus identified, no ultrasound evidence of intravascular and/or intraneural injection and image(s) printed for medical record Attempts: 1 Following insertion, dressing applied, line sutured and Biopatch. Post procedure assessment: blood return through all ports  Patient tolerated the procedure well with no immediate complications. Additional procedure comments: CVC placed for ongoing pressor requirement intraoperatively. Placed under sterile conditions.Marland Kitchen

## 2019-01-15 NOTE — Progress Notes (Signed)
Patient CBG 348. 11 units of insulin given. MD Kae Heller notified. Will recheck in CBG in 1 hr.

## 2019-01-15 NOTE — Anesthesia Postprocedure Evaluation (Signed)
Anesthesia Post Note  Patient: Leah Farmer  Procedure(s) Performed: WHIPPLE PROCEDURE (N/A Abdomen) LAPAROSCOPY DIAGNOSTIC (N/A Abdomen) DISTAL PANCREATECTOMY (N/A Abdomen)     Patient location during evaluation: PACU Anesthesia Type: General Level of consciousness: awake and alert Pain management: pain level controlled Vital Signs Assessment: post-procedure vital signs reviewed and stable Respiratory status: spontaneous breathing, nonlabored ventilation and respiratory function stable Cardiovascular status: stable (on phenylephrine) Postop Assessment: no apparent nausea or vomiting Anesthetic complications: no    Last Vitals:  Vitals:   01/15/19 1544 01/15/19 1558  BP:  104/63  Pulse: (!) 113 (!) 112  Resp: 17 14  Temp:    SpO2: 100% 100%    Last Pain:  Vitals:   01/15/19 1543  TempSrc:   PainSc: [place]

## 2019-01-15 NOTE — Transfer of Care (Signed)
Immediate Anesthesia Transfer of Care Note  Patient: Leah Farmer  Procedure(s) Performed: WHIPPLE PROCEDURE (N/A Abdomen) LAPAROSCOPY DIAGNOSTIC (N/A Abdomen) DISTAL PANCREATECTOMY (N/A Abdomen)  Patient Location: PACU  Anesthesia Type:General  Level of Consciousness: drowsy and patient cooperative  Airway & Oxygen Therapy: Patient Spontanous Breathing and Patient connected to face mask oxygen  Post-op Assessment: Report given to RN and Post -op Vital signs reviewed and stable  Post vital signs: Reviewed and stable  Last Vitals:  Vitals Value Taken Time  BP 109/64 01/15/19 1543  Temp    Pulse 113 01/15/19 1544  Resp 17 01/15/19 1544  SpO2 100 % 01/15/19 1544  Vitals shown include unvalidated device data.  Last Pain:  Vitals:   01/15/19 0612  TempSrc:   PainSc: 0-No pain         Complications: No apparent anesthesia complications

## 2019-01-15 NOTE — Op Note (Signed)
PREOPERATIVE DIAGNOSIS: primary malignant neuroendocrine tumor of the pancreas x 2.    POSTOPERATIVE DIAGNOSIS: Same.   PROCEDURES PERFORMED:  Diagnostic laparoscopy Classic pancreaticoduodenectomy  Distal pancreatectomy  Placement of biliary and pancreatic duct stent   SURGEON: Stark Klein, MD   ASSISTANT: Fanny Skates, MD Judyann Munson, RNFA   ANESTHESIA: General and epidural   FINDINGS: 2.5 cm pancreatic uncinate mass. 1.5-2 cm distal pancreatic mass, soft pancreatic tissue. 8 mm common bile duct. 2 mm pancreatic duct  SPECIMENS:  1. Pancreaticoduodenectomy 2. portal nodes  3.  Distal pancreas  ESTIMATED BLOOD LOSS: 800 mL.   COMPLICATIONS: None known.   PROCEDURE:   Pt was identified in the holding area and taken to  the operating room, and placed supine on the operating room  table. General anesthesia was induced. The patient's abdomen was  prepped and draped in a sterile fashion, after a Foley catheter was  placed. A time-out was performed according to the surgical safety check  list. When all was correct we continued.   The patient was placed in reverse trendelenburg position and rotated to the right.  The left subcostal margin was anesthetized with local anesthesia.  A 5 mm optiview trocar was placed under direct visualization.  The abdomen was insufflated with carbon dioxide.  The abdomen was examined.  A second port was placed in the left mid abdomen.  A few nodules on the diaphragm were seen.  These were biopsied with the laparoscopic biopsy forceps and sent for frozen.  Laparoscopic scissors were used to take down the abdominal wall adhesions sharply.  The frozen section on the nodules was negative.    A chevron incision was made as the patient had a generous right subcostal incision already.  The subcutaneous tissues were divided with the Bovie cautery. The peritoneum was entered in the center of the abdomen. Digital retraction was then used to elevate the  preperitoneal fat, and this was taken with the cautery as well. The subcutaneous tissues and fascia of the muscular layers were taken laterally with the cautery.  Care was taken to protect the underlying viscera.  Abdominal wall adhesions were taken.    The Bookwalter self-retaining retractor was placed to assist with visualization. The right colon was taken down off of the white line of Toldt and from the retroperitoneum at the hepatic flexure. The omental adhesions were taken down off the liver.  The falciform was taken down with the harmonic.  This was tied with a 2-0 silk and used to retract it superiorly.  The porta was identified. The duodenum was kocherized extensively with blunt dissection and with cautery. The lesser sac was entered and opened up proximally and distally. The distal pancreas was palpated and the additional neuroendocrine tumor was palpated in the distal pancreas.     The common bile duct was skeletonized near the duodenum. A vessel loop was passed around it. The gastroduodenal artery, as well as the common hepatic artery were skeletonized. The proper hepatic artery was traced out to make sure that flow was going to both sides of the liver when the GDA was clamped. The GDA was test clamped with the bulldog, with good flow to the liver and no signs of ischemia. This was divided with 2-0 silk ties and then clipped. The proper hepatic artery was reflected upward, and the anterior portal vein was exposed. A Kelly clamp was passed underneath the pancreas at the superior mesenteric vein, and this passed easily with no signs of tumor involvement.  Several nodes were located in the porta hepatis.  These were grasped with long allis clamps and dissected away with the harmonic scalpel.    Attention was then directed to the stomach, and the omentum was taken  off of the stomach at the border of the antrum and the body. The  gastrohepatic ligament was taken down with the harmonic, and care was   taken to make sure there was not a replaced left hepatic artery in this  location. The stomach was divided with the GIA-75 stapler. The border  of the stomach was oversewn with a 3-0 running PDS suture.   Attention was then directed to the small bowel. Around 10 cm past the  ligament of Treitz was located, and the bowel was divided with the 75-GIA.  The distal portion of the jejunum was also oversewn with a 3-0 PDS  suture. The fourth portion of the duodenum was skeletonized with the  harmonic scalpel, taking down all of the mesenteric vessels. The  ligament of Treitz was taken down. The IMV was preserved.  The duodenum was then passed underneath the portal vein.    2-0 silk sutures were tied down and the inferior and superior border of the pancreas.  The bovie was used to divide the pancreas over the portal vein.   The Bovie was used to coagulate the small bleeders at the border of the pancreas.  The Overholt in combination with the harmonic and locking Weck clips  were then used to take the uncinate process off of the portal vein and  the superior mesenteric artery. Care was taken not to incorporate the  superior mesenteric artery in the dissection. The specimen was then marked and passed off the table for frozen section margin.   Attention was then directed to the distal pancreas.  The tumor was palpable in the far distal pancreas nestled in the lower border of the splenic hilum.  The short gastrics were taken down with the harmonic.  The inferior border of the pancreas was identified and opened up.  The pancreas was dissected from proximal to distal inferiorly.  The tip was identified and retracted downward.  The pancreas was dissected away from the splenic vessels.  The echelon stapler with blue load and peristrips was used to divide the pancreas.  The pancreas despite being soft, did crack at the end.  2-0 prolene was used to oversew the end of the pancreas in running locked mattress  fashion to compress the end.    At this point the frozens returned back as  all negative.The jejunum was then passed underneath the SMV in order to get appropriate lie for the pancreatic and biliary anastomoses. The jejunum was opened around 10 mm at the appropriate location for a hepaticjejunostomy.  Twelve interrupted sutures were used to create the hepaticojejunostomy.  An 8 Fr pediatric feeding tube segment was used to create a biliary stent.     The pancreatic anastomosis was then created. The pancreas was very soft, and the duct was 1-2 mm. A pediatric feeding tube was used as a pancreatic stent. The posterior layer was formed first with 2-0 silk sutures in interrupted fashion. Four interrupted 5-0 PDS sutures were used to create a duct-to-mucosa anastamosis.  The anterior layer was then oversewn with 2-0 silks to dunk the pancreatic parenchyma.   The more distal portion of the jejunum was pulled up over the colon, and two 3-0 silks were placed through the posterior border of the stomach for the  gastrojejunostomy. The stomach and the small bowel were opened, and a GIA-75 was used to create an end-to-end anastomosis. The open areas of the staple line were examined to ensure  that there was hemostasis. The defect was then closed with a single  layer of running Connell suture of 3-0 PDS. Prior to a complete  closure, the NG tube was passed toward the efferent limb.   The areas were then irrigated and then those anastomoses were covered  With Vistaseal. This was also placed over the tail of the pancreas.  This was allowed to dry. The abdomen was then irrigated again and all the laparotomy sponges were removed. A lap count was performed, which was correct. Two 19-Blake drains were placed in the right abdomen with the lateral-most drain placed behind the choledochojejunostomy. The medial Blake drain was placed just anterior and slightly superior to the pancreaticojejunostomy.  The lower left port site  was used for a 19 Fr blake drain to be placed near the pancreatic stump.     The fascia was then closed with #1 looped running PDS sutures. Two layers were used on the left subcostal portion of the incision and one layer on the right as it was a redo incision.  The skin was irrigated and then closed with staples. The wounds were cleaned, dried and dressed with a sterile dressings.   The patient tolerated the procedure well and was extubated and taken to  PACU in stable condition. Needle and sponge counts were correct x2.

## 2019-01-15 NOTE — Interval H&P Note (Signed)
History and Physical Interval Note:  01/15/2019 7:29 AM  Leah Farmer  has presented today for surgery, with the diagnosis of MALIGNANT NEUROENDOCRINE NEOPLASM OF PANCREAS.  The various methods of treatment have been discussed with the patient and family. After consideration of risks, benefits and other options for treatment, the patient has consented to  Procedure(s): WHIPPLE PROCEDURE (N/A) LAPAROSCOPY DIAGNOSTIC (N/A) as a surgical intervention.  The patient's history has been reviewed, patient examined, no change in status, stable for surgery.  I have reviewed the patient's chart and labs.  Questions were answered to the patient's satisfaction.     Stark Klein

## 2019-01-15 NOTE — Anesthesia Procedure Notes (Signed)
Epidural Patient location during procedure: pre-op Start time: 01/15/2019 7:18 AM End time: 01/15/2019 7:21 AM  Staffing Anesthesiologist: Brennan Bailey, MD Performed: anesthesiologist   Preanesthetic Checklist Completed: patient identified, IV checked, risks and benefits discussed, monitors and equipment checked, pre-op evaluation and timeout performed  Epidural Patient position: sitting Prep: DuraPrep and site prepped and draped Patient monitoring: continuous pulse ox, blood pressure and heart rate Approach: right paramedian Location: thoracic (1-12) Injection technique: LOR air  Needle:  Needle type: Tuohy  Needle gauge: 17 G Needle length: 9 cm Needle insertion depth: 7 cm Catheter type: closed end flexible Catheter size: 19 Gauge Catheter at skin depth: 13 cm Test dose: negative and 1.5% lidocaine with Epi 1:200 K  Assessment Events: blood not aspirated, injection not painful, no injection resistance, no paresthesia and negative IV test  Additional Notes Patient identified. Risks, benefits, and alternatives discussed with patient including but not limited to bleeding, infection, nerve damage, paralysis, failed block, incomplete pain control, headache, blood pressure changes, nausea, vomiting, reactions to medication, and back pain. Confirmed the patient's most recent platelet count. Confirmed with patient that they are not currently taking any anticoagulation, have any bleeding history, or any family history of bleeding disorders. Patient expressed understanding and wished to proceed. All questions were answered. Sterile technique was used throughout the entire procedure. Please see notes for vital signs.   First attempt midline unable to access interspinous space, second attempt right paramedian with crisp LOR. Test dose was given through epidural catheter and negative prior to continuing to dose epidural or start infusion. Warning signs of high block given to the patient  including shortness of breath, tingling/numbness in hands, complete motor block, or any concerning symptoms with instructions to call for help. All questions and concerns addressed.  Reason for block:post-op pain management

## 2019-01-15 NOTE — Anesthesia Procedure Notes (Signed)
Arterial Line Insertion Start/End12/15/2020 6:55 AM, 01/15/2019 7:13 AM Performed by: Brennan Bailey, MD, anesthesiologist  Patient location: Pre-op. Preanesthetic checklist: patient identified, IV checked, site marked, risks and benefits discussed, surgical consent, monitors and equipment checked, pre-op evaluation, timeout performed and anesthesia consent Lidocaine 1% used for infiltration and patient sedated Right, radial was placed Catheter size: 20 G Hand hygiene performed  and maximum sterile barriers used   Attempts: 3 Procedure performed using ultrasound guided technique. Ultrasound Notes:anatomy identified, needle tip was noted to be adjacent to the nerve/plexus identified and no ultrasound evidence of intravascular and/or intraneural injection Following insertion, Biopatch. Post procedure assessment: normal  Patient tolerated the procedure well with no immediate complications. Additional procedure comments: Attempted by SRNA and CRNA, placed by Dr. Daiva Huge with ultrasound.

## 2019-01-16 ENCOUNTER — Encounter (HOSPITAL_COMMUNITY): Payer: Self-pay | Admitting: Anesthesiology

## 2019-01-16 LAB — COMPREHENSIVE METABOLIC PANEL
ALT: 91 U/L — ABNORMAL HIGH (ref 0–44)
AST: 173 U/L — ABNORMAL HIGH (ref 15–41)
Albumin: 3.4 g/dL — ABNORMAL LOW (ref 3.5–5.0)
Alkaline Phosphatase: 28 U/L — ABNORMAL LOW (ref 38–126)
Anion gap: 17 — ABNORMAL HIGH (ref 5–15)
BUN: 26 mg/dL — ABNORMAL HIGH (ref 6–20)
CO2: 12 mmol/L — ABNORMAL LOW (ref 22–32)
Calcium: 7.4 mg/dL — ABNORMAL LOW (ref 8.9–10.3)
Chloride: 110 mmol/L (ref 98–111)
Creatinine, Ser: 1.55 mg/dL — ABNORMAL HIGH (ref 0.44–1.00)
GFR calc Af Amer: 44 mL/min — ABNORMAL LOW (ref >60)
GFR calc non Af Amer: 38 mL/min — ABNORMAL LOW (ref >60)
Glucose, Bld: 226 mg/dL — ABNORMAL HIGH (ref 70–99)
Potassium: 3.9 mmol/L (ref 3.5–5.1)
Sodium: 139 mmol/L (ref 135–145)
Total Bilirubin: 0.3 mg/dL (ref 0.3–1.2)
Total Protein: 5.2 g/dL — ABNORMAL LOW (ref 6.5–8.1)

## 2019-01-16 LAB — PHOSPHORUS: Phosphorus: 1.6 mg/dL — ABNORMAL LOW (ref 2.5–4.6)

## 2019-01-16 LAB — PROTIME-INR
INR: 1.3 — ABNORMAL HIGH (ref 0.8–1.2)
Prothrombin Time: 16.2 seconds — ABNORMAL HIGH (ref 11.4–15.2)

## 2019-01-16 LAB — GLUCOSE, CAPILLARY
Glucose-Capillary: 290 mg/dL — ABNORMAL HIGH (ref 70–99)
Glucose-Capillary: 270 mg/dL — ABNORMAL HIGH (ref 70–99)
Glucose-Capillary: 209 mg/dL — ABNORMAL HIGH (ref 70–99)
Glucose-Capillary: 191 mg/dL — ABNORMAL HIGH (ref 70–99)
Glucose-Capillary: 220 mg/dL — ABNORMAL HIGH (ref 70–99)
Glucose-Capillary: 211 mg/dL — ABNORMAL HIGH (ref 70–99)
Glucose-Capillary: 177 mg/dL — ABNORMAL HIGH (ref 70–99)

## 2019-01-16 LAB — CBC
HCT: 23.2 % — ABNORMAL LOW (ref 36.0–46.0)
Hemoglobin: 7.3 g/dL — ABNORMAL LOW (ref 12.0–15.0)
MCH: 29.3 pg (ref 26.0–34.0)
MCHC: 31.5 g/dL (ref 30.0–36.0)
MCV: 93.2 fL (ref 80.0–100.0)
Platelets: 255 10*3/uL (ref 150–400)
RBC: 2.49 MIL/uL — ABNORMAL LOW (ref 3.87–5.11)
RDW: 13.4 % (ref 11.5–15.5)
WBC: 16.1 10*3/uL — ABNORMAL HIGH (ref 4.0–10.5)
nRBC: 0 % (ref 0.0–0.2)

## 2019-01-16 LAB — MAGNESIUM: Magnesium: 1.3 mg/dL — ABNORMAL LOW (ref 1.7–2.4)

## 2019-01-16 LAB — BETA-HYDROXYBUTYRIC ACID: Beta-Hydroxybutyric Acid: 0.27 mmol/L (ref 0.05–0.27)

## 2019-01-16 MED ORDER — SODIUM CHLORIDE 0.9 % IV BOLUS
1000.0000 mL | Freq: Once | INTRAVENOUS | Status: AC
  Administered 2019-01-16: 1000 mL via INTRAVENOUS

## 2019-01-16 MED ORDER — SODIUM CHLORIDE 0.9 % IV SOLN
INTRAVENOUS | Status: DC | PRN
  Administered 2019-01-16: 250 mL via INTRAVENOUS

## 2019-01-16 MED ORDER — MAGNESIUM SULFATE 4 GM/100ML IV SOLN
4.0000 g | Freq: Once | INTRAVENOUS | Status: AC
  Administered 2019-01-16: 4 g via INTRAVENOUS
  Filled 2019-01-16: qty 100

## 2019-01-16 MED ORDER — SODIUM PHOSPHATES 45 MMOLE/15ML IV SOLN
30.0000 mmol | Freq: Once | INTRAVENOUS | Status: AC
  Administered 2019-01-16: 30 mmol via INTRAVENOUS
  Filled 2019-01-16: qty 10

## 2019-01-16 MED ORDER — INSULIN GLARGINE 100 UNIT/ML ~~LOC~~ SOLN
5.0000 [IU] | Freq: Every day | SUBCUTANEOUS | Status: DC
  Administered 2019-01-16: 5 [IU] via SUBCUTANEOUS
  Filled 2019-01-16 (×2): qty 0.05

## 2019-01-16 NOTE — Progress Notes (Signed)
Inpatient Diabetes Program Recommendations  AACE/ADA: New Consensus Statement on Inpatient Glycemic Control (2015)  Target Ranges:  Prepandial:   less than 140 mg/dL      Peak postprandial:   less than 180 mg/dL (1-2 hours)      Critically ill patients:  140 - 180 mg/dL   Lab Results  Component Value Date   GLUCAP 209 (H) 01/16/2019   HGBA1C 7.5 (H) 01/11/2019    Review of Glycemic Control Results for Leah Farmer, Leah Farmer (MRN [id number]) as of 01/16/2019 09:35  Ref. Range 01/15/2019 22:00 01/15/2019 23:46 01/16/2019 01:32 01/16/2019 03:44 01/16/2019 08:02  Glucose-Capillary Latest Ref Range: 70 - 99 mg/dL 356 (H) 373 (H) 290 (H) 270 (H) 209 (H)  Results for Leah Farmer, Leah Farmer (MRN [id number]) as of 01/16/2019 09:35  Ref. Range 01/16/2019 05:00  CO2 Latest Ref Range: 22 - 32 mmol/L 12 (L)  Results for Leah Farmer, Leah Farmer (MRN [id number]) as of 01/16/2019 09:35  Ref. Range 01/16/2019 05:00  Anion gap Latest Ref Range: 5 - 15  17 (H)   Diabetes history: Type 2 DM  Outpatient Diabetes medications: Janumet [phone] mg QAM Current orders for Inpatient glycemic control: Novolog 0-20 units Q4H Decadron 5 mg x 1  Inpatient Diabetes Program Recommendations:    Noted patient s/p Whipple procedure and change in Co2 and Anion Gap; suggestive of DKA. Question if patient now type 1 DM given removal of portion of pancreas.  Consider: -Starting IV insulin per Endotool for DKA - Beta Hydroxybutyric acid - Medical consult to assist in diabetes management.   @1022  Discussed with Dr Barry Dienes. Order placed for beta hydroxybutyric acid. Pending results will plan to start IV insulin and Md will contact medical team.   Thanks, Bronson Curb, MSN, RNC-OB Diabetes Coordinator [phone] (8a-5p)

## 2019-01-16 NOTE — Anesthesia Post-op Follow-up Note (Signed)
  Anesthesia Pain Follow-up Note  Patient: Leah Farmer  Day #: 1  Date of Follow-up: 01/16/2019 Time: 12:07 PM  Last Vitals:  Vitals:   01/16/19 1130 01/16/19 1158  BP: 108/65   Pulse: (!) 115   Resp: 20 (!) 22  Temp:    SpO2: 96% 94%    Level of Consciousness: alert  Pain: mild   Side Effects:Nausea and Vomiting  Catheter Site Exam:clean, dry  Epidural / Intrathecal (From admission, onward)   Start     Dose/Rate Route Frequency Ordered Stop   01/15/19 1845  ropivacaine (PF) 2 mg/mL (0.2%) (NAROPIN) injection     10 mL/hr 10 mL/hr  Epidural Continuous 01/15/19 1837         Plan: Continue current therapy of postop epidural at surgeon's request  Tiajuana Amass

## 2019-01-16 NOTE — Progress Notes (Signed)
1 Day Post-Op   Subjective/Chief Complaint: No significant pain.  No n/v.     Objective: Vital signs in last 24 hours: Temp:  [97.8 F (36.6 C)-100.2 F (37.9 C)] 98.4 F (36.9 C) (12/16 0800) Pulse Rate:  [108-129] 118 (12/16 0800) Resp:  [0-26] 12 (12/16 0800) BP: (78-127)/(40-72) 104/66 (12/16 0800) SpO2:  [94 %-100 %] 98 % (12/16 0800) Arterial Line BP: (61-128)/(43-87) 78/75 (12/16 0800)    Intake/Output from previous day: 12/15 0701 - 12/16 0700 In: 8982.1 [I.V.:5519.3; IV Piggyback:3316.4] Out: 2793 [Urine:895; Drains:548; Blood:1050] Intake/Output this shift: Total I/O In: 338.4 [I.V.:240.3; Other:21.2; IV Piggyback:76.9] Out: -   General appearance: alert, pale, looks uncomfortable. Resp: breathing comfortably Cardio: tachycardic but regular. GI: soft, non distended.  all three drains look like old blood.  Extremities: extremities normal, atraumatic, no cyanosis or edema  Lab Results:  Recent Labs    01/15/19 1622 01/16/19 0500  WBC 23.0* 16.1*  HGB 8.5* 7.3*  HCT 28.2* 23.2*  PLT 331 255   BMET Recent Labs    01/15/19 1622 01/16/19 0500  NA 137 139  K 4.9 3.9  CL 105 110  CO2 11* 12*  GLUCOSE 340* 226*  BUN 23* 26*  CREATININE 1.47* 1.55*  CALCIUM 7.5* 7.4*   PT/INR Recent Labs    01/16/19 0610  LABPROT 16.2*  INR 1.3*   ABG Recent Labs    01/15/19 1347 01/15/19 1630  PHART 7.273* 7.177*  HCO3 17.1* 11.3*    Studies/Results: DG CHEST PORT 1 VIEW  Result Date: 01/15/2019 CLINICAL DATA:  Central venous line insertion EXAM: PORTABLE CHEST 1 VIEW COMPARISON:  Radiograph 07/08/2018 FINDINGS: A right IJ catheter tip terminates at the superior cavoatrial junction. A transesophageal tube tip and side port terminate below the level of the imaging. A epidural pain catheter curls over the upper chest terminating midline in the lower thoracic levels. Multiple surgical drains project over the upper abdomen. Low lung volumes with basilar  atelectasis. No consolidation, features of edema, pneumothorax, or effusion. Cardiomediastinal contours likely accentuated by portable technique. No acute osseous abnormality. Mild degenerative changes in the spine and shoulders. IMPRESSION: 1. Right IJ catheter tip terminates at the superior cavoatrial junction. 2. An epidural pain catheter terminates in the midline at the lower thoracic levels. 3. Transesophageal tube tip is below the level of imaging, beyond the GE junction. 4. Atelectatic changes in the lungs. No acute cardiopulmonary abnormality. Electronically Signed   By: Lovena Le M.D.   On: 01/15/2019 16:23    Anti-infectives: Anti-infectives (From admission, onward)   Start     Dose/Rate Route Frequency Ordered Stop   01/15/19 2000  ceFAZolin (ANCEF) IVPB 2g/100 mL premix     2 g 200 mL/hr over 30 Minutes Intravenous Every 8 hours 01/15/19 1712 01/15/19 2052   01/15/19 0600  ceFAZolin (ANCEF) IVPB 2g/100 mL premix     2 g 200 mL/hr over 30 Minutes Intravenous On call to O.R. 01/15/19 0549 01/15/19 1145      Assessment/Plan: s/p Procedure(s): WHIPPLE PROCEDURE (N/A) LAPAROSCOPY DIAGNOSTIC (N/A) DISTAL PANCREATECTOMY (N/A) Neuroendocrine cancer of the pancreas.  Await pathology.  Multifocal.    ABL anemia - if HCT drops more or cr goes up more, will need blood DM - may need insulin gtt. Acidosis - likely related to blood loss/underresuscitation AKI - increase IVF Hypomagnesemia and hypophosphatemia - replete.   Elevated LFTs - likely related to whipple.  Will follow.   D/C a line Epidural/PCA for pain control in  addition to 24h ofirmev and PRN robaxin.     LOS: 1 day    Stark Klein 01/16/2019

## 2019-01-16 NOTE — Progress Notes (Addendum)
PT Cancellation Note  Patient Details Name: Cade Waldroop MRN: [id number] DOB: [birth year]   Cancelled Treatment:    Reason Eval/Treat Not Completed: Medical issues which prohibited therapy;Patient not medically ready.  RN asks for HOLD today.  Epidural still in.  Will see 12/17 as able. 01/16/2019  Ginger Carne., PT Acute Rehabilitation Services [phone]  (pager) [phone]  (office)   Tessie Fass Brittnye Josephs 01/16/2019, 1:02 PM

## 2019-01-17 LAB — MAGNESIUM: Magnesium: 2.5 mg/dL — ABNORMAL HIGH (ref 1.7–2.4)

## 2019-01-17 LAB — COMPREHENSIVE METABOLIC PANEL
ALT: 82 U/L — ABNORMAL HIGH (ref 0–44)
AST: 117 U/L — ABNORMAL HIGH (ref 15–41)
Albumin: 3.6 g/dL (ref 3.5–5.0)
Alkaline Phosphatase: 34 U/L — ABNORMAL LOW (ref 38–126)
Anion gap: 11 (ref 5–15)
BUN: 30 mg/dL — ABNORMAL HIGH (ref 6–20)
CO2: 17 mmol/L — ABNORMAL LOW (ref 22–32)
Calcium: 7 mg/dL — ABNORMAL LOW (ref 8.9–10.3)
Chloride: 109 mmol/L (ref 98–111)
Creatinine, Ser: 1.14 mg/dL — ABNORMAL HIGH (ref 0.44–1.00)
GFR calc Af Amer: >60 mL/min (ref >60)
GFR calc non Af Amer: 55 mL/min — ABNORMAL LOW (ref >60)
Glucose, Bld: 237 mg/dL — ABNORMAL HIGH (ref 70–99)
Potassium: 3.8 mmol/L (ref 3.5–5.1)
Sodium: 137 mmol/L (ref 135–145)
Total Bilirubin: 0.6 mg/dL (ref 0.3–1.2)
Total Protein: 5.7 g/dL — ABNORMAL LOW (ref 6.5–8.1)

## 2019-01-17 LAB — GLUCOSE, CAPILLARY
Glucose-Capillary: 222 mg/dL — ABNORMAL HIGH (ref 70–99)
Glucose-Capillary: 235 mg/dL — ABNORMAL HIGH (ref 70–99)
Glucose-Capillary: 225 mg/dL — ABNORMAL HIGH (ref 70–99)
Glucose-Capillary: 209 mg/dL — ABNORMAL HIGH (ref 70–99)
Glucose-Capillary: 198 mg/dL — ABNORMAL HIGH (ref 70–99)
Glucose-Capillary: 184 mg/dL — ABNORMAL HIGH (ref 70–99)

## 2019-01-17 LAB — CBC
HCT: 18.5 % — ABNORMAL LOW (ref 36.0–46.0)
Hemoglobin: 6 g/dL — CL (ref 12.0–15.0)
MCH: 29.9 pg (ref 26.0–34.0)
MCHC: 32.4 g/dL (ref 30.0–36.0)
MCV: 92 fL (ref 80.0–100.0)
Platelets: 170 10*3/uL (ref 150–400)
RBC: 2.01 MIL/uL — ABNORMAL LOW (ref 3.87–5.11)
RDW: 14 % (ref 11.5–15.5)
WBC: 22.5 10*3/uL — ABNORMAL HIGH (ref 4.0–10.5)
nRBC: 0.1 % (ref 0.0–0.2)

## 2019-01-17 LAB — PHOSPHORUS: Phosphorus: 3 mg/dL (ref 2.5–4.6)

## 2019-01-17 MED ORDER — SODIUM CHLORIDE 0.9% IV SOLUTION: Freq: Once | INTRAVENOUS | Status: AC

## 2019-01-17 MED ORDER — INSULIN GLARGINE 100 UNIT/ML ~~LOC~~ SOLN
8.0000 [IU] | Freq: Every day | SUBCUTANEOUS | Status: DC
  Administered 2019-01-17: 8 [IU] via SUBCUTANEOUS
  Filled 2019-01-17 (×2): qty 0.08

## 2019-01-17 NOTE — Progress Notes (Signed)
CRITICAL VALUE ALERT  Critical Value:  Hgb 6.0  Date & Time Notied:  01/17/2019 05:15   Provider Notified: Dr. Dema Severin  Orders Received/Actions taken: Transfuse 1 unit RBC

## 2019-01-17 NOTE — Anesthesia Post-op Follow-up Note (Signed)
  Anesthesia Pain Follow-up Note  Patient: Leah Farmer  Day #: 2  Date of Follow-up: 01/17/2019 Time: 9:11 AM  Last Vitals:  Vitals:   01/17/19 0800 01/17/19 0900  BP: 130/60 120/69  Pulse: (!) 130 (!) 129  Resp: (!) 26 (!) 32  Temp:    SpO2: 92% 93%    Level of Consciousness: alert  Pain: moderate   Side Effects:None  Catheter Site Exam:clean, dry, no drainage  Epidural / Intrathecal (From admission, onward)   Start     Dose/Rate Route Frequency Ordered Stop   01/15/19 1845  ropivacaine (PF) 2 mg/mL (0.2%) (NAROPIN) injection     10 mL/hr 10 mL/hr  Epidural Continuous 01/15/19 1837         Plan: Modify therapy to improve pain control at surgeon's request. I increased the infusion rate from 10 to 62ml/hr. Pt was having moderate pain at the incision site but had numbness to cold at T9-10.  Dynastee Brummell,W. EDMOND

## 2019-01-17 NOTE — Progress Notes (Signed)
2 Days Post-Op   Subjective/Chief Complaint: No significant pain.  Occasional mild nausea when moving.  hbg 6 this AM.  Getting blood.       Objective: Vital signs in last 24 hours: Temp:  [98.1 F (36.7 C)-99.6 F (37.6 C)] 98.4 F (36.9 C) (12/17 1215) Pulse Rate:  [110-132] 128 (12/17 1215) Resp:  [13-32] 27 (12/17 1215) BP: (93-139)/(55-72) 133/71 (12/17 1215) SpO2:  [91 %-97 %] 95 % (12/17 1215)    Intake/Output from previous day: 12/16 0701 - 12/17 0700 In: 1465.5 [I.V.:494; IV Piggyback:790.4] Out: 1475 [Urine:1050; Drains:425] Intake/Output this shift: Total I/O In: 945.7 [I.V.:74.5; Blood:641.5; Other:80; IV Piggyback:149.7] Out: 175 [Urine:175]  General appearance: alert, pale Resp: breathing comfortably Cardio: tachycardic but regular. GI: soft, non distended.  all three drains look like old blood.  Extremities: extremities normal, atraumatic, no cyanosis or edema  Lab Results:  Recent Labs    01/16/19 0500 01/17/19 0440  WBC 16.1* 22.5*  HGB 7.3* 6.0*  HCT 23.2* 18.5*  PLT 255 170   BMET Recent Labs    01/16/19 0500 01/17/19 0440  NA 139 137  K 3.9 3.8  CL 110 109  CO2 12* 17*  GLUCOSE 226* 237*  BUN 26* 30*  CREATININE 1.55* 1.14*  CALCIUM 7.4* 7.0*   PT/INR Recent Labs    01/16/19 0610  LABPROT 16.2*  INR 1.3*   ABG Recent Labs    01/15/19 1347 01/15/19 1630  PHART 7.273* 7.177*  HCO3 17.1* 11.3*    Studies/Results: DG CHEST PORT 1 VIEW  Result Date: 01/15/2019 CLINICAL DATA:  Central venous line insertion EXAM: PORTABLE CHEST 1 VIEW COMPARISON:  Radiograph 07/08/2018 FINDINGS: A right IJ catheter tip terminates at the superior cavoatrial junction. A transesophageal tube tip and side port terminate below the level of the imaging. A epidural pain catheter curls over the upper chest terminating midline in the lower thoracic levels. Multiple surgical drains project over the upper abdomen. Low lung volumes with basilar  atelectasis. No consolidation, features of edema, pneumothorax, or effusion. Cardiomediastinal contours likely accentuated by portable technique. No acute osseous abnormality. Mild degenerative changes in the spine and shoulders. IMPRESSION: 1. Right IJ catheter tip terminates at the superior cavoatrial junction. 2. An epidural pain catheter terminates in the midline at the lower thoracic levels. 3. Transesophageal tube tip is below the level of imaging, beyond the GE junction. 4. Atelectatic changes in the lungs. No acute cardiopulmonary abnormality. Electronically Signed   By: Lovena Le M.D.   On: 01/15/2019 16:23    Anti-infectives: Anti-infectives (From admission, onward)   Start     Dose/Rate Route Frequency Ordered Stop   01/15/19 2000  ceFAZolin (ANCEF) IVPB 2g/100 mL premix     2 g 200 mL/hr over 30 Minutes Intravenous Every 8 hours 01/15/19 1712 01/15/19 2052   01/15/19 0600  ceFAZolin (ANCEF) IVPB 2g/100 mL premix     2 g 200 mL/hr over 30 Minutes Intravenous On call to O.R. 01/15/19 0549 01/15/19 1145      Assessment/Plan: s/p Procedure(s): WHIPPLE PROCEDURE (N/A) LAPAROSCOPY DIAGNOSTIC (N/A) DISTAL PANCREATECTOMY (N/A) Neuroendocrine cancer of the pancreas.  Await pathology.  Multifocal.    ABL anemia -getting a unit of blood today. DM - increase lantus Acidosis - was likely related to blood loss/underresuscitation.  CO2 comingi up.   AKI - increased IVF yesterday.  Cr coming down.   Hypomagnesemia and hypophosphatemia - repleted, better today.   Elevated LFTs - improving  Off neo.  Epidural/PCA for pain control, PRN robaxin.    D/C NGT today.  Sips of clears today.     LOS: 2 days    Stark Klein 01/17/2019

## 2019-01-18 LAB — COMPREHENSIVE METABOLIC PANEL
ALT: 58 U/L — ABNORMAL HIGH (ref 0–44)
AST: 43 U/L — ABNORMAL HIGH (ref 15–41)
Albumin: 3.2 g/dL — ABNORMAL LOW (ref 3.5–5.0)
Alkaline Phosphatase: 44 U/L (ref 38–126)
Anion gap: 12 (ref 5–15)
BUN: 26 mg/dL — ABNORMAL HIGH (ref 6–20)
CO2: 19 mmol/L — ABNORMAL LOW (ref 22–32)
Calcium: 7.3 mg/dL — ABNORMAL LOW (ref 8.9–10.3)
Chloride: 112 mmol/L — ABNORMAL HIGH (ref 98–111)
Creatinine, Ser: 1.03 mg/dL — ABNORMAL HIGH (ref 0.44–1.00)
GFR calc Af Amer: >60 mL/min (ref >60)
GFR calc non Af Amer: >60 mL/min (ref >60)
Glucose, Bld: 214 mg/dL — ABNORMAL HIGH (ref 70–99)
Potassium: 4 mmol/L (ref 3.5–5.1)
Sodium: 143 mmol/L (ref 135–145)
Total Bilirubin: 0.7 mg/dL (ref 0.3–1.2)
Total Protein: 5.9 g/dL — ABNORMAL LOW (ref 6.5–8.1)

## 2019-01-18 LAB — GLUCOSE, CAPILLARY
Glucose-Capillary: 196 mg/dL — ABNORMAL HIGH (ref 70–99)
Glucose-Capillary: 222 mg/dL — ABNORMAL HIGH (ref 70–99)
Glucose-Capillary: 240 mg/dL — ABNORMAL HIGH (ref 70–99)
Glucose-Capillary: 276 mg/dL — ABNORMAL HIGH (ref 70–99)
Glucose-Capillary: 294 mg/dL — ABNORMAL HIGH (ref 70–99)
Glucose-Capillary: 223 mg/dL — ABNORMAL HIGH (ref 70–99)

## 2019-01-18 LAB — CBC
HCT: 21.6 % — ABNORMAL LOW (ref 36.0–46.0)
Hemoglobin: 6.9 g/dL — CL (ref 12.0–15.0)
MCH: 28.9 pg (ref 26.0–34.0)
MCHC: 31.9 g/dL (ref 30.0–36.0)
MCV: 90.4 fL (ref 80.0–100.0)
Platelets: 165 10*3/uL (ref 150–400)
RBC: 2.39 MIL/uL — ABNORMAL LOW (ref 3.87–5.11)
RDW: 15.2 % (ref 11.5–15.5)
WBC: 21.1 10*3/uL — ABNORMAL HIGH (ref 4.0–10.5)
nRBC: 0.4 % — ABNORMAL HIGH (ref 0.0–0.2)

## 2019-01-18 LAB — PHOSPHORUS: Phosphorus: 1.5 mg/dL — ABNORMAL LOW (ref 2.5–4.6)

## 2019-01-18 LAB — MAGNESIUM: Magnesium: 2.8 mg/dL — ABNORMAL HIGH (ref 1.7–2.4)

## 2019-01-18 MED ORDER — INSULIN GLARGINE 100 UNIT/ML ~~LOC~~ SOLN
12.0000 [IU] | Freq: Every day | SUBCUTANEOUS | Status: DC
  Administered 2019-01-18: 12 [IU] via SUBCUTANEOUS
  Filled 2019-01-18 (×2): qty 0.12

## 2019-01-18 MED ORDER — INSULIN ASPART 100 UNIT/ML ~~LOC~~ SOLN
4.0000 [IU] | Freq: Once | SUBCUTANEOUS | Status: AC
  Administered 2019-01-18: 4 [IU] via SUBCUTANEOUS

## 2019-01-18 MED ORDER — ROPIVACAINE HCL 2 MG/ML IJ SOLN
12.0000 mL/h | INTRAMUSCULAR | Status: DC
  Administered 2019-01-18 – 2019-01-19 (×2): 12 mL/h via EPIDURAL
  Filled 2019-01-18 (×8): qty 200

## 2019-01-18 NOTE — Progress Notes (Addendum)
Dr. Barry Dienes paged regarding past 3 CBS's over 200  1709 Dr. Barry Dienes ordered an extra 4 units of novolog insulin subcu and increased lantus dose to 12 at 2200

## 2019-01-18 NOTE — Anesthesia Post-op Follow-up Note (Signed)
  Anesthesia Pain Follow-up Note  Patient: Leah Farmer  Day #: 3  Date of Follow-up: 01/18/2019 Time: 2:03 PM  Last Vitals:  Vitals:   01/18/19 1248 01/18/19 1300  BP:  (!) 144/85  Pulse:  (!) 135  Resp: 18 (!) 29  Temp:    SpO2: 94% 91%    Level of Consciousness: alert  Pain: mild   Side Effects:None  Catheter Site Exam:clean, dry, no drainage  Epidural / Intrathecal (From admission, onward)   Start     Dose/Rate Route Frequency Ordered Stop   01/15/19 1845  ropivacaine (PF) 2 mg/mL (0.2%) (NAROPIN) injection     10 mL/hr 10 mL/hr  Epidural Continuous 01/15/19 1837         Plan: Continue current therapy of postop epidural at surgeon's request. Orders modified to reflect current rate of 28ml/hour.   Renwick Asman P Hilmer Aliberti

## 2019-01-18 NOTE — Progress Notes (Signed)
Rehab Admissions Coordinator Note:  Per PT recommendation, patient was screened by Michel Santee for appropriateness for an Inpatient Acute Rehab Consult.  At this time, we are recommending Inpatient Rehab consult.  Please place a consult order if pt would like to be considered.    Michel Santee, PT, DPT 01/18/2019, 1:30 PM  I can be reached at [phone].

## 2019-01-18 NOTE — Progress Notes (Signed)
Physical Therapy Treatment Patient Details Name: Leah Farmer MRN: [id number] DOB: Sep 16, 1965 Today's Date: 01/18/2019    History of Present Illness pt is a 53 y/o female with pmh significant for HTN, arthritis, DM admitted with primary malignant pancreatic tumors x2, s/p whipple procedure.    PT Comments    Pt admitted with above diagnosis. Pt was able to mobilize with mod assist for bed mobility due to pain, mod assist for sit to stand and min assist of 2 to stand and pivot to the recliner with RW needing assist for right LE due to ankle sensation/weakness issues.  Pt progressing as she was able to get OOB today.  Will continue to follow acutely.  Pt currently with functional limitations due to the deficits listed below (see PT Problem List). Pt will benefit from skilled PT to increase their independence and safety with mobility to allow discharge to the venue listed below.     Follow Up Recommendations  CIR     Equipment Recommendations  Rolling walker with 5" wheels    Recommendations for Other Services Rehab consult     Precautions / Restrictions Precautions Precautions: Fall Precaution Comments: 3 JP drains, Epidural  Restrictions Weight Bearing Restrictions: No    Mobility  Bed Mobility Overal bed mobility: Needs Assistance Bed Mobility: Supine to Sit;Sit to Supine     Supine to sit: Max assist;+2 for physical assistance     General bed mobility comments: significant assist due to abdominal incision pain.  pt follow cues well.  Transfers Overall transfer level: Needs assistance Equipment used: Rolling walker (2 wheeled);None Transfers: Sit to/from [facility] Sit to Stand: +2 safety/equipment;Min assist Stand pivot transfers: Min assist;+2 safety/equipment       General transfer comment: pt's R ankle needed observation for safety due to continued weakness/decr sensation however pt was able to stand pivot to her right and did move the left  LE around for the transfer with pt using UES for support on RW to unweight LEs for the pivot.  Needed assist to move RW as well.  cues and assist to come forward and boost upright.  Pt stood best with face to face assist holding to the therapist.  Pt needed cues to get squared up to chair as she was wanting to sit too early and cues to reach back for chair.  Needed assist to control descent into chair as well.   Ambulation/Gait             General Gait Details: NT   Stairs             Wheelchair Mobility    Modified Rankin (Stroke Patients Only)       Balance Overall balance assessment: Needs assistance Sitting-balance support: Single extremity supported;Bilateral upper extremity supported Sitting balance-[name]: Poor Sitting balance - Comments: needing external truncal assist for sitting due to abdominal pain.   Standing balance support: Bilateral upper extremity supported Standing balance-[name]: Poor Standing balance comment: Needed UE support for balance with RW.                             Cognition Arousal/Alertness: Awake/alert Behavior During Therapy: WFL for tasks assessed/performed Overall Cognitive Status: Within Functional Limits for tasks assessed  Exercises      General Comments General comments (skin integrity, edema, etc.): HR 129 - 135 bpm, 94% on 3LO2, 136/77      Pertinent Vitals/Pain Pain Assessment: Faces Faces Pain Scale: Hurts even more Pain Location: abdominal incisions Pain Descriptors / Indicators: Aching;Burning;Grimacing;Guarding;Sore;Tender Pain Intervention(s): Limited activity within patient's tolerance;Repositioned;PCA encouraged;Premedicated before session;Monitored during session;Ice applied    Home Living                      Prior Function            PT Goals (current goals can now be found in the care plan section) Acute Rehab PT  Goals Patient Stated Goal: able to get home and into my chair for Christmas. Progress towards PT goals: Progressing toward goals    Frequency    Min 3X/week      PT Plan Current plan remains appropriate    Co-evaluation              AM-PAC PT "6 Clicks" Mobility   Outcome Measure  Help needed turning from your back to your side while in a flat bed without using bedrails?: A Lot Help needed moving from lying on your back to sitting on the side of a flat bed without using bedrails?: A Lot Help needed moving to and from a bed to a chair (including a wheelchair)?: A Lot Help needed standing up from a chair using your arms (e.g., wheelchair or bedside chair)?: A Lot Help needed to walk in hospital room?: Total Help needed climbing 3-5 steps with a railing? : Total 6 Click Score: 10    End of Session Equipment Utilized During Treatment: Gait belt;Oxygen(3LO2) Activity Tolerance: Patient limited by pain;Patient limited by fatigue Patient left: in chair;with call bell/phone within reach;with chair alarm set Nurse Communication: Mobility status;Need for lift equipment(Maxi sky if needed for return to bed vs stand pivot to left) PT Visit Diagnosis: Pain;Other abnormalities of gait and mobility (R26.89);Muscle weakness (generalized) (M62.81) Pain - part of body: (abdominal incision)     Time: 1010-1038 PT Time Calculation (min) (ACUTE ONLY): 28 min  Charges:  $Therapeutic Activity: 23-37 mins                     Kamorie Aldous W,PT Acute Rehabilitation Services Pager:  [phone]  Office:  [place] 01/18/2019, 10:59 AM

## 2019-01-18 NOTE — Addendum Note (Signed)
Addendum  created 01/18/19 1405 by Murvin Natal, MD   Clinical Note Signed, Order list changed

## 2019-01-18 NOTE — Progress Notes (Signed)
Late Entry Note for 12/17 Initial Evaluation  Pt admitted with/for primary malignant pancreatic tumors with surgical intervention including a whipple procedure.  Pt is presently experience significant pain and needing maximal assist of 2 persons for mobility .  Pt currently limited functionally due to the problems listed. ( See problems list.)   Pt will benefit from PT to maximize function and safety in order to get ready for next venue listed below.    01/17/19 1732  PT Visit Information  Last PT Received On 01/17/19  Assistance Needed +2  History of Present Illness pt is a 53 y/o female with pmh significant for HTN, arthritis, DM admitted with primary malignant pancreatic tumors x2, s/p whipple procedure.  Precautions  Precautions Fall  Home Living  Family/patient expects to be discharged to: Private residence  Living Arrangements Spouse/significant other  Available Help at Discharge Family;Available 24 hours/day  Type of Home House  Home Access Stairs to enter  Entrance Stairs-Number of Steps 1  Home Layout One level  Bathroom Shower/Tub Tub/shower unit;Walk-in Armed forces technical officer - single point (craftmatic-like bed.)  Prior Function  Level of Independence Independent  Comments worked outside the home.  Communication  Communication No difficulties  Pain Assessment  Pain Assessment Faces  Faces Pain Scale 6  Pain Location abdominal incisions  Pain Descriptors / Indicators Aching;Burning;Grimacing;Guarding;Sore;Tender  Pain Intervention(s) Monitored during session;Premedicated before session;Limited activity within patient's tolerance  Cognition  Arousal/Alertness Awake/alert  Behavior During Therapy WFL for tasks assessed/performed  Overall Cognitive Status Within Functional Limits for tasks assessed  Upper Extremity Assessment  Upper Extremity Assessment Overall WFL for tasks assessed (NT formally limited by abdominal pain)  Lower  Extremity Assessment  Lower Extremity Assessment RLE deficits/detail;LLE deficits/detail  RLE Deficits / Details numbness ankle distal, 1-2/5 pf, 0/5 df, o/w 4/5 overall  RLE Coordination decreased fine motor  LLE Deficits / Details WFL  Cervical / Trunk Assessment  Cervical / Trunk Assessment Kyphotic (painful posterior neck)  Bed Mobility  Overal bed mobility Needs Assistance  Bed Mobility Supine to Sit;Sit to Supine  Supine to sit Max assist;+2 for physical assistance  Sit to supine Max assist;+2 for physical assistance  General bed mobility comments significant assist due to abdominal incision pain.  pt follow cues well.  Transfers  Overall transfer level Needs assistance  Equipment used Rolling walker (2 wheeled);None  Transfers Sit to/from Stand  Sit to Stand Mod assist;Max assist;+2 safety/equipment  General transfer comment pt's R ankle needed blocking for safety.  cues and assist to come forward and boost upright.  Pt stood best with face to face assist holding to the therapist.  Ambulation/Gait  General Gait Details NT  Balance  Overall balance assessment Needs assistance  Sitting-balance support Single extremity supported;Bilateral upper extremity supported  Sitting balance-[name] Poor  Sitting balance - Comments needing external truncal assist for sitting due to abdominal pain.  Standing balance support Single extremity supported;Bilateral upper extremity supported  Standing balance-[name] Poor  PT - End of Session  Activity Tolerance Patient limited by pain  Patient left in bed;with call bell/phone within reach;with SCD's reapplied  Nurse Communication Mobility status  PT Assessment  PT Recommendation/Assessment Patient needs continued PT services  PT Visit Diagnosis Pain;Other abnormalities of gait and mobility (R26.89);Muscle weakness (generalized) (M62.81)  Pain - part of body  (abdominal incision)  PT Problem List Decreased strength;Decreased activity  tolerance;Decreased balance;Decreased mobility;Decreased knowledge of use of DME;Pain;Impaired sensation  PT Plan  PT Frequency (ACUTE ONLY) Min 3X/week  PT Treatment/Interventions (ACUTE ONLY) DME instruction;Gait training;Functional mobility training;Therapeutic activities;Therapeutic exercise;Balance training;Patient/family education  AM-PAC PT "6 Clicks" Mobility Outcome Measure (Version 2)  Help needed turning from your back to your side while in a flat bed without using bedrails? 1  Help needed moving from lying on your back to sitting on the side of a flat bed without using bedrails? 1  Help needed moving to and from a bed to a chair (including a wheelchair)? 1  Help needed standing up from a chair using your arms (e.g., wheelchair or bedside chair)? 1  Help needed to walk in hospital room? 1  Help needed climbing 3-5 steps with a railing?  1  6 Click Score 6  Consider Recommendation of Discharge To: CIR/SNF/LTACH  PT Recommendation  Recommendations for Other Services Rehab consult  Follow Up Recommendations CIR  PT equipment Rolling walker with 5" wheels  Individuals Consulted  Consulted and Agree with Results and Recommendations Patient  Acute Rehab PT Goals  Patient Stated Goal able to get home and into my chair for Christmas.  PT Goal Formulation With patient  Time For Goal Achievement 01/31/19  Potential to Achieve Goals Good  PT Time Calculation  PT Start Time (ACUTE ONLY) 1421  PT Stop Time (ACUTE ONLY) 1455  PT Time Calculation (min) (ACUTE ONLY) 34 min  PT General Charges  $$ ACUTE PT VISIT 1 Visit  PT Evaluation  $PT Eval Moderate Complexity 1 Mod  PT Treatments  $Therapeutic Activity 8-22 mins  Written Expression  Dominant Hand Right   01/18/2019  Ginger Carne., PT Acute Rehabilitation Services [phone]  (pager) [phone]  (office)

## 2019-01-18 NOTE — Progress Notes (Signed)
3 Days Post-Op   Subjective/Chief Complaint: Pt was able to get OOB yesterday.  No complains.  No n/v with NGT out.    Objective: Vital signs in last 24 hours: Temp:  [98.1 F (36.7 C)-98.4 F (36.9 C)] 98.1 F (36.7 C) (12/18 0800) Pulse Rate:  [111-124] 124 (12/18 1100) Resp:  [12-24] 23 (12/18 1100) BP: (110-139)/(60-83) 131/60 (12/18 1100) SpO2:  [89 %-96 %] 95 % (12/18 1100) Last BM Date: (PTA)  Intake/Output from previous day: 12/17 0701 - 12/18 0700 In: 1297.3 [I.V.:74.5; Blood:641.5; IV Piggyback:238.9] Out: 1700 [Urine:1175; Drains:525] Intake/Output this shift: Total I/O In: 60 [Other:60] Out: -   General appearance: alert, pale Resp: breathing comfortably Cardio: tachycardic but regular. GI: soft, non distended.  all three drains look like old blood.  Extremities: extremities normal, atraumatic, no cyanosis or edema  Lab Results:  Recent Labs    01/17/19 0440 01/18/19 0530  WBC 22.5* 21.1*  HGB 6.0* 6.9*  HCT 18.5* 21.6*  PLT 170 165   BMET Recent Labs    01/17/19 0440 01/18/19 0530  NA 137 143  K 3.8 4.0  CL 109 112*  CO2 17* 19*  GLUCOSE 237* 214*  BUN 30* 26*  CREATININE 1.14* 1.03*  CALCIUM 7.0* 7.3*   PT/INR Recent Labs    01/16/19 0610  LABPROT 16.2*  INR 1.3*   ABG Recent Labs    01/15/19 1347 01/15/19 1630  PHART 7.273* 7.177*  HCO3 17.1* 11.3*    Studies/Results: No results found.  Anti-infectives: Anti-infectives (From admission, onward)   Start     Dose/Rate Route Frequency Ordered Stop   01/15/19 2000  ceFAZolin (ANCEF) IVPB 2g/100 mL premix     2 g 200 mL/hr over 30 Minutes Intravenous Every 8 hours 01/15/19 1712 01/15/19 2052   01/15/19 0600  ceFAZolin (ANCEF) IVPB 2g/100 mL premix     2 g 200 mL/hr over 30 Minutes Intravenous On call to O.R. 01/15/19 0549 01/15/19 1145      Assessment/Plan: s/p Procedure(s): WHIPPLE PROCEDURE (N/A) LAPAROSCOPY DIAGNOSTIC (N/A) DISTAL PANCREATECTOMY  (N/A) Neuroendocrine cancer of the pancreas.  Await pathology.  Multifocal.    ABL anemia -may need another unit of blood DM - increase lantus again Acidosis - was likely related to blood loss/underresuscitation.  CO2 coming up.   AKI - increased IVF yesterday.  Cr coming down.   Hypomagnesemia and hypophosphatemia - repleted,needs more phos today. Elevated LFTs - improving  Off neo.   Epidural/PCA for pain control, PRN robaxin.    Clear liq diet today. Stepdown if bed available.   LOS: 3 days    Stark Klein 01/18/2019

## 2019-01-19 ENCOUNTER — Inpatient Hospital Stay (HOSPITAL_COMMUNITY): Payer: BC Managed Care – PPO

## 2019-01-19 DIAGNOSIS — C7A8 Other malignant neuroendocrine tumors: Principal | ICD-10-CM

## 2019-01-19 DIAGNOSIS — F329 Major depressive disorder, single episode, unspecified: Secondary | ICD-10-CM | POA: Diagnosis present

## 2019-01-19 DIAGNOSIS — E669 Obesity, unspecified: Secondary | ICD-10-CM | POA: Diagnosis present

## 2019-01-19 DIAGNOSIS — J189 Pneumonia, unspecified organism: Secondary | ICD-10-CM | POA: Diagnosis present

## 2019-01-19 DIAGNOSIS — I10 Essential (primary) hypertension: Secondary | ICD-10-CM | POA: Diagnosis present

## 2019-01-19 DIAGNOSIS — F32A Depression, unspecified: Secondary | ICD-10-CM | POA: Diagnosis present

## 2019-01-19 DIAGNOSIS — E1165 Type 2 diabetes mellitus with hyperglycemia: Secondary | ICD-10-CM | POA: Diagnosis present

## 2019-01-19 DIAGNOSIS — E1169 Type 2 diabetes mellitus with other specified complication: Secondary | ICD-10-CM | POA: Diagnosis present

## 2019-01-19 LAB — URINALYSIS, COMPLETE (UACMP) WITH MICROSCOPIC
Bacteria, UA: NONE SEEN
Bilirubin Urine: NEGATIVE
Glucose, UA: NEGATIVE mg/dL
Ketones, ur: NEGATIVE mg/dL
Leukocytes,Ua: NEGATIVE
Nitrite: NEGATIVE
Protein, ur: 100 mg/dL — AB
Specific Gravity, Urine: 1.025 (ref 1.005–1.030)
pH: 6 (ref 5.0–8.0)

## 2019-01-19 LAB — GLUCOSE, CAPILLARY
Glucose-Capillary: 226 mg/dL — ABNORMAL HIGH (ref 70–99)
Glucose-Capillary: 271 mg/dL — ABNORMAL HIGH (ref 70–99)
Glucose-Capillary: 265 mg/dL — ABNORMAL HIGH (ref 70–99)
Glucose-Capillary: 239 mg/dL — ABNORMAL HIGH (ref 70–99)
Glucose-Capillary: 251 mg/dL — ABNORMAL HIGH (ref 70–99)
Glucose-Capillary: 200 mg/dL — ABNORMAL HIGH (ref 70–99)

## 2019-01-19 LAB — COMPREHENSIVE METABOLIC PANEL
ALT: 42 U/L (ref 0–44)
AST: 25 U/L (ref 15–41)
Albumin: 2.8 g/dL — ABNORMAL LOW (ref 3.5–5.0)
Alkaline Phosphatase: 54 U/L (ref 38–126)
Anion gap: 11 (ref 5–15)
BUN: 24 mg/dL — ABNORMAL HIGH (ref 6–20)
CO2: 20 mmol/L — ABNORMAL LOW (ref 22–32)
Calcium: 7.5 mg/dL — ABNORMAL LOW (ref 8.9–10.3)
Chloride: 108 mmol/L (ref 98–111)
Creatinine, Ser: 0.99 mg/dL (ref 0.44–1.00)
GFR calc Af Amer: >60 mL/min (ref >60)
GFR calc non Af Amer: >60 mL/min (ref >60)
Glucose, Bld: 253 mg/dL — ABNORMAL HIGH (ref 70–99)
Potassium: 4 mmol/L (ref 3.5–5.1)
Sodium: 139 mmol/L (ref 135–145)
Total Bilirubin: 0.5 mg/dL (ref 0.3–1.2)
Total Protein: 5.9 g/dL — ABNORMAL LOW (ref 6.5–8.1)

## 2019-01-19 LAB — CBC
HCT: 23.9 % — ABNORMAL LOW (ref 36.0–46.0)
Hemoglobin: 7.4 g/dL — ABNORMAL LOW (ref 12.0–15.0)
MCH: 29.2 pg (ref 26.0–34.0)
MCHC: 31 g/dL (ref 30.0–36.0)
MCV: 94.5 fL (ref 80.0–100.0)
Platelets: 221 10*3/uL (ref 150–400)
RBC: 2.53 MIL/uL — ABNORMAL LOW (ref 3.87–5.11)
RDW: 15.4 % (ref 11.5–15.5)
WBC: 30.5 10*3/uL — ABNORMAL HIGH (ref 4.0–10.5)
nRBC: 1 % — ABNORMAL HIGH (ref 0.0–0.2)

## 2019-01-19 LAB — MAGNESIUM: Magnesium: 2.4 mg/dL (ref 1.7–2.4)

## 2019-01-19 LAB — PHOSPHORUS: Phosphorus: 1.2 mg/dL — ABNORMAL LOW (ref 2.5–4.6)

## 2019-01-19 MED ORDER — INSULIN GLARGINE 100 UNIT/ML ~~LOC~~ SOLN
10.0000 [IU] | Freq: Two times a day (BID) | SUBCUTANEOUS | Status: DC
  Administered 2019-01-19 (×2): 10 [IU] via SUBCUTANEOUS
  Filled 2019-01-19 (×4): qty 0.1

## 2019-01-19 MED ORDER — OXYCODONE HCL 5 MG PO TABS: 5.0000 mg | ORAL_TABLET | Freq: Four times a day (QID) | ORAL | 0 refills | Status: AC | PRN

## 2019-01-19 MED ORDER — VANCOMYCIN HCL 1500 MG/300ML IV SOLN
1500.0000 mg | INTRAVENOUS | Status: DC
  Administered 2019-01-19 – 2019-01-21 (×3): 1500 mg via INTRAVENOUS
  Filled 2019-01-19 (×5): qty 300

## 2019-01-19 MED ORDER — ENOXAPARIN SODIUM 40 MG/0.4ML ~~LOC~~ SOLN: 40.0000 mg | SUBCUTANEOUS | Status: DC

## 2019-01-19 MED ORDER — INSULIN GLARGINE 100 UNIT/ML ~~LOC~~ SOLN
16.0000 [IU] | Freq: Every day | SUBCUTANEOUS | Status: DC
  Filled 2019-01-19: qty 0.16

## 2019-01-19 MED ORDER — SODIUM CHLORIDE 0.9 % IV SOLN
2.0000 g | Freq: Three times a day (TID) | INTRAVENOUS | Status: DC
  Administered 2019-01-19 – 2019-01-29 (×30): 2 g via INTRAVENOUS
  Filled 2019-01-19 (×33): qty 2

## 2019-01-19 MED ORDER — PROCHLORPERAZINE MALEATE 10 MG PO TABS: 10.0000 mg | ORAL_TABLET | Freq: Four times a day (QID) | ORAL | 0 refills | Status: DC | PRN

## 2019-01-19 MED ORDER — SODIUM PHOSPHATES 45 MMOLE/15ML IV SOLN
30.0000 mmol | Freq: Once | INTRAVENOUS | Status: AC
  Administered 2019-01-19: 30 mmol via INTRAVENOUS
  Filled 2019-01-19: qty 10

## 2019-01-19 MED ORDER — SIMETHICONE 80 MG PO CHEW
160.0000 mg | CHEWABLE_TABLET | Freq: Two times a day (BID) | ORAL | Status: DC | PRN
  Administered 2019-01-19 – 2019-01-20 (×3): 160 mg via ORAL
  Filled 2019-01-19 (×3): qty 2

## 2019-01-19 NOTE — Discharge Instructions (Addendum)
Local Endocrinologists [facility] [phone]) 1. Dr. Philemon Kingdom  2. Dr. Elayne Snare       3.   Dr Karen Chafe [facility] [phone]) 1. Dr. Delrae Rend [facility] [phone]) 1. Dr. Jacelyn Pi 2. Dr. Anda Kraft [facility] [phone]) 1. Dr. Daneil Dolin [facility] [phone]) [[place] office]  [phone]) [[place] office] 1. Dr. Lenna Sciara Solum 2. Dr. Judithann Sheen [facility]) [phone]) 1. Autumn Hudnall Ronnald Ramp), PA 2. Dr. Amalia Greenhouse 3. Dr. Marsh Dolly. [facility] [phone]) 1. Dr. Glade Lloyd [facility] [phone]) 1. Dr. Orville Govern 2. Dr. Lelon Huh 3. Dr. Jerelene Redden 4. Alwyn Ren, FNP Dr. Carolynn Serve. [facility] in [place] [phone])   Insulin Injection Instructions, Single Insulin Dose, Adult A subcutaneous injection is a shot of medicine that is injected into the layer of fat and tissue between skin and muscle. People with type 1 diabetes must take insulin because their bodies do not make it. People with type 2 diabetes may need to take insulin.  There are many different types of insulin. The type of insulin that you take may determine how many injections you give yourself and when you need to give the injections. Supplies needed:  Soap and water to wash hands.  A new, unused insulin syringe.  Your insulin medication bottle (vial).  Alcohol wipes.  A disposal container that is meant for sharp items (sharps container), such as an empty plastic bottle with a cover. How to choose a site for injection The body absorbs insulin differently, depending on where the insulin is injected (injection site). It is best to inject insulin into the same body area each time (for example, always in the abdomen), but you should use a different spot in that  area for each injection. Do not inject the insulin in the same spot each time. There are five main areas that can be used for injecting. These areas include:  Abdomen. This is the preferred area.  Front of thigh.  Upper, outer side of thigh.  Upper, outer side of arm.  Upper, outer part of buttock. How to give a single-dose insulin injection First, follow the steps for Get ready, then continue with the steps for Push air into the vial, then follow the steps for Fill the syringe, and finish with the steps for Inject the insulin. Get ready 1. Wash your hands with soap and water. If soap and water are not available, use hand sanitizer. 2. Before you give yourself an insulin injection, be sure to test your blood sugar level (blood glucose level) and write down that number. Follow any instructions from your health care provider about what to do if your blood glucose level is higher or lower than your normal range. 3. Use a new, unused insulin syringe each time you need to inject insulin. 4. Check to make sure you have the correct type of insulin syringe for the concentration of insulin that you are using. 5. Check the expiration date and the type of insulin that you are using. 6. If you are using CLEAR insulin, check to see that it is clear and free of clumps. 7. Do not shake the vial to get it ready. Gently roll the vial between your palms several times. 8. Remove the plastic pop-top covering from the vial of insulin. This type of covering is present on a vial when it is new. 9. Use an alcohol wipe  to clean the rubber top of the vial. 10. Remove the plastic cover from the syringe needle. Do not let the needle touch anything. Push air into the vial 1. To bring (draw up) air into the syringe, slowly pull back on the syringe plunger. Stop pulling the plunger when the dose indicator gets to the number of units that you will be using. 2. While you keep the vial right-side-up, poke the needle through  the rubber top of the vial. Do not turn the vial upside down to do this. 3. Push the plunger all the way into the syringe. Doing that will push air into the vial. 4. Do not take the needle out of the vial yet. Fill the syringe  1. While the needle is still in the vial, turn the vial upside down and hold it at eye level. 2. Slowly pull back on the plunger. Stop pulling the plunger when the dose indicator gets to the desired number of units. 3. If you see air bubbles in the syringe, slowly move the plunger up and down 2 or 3 times to make them go away. ? If you had to move the plunger to get rid of air bubbles, pull back the plunger until the dose indicator returns to the correct dose. 4. Remove the needle from the vial. Do not let the needle touch anything. Inject the insulin  1. Use an alcohol wipe to clean the site where you will be injecting the needle. Let the site air-dry. 2. Hold the syringe in your writing hand like a pencil. 3. Use your other hand to pinch and hold about an inch (2.5 cm) of skin. Do not directly touch the cleaned part of the skin. 4. Gently but quickly, put the needle straight into the skin. The needle should be at a 90-degree angle (perpendicular) to the skin. 5. Push the needle in as far as it will go (to the hub). 6. When the needle is completely inserted into the skin, use your thumb or index finger of your writing hand to push the plunger all the way into the syringe to inject the insulin. 7. Let go of the skin that you are pinching. Continue to hold the syringe in place with your writing hand. 8. Wait 10 seconds, then pull the needle straight out of the skin. This will allow all of the insulin to go from the syringe and needle into your body. 9. Press and hold the alcohol wipe over the injection site until any bleeding stops. Do not rub the area. 10. Do not put the plastic cover back on the needle. 11. Discard the syringe and needle directly into a sharps  container, such as an empty plastic bottle with a cover. How to throw away supplies  Discard all used needles in a puncture-proof sharps disposal container. You can ask your local pharmacy about where you can get this kind of disposal container, or you can use an empty plastic liquid laundry detergent bottle that has a cover.  Follow the disposal regulations for the area where you live. Do not use any syringe or needle more than one time.  Throw away empty vials in the regular trash. Questions to ask your health care provider  How often should I be taking insulin?  How often should I check my blood glucose?  What amount of insulin should I be taking at each time?  What are the side effects?  What should I do if my blood glucose is too high?  What should I do if my blood glucose is too low?  What should I do if I forget to take my insulin?  What number should I call if I have questions? Where to find more information  American Diabetes Association (ADA): www.diabetes.org  American Association of Diabetes Educators (AADE) Patient Resources: https://www.diabeteseducator.org Summary  A subcutaneous injection is a shot of medicine that is injected into the layer of fat and tissue between skin and muscle.  Before you give yourself an insulin injection, be sure to test your blood sugar level (blood glucose level) and write down that number.  The type of insulin that you take may determine how many injections you give yourself and when you need to give the injections.  Check the expiration date and the type of insulin that you are using.  It is best to inject insulin into the same body area each time (for example, always in the abdomen), but you should use a different spot in that area for each injection. This information is not intended to replace advice given to you by your health care provider. Make sure you discuss any questions you have with your health care provider. Document  Released: 02/20/2015 Document Revised: 01/20/2017 Document Reviewed: 02/20/2015 Elsevier Patient Education  2020 [employer]. Hypoglycemia Hypoglycemia is when the sugar (glucose) level in your blood is too low. Signs of low blood sugar may include:  Feeling: ? Hungry. ? Worried or nervous (anxious). ? Sweaty and clammy. ? Confused. ? Dizzy. ? Sleepy. ? Sick to your stomach (nauseous).  Having: ? A fast heartbeat. ? A headache. ? A change in your vision. ? Tingling or no feeling (numbness) around your mouth, lips, or tongue. ? Jerky movements that you cannot control (seizure).  Having trouble with: ? Moving (coordination). ? Sleeping. ? Passing out (fainting). ? Getting upset easily (irritability). Low blood sugar can happen to people who have diabetes and people who do not have diabetes. Low blood sugar can happen quickly, and it can be an emergency. Treating low blood sugar Low blood sugar is often treated by eating or drinking something sugary right away, such as:  Fruit juice, 4-6 oz (120-150 mL).  Regular soda (not diet soda), 4-6 oz (120-150 mL).  Low-fat milk, 4 oz (120 mL).  Several pieces of hard candy.  Sugar or honey, 1 Tbsp (15 mL). Treating low blood sugar if you have diabetes If you can think clearly and swallow safely, follow the 15:15 rule:  Take 15 grams of a fast-acting carb (carbohydrate). Talk with your doctor about how much you should take.  Always keep a source of fast-acting carb with you, such as: ? Sugar tablets (glucose pills). Take 3-4 pills. ? 6-8 pieces of hard candy. ? 4-6 oz (120-150 mL) of fruit juice. ? 4-6 oz (120-150 mL) of regular (not diet) soda. ? 1 Tbsp (15 mL) honey or sugar.  Check your blood sugar 15 minutes after you take the carb.  If your blood sugar is still at or below 70 mg/dL (3.9 mmol/L), take 15 grams of a carb again.  If your blood sugar does not go above 70 mg/dL (3.9 mmol/L) after 3 tries, get help right  away.  After your blood sugar goes back to normal, eat a meal or a snack within 1 hour.  Treating very low blood sugar If your blood sugar is at or below 54 mg/dL (3 mmol/L), you have very low blood sugar (severe hypoglycemia). This may also cause:  Passing  out.  Jerky movements you cannot control (seizure).  Losing consciousness (coma). This is an emergency. Do not wait to see if the symptoms will go away. Get medical help right away. Call your local emergency services (911 in the [place] Do not drive yourself to the hospital. If you have very low blood sugar and you cannot eat or drink, you may need a glucagon shot (injection). A family member or friend should learn how to check your blood sugar and how to give you a glucagon shot. Ask your doctor if you need to have a glucagon shot kit at home. Follow these instructions at home: General instructions  Take over-the-counter and prescription medicines only as told by your doctor.  Stay aware of your blood sugar as told by your doctor.  Limit alcohol intake to no more than 1 drink a day for nonpregnant women and 2 drinks a day for men. One drink equals 12 oz of beer (355 mL), 5 oz of wine (148 mL), or 1 oz of hard liquor (44 mL).  Keep all follow-up visits as told by your doctor. This is important. If you have diabetes:   Follow your diabetes care plan as told by your doctor. Make sure you: ? Know the signs of low blood sugar. ? Take your medicines as told. ? Follow your exercise and meal plan. ? Eat on time. Do not skip meals. ? Check your blood sugar as often as told by your doctor. Always check it before and after exercise. ? Follow your sick day plan when you cannot eat or drink normally. Make this plan ahead of time with your doctor.  Share your diabetes care plan with: ? Your work or school. ? People you live with.  Check your pee (urine) for ketones: ? When you are sick. ? As told by your doctor.  Carry a card or wear  jewelry that says you have diabetes. Contact a doctor if:  You have trouble keeping your blood sugar in your target range.  You have low blood sugar often. Get help right away if:  You still have symptoms after you eat or drink something sugary.  Your blood sugar is at or below 54 mg/dL (3 mmol/L).  You have jerky movements that you cannot control.  You pass out. These symptoms may be an emergency. Do not wait to see if the symptoms will go away. Get medical help right away. Call your local emergency services (911 in the [place] Do not drive yourself to the hospital. Summary  Hypoglycemia happens when the level of sugar (glucose) in your blood is too low.  Low blood sugar can happen to people who have diabetes and people who do not have diabetes. Low blood sugar can happen quickly, and it can be an emergency.  Make sure you know the signs of low blood sugar and know how to treat it.  Always keep a source of sugar (fast-acting carb) with you to treat low blood sugar. This information is not intended to replace advice given to you by your health care provider. Make sure you discuss any questions you have with your health care provider. Document Released: 04/13/2009 Document Revised: 05/10/2018 Document Reviewed: 02/20/2015 Elsevier Patient Education  [place]. Hyperglycemia Hyperglycemia occurs when the level of sugar (glucose) in the blood is too high. Glucose is a type of sugar that provides the body's main source of energy. Certain hormones (insulin and glucagon) control the level of glucose in the blood. Insulin lowers  blood glucose, and glucagon increases blood glucose. Hyperglycemia can result from having too little insulin in the bloodstream, or from the body not responding normally to insulin. Hyperglycemia occurs most often in people who have diabetes (diabetes mellitus), but it can happen in people who do not have diabetes. It can develop quickly, and it can be  life-threatening if it causes you to become severely dehydrated (diabetic ketoacidosis or hyperglycemic hyperosmolar state). Severe hyperglycemia is a medical emergency. What are the causes? If you have diabetes, hyperglycemia may be caused by:  Diabetes medicine.  Medicines that increase blood glucose or affect your diabetes control.  Not eating enough, or not eating often enough.  Changes in physical activity level.  Being sick or having an infection. If you have prediabetes or undiagnosed diabetes:  Hyperglycemia may be caused by those conditions. If you do not have diabetes, hyperglycemia may be caused by:  Certain medicines, including steroid medicines, beta-blockers, epinephrine, and thiazide diuretics.  Stress.  Serious illness.  Surgery.  Diseases of the pancreas.  Infection. What increases the risk? Hyperglycemia is more likely to develop in people who have risk factors for diabetes, such as:  Having a family member with diabetes.  Having a gene for type 1 diabetes that is passed from parent to child (inherited).  Living in an area with cold weather conditions.  Exposure to certain viruses.  Certain conditions in which the body's disease-fighting (immune) system attacks itself (autoimmune disorders).  Being overweight or obese.  Having an inactive (sedentary) lifestyle.  Having been diagnosed with insulin resistance.  Having a history of prediabetes, gestational diabetes, or polycystic ovarian syndrome (PCOS).  Being of American-Indian, African-American, Hispanic/Latino, or Asian/Pacific Islander descent. What are the signs or symptoms? Hyperglycemia may not cause any symptoms. If you do have symptoms, they may include early warning signs, such as:  Increased thirst.  Hunger.  Feeling very tired.  Needing to urinate more often than usual.  Blurry vision. Other symptoms may develop if hyperglycemia gets worse, such as:  Dry mouth.  Loss of  appetite.  Fruity-smelling breath.  Weakness.  Unexpected or rapid weight gain or weight loss.  Tingling or numbness in the hands or feet.  Headache.  Skin that does not quickly return to normal after being lightly pinched and released (poor skin turgor).  Abdominal pain.  Cuts or bruises that are slow to heal. How is this diagnosed? Hyperglycemia is diagnosed with a blood test to measure your blood glucose level. This blood test is usually done while you are having symptoms. Your health care provider may also do a physical exam and review your medical history. You may have more tests to determine the cause of your hyperglycemia, such as:  A fasting blood glucose (FBG) test. You will not be allowed to eat (you will fast) for at least 8 hours before a blood sample is taken.  An A1c (hemoglobin A1c) blood test. This provides information about blood glucose control over the previous 2-3 months.  An oral glucose tolerance test (OGTT). This measures your blood glucose at two times: ? After fasting. This is your baseline blood glucose level. ? Two hours after drinking a beverage that contains glucose. How is this treated? Treatment depends on the cause of your hyperglycemia. Treatment may include:  Taking medicine to regulate your blood glucose levels. If you take insulin or other diabetes medicines, your medicine or dosage may be adjusted.  Lifestyle changes, such as exercising more, eating healthier foods, or losing weight.  Treating an illness or infection, if this caused your hyperglycemia.  Checking your blood glucose more often.  Stopping or reducing steroid medicines, if these caused your hyperglycemia. If your hyperglycemia becomes severe and it results in hyperglycemic hyperosmolar state, you must be hospitalized and given IV fluids. Follow these instructions at home:  General instructions  Take over-the-counter and prescription medicines only as told by your health  care provider.  Do not use any products that contain nicotine or tobacco, such as cigarettes and e-cigarettes. If you need help quitting, ask your health care provider.  Limit alcohol intake to no more than 1 drink per day for nonpregnant women and 2 drinks per day for men. One drink equals 12 oz of beer, 5 oz of wine, or 1 oz of hard liquor.  Learn to manage stress. If you need help with this, ask your health care provider.  Keep all follow-up visits as told by your health care provider. This is important. Eating and drinking   Maintain a healthy weight.  Exercise regularly, as directed by your health care provider.  Stay hydrated, especially when you exercise, get sick, or spend time in hot temperatures.  Eat healthy foods, such as: ? Lean proteins. ? Complex carbohydrates. ? Fresh fruits and vegetables. ? Low-fat dairy products. ? Healthy fats.  Drink enough fluid to keep your urine clear or pale yellow. If you have diabetes:  Make sure you know the symptoms of hyperglycemia.  Follow your diabetes management plan, as told by your health care provider. Make sure you: ? Take your insulin and medicines as directed. ? Follow your exercise plan. ? Follow your meal plan. Eat on time, and do not skip meals. ? Check your blood glucose as often as directed. Make sure to check your blood glucose before and after exercise. If you exercise longer or in a different way than usual, check your blood glucose more often. ? Follow your sick day plan whenever you cannot eat or drink normally. Make this plan in advance with your health care provider.  Share your diabetes management plan with people in your workplace, school, and household.  Check your urine for ketones when you are ill and as told by your health care provider.  Carry a medical alert card or wear medical alert jewelry. Contact a health care provider if:  Your blood glucose is at or above 240 mg/dL (13.3 mmol/L) for 2 days  in a row.  You have problems keeping your blood glucose in your target range.  You have frequent episodes of hyperglycemia. Get help right away if:  You have difficulty breathing.  You have a change in how you think, feel, or act (mental status).  You have nausea or vomiting that does not go away. These symptoms may represent a serious problem that is an emergency. Do not wait to see if the symptoms will go away. Get medical help right away. Call your local emergency services (911 in the [place] Do not drive yourself to the hospital. Summary  Hyperglycemia occurs when the level of sugar (glucose) in the blood is too high.  Hyperglycemia is diagnosed with a blood test to measure your blood glucose level. This blood test is usually done while you are having symptoms. Your health care provider may also do a physical exam and review your medical history.  If you have diabetes, follow your diabetes management plan as told by your health care provider.  Contact your health care provider if you have problems  keeping your blood glucose in your target range. This information is not intended to replace advice given to you by your health care provider. Make sure you discuss any questions you have with your health care provider. Document Released: 07/13/2000 Document Revised: 10/05/2015 Document Reviewed: 10/05/2015 Elsevier Patient Education  [place]. Hemoglobin A1c Test Why am I having this test? You may have the hemoglobin A1c test (HbA1c test) done to:  Evaluate your risk for developing diabetes (diabetes mellitus).  Diagnose diabetes.  Monitor long-term control of blood sugar (glucose) in people who have diabetes and help make treatment decisions. This test may be done with other blood glucose tests, such as fasting blood glucose and oral glucose tolerance tests. What is being tested? Hemoglobin is a type of protein in the blood that carries oxygen. Glucose attaches to  hemoglobin to form glycated hemoglobin. This test checks the amount of glycated hemoglobin in your blood, which is a good indicator of the average amount of glucose in your blood during the past 2-3 months. What kind of sample is taken?  A blood sample is required for this test. It is usually collected by inserting a needle into a blood vessel. Tell a health care provider about:  All medicines you are taking, including vitamins, herbs, eye drops, creams, and over-the-counter medicines.  Any blood disorders you have.  Any surgeries you have had.  Any medical conditions you have.  Whether you are pregnant or may be pregnant. How are the results reported? Your results will be reported as a percentage that indicates how much of your hemoglobin has glucose attached to it (is glycated). Your health care provider will compare your results to normal ranges that were established after testing a large group of people (reference ranges). Reference ranges may vary among labs and hospitals. For this test, common reference ranges are:  Adult or child without diabetes: 4-5.6%.  Adult or child with diabetes and good blood glucose control: less than 7%. What do the results mean? If you have diabetes:  A result of less than 7% is considered normal, meaning that your blood glucose is well controlled.  A result higher than 7% means that your blood glucose is not well controlled, and your treatment plan may need to be adjusted. If you do not have diabetes:  A result within the reference range is considered normal, meaning that you are not at high risk for diabetes.  A result of 5.7-6.4% means that you have a high risk of developing diabetes, and you may have prediabetes. Prediabetes is the condition of having a blood glucose level that is higher than it should be, but not high enough for you to be diagnosed with diabetes. Having prediabetes puts you at risk for developing type 2 diabetes (type 2 diabetes  mellitus). You may have more tests, including a repeat HbA1c test.  Results of 6.5% or higher on two separate HbA1c tests mean that you have diabetes. You may have more tests to confirm the diagnosis. Abnormally low HbA1c values may be caused by:  Pregnancy.  Severe blood loss.  Receiving donated blood (transfusions).  Low red blood cell count (anemia).  Long-term kidney failure.  Some unusual forms (variants) of hemoglobin. Talk with your health care provider about what your results mean. Questions to ask your health care provider Ask your health care provider, or the department that is doing the test:  When will my results be ready?  How will I get my results?  What are  my treatment options?  What other tests do I need?  What are my next steps? Summary  The hemoglobin A1c test (HbA1c test) may be done to evaluate your risk for developing diabetes, to diagnose diabetes, and to monitor long-term control of blood sugar (glucose) in people who have diabetes and help make treatment decisions.  Hemoglobin is a type of protein in the blood that carries oxygen. Glucose attaches to hemoglobin to form glycated hemoglobin. This test checks the amount of glycated hemoglobin in your blood, which is a good indicator of the average amount of glucose in your blood during the past 2-3 months.  Talk with your health care provider about what your results mean. This information is not intended to replace advice given to you by your health care provider. Make sure you discuss any questions you have with your health care provider. Document Released: 02/09/2004 Document Revised: 12/30/2016 Document Reviewed: 08/30/2016 Elsevier Patient Education  2020 [place] Surgical drains are used to remove extra fluid that normally builds up in a surgical wound after surgery. A surgical drain helps to heal a surgical wound. Different kinds of surgical drains include:  Active  drains. These drains use suction to pull drainage away from the surgical wound. Drainage flows through a tube to a container outside of the body. With these drains, you need to keep the bulb or the drainage container flat (compressed) at all times, except while you empty it. Flattening the bulb or container creates suction.  Passive drains. These drains allow fluid to drain naturally, by gravity. Drainage flows through a tube to a bandage (dressing) or a container outside of the body. Passive drains do not need to be emptied. A drain is placed during surgery. Right after surgery, drainage is usually bright red and a little thicker than water. The drainage may gradually turn yellow or pink and become thinner. It is likely that your health care provider will remove the drain when the drainage stops or when the amount decreases to 1-2 Tbsp (15-30 mL) during a 24-hour period. Supplies needed:  Tape.  Germ-free cleaning solution (sterile saline).  Cotton swabs.  Split gauze drain sponge: 4 x 4 inches (10 x 10 cm).  Gauze square: 4 x 4 inches (10 x 10 cm). How to care for your surgical drain Care for your drain as told by your health care provider. This is important to help prevent infection. If your drain is placed at your back, or any other hard-to-reach area, ask another person to assist you in performing the following tasks: General care  Keep the skin around the drain dry and covered with a dressing at all times.  Check your drain area every day for signs of infection. Check for: ? Redness, swelling, or pain. ? Pus or a bad smell. ? Cloudy drainage. ? Tenderness or pressure at the drain exit site. Changing the dressing Follow instructions from your health care provider about how to change your dressing. Change your dressing at least once a day. Change it more often if needed to keep the dressing dry. Make sure you: 1. Gather your supplies. 2. Wash your hands with soap and water before you  change your dressing. If soap and water are not available, use hand sanitizer. 3. Remove the old dressing. Avoid using scissors to do that. 4. Wash your hands with soap and water again after removing the old dressing. 5. Use sterile saline to clean your skin around the drain. You may  need to use a cotton swab to clean the skin. 6. Place the tube through the slit in a drain sponge. Place the drain sponge so that it covers your wound. 7. Place the gauze square or another drain sponge on top of the drain sponge that is on the wound. Make sure the tube is between those layers. 8. Tape the dressing to your skin. 9. Tape the drainage tube to your skin 1-2 inches (2.5-5 cm) below the place where the tube enters your body. Taping keeps the tube from pulling on any stitches (sutures) that you have. 10. Wash your hands with soap and water. 11. Write down the color of your drainage and how often you change your dressing. How to empty your active drain  1. Make sure that you have a measuring cup that you can empty your drainage into. 2. Wash your hands with soap and water. If soap and water are not available, use hand sanitizer. 3. Loosen any pins or clips that hold the tube in place. 4. If your health care provider tells you to strip the tube to prevent clots and tube blockages: ? Hold the tube at the skin with one hand. Use your other hand to pinch the tubing with your thumb and first finger. ? Gently move your fingers down the tube while squeezing very lightly. This clears any drainage, clots, or tissue from the tube. ? You may need to do this several times each day to keep the tube clear. Do not pull on the tube. 5. Open the bulb cap or the drain plug. Do not touch the inside of the cap or the bottom of the plug. 6. Turn the device upside down and gently squeeze. 7. Empty all of the drainage into the measuring cup. 8. Compress the bulb or the container and replace the cap or the plug. To compress the  bulb or the container, squeeze it firmly in the middle while you close the cap or plug the container. 9. Write down the amount of drainage that you have in each 24-hour period. If you have less than 2 Tbsp (30 mL) of drainage during 24 hours, contact your health care provider. 10. Flush the drainage down the toilet. 11. Wash your hands with soap and water. Contact a health care provider if:  You have redness, swelling, or pain around your drain area.  You have pus or a bad smell coming from your drain area.  You have a fever or chills.  The skin around your drain is warm to the touch.  The amount of drainage that you have is increasing instead of decreasing.  You have drainage that is cloudy.  There is a sudden stop or a sudden decrease in the amount of drainage that you have.  Your drain tube falls out.  Your active drain does not stay compressed after you empty it. Summary  Surgical drains are used to remove extra fluid that normally builds up in a surgical wound after surgery.  Different kinds of surgical drains include active drains and passive drains. Active drains use suction to pull drainage away from the surgical wound, and passive drains allow fluid to drain naturally.  It is important to care for your drain to prevent infection. If your drain is placed at your back, or any other hard-to-reach area, ask another person to assist you.  Contact your health care provider if you have redness, swelling, or pain around your drain area. This information is not intended to replace  advice given to you by your health care provider. Make sure you discuss any questions you have with your health care provider. Document Released: 01/15/2000 Document Revised: 02/21/2018 Document Reviewed: 02/21/2018 Elsevier Patient Education  2020 [place] Office Phone Number [phone]   POST OP INSTRUCTIONS  Always review your discharge instruction sheet given  to you by the facility where your surgery was performed.  IF YOU HAVE DISABILITY OR FAMILY LEAVE FORMS, YOU MUST BRING THEM TO THE OFFICE FOR PROCESSING.  DO NOT GIVE THEM TO YOUR DOCTOR.  1. A prescription for pain medication may be given to you upon discharge.  Take your pain medication as prescribed, if needed.  If narcotic pain medicine is not needed, then you may take acetaminophen (Tylenol) or ibuprofen (Advil) as needed. 2. Take your usually prescribed medications unless otherwise directed 3. If you need a refill on your pain medication, please contact your pharmacy.  They will contact our office to request authorization.  Prescriptions will not be filled after 5pm or on week-ends. 4. You should eat very light the first 24 hours after surgery, such as soup, crackers, pudding, etc.  Resume your normal diet the day after surgery 5. It is common to experience some constipation if taking pain medication after surgery.  Increasing fluid intake and taking a stool softener will usually help or prevent this problem from occurring.  A mild laxative (Milk of Magnesia or Miralax) should be taken according to package directions if there are no bowel movements after 48 hours. 6. You may shower in 48 hours.  The surgical glue will flake off in 2-3 weeks.   7. ACTIVITIES:  No strenuous activity or heavy lifting for 1 week.   a. You may drive when you no longer are taking prescription pain medication, you can comfortably wear a seatbelt, and you can safely maneuver your car and apply brakes. b. RETURN TO WORK:  __________to be determined._______________ Dennis Bast should see your doctor in the office for a follow-up appointment approximately three-four weeks after your surgery.    WHEN TO CALL YOUR DOCTOR: 1. Fever over 101.0 2. Nausea and/or vomiting. 3. Extreme swelling or bruising. 4. Continued bleeding from incision. 5. Increased pain, redness, or drainage from the incision.  The clinic staff is available  to answer your questions during regular business hours.  Please dont hesitate to call and ask to speak to one of the nurses for clinical concerns.  If you have a medical emergency, go to the nearest emergency room or call 911.  A surgeon from [facility] is always on call at the hospital.  For further questions, please visit [email or web address]

## 2019-01-19 NOTE — Anesthesia Post-op Follow-up Note (Signed)
  Anesthesia Pain Follow-up Note  Patient: Leah Farmer  Day #: 4    Date of Follow-up: 01/19/2019 Time: 11:10 AM  Last Vitals:  Vitals:   01/19/19 0900 01/19/19 1000  BP: (!) 144/82 (!) 147/84  Pulse: (!) 116 (!) 120  Resp: (!) 27 13  Temp:    SpO2: 95% 96%    Level of Consciousness: alert  Pain: mild   Side Effects:None  Catheter Site Exam:clean, dry, no drainage  Epidural / Intrathecal (From admission, onward)   Start     Dose/Rate Route Frequency Ordered Stop   01/18/19 1700  ropivacaine (PF) 2 mg/mL (0.2%) (NAROPIN) injection     12 mL/hr 12 mL/hr  Epidural Continuous 01/18/19 1647         Plan: Catheter removed/tip intact at surgeon's request  Yani Lal DANIEL

## 2019-01-19 NOTE — Progress Notes (Signed)
   01/19/19 1200  Vitals  Temp 99.3 F (37.4 C)  Temp Source Oral  BP (!) 115/97  MAP (mmHg) 105  BP Location Right Arm  BP Method Automatic  Patient Position (if appropriate) Lying  Pulse Rate (!) 124  Pulse Rate Source Monitor  ECG Heart Rate (!) 124  Resp (!) 29  Level of Consciousness  Level of Consciousness Alert  Oxygen Therapy  SpO2 97 %  O2 Device Room Air  End [place] CO2 (EtCO2) 35

## 2019-01-19 NOTE — Consult Note (Signed)
Medical Consultation   Leah Farmer  [id number]  DOB: 12-06-1965  DOA: 01/15/2019  PCP: Margy Clarks, NP   Outpatient Specialists: Jocelyn Lamer - surgery   Requesting physician: Barry Dienes - surgery  Reason for consultation: Diabetic, previously on orals.  Multifocal neuroendocrine neoplasm of pancreas.  Likely to need insulin at time of d/c.  Likely also with HCAP - started with Vanc/Cefepime.    History of Present Illness: Leah Farmer is an 53 y.o. female with h/o NASH; HTN; DM; depression; and recent diagnosis of pancreatic cancer who was admitted on 12/15 for Whipple procedure (pancreaticoduodenectomy) and placement of biliary and pancreatic duct stents.  She has been having pain but otherwise feels well.  She has bene using her IS and has little cough.  Denies fever.  Prior to admission, she was on Janumet extended release with good control (150-170s).  Here it is not well controlled with insulin (now or during June hospitalization).  She has never bene on insulin as an outpatient.  She has right foot numbness with limited but improved ROM following anesthesia.    Review of Systems:  ROS As per HPI otherwise 10 point review of systems negative.    Past Medical History: Past Medical History:  Diagnosis Date  . Anxiety   . Arthritis 2020   lower back and hip; takes only OTC Motrin  . Cancer [facility]) 2020   pancreatic  . Depression    mild and situational; not medicated  . Diabetes mellitus without complication ([place])    unsure if type 1 or 2 , dx as an adult   . GERD (gastroesophageal reflux disease)   . History of kidney stones 07/2018   hospitalized at [facility]; passed on her own  . Hypertension   . NAFL (nonalcoholic fatty liver) 0000000   fatty liver seen on CT scan abd/ pelvis  . Nephrolithiasis    lov in june 2020 , also had covid at this time   . PONV (postoperative nausea and vomiting)   . Tachycardia    generally runs in the  120s    Past Surgical History: Past Surgical History:  Procedure Laterality Date  . APPENDECTOMY  in 20s  . CHOLECYSTECTOMY  in 20s  . ESOPHAGOGASTRODUODENOSCOPY (EGD) WITH PROPOFOL N/A 11/01/2018   Procedure: ESOPHAGOGASTRODUODENOSCOPY (EGD) WITH PROPOFOL;  Surgeon: Milus Banister, MD;  Location: [facility];  Service: Endoscopy;  Laterality: N/A;  . EUS N/A 11/01/2018   Procedure: UPPER ENDOSCOPIC ULTRASOUND (EUS) LINEAR;  Surgeon: Milus Banister, MD;  Location: [facility];  Service: Endoscopy;  Laterality: N/A;  . FINE NEEDLE ASPIRATION N/A 11/01/2018   Procedure: FINE NEEDLE ASPIRATION (FNA) LINEAR;  Surgeon: Milus Banister, MD;  Location: [facility];  Service: Endoscopy;  Laterality: N/A;  . LAPAROSCOPY N/A 01/15/2019   Procedure: LAPAROSCOPY DIAGNOSTIC;  Surgeon: Stark Klein, MD;  Location: [place];  Service: General;  Laterality: N/A;  . WHIPPLE PROCEDURE N/A 01/15/2019   Procedure: WHIPPLE PROCEDURE;  Surgeon: Stark Klein, MD;  Location: [place];  Service: General;  Laterality: N/A;     Allergies:   Allergies  Allergen Reactions  . Ciprofloxacin Hives     Social History:  reports that she has never smoked. She has never used smokeless tobacco. She reports that she does not drink alcohol or use drugs.   Family History: Family History  Problem Relation Age of Onset  . Hypertension Mother   .  Diabetes Mother   . Pancreatic cancer Mother   . Hypertension Father   . Diabetes Father   . Esophageal cancer Maternal Uncle        Passed at 24. Found colon cancer on colonoscopy at that time.   . Colon cancer Neg Hx   . Colon polyps Neg Hx       Physical Exam: Vitals:   01/19/19 1134 01/19/19 1200 01/19/19 1300 01/19/19 1400  BP:  (!) 115/97 140/80 (!) 142/90  Pulse:  (!) 124 (!) 121 (!) 128  Resp: (!) 35 (!) 29 13 19   Temp:  99.3 F (37.4 C)    TempSrc:  Oral    SpO2: 98% 97% 96% 97%  Weight:      Height:        Constitutional: Alert and awake,  oriented x3, appears somewhat somnolent and uncomfortable Eyes: EOMI, irises appear normal, anicteric sclera,  ENMT: external ears and nose appear normal, normal hearing, Lips appear normal Neck: neck appears normal, no masses, normal ROM, no thyromegaly, no JVD  CVS: S1-S2 clear, no murmur rubs or gallops, mild tachycardia Respiratory:  clear to auscultation bilaterally, no wheezing, rales or rhonchi. Respiratory effort normal.  Abdomen: large upper abdominal incision with bandage C/D/I; drains x 2 in RLQ Musculoskeletal: : no cyanosis, clubbing or edema noted bilaterally Neuro: Cranial nerves II-XII intact, inability to perform dorsal/plantar flexion although she does have lateral R foot movement now Psych: judgement and insight appear normal, flat mood and affect, mental status Skin: no rashes or lesions or ulcers, no induration or nodules    Data reviewed:  I have personally reviewed the recent labs and imaging studies  Pertinent Labs:   CO2 20 Glucose 253 Calcium 7.5 Phos 1.2 Albumin 2.8 WBC 30.5 Hgb 7.4 Blood cultures pending   Inpatient Medications:   Scheduled Meds: . chlorhexidine  15 mL Mouth Rinse BID  . Chlorhexidine Gluconate Cloth  6 each Topical Daily  . HYDROmorphone   Intravenous Q4H  . insulin aspart  0-20 Units Subcutaneous Q4H  . insulin glargine  10 Units Subcutaneous BID  . mouth rinse  15 mL Mouth Rinse q12n4p  . sodium chloride flush  10-40 mL Intracatheter Q12H   Continuous Infusions: . sodium chloride Stopped (01/16/19 1030)  . ceFEPime (MAXIPIME) IV 2 g (01/19/19 1502)  . methocarbamol (ROBAXIN) IV Stopped (01/16/19 1026)  . phenylephrine (NEO-SYNEPHRINE) Adult infusion 500 mcg/min (01/17/19 0125)  . ropivacaine (PF) 2 mg/mL (0.2%) 12 mL/hr (01/19/19 0408)  . sodium phosphate  Dextrose 5% IVPB 30 mmol (01/19/19 1319)  . vancomycin       Radiological Exams on Admission: DG CHEST PORT 1 VIEW  Result Date: 01/19/2019 CLINICAL DATA:   Leukocytosis EXAM: PORTABLE CHEST 1 VIEW COMPARISON:  01/15/2019 FINDINGS: Interval removal of enteric tube. Right IJ central venous catheter remains in place with distal tip terminating at the level of the SVC. Stable heart size. Interval development of left basilar airspace opacity. Probable small left pleural effusion. Right lung is clear. No pneumothorax. Surgical drains noted in the left upper abdominal quadrant. IMPRESSION: 1. Interval development of left basilar airspace opacity, which may represent atelectasis versus pneumonia. 2. Probable small left pleural effusion. Electronically Signed   By: Davina Poke M.D.   On: 01/19/2019 12:23    Impression/Recommendations Principal Problem:   Primary malignant neuroendocrine neoplasm of pancreas ([place]) Active Problems:   Diabetes mellitus type 2 in obese ([place])   HCAP (healthcare-associated pneumonia)   Essential hypertension  Depression  Pancreatic CA -Patient is s/p Whipple and pancreatectomy -Due to removal of significant volume of pancreatic tissue, she is likely to require insulin indefinitely -Management of cancer issues per surgery/Dr. Barry Dienes -R foot neurologic deficit likely associated with post-op epidural  DM -Patient was taking oral Janumet prior to admission -Now that she is s/p pancreatectomy, she is likely to need indefinite insulin -Glucose consistently in 200s, needs improved control -She received 47 units of SQ insulin yesterday in addition to Lantus 12 units -She has been ordered to have Lantus 10 units BID but this is likely still insufficient to meet her glycemic needs -Will monitor overnight on BID 10 units Lantus but anticipate that it will be reasonable to increase to 15 units BID tomorrow -Consider diabetes coordinator assistance, particularly as patient begins to advance her diet  HCAP -Persistent leukocytosis without specific complaints of source -CXR appears to indicate developing infiltrate -Agree with  addition of Cefepime/Vanc while pending cultures -Discontinue/change all available lines/tubes as appropriate  HTN -Holding home Zestoretic -Agree that prn Lopressor is reasonable  Depression -She has had a rough year - started with nephrolithiasis with concurrent COVID-19 infection; followed now by Whipple with pancreatic CA -Encourage [id number] sleep cycle, OOB when able -Consider psych consult inpatient vs. outpatient   Thank you for this consultation.  Our [facility] hospitalist team will follow the patient with you.   Time Spent: 61 minutes  Karmen Bongo M.D. [facility] 01/19/2019, 3:19 PM

## 2019-01-19 NOTE — Progress Notes (Signed)
4 Days Post-Op   Subjective/Chief Complaint: Tried clears and feels a bit bloated.  Passing a tiny bit of gas.    Objective: Vital signs in last 24 hours: Temp:  [97.8 F (36.6 C)-98.3 F (36.8 C)] 97.8 F (36.6 C) (12/19 0800) Pulse Rate:  [110-135] 120 (12/19 1000) Resp:  [13-35] 35 (12/19 1134) BP: (122-147)/(66-85) 147/84 (12/19 1000) SpO2:  [91 %-99 %] 98 % (12/19 1134) Last BM Date: (PTA)  Intake/Output from previous day: 12/18 0701 - 12/19 0700 In: 273.9 [P.O.:120; I.V.:10] Out: 1100 [Urine:820; Drains:280] Intake/Output this shift: Total I/O In: -  Out: 150 [Drains:150]  General appearance: alert, looks a bit better Resp: breathing comfortably Cardio: tachycardic but regular. GI: soft, non distended.  all three drains look like old blood.  Extremities: extremities normal, atraumatic, no cyanosis or edema  Lab Results:  Recent Labs    01/18/19 0530 01/19/19 0615  WBC 21.1* 30.5*  HGB 6.9* 7.4*  HCT 21.6* 23.9*  PLT 165 221   BMET Recent Labs    01/18/19 0530 01/19/19 0615  NA 143 139  K 4.0 4.0  CL 112* 108  CO2 19* 20*  GLUCOSE 214* 253*  BUN 26* 24*  CREATININE 1.03* 0.99  CALCIUM 7.3* 7.5*   PT/INR No results for input(s): LABPROT, INR in the last 72 hours. ABG No results for input(s): PHART, HCO3 in the last 72 hours.  Invalid input(s): PCO2, PO2  Studies/Results: No results found.  Anti-infectives: Anti-infectives (From admission, onward)   Start     Dose/Rate Route Frequency Ordered Stop   01/15/19 2000  ceFAZolin (ANCEF) IVPB 2g/100 mL premix     2 g 200 mL/hr over 30 Minutes Intravenous Every 8 hours 01/15/19 1712 01/15/19 2052   01/15/19 0600  ceFAZolin (ANCEF) IVPB 2g/100 mL premix     2 g 200 mL/hr over 30 Minutes Intravenous On call to O.R. 01/15/19 0549 01/15/19 1145      Assessment/Plan: s/p Procedure(s): WHIPPLE PROCEDURE (N/A) LAPAROSCOPY DIAGNOSTIC (N/A) DISTAL PANCREATECTOMY (N/A) Neuroendocrine cancer of the  pancreas.  Await pathology.  Multifocal.    ABL anemia -good response.   DM - increase lantus, but go to BID dosing.  Leukocytosis - get u/a, CXR, and blood cultures.  Check drains for amylase.    Acidosis - was likely related to blood loss/underresuscitation.  CO2 still coming back to normal.   AKI - resolved   Hypomagnesemia and hypophosphatemia - repleted,needs more phos today. Elevated LFTs - resolved  Epidural out. Still on PCA.  Try off PCA tomorrow if better bowel function  Stay on clear liquids given bloating.  Try full liquids in AM.    Dispo:  Stepdown if bed available.  Would not go to floor yet given tachycardia and leukocytosis.     LOS: 4 days    Stark Klein 01/19/2019

## 2019-01-19 NOTE — Progress Notes (Signed)
Pharmacy Antibiotic Note  Leah Farmer is a 53 y.o. female admitted on 01/15/2019 with pneumonia.  Pharmacy has been consulted for Vancomycin and Cefepime dosing.  Vancomycin 1500 mg IV Q 24 hrs. Goal AUC 400-550. Expected AUC: 486 SCr used: 0.99  Plan: Start Cefepime 2 gms IV q8hr Start Vancomycin 1500 mg IV q24hr Monitor renal function, clinical status, C&S and vanc levels as needed  Height: 5\' 4"  (162.6 cm) Weight: 252 lb (114.3 kg) IBW/kg (Calculated) : 54.7  Temp (24hrs), Avg:98.2 F (36.8 C), Min:97.8 F (36.6 C), Max:99.3 F (37.4 C)  Recent Labs  Lab 01/15/19 1622 01/16/19 0500 01/17/19 0440 01/18/19 0530 01/19/19 0615  WBC 23.0* 16.1* 22.5* 21.1* 30.5*  CREATININE 1.47* 1.55* 1.14* 1.03* 0.99    Estimated Creatinine Clearance: 82.4 mL/min (by C-G formula based on SCr of 0.99 mg/dL).    Allergies  Allergen Reactions  . Ciprofloxacin Hives    Antimicrobials this admission: Vanc 12/19 >> Cefepime 12/19 >>   Thank you for allowing pharmacy to be a part of this patient's care.  Alanda Slim, PharmD, [facility] Clinical Pharmacist Please see AMION for all Pharmacists' Contact Phone Numbers 01/19/2019, 1:54 PM

## 2019-01-19 NOTE — Progress Notes (Signed)
MD ordered blood cultures and port CXR.  WBC count elevated.  Patient has apparent early HCAP.  Antibx ordered.  Will obtain urinalysis, remove foley catheter, utilize purewick, and encourage incentive spirometry.  To be monitored closely.

## 2019-01-19 NOTE — Discharge Summary (Signed)
Physician Discharge Summary  Patient ID: Leah Farmer MRN: [id number] DOB/AGE: [birth year] 53 y.o.  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   Discharged Condition: serious  Hospital Course:  Pt was admitted to the ICU following dx laparoscopy, pancreaticoduodenectomy, and distal pancreatectomy 01/15/2019.  She was a bit underresuscitated at first and remained a bit acidotic.  She was given albumin and fluid boluses. She required neosynephrine at first, but this was able to wean to off.   She also required electrolyte repletion.  She had a very good epidural.  Her NGT was removed POD 2.  Her Hgb drifted down to 6 and she got 2 units of blood.  She was placed on lantus due to her hyperglycemia.  Her creatinine came back down to baseline.  She had significant leukocytosis post op.  This came back down a bit, but then went back up to 30.5k.  U/A was sent and was .  p CXR showed "left basilar opacity which may represent atelectasis vs pneumonia."  She was started on empiric broad spectrum antibiotics.  Medicine was consulted to to continued hyperglycemia despite increasing lantus doses.  She was switched to BID dosing.    CTs were ordered 12/20 and  showed some post op fluid in the surgical bed, but no enhancement.  She had an ileus and a left pleural effusion.   She developed increased shortness of breath and was reintubated 12/21.  Later that day, she had thoracentesis. She was able to extubate the following day.  She had high JP drainage and a very soft pancreas, so amylase was sent on drains.  This was high in all the drains.  Once the drainage became cloudy, her white count came down.  She was started on TNA due to prolonged ileus.  She was able to start mobilizing better.  Her antibiotics were narrowed.  She was placed on octreotide given then higher drain output.    She started having flatus and bowel movements and was hungry.  Her diet was advanced to soft.  Calorie counts were ordered.  TNA was weaned.  Her insulin was adjusted.  Staples were removed.    Her drains were all left in place for discharge given murky appearance.  She had drain teaching.  She was converted to oral antibiotics.    She was discharged to home in stable condition.     Consults: Medicine  Significant Diagnostic Studies: radiology: CTs  see above. Labs prior to d/c  WBCs down to 12.4k, HCT 29.9, Cr 0.68.     Treatments: surgery: see above. Antibiotics for pneumonia and pancreatic leak.  Thoracentesis x 2, reintubation, TNA for prolonged ileus.    Discharge Exam: Blood pressure 122/74, pulse 97, temperature 98.3 F (36.8 C), temperature source Oral, resp. rate 18, height _0  (1.651 m), weight 120.1 kg, last menstrual period 01/12/2019, SpO2 97 %.  General appearance: alert, cooperative and no distress Resp: breathing comfortably Cardio: regular rate and rhythm GI: soft, non distended, non tender.  drains murky.  chevron incision d/c/i.   Extremities: extremities normal, atraumatic, no cyanosis, tr edema  Disposition: Discharge disposition: 01-Home or Self Care        Allergies as of 01/31/2019      Reactions   Ciprofloxacin Hives       Medication List    TAKE these medications   amoxicillin-clavulanate 875-125 MG tablet Commonly known as: AUGMENTIN Take 1 tablet by mouth every 12 (twelve) hours.   BIOFREEZE ROLL-ON EX Apply 1 application topically 3 (three) times daily as needed (back pain.).   blood glucose meter kit and supplies Kit Dispense based on patient and insurance preference. Use up to four times daily as directed. (FOR ICD-9 250.00, 250.01).   clonazePAM 0.5 MG tablet Commonly known as: KLONOPIN Take 0.25-0.5 mg by mouth daily as needed for anxiety.   Cryselle-28 0.3-30 MG-MCG tablet Generic drug: norgestrel-ethinyl estradiol Take 1 tablet by mouth at bedtime.   feeding supplement (GLUCERNA SHAKE) Liqd Take 237 mLs by mouth 3 (three) times daily between meals.   ibuprofen 200 MG tablet Commonly known as: ADVIL Take 800 mg by mouth daily.   insulin glargine 100 UNIT/ML injection Commonly known as: LANTUS Inject 0.25 mLs (25 Units total) into the skin 2 (two) times daily.   Insulin Syringes (Disposable) U-100 1 ML Misc 1 Syringe by Does not apply route 2 (two) times daily.   Janumet XR [phone] MG Tb24 Generic drug: SitaGLIPtin-MetFORMIN HCl Take 1 tablet by mouth every evening.   lisinopril-hydrochlorothiazide 20-12.5 MG tablet Commonly known as: ZESTORETIC Take 1 tablet by mouth daily.   meclizine 12.5 MG tablet Commonly known as: ANTIVERT Take 12.5 mg by mouth 3 (three) times daily as needed for dizziness.   metoprolol succinate 50 MG 24 hr tablet Commonly known as: TOPROL-XL Take 50 mg by mouth at bedtime. Take with or immediately following a meal.   omeprazole 20 MG capsule Commonly known as: PRILOSEC Take 20 mg by mouth daily.   ondansetron 4 MG tablet Commonly known as: ZOFRAN Take 4 mg by mouth every 8 (eight) hours as needed for nausea or vomiting.   OVER THE COUNTER MEDICATION Take 650 mg by mouth every other day. Hydrangea Root (Hydrangea arborescens)   OVER THE  COUNTER MEDICATION Take 15 mLs by mouth daily. Apple cider vinegar with mother   oxyCODONE 5 MG immediate release tablet Commonly known as: Oxy IR/ROXICODONE Take 1-2 tablets (5-10 mg total) by mouth every 6 (six) hours as needed for moderate pain, severe pain or breakthrough pain.   pantoprazole 40 MG tablet Commonly known as: PROTONIX Take 1 tablet (40 mg total) by mouth daily at 12 noon.   prochlorperazine 10 MG tablet Commonly known as: COMPAZINE Take 1 tablet (10 mg total) by mouth every 6 (six) hours as needed for nausea or vomiting (Use for nausea and / or vomiting unresolved with ondansetron (Zofran).).   rosuvastatin 10 MG tablet Commonly known as: CRESTOR Take 10 mg by mouth at bedtime.   traMADol 50 MG tablet Commonly known as: ULTRAM Take 50 mg by mouth 2 (two) times daily as needed (kidney stone pain).   Vitamin D (Ergocalciferol) 1.25 MG (50000 UT) Caps capsule Commonly known as: DRISDOL Take 50,000 Units by mouth every Saturday.     ASK your doctor about these medications   insulin starter kit- pen needles Misc 1 kit  by Other route once for 1 dose. Ask about: Should I take this medication?      Follow-up Information    Stark Klein, MD Follow up in 2 week(s).   Specialty: General Surgery Contact information: [place] [address] [phone]        Margy Clarks, NP Follow up in 1 week(s).   Specialty: Family Medicine Contact information: [place] [phone] [facility] Follow up.   Why: Rolling walker and 3n1 arranged- to be delivered to room prior to discharge       [facility] [place] Follow up.   Why: HHRN/PT arranged- start of care by next Thursday Jan 7 they will call you to set up home visit Contact information: [place] [address] [phone]           Signed: Stark Klein 02/05/2019, 4:22 PM

## 2019-01-20 ENCOUNTER — Inpatient Hospital Stay (HOSPITAL_COMMUNITY): Payer: BC Managed Care – PPO

## 2019-01-20 ENCOUNTER — Encounter (HOSPITAL_COMMUNITY): Payer: Self-pay | Admitting: General Surgery

## 2019-01-20 DIAGNOSIS — K567 Ileus, unspecified: Secondary | ICD-10-CM

## 2019-01-20 DIAGNOSIS — K9189 Other postprocedural complications and disorders of digestive system: Secondary | ICD-10-CM

## 2019-01-20 DIAGNOSIS — R000 Tachycardia, unspecified: Secondary | ICD-10-CM

## 2019-01-20 DIAGNOSIS — I10 Essential (primary) hypertension: Secondary | ICD-10-CM

## 2019-01-20 DIAGNOSIS — E669 Obesity, unspecified: Secondary | ICD-10-CM

## 2019-01-20 DIAGNOSIS — E1169 Type 2 diabetes mellitus with other specified complication: Secondary | ICD-10-CM

## 2019-01-20 DIAGNOSIS — D72829 Elevated white blood cell count, unspecified: Secondary | ICD-10-CM

## 2019-01-20 LAB — MAGNESIUM: Magnesium: 2.3 mg/dL (ref 1.7–2.4)

## 2019-01-20 LAB — COMPREHENSIVE METABOLIC PANEL
ALT: 32 U/L (ref 0–44)
AST: 27 U/L (ref 15–41)
Albumin: 2.6 g/dL — ABNORMAL LOW (ref 3.5–5.0)
Alkaline Phosphatase: 82 U/L (ref 38–126)
Anion gap: 13 (ref 5–15)
BUN: 26 mg/dL — ABNORMAL HIGH (ref 6–20)
CO2: 18 mmol/L — ABNORMAL LOW (ref 22–32)
Calcium: 7.6 mg/dL — ABNORMAL LOW (ref 8.9–10.3)
Chloride: 106 mmol/L (ref 98–111)
Creatinine, Ser: 1.12 mg/dL — ABNORMAL HIGH (ref 0.44–1.00)
GFR calc Af Amer: >60 mL/min (ref >60)
GFR calc non Af Amer: 56 mL/min — ABNORMAL LOW (ref >60)
Glucose, Bld: 309 mg/dL — ABNORMAL HIGH (ref 70–99)
Potassium: 4.3 mmol/L (ref 3.5–5.1)
Sodium: 137 mmol/L (ref 135–145)
Total Bilirubin: 0.5 mg/dL (ref 0.3–1.2)
Total Protein: 6.1 g/dL — ABNORMAL LOW (ref 6.5–8.1)

## 2019-01-20 LAB — GLUCOSE, CAPILLARY
Glucose-Capillary: 275 mg/dL — ABNORMAL HIGH (ref 70–99)
Glucose-Capillary: 261 mg/dL — ABNORMAL HIGH (ref 70–99)
Glucose-Capillary: 296 mg/dL — ABNORMAL HIGH (ref 70–99)
Glucose-Capillary: 283 mg/dL — ABNORMAL HIGH (ref 70–99)
Glucose-Capillary: 223 mg/dL — ABNORMAL HIGH (ref 70–99)

## 2019-01-20 LAB — CBC
HCT: 26.1 % — ABNORMAL LOW (ref 36.0–46.0)
Hemoglobin: 8.1 g/dL — ABNORMAL LOW (ref 12.0–15.0)
MCH: 28.7 pg (ref 26.0–34.0)
MCHC: 31 g/dL (ref 30.0–36.0)
MCV: 92.6 fL (ref 80.0–100.0)
Platelets: 261 10*3/uL (ref 150–400)
RBC: 2.82 MIL/uL — ABNORMAL LOW (ref 3.87–5.11)
RDW: 14.9 % (ref 11.5–15.5)
WBC: 39.7 10*3/uL — ABNORMAL HIGH (ref 4.0–10.5)
nRBC: 1.8 % — ABNORMAL HIGH (ref 0.0–0.2)

## 2019-01-20 LAB — PHOSPHORUS: Phosphorus: 1.9 mg/dL — ABNORMAL LOW (ref 2.5–4.6)

## 2019-01-20 LAB — CALCIUM, IONIZED: Calcium, Ionized, Serum: 4.3 mg/dL — ABNORMAL LOW (ref 4.5–5.6)

## 2019-01-20 MED ORDER — FUROSEMIDE 10 MG/ML IJ SOLN
10.0000 mg | Freq: Once | INTRAMUSCULAR | Status: AC
  Administered 2019-01-20: 10 mg via INTRAVENOUS
  Filled 2019-01-20: qty 2

## 2019-01-20 MED ORDER — CLONAZEPAM 0.5 MG PO TBDP
0.5000 mg | ORAL_TABLET | Freq: Every day | ORAL | Status: DC | PRN
  Administered 2019-01-20 – 2019-01-29 (×4): 0.5 mg via ORAL
  Filled 2019-01-20 (×4): qty 1

## 2019-01-20 MED ORDER — ONDANSETRON HCL 4 MG/2ML IJ SOLN
INTRAMUSCULAR | Status: AC
  Administered 2019-01-20: 4 mg
  Filled 2019-01-20: qty 2

## 2019-01-20 MED ORDER — CLONAZEPAM 0.5 MG PO TABS: 0.2500 mg | ORAL_TABLET | Freq: Every day | ORAL | Status: DC | PRN

## 2019-01-20 MED ORDER — DIPHENHYDRAMINE HCL 50 MG/ML IJ SOLN: 12.5000 mg | Freq: Four times a day (QID) | INTRAMUSCULAR | Status: DC | PRN

## 2019-01-20 MED ORDER — IPRATROPIUM BROMIDE 0.02 % IN SOLN
0.5000 mg | Freq: Four times a day (QID) | RESPIRATORY_TRACT | Status: DC
  Administered 2019-01-21 – 2019-01-25 (×20): 0.5 mg via RESPIRATORY_TRACT
  Filled 2019-01-20 (×20): qty 2.5

## 2019-01-20 MED ORDER — METOPROLOL TARTRATE 25 MG PO TABS
25.0000 mg | ORAL_TABLET | Freq: Two times a day (BID) | ORAL | Status: DC
  Administered 2019-01-20 – 2019-01-22 (×6): 25 mg via ORAL
  Filled 2019-01-20 (×7): qty 1

## 2019-01-20 MED ORDER — LEVALBUTEROL HCL 0.63 MG/3ML IN NEBU
0.6300 mg | INHALATION_SOLUTION | Freq: Four times a day (QID) | RESPIRATORY_TRACT | Status: DC
  Administered 2019-01-21 – 2019-01-25 (×20): 0.63 mg via RESPIRATORY_TRACT
  Filled 2019-01-20 (×20): qty 3

## 2019-01-20 MED ORDER — IOHEXOL 300 MG/ML  SOLN
100.0000 mL | Freq: Once | INTRAMUSCULAR | Status: AC | PRN
  Administered 2019-01-20: 100 mL via INTRAVENOUS

## 2019-01-20 MED ORDER — FENTANYL 40 MCG/ML IV SOLN
INTRAVENOUS | Status: DC
  Administered 2019-01-20: 195 ug via INTRAVENOUS
  Administered 2019-01-20: 45 ug via INTRAVENOUS
  Filled 2019-01-20: qty 1000

## 2019-01-20 MED ORDER — DIPHENHYDRAMINE HCL 12.5 MG/5ML PO ELIX: 12.5000 mg | ORAL_SOLUTION | Freq: Four times a day (QID) | ORAL | Status: DC | PRN

## 2019-01-20 MED ORDER — LEVALBUTEROL HCL 0.63 MG/3ML IN NEBU
INHALATION_SOLUTION | RESPIRATORY_TRACT | Status: AC
  Administered 2019-01-20: 0.63 mg via RESPIRATORY_TRACT
  Filled 2019-01-20: qty 3

## 2019-01-20 MED ORDER — INSULIN GLARGINE 100 UNIT/ML ~~LOC~~ SOLN
16.0000 [IU] | Freq: Two times a day (BID) | SUBCUTANEOUS | Status: DC
  Administered 2019-01-20 – 2019-01-22 (×5): 16 [IU] via SUBCUTANEOUS
  Filled 2019-01-20 (×6): qty 0.16

## 2019-01-20 MED ORDER — IPRATROPIUM BROMIDE 0.02 % IN SOLN
RESPIRATORY_TRACT | Status: AC
  Administered 2019-01-20: 0.5 mg via RESPIRATORY_TRACT
  Filled 2019-01-20: qty 2.5

## 2019-01-20 MED ORDER — ALBUTEROL SULFATE (2.5 MG/3ML) 0.083% IN NEBU
2.5000 mg | INHALATION_SOLUTION | Freq: Once | RESPIRATORY_TRACT | Status: AC
  Administered 2019-01-20: 2.5 mg via RESPIRATORY_TRACT
  Filled 2019-01-20: qty 3

## 2019-01-20 MED ORDER — SODIUM PHOSPHATES 45 MMOLE/15ML IV SOLN
30.0000 mmol | Freq: Once | INTRAVENOUS | Status: AC
  Administered 2019-01-20: 30 mmol via INTRAVENOUS
  Filled 2019-01-20 (×2): qty 10

## 2019-01-20 MED ORDER — PANTOPRAZOLE SODIUM 40 MG IV SOLR
40.0000 mg | INTRAVENOUS | Status: DC
  Administered 2019-01-20 – 2019-01-21 (×2): 40 mg via INTRAVENOUS
  Filled 2019-01-20 (×2): qty 40

## 2019-01-20 MED ORDER — ALBUMIN HUMAN 25 % IV SOLN
25.0000 g | Freq: Four times a day (QID) | INTRAVENOUS | Status: AC
  Administered 2019-01-20 – 2019-01-22 (×8): 25 g via INTRAVENOUS
  Filled 2019-01-20: qty 100
  Filled 2019-01-20: qty 50
  Filled 2019-01-20 (×4): qty 100
  Filled 2019-01-20: qty 50
  Filled 2019-01-20: qty 100

## 2019-01-20 NOTE — Progress Notes (Signed)
Patient complaining of tightness in chest and "talking out of her mind" along with SOB.   Per General Surgery, tightness is to be expected, continue working on pulmonary toilet.  Dilaudid PCA is likely causing side effects, change to Fentanyl PCA per General Surgery verbal order.

## 2019-01-20 NOTE — Progress Notes (Addendum)
PROGRESS NOTE   Leah Farmer  [id number]    DOB: March 10, 1965    DOA: 01/15/2019  PCP: Margy Clarks, NP   I have briefly reviewed patients previous medical records in [facility].  Chief Complaint:  Postop abdominal distention & pain, cough and dyspnea.  Brief Narrative:  53 year old married female, PMH of type II DM, HTN, GERD, anxiety and depression, NAFL, primary malignant neuroendocrine tumor of the pancreas x2, s/p diagnostic laparoscopy, classic pancreatico duodenectomy, distal pancreatectomy, placement of biliary and pancreatic duct stents by CCS on 12/15, TRH was consulted on 12/16 to assist with management of uncontrolled DM and pneumonia.  Postop course complicated by postop ileus, sinus tachycardia, expected acute blood loss anemia and electrolyte abnormalities.   Assessment & Plan:  Principal Problem:   Primary malignant neuroendocrine neoplasm of pancreas [facility]) Active Problems:   Diabetes mellitus type 2 in obese ([place])   HCAP (healthcare-associated pneumonia)   Essential hypertension   Depression   Uncontrolled type II DM with hyperglycemia  A1c 7.5 on 12/11 suggesting poor outpatient control.  Was on Janumet XR (sitagliptin-Metformin) PTA, currently on hold.  Agree with increasing Lantus to 16 units twice daily, continue NovoLog SSI/resistant scale every 4 hourly.  Monitor closely and adjust insulins as needed.  Will likely need insulins at discharge.  Consulted diabetes coordinator for counseling and assistance in management.  Essential hypertension  Mildly uncontrolled at times.  Patient was on Toprol-XL 50 mg nightly PTA.  Current sinus tachycardia may be a combination of acute illness, pain, infection and some beta-blocker withdrawal.  Resumed metoprolol 25 mg twice daily.  Discussed with Dr. Barry Dienes, agrees.  Continue to hold prior home dose of lisinopril/HCTZ.  Sinus tachycardia  Heart rates in the 130s this morning.  Multifactorial as  noted above.  Treat underlying conditions as noted and resume beta-blockers.  Continue to monitor on telemetry.  EKG 12/20: Sinus tachycardia at 138 bpm,?  Incomplete LBBB, possible LVH with repolarization abnormality.  QTc 548 ms.  Suspected pneumonia  Chest x-ray 12/19 showed developing left basilar airspace opacity, atelectasis versus pneumonia.  CT chest with contrast 12/20: Moderate left pleural effusion with associated left lower lobe atelectasis and mild to moderate right basilar atelectasis.  Although chest x-ray yesterday suggested pneumonia, none on CT today.  Has marked leukocytosis/39.7K.  Reasonable to continue IV cefepime and vancomycin for dual indication including suspected pneumonia and postop abdominal coverage.  Aggressive pulmonary toilet/incentive spirometry.  Acute respiratory failure with hypoxia  Secondary to pneumonia, atelectasis and pleural effusions.  Aggressive pulmonary toilet and oxygen supplementation as needed to maintain saturations >92%.  Patient may have underlying OSA/OHS which can be evaluated as outpatient.  Postop ileus, s/p pancreaticoduodenectomy 12/15 for primary malignant neuroendocrine pancreatic tumor  Mobilize, minimize opioids as able, advance diet as tolerated.  CT abdomen and pelvis with contrast 12/20: Pancreaticoduodenectomy and hepaticojejunostomy.  3.6 x 7.6 x 4.6 postop collection at site of resected pancreatic head without well-defined capsular gas.  Likely small bowel ileus.  Postop acute blood loss anemia  Hemoglobin as low as 6.9.  Suspect that she was transfused.  Hemoglobin up to 8.1.  Follow CBC daily.  Leukocytosis  Suspect multifactorial.  Stress response, pneumonia, remains on IV antibiotics as above.  CT abdomen and pelvis results as noted above.  Does have a small operative site collection-deferred to CCS regarding further intervention.  Covid 19 testing negative on 12/11.  MRSA PCR negative.  Urine  microscopy 12/19 not indicative of UTI.  Blood  cultures x2: Pending.  Follow CBCs.  Acute kidney injury  Postop day 1, creatinine peaked at 1.55.  Resolved.  Continue IV fluid hydration follow BMP daily.  Hypomagnesemia  Replaced  Hypophosphatemia  Replace and follow as needed.  Metabolic acidosis  Initially had anion gap metabolic acidosis with bicarb as low as 11 and anion gap of 21.  Now mild non-anion gap metabolic acidosis with bicarbonate of 18.  Follow daily BMP.  Prolonged QTC  EKG 12/20: QTC 548 ms.?  Incomplete BBB may cause inaccurate reading.  Potassium >4, magnesium >2.  Minimize medications that may prolong QTC.  Body mass index is 43.26 kg/m./Morbid obesity  Hyperlipidemia  Resume Crestor when able to consistently tolerate diet well.  GERD  Resume PPI.  Anxiety  Resume home dose of as needed clonazepam.  2 small groundglass opacities right lung apex  Noted on CT chest 12/20.  Will need follow-up.  Abnormal LFTs  Improving.  Mild fatty liver on CT abdomen.  Right foot drop  ?  Related to epidural, improving.   DVT prophylaxis: SCDs Code Status: Full Family Communication: None at bedside. Disposition: To be determined pending clinical improvement.  Agree with continued management in intensive care unit because patient's overall condition is still tenuous and at risk for decline.   Consultants:   TRH are consultants.  Procedures:     s/p diagnostic laparoscopy, classic pancreatico duodenectomy, distal pancreatectomy, placement of biliary and pancreatic duct stents by CCS on 12/15  Has 2 abdominal drains on the right and one on the left.  Right IJ central line and Foley catheter have been discontinued.  Antimicrobials:   IV cefepime and vancomycin 12/19 >   Subjective:  Patient interviewed and examined along with her female RN in room.  Reports ongoing abdominal distention and pain, rated at 6/10 in severity, worse after  epidural discontinued yesterday.  Pain better controlled with Dilaudid PCA.  To me she denied passing flatus or BM since surgery but reportedly has passed small flatus per CCS.  Dry cough.  Mild dyspnea but no chest pain.  Objective:   Vitals:   01/20/19 0409 01/20/19 0500 01/20/19 0700 01/20/19 0919  BP:  (!) 155/127 118/89   Pulse:  (!) 135 (!) 137   Resp: 12 12 13    Temp:      TempSrc:      SpO2: (!) 84% 97% 94% 98%  Weight:      Height:        General exam: Young female, moderately built and morbidly obese, ill looking lying propped up in bed, uncomfortable but in no obvious respiratory or painful distress Respiratory system: Reduced breath sounds in the bases with occasional basal crackles.  Rest of lung fields clear to auscultation without wheezing or rhonchi.  No increased work of breathing.  On nasal cannula oxygen. Cardiovascular system: S1 & S2 heard, regular tachycardia. No JVD, murmurs, rubs, gallops or clicks.  Trace bilateral ankle edema.  Telemetry personally reviewed: Sinus tachycardia in the 130s. Gastrointestinal system: Abdomen is distended, large Y-shaped upper abdominal surgical incision site with staples intact and no acute findings.  2 right-sided and 1 left-sided abdominal drains.  Appropriate mild diffuse tenderness without rigidity, guarding or rebound.  Diminished bowel sounds throughout. Central nervous system: Alert and oriented. No focal neurological deficits. Extremities: Symmetric 5 x 5 power.  Mild right foot drop but reportedly improving. Skin: No rashes, lesions or ulcers Psychiatry: Judgement and insight appear normal. Mood & affect appropriate.  Data Reviewed:   I have personally reviewed following labs and imaging studies   CBC: Recent Labs  Lab 01/18/19 0530 01/19/19 0615 01/20/19 0623  WBC 21.1* 30.5* 39.7*  HGB 6.9* 7.4* 8.1*  HCT 21.6* 23.9* 26.1*  MCV 90.4 94.5 92.6  PLT 165 221 0000000    Basic Metabolic Panel: Recent Labs  Lab  01/18/19 0530 01/19/19 0615 01/20/19 0623  NA 143 139 137  K 4.0 4.0 4.3  CL 112* 108 106  CO2 19* 20* 18*  GLUCOSE 214* 253* 309*  BUN 26* 24* 26*  CREATININE 1.03* 0.99 1.12*  CALCIUM 7.3* 7.5* 7.6*  MG 2.8* 2.4 2.3  PHOS 1.5* 1.2* 1.9*    Liver Function Tests: Recent Labs  Lab 01/18/19 0530 01/19/19 0615 01/20/19 0623  AST 43* 25 27  ALT 58* 42 32  ALKPHOS 44 54 82  BILITOT 0.7 0.5 0.5  PROT 5.9* 5.9* 6.1*  ALBUMIN 3.2* 2.8* 2.6*    CBG: Recent Labs  Lab 01/19/19 2319 01/20/19 0353 01/20/19 0825  GLUCAP 200* 275* 25*    Microbiology Studies:   Recent Results (from the past 240 hour(s))  Novel Coronavirus, NAA (Hosp order, Send-out to Ref Lab; TAT 18-24 hrs     Status: None   Collection Time: 01/11/19 12:42 PM   Specimen: Nasopharyngeal Swab; Respiratory  Result Value Ref Range Status   SARS-CoV-2, NAA NOT DETECTED NOT DETECTED Final    Comment: (NOTE) This nucleic acid amplification test was developed and its performance characteristics determined by [employer]. Nucleic acid amplification tests include PCR and TMA. This test has not been FDA cleared or approved. This test has been authorized by FDA under an Emergency Use Authorization (EUA). This test is only authorized for the duration of time the declaration that circumstances exist justifying the authorization of the emergency use of in vitro diagnostic tests for detection of SARS-CoV-2 virus and/or diagnosis of COVID-19 infection under section 564(b)(1) of the Act, 21 U.S.C. [id number]) (1), unless the authorization is terminated or revoked sooner. When diagnostic testing is negative, the possibility of a false negative result should be considered in the context of a patient's recent exposures and the presence of clinical signs and symptoms consistent with COVID-19. An individual without symptoms of COVID- 19 and who is not shedding SARS-CoV-2 vi rus would expect to have a negative  (not detected) result in this assay. Performed At: [facility] [address] [place], [place] [id number] Rush Farmer MD [id number]    [place]  Final    Comment: Performed at [facility], [place] [address], [place], [place] [address]  MRSA PCR Screening     Status: None   Collection Time: 01/15/19  5:33 PM   Specimen: Nasal Mucosa; Nasopharyngeal  Result Value Ref Range Status   MRSA by PCR NEGATIVE NEGATIVE Final    Comment:        The GeneXpert MRSA Assay (FDA approved for NASAL specimens only), is one component of a comprehensive MRSA colonization surveillance program. It is not intended to diagnose MRSA infection nor to guide or monitor treatment for MRSA infections. Performed at [facility], [place] [address] [place], [place] [address]      Radiology Studies:  CT CHEST W CONTRAST  Result Date: 01/20/2019 CLINICAL DATA:  53 year old female with abdominal and pelvic distension, pain and nausea, status post Whipple procedure for pancreatic neuroendocrine tumor bone 01/15/2019. LEFT pleural effusion on chest radiograph. EXAM: CT CHEST, ABDOMEN, AND  PELVIS WITH CONTRAST TECHNIQUE: Multidetector CT imaging of the chest, abdomen and pelvis was performed following the standard protocol during bolus administration of intravenous contrast. CONTRAST:  162mL OMNIPAQUE IOHEXOL 300 MG/ML  SOLN COMPARISON:  01/09/2019 PET CT and prior studies. 01/19/2019 chest radiograph FINDINGS: CT CHEST FINDINGS Cardiovascular: Heart size is normal. No thoracic aortic abnormality identified. No pericardial effusion. Mediastinum/Nodes: No enlarged mediastinal, hilar, or axillary lymph nodes. Thyroid gland, trachea, and esophagus demonstrate no significant findings. Lungs/Pleura: A moderate LEFT pleural effusion is noted with associated LEFT LOWER lobe atelectasis. Mild-to-moderate subsegmental atelectasis within the RIGHT LOWER lobe noted. Two small areas of  ground-glass opacity within the RIGHT lung apex are noted (series for: Images 20 3-32) are nonspecific. There is no evidence of pneumothorax or RIGHT pleural effusion. Musculoskeletal: No acute or suspicious bony abnormalities are identified. CT ABDOMEN PELVIS FINDINGS Hepatobiliary: Probable mild hepatic steatosis identified. A stent/catheter within what appears to be RIGHT hepatic/CBD to the hepaticojejunostomy site noted. No biliary dilatation is identified. Patient is status post cholecystectomy. Pancreas: Pancreaticoduodenectomy changes noted with 2 percutaneous drains in this region. A 3.6 x 7.6 x 4.6 cm collection at the site of the resected pancreatic head is noted, without well-defined capsule. A small amount of adjacent fluid along the operative site is noted. No evidence of gas collection in this area. A stent/catheter in the remaining portion of the pancreas/pancreatic duct is noted. Spleen: No significant abnormality. Adrenals/Urinary Tract: The kidneys, adrenal glands and bladder are unremarkable. Stomach/Bowel: Mildly distended gas-filled loops of colon and small bowel are noted, likely ileus. No other significant abnormality identified. No definite extraluminal oral contrast identified. Vascular/Lymphatic: Aortic atherosclerosis. No enlarged abdominal or pelvic lymph nodes. Reproductive: Uterus and bilateral adnexa are unremarkable. Other: A small amount of ascites within the pelvis is noted. Small foci of pneumoperitoneum are not unexpected postoperatively. Moderate subcutaneous edema along the LATERAL aspects of the abdomen pelvis, LEFT-greater-than-RIGHT noted. Musculoskeletal: No acute or suspicious bony abnormalities. Degenerative changes in the lumbar spine and RIGHT hip are again identified. IMPRESSION: 1. Pancreaticoduodenectomy and hepaticojejunostomy changes as described. 3.6 x 7.6 x 4.6 cm postoperative collection at the site of the resected pancreatic head without well-defined capsule or  gas. Surgical drain/catheters as described. 2. Moderate LEFT pleural effusion with associated LEFT LOWER lobe atelectasis and mild to moderate RIGHT basilar atelectasis. 3. Mildly distended gas-filled loops of colon and small bowel-likely ileus. 4. Small amount of pelvic free fluid. Subcutaneous edema along the LATERAL abdomen and pelvis. 5. Two small ground-glass opacities within the RIGHT lung apex, nonspecific. 6. Probable mild hepatic steatosis Electronically Signed   By: Margarette Canada M.D.   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 LEFT pleural effusion on chest radiograph. EXAM: CT CHEST, ABDOMEN, AND PELVIS WITH CONTRAST TECHNIQUE: Multidetector CT imaging of the chest, abdomen and pelvis was performed following the standard protocol during bolus administration of intravenous contrast. CONTRAST:  127mL OMNIPAQUE IOHEXOL 300 MG/ML  SOLN COMPARISON:  01/09/2019 PET CT and prior studies. 01/19/2019 chest radiograph FINDINGS: CT CHEST FINDINGS Cardiovascular: Heart size is normal. No thoracic aortic abnormality identified. No pericardial effusion. Mediastinum/Nodes: No enlarged mediastinal, hilar, or axillary lymph nodes. Thyroid gland, trachea, and esophagus demonstrate no significant findings. Lungs/Pleura: A moderate LEFT pleural effusion is noted with associated LEFT LOWER lobe atelectasis. Mild-to-moderate subsegmental atelectasis within the  RIGHT LOWER lobe noted. Two small areas of ground-glass opacity within the RIGHT lung apex are noted (series for: Images 20 3-32) are nonspecific. There is no evidence of pneumothorax or RIGHT pleural effusion. Musculoskeletal: No acute or suspicious bony abnormalities are identified. CT ABDOMEN PELVIS FINDINGS Hepatobiliary: Probable mild hepatic steatosis  identified. A stent/catheter within what appears to be RIGHT hepatic/CBD to the hepaticojejunostomy site noted. No biliary dilatation is identified. Patient is status post cholecystectomy. Pancreas: Pancreaticoduodenectomy changes noted with 2 percutaneous drains in this region. A 3.6 x 7.6 x 4.6 cm collection at the site of the resected pancreatic head is noted, without well-defined capsule. A small amount of adjacent fluid along the operative site is noted. No evidence of gas collection in this area. A stent/catheter in the remaining portion of the pancreas/pancreatic duct is noted. Spleen: No significant abnormality. Adrenals/Urinary Tract: The kidneys, adrenal glands and bladder are unremarkable. Stomach/Bowel: Mildly distended gas-filled loops of colon and small bowel are noted, likely ileus. No other significant abnormality identified. No definite extraluminal oral contrast identified. Vascular/Lymphatic: Aortic atherosclerosis. No enlarged abdominal or pelvic lymph nodes. Reproductive: Uterus and bilateral adnexa are unremarkable. Other: A small amount of ascites within the pelvis is noted. Small foci of pneumoperitoneum are not unexpected postoperatively. Moderate subcutaneous edema along the LATERAL aspects of the abdomen pelvis, LEFT-greater-than-RIGHT noted. Musculoskeletal: No acute or suspicious bony abnormalities. Degenerative changes in the lumbar spine and RIGHT hip are again identified. IMPRESSION: 1. Pancreaticoduodenectomy and hepaticojejunostomy changes as described. 3.6 x 7.6 x 4.6 cm postoperative collection at the site of the resected pancreatic head without well-defined capsule or gas. Surgical drain/catheters as described. 2. Moderate LEFT pleural effusion with associated LEFT LOWER lobe atelectasis and mild to moderate RIGHT basilar atelectasis. 3. Mildly distended gas-filled loops of colon and small bowel-likely ileus. 4. Small amount of pelvic free fluid. Subcutaneous edema along the  LATERAL abdomen and pelvis. 5. Two small ground-glass opacities within the RIGHT lung apex, nonspecific. 6. Probable mild hepatic steatosis Electronically Signed   By: Margarette Canada M.D.   On: 01/20/2019 10:42     Scheduled Meds:   . chlorhexidine  15 mL Mouth Rinse BID  . Chlorhexidine Gluconate Cloth  6 each Topical Daily  . HYDROmorphone   Intravenous Q4H  . insulin aspart  0-20 Units Subcutaneous Q4H  . insulin glargine  16 Units Subcutaneous BID  . mouth rinse  15 mL Mouth Rinse q12n4p  . sodium chloride flush  10-40 mL Intracatheter Q12H    Continuous Infusions:   . sodium chloride Stopped (01/16/19 1030)  . albumin human 25 g (01/20/19 0811)  . ceFEPime (MAXIPIME) IV 2 g (01/20/19 0552)  . methocarbamol (ROBAXIN) IV Stopped (01/16/19 1026)  . phenylephrine (NEO-SYNEPHRINE) Adult infusion 500 mcg/min (01/17/19 0125)  . ropivacaine (PF) 2 mg/mL (0.2%) Stopped (01/19/19 1052)  . sodium phosphate  Dextrose 5% IVPB    . vancomycin 1,500 mg (01/19/19 1549)     LOS: 5 days     Vernell Leep, MD, [place], [facility]. [facility]    To contact the attending provider between 7A-7P or the covering provider during after hours 7P-7A, please log into the web site [email or web address] and access using universal [place] password for that web site. If you do not have the password, please call the hospital operator.  01/20/2019, 11:30 AM

## 2019-01-20 NOTE — Progress Notes (Addendum)
Patient working hard to breathe, excessive wheezing. General surgery gave verbal order for albuterol nebulizer.

## 2019-01-20 NOTE — Progress Notes (Signed)
Wasted 20mL dilaudid in med room with Budd Palmer RN.

## 2019-01-20 NOTE — Progress Notes (Signed)
5 Days Post-Op   Subjective/Chief Complaint: HR still high.  Started antibiotics yesterday.  Still feels puny.  Tolerating clears.  No n/v.  Passing some gas.  No BM yet.    Objective: Vital signs in last 24 hours: Temp:  [97.8 F (36.6 C)-99.3 F (37.4 C)] 98.1 F (36.7 C) (12/20 0400) Pulse Rate:  [116-135] 135 (12/20 0500) Resp:  [12-36] 12 (12/20 0500) BP: (102-155)/(76-127) 155/127 (12/20 0500) SpO2:  [84 %-98 %] 97 % (12/20 0500) Last BM Date: 01/14/19  Intake/Output from previous day: 12/19 0701 - 12/20 0700 In: 2856.5 [P.O.:2104; I.V.:30; IV Piggyback:722.5] Out: 1075 [Urine:775; Drains:300] Intake/Output this shift: No intake/output data recorded.  General appearance: alert, a little sweaty. Resp: breathing comfortably but a little fast. Cardio: tachycardic but regular. GI: soft, non distended.  Drains lightening up.  serosang now.  Drain #2 isn't putting out much.   Extremities: extremities normal, atraumatic, no cyanosis. Tr edema   Lab Results:  Recent Labs    01/19/19 0615 01/20/19 0623  WBC 30.5* 39.7*  HGB 7.4* 8.1*  HCT 23.9* 26.1*  PLT 221 261   BMET Recent Labs    01/19/19 0615 01/20/19 0623  NA 139 137  K 4.0 4.3  CL 108 106  CO2 20* 18*  GLUCOSE 253* 309*  BUN 24* 26*  CREATININE 0.99 1.12*  CALCIUM 7.5* 7.6*   PT/INR No results for input(s): LABPROT, INR in the last 72 hours. ABG No results for input(s): PHART, HCO3 in the last 72 hours.  Invalid input(s): PCO2, PO2  Studies/Results: DG CHEST PORT 1 VIEW  Result Date: 01/19/2019 CLINICAL DATA:  Leukocytosis EXAM: PORTABLE CHEST 1 VIEW COMPARISON:  01/15/2019 FINDINGS: Interval removal of enteric tube. Right IJ central venous catheter remains in place with distal tip terminating at the level of the SVC. Stable heart size. Interval development of left basilar airspace opacity. Probable small left pleural effusion. Right lung is clear. No pneumothorax. Surgical drains noted in the  left upper abdominal quadrant. IMPRESSION: 1. Interval development of left basilar airspace opacity, which may represent atelectasis versus pneumonia. 2. Probable small left pleural effusion. Electronically Signed   By: Davina Poke M.D.   On: 01/19/2019 12:23    Anti-infectives: Anti-infectives (From admission, onward)   Start     Dose/Rate Route Frequency Ordered Stop   01/19/19 1430  vancomycin (VANCOREADY) IVPB 1500 mg/300 mL     1,500 mg 150 mL/hr over 120 Minutes Intravenous Every 24 hours 01/19/19 1351     01/19/19 1400  ceFEPIme (MAXIPIME) 2 g in sodium chloride 0.9 % 100 mL IVPB     2 g 200 mL/hr over 30 Minutes Intravenous Every 8 hours 01/19/19 1349     01/15/19 2000  ceFAZolin (ANCEF) IVPB 2g/100 mL premix     2 g 200 mL/hr over 30 Minutes Intravenous Every 8 hours 01/15/19 1712 01/15/19 2052   01/15/19 0600  ceFAZolin (ANCEF) IVPB 2g/100 mL premix     2 g 200 mL/hr over 30 Minutes Intravenous On call to O.R. 01/15/19 0549 01/15/19 1145      Assessment/Plan: s/p Procedure(s): WHIPPLE PROCEDURE (N/A) LAPAROSCOPY DIAGNOSTIC (N/A) DISTAL PANCREATECTOMY (N/A) Neuroendocrine cancer of the pancreas.  Await pathology.  Multifocal.    ABL anemia -good response.   DM - increase lantus.  TRH to follow Leukocytosis - CXR suspicious for pneumonia.  Vanc/cefepime started yesterday. U/a negative.  Blood cx pending.  Given continued leukocytosis will get CT C/A/P.  Check drains for amylase.  May have undrained fluid collection Acidosis - was likely related to blood loss/underresuscitation.  CO2 still coming back to normal.   AKI - resolved, but cr back up just a bit.   Hypomagnesemia and hypophosphatemia - repleted, needs more phos again today. Elevated LFTs - resolved  Full liquids today.      Dispo:  Leave in ICU. Would not go to floor yet given tachycardia and leukocytosis.  CTs pending.  Pt a bit tenuous.      LOS: 5 days    Stark Klein 01/20/2019

## 2019-01-21 ENCOUNTER — Inpatient Hospital Stay (HOSPITAL_COMMUNITY): Payer: BC Managed Care – PPO

## 2019-01-21 ENCOUNTER — Inpatient Hospital Stay (HOSPITAL_COMMUNITY): Payer: BC Managed Care – PPO | Admitting: Anesthesiology

## 2019-01-21 ENCOUNTER — Inpatient Hospital Stay: Payer: Self-pay

## 2019-01-21 DIAGNOSIS — J9601 Acute respiratory failure with hypoxia: Secondary | ICD-10-CM

## 2019-01-21 DIAGNOSIS — J90 Pleural effusion, not elsewhere classified: Secondary | ICD-10-CM

## 2019-01-21 HISTORY — PX: IR THORACENTESIS ASP PLEURAL SPACE W/IMG GUIDE: IMG5380

## 2019-01-21 LAB — TRIGLYCERIDES: Triglycerides: 256 mg/dL — ABNORMAL HIGH (ref <150)

## 2019-01-21 LAB — GLUCOSE, CAPILLARY
Glucose-Capillary: 134 mg/dL — ABNORMAL HIGH (ref 70–99)
Glucose-Capillary: 231 mg/dL — ABNORMAL HIGH (ref 70–99)
Glucose-Capillary: 199 mg/dL — ABNORMAL HIGH (ref 70–99)
Glucose-Capillary: 203 mg/dL — ABNORMAL HIGH (ref 70–99)
Glucose-Capillary: 230 mg/dL — ABNORMAL HIGH (ref 70–99)
Glucose-Capillary: 270 mg/dL — ABNORMAL HIGH (ref 70–99)

## 2019-01-21 LAB — TYPE AND SCREEN
ABO/RH(D): O POS
Antibody Screen: NEGATIVE
Unit division: 0
Unit division: 0
Unit division: 0
Unit division: 0

## 2019-01-21 LAB — CBC
HCT: 24 % — ABNORMAL LOW (ref 36.0–46.0)
Hemoglobin: 7.3 g/dL — ABNORMAL LOW (ref 12.0–15.0)
MCH: 28.7 pg (ref 26.0–34.0)
MCHC: 30.4 g/dL (ref 30.0–36.0)
MCV: 94.5 fL (ref 80.0–100.0)
Platelets: 260 10*3/uL (ref 150–400)
RBC: 2.54 MIL/uL — ABNORMAL LOW (ref 3.87–5.11)
RDW: 15.2 % (ref 11.5–15.5)
WBC: 30.2 10*3/uL — ABNORMAL HIGH (ref 4.0–10.5)
nRBC: 2.8 % — ABNORMAL HIGH (ref 0.0–0.2)

## 2019-01-21 LAB — COMPREHENSIVE METABOLIC PANEL
ALT: 24 U/L (ref 0–44)
AST: 26 U/L (ref 15–41)
Albumin: 3.8 g/dL (ref 3.5–5.0)
Alkaline Phosphatase: 60 U/L (ref 38–126)
Anion gap: 14 (ref 5–15)
BUN: 26 mg/dL — ABNORMAL HIGH (ref 6–20)
CO2: 19 mmol/L — ABNORMAL LOW (ref 22–32)
Calcium: 7.9 mg/dL — ABNORMAL LOW (ref 8.9–10.3)
Chloride: 105 mmol/L (ref 98–111)
Creatinine, Ser: 1.07 mg/dL — ABNORMAL HIGH (ref 0.44–1.00)
GFR calc Af Amer: >60 mL/min (ref >60)
GFR calc non Af Amer: 60 mL/min — ABNORMAL LOW (ref >60)
Glucose, Bld: 151 mg/dL — ABNORMAL HIGH (ref 70–99)
Potassium: 3.7 mmol/L (ref 3.5–5.1)
Sodium: 138 mmol/L (ref 135–145)
Total Bilirubin: 1 mg/dL (ref 0.3–1.2)
Total Protein: 6.7 g/dL (ref 6.5–8.1)

## 2019-01-21 LAB — GRAM STAIN

## 2019-01-21 LAB — GLUCOSE, PLEURAL OR PERITONEAL FLUID: Glucose, Fluid: 220 mg/dL

## 2019-01-21 LAB — BODY FLUID CELL COUNT WITH DIFFERENTIAL
Eos, Fluid: 1 %
Lymphs, Fluid: 2 %
Monocyte-Macrophage-Serous Fluid: 6 % — ABNORMAL LOW (ref 50–90)
Neutrophil Count, Fluid: 91 % — ABNORMAL HIGH (ref 0–25)
Total Nucleated Cell Count, Fluid: 2750 cu mm — ABNORMAL HIGH (ref 0–1000)

## 2019-01-21 LAB — POCT I-STAT 7, (LYTES, BLD GAS, ICA,H+H)
Acid-base deficit: 3 mmol/L — ABNORMAL HIGH (ref 0.0–2.0)
Bicarbonate: 21 mmol/L (ref 20.0–28.0)
Calcium, Ion: 1.11 mmol/L — ABNORMAL LOW (ref 1.15–1.40)
HCT: 20 % — ABNORMAL LOW (ref 36.0–46.0)
Hemoglobin: 6.8 g/dL — CL (ref 12.0–15.0)
O2 Saturation: 100 %
Patient temperature: 98.4
Potassium: 3.3 mmol/L — ABNORMAL LOW (ref 3.5–5.1)
Sodium: 137 mmol/L (ref 135–145)
TCO2: 22 mmol/L (ref 22–32)
pCO2 arterial: 31.5 mmHg — ABNORMAL LOW (ref 32.0–48.0)
pH, Arterial: 7.432 (ref 7.350–7.450)
pO2, Arterial: 164 mmHg — ABNORMAL HIGH (ref 83.0–108.0)

## 2019-01-21 LAB — AMYLASE, PLEURAL OR PERITONEAL FLUID
Amylase, Fluid: 1111 U/L
Amylase, Fluid: 7417 U/L
Amylase, Fluid: >10000 U/L

## 2019-01-21 LAB — BPAM RBC
Blood Product Expiration Date: 202101152359
Blood Product Expiration Date: 202101152359
Blood Product Expiration Date: 202101152359
Blood Product Expiration Date: 202101152359
ISSUE DATE / TIME: 202012151045
ISSUE DATE / TIME: 202012171033
Unit Type and Rh: 5100
Unit Type and Rh: 5100
Unit Type and Rh: 5100
Unit Type and Rh: 5100

## 2019-01-21 LAB — PHOSPHORUS: Phosphorus: 2.3 mg/dL — ABNORMAL LOW (ref 2.5–4.6)

## 2019-01-21 LAB — MAGNESIUM: Magnesium: 2.2 mg/dL (ref 1.7–2.4)

## 2019-01-21 LAB — PREPARE RBC (CROSSMATCH)

## 2019-01-21 LAB — PROTEIN, PLEURAL OR PERITONEAL FLUID: Total protein, fluid: 3.6 g/dL

## 2019-01-21 LAB — LACTATE DEHYDROGENASE, PLEURAL OR PERITONEAL FLUID: LD, Fluid: 544 U/L — ABNORMAL HIGH (ref 3–23)

## 2019-01-21 MED ORDER — SODIUM CHLORIDE 0.9 % IV SOLN: INTRAVENOUS | Status: DC | PRN

## 2019-01-21 MED ORDER — LIDOCAINE HCL 1 % IJ SOLN
INTRAMUSCULAR | Status: AC
  Filled 2019-01-21: qty 20

## 2019-01-21 MED ORDER — PROPOFOL 1000 MG/100ML IV EMUL
5.0000 ug/kg/min | INTRAVENOUS | Status: DC
  Administered 2019-01-21: 10 ug/kg/min via INTRAVENOUS
  Administered 2019-01-21: 50 ug/kg/min via INTRAVENOUS
  Administered 2019-01-21: 70 ug/kg/min via INTRAVENOUS
  Administered 2019-01-21: 50 ug/kg/min via INTRAVENOUS
  Filled 2019-01-21 (×4): qty 100

## 2019-01-21 MED ORDER — FENTANYL 2500MCG IN NS 250ML (10MCG/ML) PREMIX INFUSION
0.0000 ug/h | INTRAVENOUS | Status: DC
  Administered 2019-01-21: 300 ug/h via INTRAVENOUS
  Administered 2019-01-21: 50 ug/h via INTRAVENOUS
  Administered 2019-01-22: 100 ug/h via INTRAVENOUS
  Filled 2019-01-21 (×3): qty 250

## 2019-01-21 MED ORDER — LORAZEPAM 2 MG/ML IJ SOLN
1.0000 mg | INTRAMUSCULAR | Status: DC | PRN
  Administered 2019-01-21 – 2019-01-28 (×19): 1 mg via INTRAVENOUS
  Filled 2019-01-21 (×18): qty 1

## 2019-01-21 MED ORDER — SUCCINYLCHOLINE CHLORIDE 20 MG/ML IJ SOLN
INTRAMUSCULAR | Status: DC | PRN
  Administered 2019-01-21: 100 mg via INTRAVENOUS

## 2019-01-21 MED ORDER — FENTANYL CITRATE (PF) 100 MCG/2ML IJ SOLN
25.0000 ug | INTRAMUSCULAR | Status: DC | PRN
  Administered 2019-01-21: 100 ug via INTRAVENOUS

## 2019-01-21 MED ORDER — ORAL CARE MOUTH RINSE
15.0000 mL | OROMUCOSAL | Status: DC
  Administered 2019-01-21 – 2019-01-22 (×15): 15 mL via OROMUCOSAL

## 2019-01-21 MED ORDER — LIDOCAINE HCL (PF) 1 % IJ SOLN
INTRAMUSCULAR | Status: DC | PRN
  Administered 2019-01-21: 5 mL

## 2019-01-21 MED ORDER — SODIUM CHLORIDE 0.9% FLUSH
10.0000 mL | INTRAVENOUS | Status: DC | PRN
  Administered 2019-01-28: 10 mL

## 2019-01-21 MED ORDER — POTASSIUM CHLORIDE 20 MEQ/15ML (10%) PO SOLN
40.0000 meq | Freq: Once | ORAL | Status: AC
  Administered 2019-01-21: 40 meq
  Filled 2019-01-21: qty 30

## 2019-01-21 MED ORDER — SODIUM CHLORIDE 0.9% FLUSH
10.0000 mL | Freq: Two times a day (BID) | INTRAVENOUS | Status: DC
  Administered 2019-01-21 (×2): 10 mL

## 2019-01-21 MED ORDER — POTASSIUM PHOSPHATES 15 MMOLE/5ML IV SOLN
10.0000 mmol | Freq: Once | INTRAVENOUS | Status: AC
  Administered 2019-01-21: 10 mmol via INTRAVENOUS
  Filled 2019-01-21: qty 3.33

## 2019-01-21 MED ORDER — SODIUM CHLORIDE 0.9% IV SOLUTION: Freq: Once | INTRAVENOUS | Status: DC

## 2019-01-21 MED ORDER — FUROSEMIDE 10 MG/ML IJ SOLN
40.0000 mg | Freq: Once | INTRAMUSCULAR | Status: AC
  Administered 2019-01-21: 40 mg via INTRAVENOUS
  Filled 2019-01-21: qty 4

## 2019-01-21 MED ORDER — SODIUM CHLORIDE 0.9% FLUSH
10.0000 mL | Freq: Two times a day (BID) | INTRAVENOUS | Status: DC
  Administered 2019-01-21 – 2019-01-22 (×3): 10 mL

## 2019-01-21 MED ORDER — PROPOFOL 10 MG/ML IV BOLUS
INTRAVENOUS | Status: DC | PRN
  Administered 2019-01-21: 120 mg via INTRAVENOUS

## 2019-01-21 MED ORDER — TRAVASOL 10 % IV SOLN
INTRAVENOUS | Status: AC
  Filled 2019-01-21: qty 556.8

## 2019-01-21 MED ORDER — SODIUM CHLORIDE 0.9% FLUSH: 10.0000 mL | INTRAVENOUS | Status: DC | PRN

## 2019-01-21 NOTE — Anesthesia Procedure Notes (Addendum)
Procedure Name: Intubation Date/Time: 01/21/2019 2:40 AM Performed by: Suzy Bouchard, CRNA Pre-anesthesia Checklist: Patient identified, Emergency Drugs available, Suction available, Patient being monitored and Timeout performed Patient Re-evaluated:Patient Re-evaluated prior to induction Oxygen Delivery Method: Circle system utilized Preoxygenation: Pre-oxygenation with 100% oxygen Induction Type: IV induction, Rapid sequence and Cricoid Pressure applied Laryngoscope Size: Glidescope and 4 Grade View: Grade I Tube type: Oral Tube size: 7.5 mm Number of attempts: 1 Airway Equipment and Method: Stylet and Video-laryngoscopy Placement Confirmation: ETT inserted through vocal cords under direct vision,  breath sounds checked- equal and bilateral and CO2 detector Secured at: 24 cm Tube secured with: Tape Dental Injury: Teeth and Oropharynx as per pre-operative assessment

## 2019-01-21 NOTE — Progress Notes (Signed)
Peripherally Inserted Central Catheter/Midline Placement  The IV Nurse has discussed with the patient and/or persons authorized to consent for the patient, the purpose of this procedure and the potential benefits and risks involved with this procedure.  The benefits include less needle sticks, lab draws from the catheter, and the patient may be discharged home with the catheter. Risks include, but not limited to, infection, bleeding, blood clot (thrombus formation), and puncture of an artery; nerve damage and irregular heartbeat and possibility to perform a PICC exchange if needed/ordered by physician.  Alternatives to this procedure were also discussed.  B[employer] PICC patient education guide, fact sheet on infection prevention and patient information card has been provided to patient /or left at bedside.    PICC/Midline Placement Documentation  PICC Double Lumen 01/21/19 PICC Right Brachial 37 cm 0 cm (Active)  Indication for Insertion or Continuance of Line Administration of hyperosmolar/irritating solutions (i.e. TPN, Vancomycin, etc.);Vasoactive infusions 01/21/19 1109  Exposed Catheter (cm) 0 cm 01/21/19 1109  Site Assessment Clean;Dry;Intact 01/21/19 1109  Lumen #1 Status Flushed;Blood return noted 01/21/19 1109  Lumen #2 Status Flushed;Blood return noted 01/21/19 1109  Dressing Type Transparent 01/21/19 1109  Dressing Status Clean;Dry;Intact;Antimicrobial disc in place;Other (Comment) 01/21/19 1109  Dressing Intervention New dressing 01/21/19 1109  Dressing Change Due 01/28/19 01/21/19 1109    Telephone consent signed by husband   Christella Noa Albarece 01/21/2019, 11:10 AM

## 2019-01-21 NOTE — Progress Notes (Signed)
Critical result came back on ISTAT ABG for the Hemoglobin of 6.8. Result was handed off to [employer].

## 2019-01-21 NOTE — Progress Notes (Signed)
6 Days Post-Op   Subjective/Chief Complaint: Patient intubated overnight for respiratory distress and hypoxia CCM has been consulted to assist with vent support IR thoracentesis pending  Significant issues with IV access - only one lumen available currently.  PICC line pending    Objective: Vital signs in last 24 hours: Temp:  [97.8 F (36.6 C)-99.1 F (37.3 C)] 99.1 F (37.3 C) (12/21 0800) Pulse Rate:  [102-137] 120 (12/21 0900) Resp:  [10-24] 19 (12/21 0900) BP: (108-153)/(67-95) 124/84 (12/21 0800) SpO2:  [91 %-100 %] 100 % (12/21 0900) FiO2 (%):  [40 %-60 %] 40 % (12/21 0832) Last BM Date: 01/14/19  Intake/Output from previous day: 12/20 0701 - 12/21 0700 In: 723.9 [I.V.:147; IV Piggyback:576.9] Out: 1038 [Emesis/NG output:650; Drains:138; Stool:250] Intake/Output this shift: Total I/O In: 33.9 [I.V.:33.9] Out: -   Gen:  Sedated, intubated Resp:  Mild tachypnea; coarse breath sounds CV - tachycardic; reg rhythm GI:  Soft, non-distended.  Serosanguinous drainage in drains Incision c/d/i  Lab Results:  Recent Labs    01/20/19 0623 01/21/19 0250 01/21/19 0430  WBC 39.7* 30.2*  --   HGB 8.1* 7.3* 6.8*  HCT 26.1* 24.0* 20.0*  PLT 261 260  --    BMET Recent Labs    01/20/19 0623 01/21/19 0250 01/21/19 0430  NA 137 138 137  K 4.3 3.7 3.3*  CL 106 105  --   CO2 18* 19*  --   GLUCOSE 309* 151*  --   BUN 26* 26*  --   CREATININE 1.12* 1.07*  --   CALCIUM 7.6* 7.9*  --    PT/INR No results for input(s): LABPROT, INR in the last 72 hours. 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 LEFT pleural effusion on chest radiograph. EXAM: CT CHEST, ABDOMEN, AND PELVIS WITH CONTRAST TECHNIQUE: Multidetector  CT imaging of the chest, abdomen and pelvis was performed following the standard protocol during bolus administration of intravenous contrast. CONTRAST:  161mL OMNIPAQUE IOHEXOL 300 MG/ML  SOLN COMPARISON:  01/09/2019 PET CT and prior studies. 01/19/2019 chest radiograph FINDINGS: CT CHEST FINDINGS Cardiovascular: Heart size is normal. No thoracic aortic abnormality identified. No pericardial effusion. Mediastinum/Nodes: No enlarged mediastinal, hilar, or axillary lymph nodes. Thyroid gland, trachea, and esophagus demonstrate no significant findings. Lungs/Pleura: A moderate LEFT pleural effusion is noted with associated LEFT LOWER lobe atelectasis. Mild-to-moderate subsegmental atelectasis within the RIGHT LOWER lobe noted. Two small areas of ground-glass opacity within the RIGHT lung apex are noted (series for: Images 20 3-32) are nonspecific. There is no evidence of pneumothorax or RIGHT pleural effusion. Musculoskeletal: No acute or suspicious bony abnormalities are identified. CT ABDOMEN PELVIS FINDINGS Hepatobiliary: Probable mild hepatic steatosis identified. A stent/catheter within what appears to be RIGHT hepatic/CBD to the hepaticojejunostomy site noted. No biliary dilatation is identified. Patient is status post cholecystectomy. Pancreas: Pancreaticoduodenectomy changes noted with 2 percutaneous drains in this region. A 3.6 x 7.6 x 4.6 cm collection at the site of the resected pancreatic head is noted, without well-defined capsule. A small amount of adjacent fluid along the operative site is noted. No evidence of gas collection in this area. A stent/catheter in the remaining portion of the pancreas/pancreatic duct is noted. Spleen: No significant abnormality. Adrenals/Urinary Tract: The kidneys, adrenal glands and  bladder are unremarkable. Stomach/Bowel: Mildly distended gas-filled loops of colon and small bowel are noted, likely ileus. No other significant abnormality identified. No definite extraluminal  oral contrast identified. Vascular/Lymphatic: Aortic atherosclerosis. No enlarged abdominal or pelvic lymph nodes. Reproductive: Uterus and bilateral adnexa are unremarkable. Other: A small amount of ascites within the pelvis is noted. Small foci of pneumoperitoneum are not unexpected postoperatively. Moderate subcutaneous edema along the LATERAL aspects of the abdomen pelvis, LEFT-greater-than-RIGHT noted. Musculoskeletal: No acute or suspicious bony abnormalities. Degenerative changes in the lumbar spine and RIGHT hip are again identified. IMPRESSION: 1. Pancreaticoduodenectomy and hepaticojejunostomy changes as described. 3.6 x 7.6 x 4.6 cm postoperative collection at the site of the resected pancreatic head without well-defined capsule or gas. Surgical drain/catheters as described. 2. Moderate LEFT pleural effusion with associated LEFT LOWER lobe atelectasis and mild to moderate RIGHT basilar atelectasis. 3. Mildly distended gas-filled loops of colon and small bowel-likely ileus. 4. Small amount of pelvic free fluid. Subcutaneous edema along the LATERAL abdomen and pelvis. 5. Two small ground-glass opacities within the RIGHT lung apex, nonspecific. 6. Probable mild hepatic steatosis Electronically Signed   By: Margarette Canada M.D.   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 LEFT pleural effusion on chest radiograph. EXAM: CT CHEST, ABDOMEN, AND PELVIS WITH CONTRAST TECHNIQUE: Multidetector CT imaging of the chest, abdomen and pelvis was performed following the standard protocol during bolus administration of intravenous contrast. CONTRAST:  149mL OMNIPAQUE IOHEXOL 300 MG/ML  SOLN COMPARISON:  01/09/2019 PET CT and prior studies. 01/19/2019 chest radiograph FINDINGS: CT CHEST FINDINGS Cardiovascular: Heart  size is normal. No thoracic aortic abnormality identified. No pericardial effusion. Mediastinum/Nodes: No enlarged mediastinal, hilar, or axillary lymph nodes. Thyroid gland, trachea, and esophagus demonstrate no significant findings. Lungs/Pleura: A moderate LEFT pleural effusion is noted with associated LEFT LOWER lobe atelectasis. Mild-to-moderate subsegmental atelectasis within the RIGHT LOWER lobe noted. Two small areas of ground-glass opacity within the RIGHT lung apex are noted (series for: Images 20 3-32) are nonspecific. There is no evidence of pneumothorax or RIGHT pleural effusion. Musculoskeletal: No acute or suspicious bony abnormalities are identified. CT ABDOMEN PELVIS FINDINGS Hepatobiliary: Probable mild hepatic steatosis identified. A stent/catheter within what appears to be RIGHT hepatic/CBD to the hepaticojejunostomy site noted. No biliary dilatation is identified. Patient is status post cholecystectomy. Pancreas: Pancreaticoduodenectomy changes noted with 2 percutaneous drains in this region. A 3.6 x 7.6 x 4.6 cm collection at the site of the resected pancreatic head is noted, without well-defined capsule. A small amount of adjacent fluid along the operative site is noted. No evidence of gas collection in this area. A stent/catheter in the remaining portion of the pancreas/pancreatic duct is noted. Spleen: No significant abnormality. Adrenals/Urinary Tract: The kidneys, adrenal glands and bladder are unremarkable. Stomach/Bowel: Mildly distended gas-filled loops of colon and small bowel are noted, likely ileus. No other significant abnormality identified. No definite extraluminal oral contrast identified. Vascular/Lymphatic: Aortic atherosclerosis. No enlarged abdominal or pelvic lymph nodes. Reproductive: Uterus and bilateral adnexa are unremarkable. Other: A small amount of ascites within the pelvis is noted. Small foci of pneumoperitoneum are not unexpected postoperatively. Moderate  subcutaneous edema along the LATERAL aspects of the abdomen pelvis, LEFT-greater-than-RIGHT noted. Musculoskeletal: No acute or suspicious bony abnormalities. Degenerative changes in the lumbar spine and RIGHT hip are again identified. IMPRESSION: 1.  Pancreaticoduodenectomy and hepaticojejunostomy changes as described. 3.6 x 7.6 x 4.6 cm postoperative collection at the site of the resected pancreatic head without well-defined capsule or gas. Surgical drain/catheters as described. 2. Moderate LEFT pleural effusion with associated LEFT LOWER lobe atelectasis and mild to moderate RIGHT basilar atelectasis. 3. Mildly distended gas-filled loops of colon and small bowel-likely ileus. 4. Small amount of pelvic free fluid. Subcutaneous edema along the LATERAL abdomen and pelvis. 5. Two small ground-glass opacities within the RIGHT lung apex, nonspecific. 6. Probable mild hepatic steatosis Electronically Signed   By: Margarette Canada M.D.   On: 01/20/2019 10:42   DG CHEST PORT 1 VIEW  Result Date: 01/21/2019 CLINICAL DATA:  Intubation and orogastric tube placement. EXAM: PORTABLE CHEST 1 VIEW COMPARISON:  Radiograph earlier this day. Chest CT yesterday. FINDINGS: Endotracheal tube tip 2.3 cm from the carina. Tip and side port of the enteric tube below the diaphragm in the stomach. Hazy left lung base opacity consistent with pleural effusion and atelectasis as seen on CT yesterday. Streaky right lung base atelectasis. No new airspace disease. No pneumothorax. Surgical drains in the upper abdomen. IMPRESSION: 1. Endotracheal tube tip 2.3 cm from the carina. Enteric tube tip and side-port in the stomach. 2. Hazy left lung base opacity consistent with pleural effusion and atelectasis as seen on CT. Electronically Signed   By: Keith Rake M.D.   On: 01/21/2019 03:24   DG CHEST PORT 1 VIEW  Result Date: 01/21/2019 CLINICAL DATA:  Increased shortness of breath tonight. EXAM: PORTABLE CHEST 1 VIEW COMPARISON:  Chest CT  yesterday. Radiograph 01/19/2019 FINDINGS: Left lung base opacity corresponding to pleural effusion and atelectasis on CT. This is not significantly changed since prior exams. No visualized pneumothorax. Right lung is hypo aerated with minor atelectasis in the lung base. Previous right central line has been removed. Unchanged heart size and mediastinal contours. No acute osseous abnormalities are seen. Surgical drain in the left upper quadrant. IMPRESSION: Left lung base opacity corresponding to pleural fluid and atelectasis on CT. Overall findings are not significantly changed. Minimal streaky right lower lobe atelectasis. Electronically Signed   By: Keith Rake M.D.   On: 01/21/2019 01:57   DG CHEST PORT 1 VIEW  Result Date: 01/19/2019 CLINICAL DATA:  Leukocytosis EXAM: PORTABLE CHEST 1 VIEW COMPARISON:  01/15/2019 FINDINGS: Interval removal of enteric tube. Right IJ central venous catheter remains in place with distal tip terminating at the level of the SVC. Stable heart size. Interval development of left basilar airspace opacity. Probable small left pleural effusion. Right lung is clear. No pneumothorax. Surgical drains noted in the left upper abdominal quadrant. IMPRESSION: 1. Interval development of left basilar airspace opacity, which may represent atelectasis versus pneumonia. 2. Probable small left pleural effusion. Electronically Signed   By: Davina Poke M.D.   On: 01/19/2019 12:23   [place] EKG SITE RITE  Result Date: 01/21/2019 If Site Rite image not attached, placement could not be confirmed due to current cardiac rhythm.   Anti-infectives: Anti-infectives (From admission, onward)   Start     Dose/Rate Route Frequency Ordered Stop   01/19/19 1430  vancomycin (VANCOREADY) IVPB 1500 mg/300 mL     1,500 mg 150 mL/hr over 120 Minutes Intravenous Every 24 hours 01/19/19 1351     01/19/19 1400  ceFEPIme (MAXIPIME) 2 g in sodium chloride 0.9 % 100 mL IVPB     2 g 200 mL/hr over 30  Minutes Intravenous Every 8 hours 01/19/19 1349  01/15/19 2000  ceFAZolin (ANCEF) IVPB 2g/100 mL premix     2 g 200 mL/hr over 30 Minutes Intravenous Every 8 hours 01/15/19 1712 01/15/19 2052   01/15/19 0600  ceFAZolin (ANCEF) IVPB 2g/100 mL premix     2 g 200 mL/hr over 30 Minutes Intravenous On call to O.R. 01/15/19 0549 01/15/19 1145      Assessment/Plan: s/p Procedure(s): WHIPPLE PROCEDURE (N/A) LAPAROSCOPY DIAGNOSTIC (N/A) DISTAL PANCREATECTOMY (N/A) Neuroendocrine cancer of the pancreas.  Await pathology.  Multifocal.    Surgery 12/15 Dr. Barry Dienes ABL anemia - transfuse today DM - increase lantus.  TRH to follow Leukocytosis - CXR suspicious for pneumonia.  Vanc/cefepime started yesterday. Reintubated.  IR thoracentesis today.  Blood cx pending.  Given continued leukocytosis will get CT C/A/P.  Check drains for amylase.   Fluid collection at site of resected pancreatic head does not appear to be formed or contain gas F/E/N - ileus; start TPN once PICC line placed Acidosis - was likely related to blood loss/underresuscitation.  CO2 still coming back to normal.   AKI - resolved, but cr back up just a bit.   Hypomagnesemia and hypophosphatemia - repleted, needs more phos again today. Elevated LFTs - resolved     Dispo: ICU; CCM assist with vent management PICC line  IR thoracentesis  LOS: 6 days    Maia Petties 01/21/2019

## 2019-01-21 NOTE — Procedures (Signed)
PROCEDURE SUMMARY:  Successful US guided left thoracentesis. Yielded 300 mL of clear yellow fluid. Pt tolerated procedure well. No immediate complications.  Specimen was sent for labs. CXR ordered.  EBL < 5 mL  Ascencion Dike PA-C 01/21/2019 10:24 AM

## 2019-01-21 NOTE — Consult Note (Signed)
NAME:  Leah Farmer, MRN:  [id number], DOB:  [birth year], LOS: 6 ADMISSION DATE:  01/15/2019, CONSULTATION DATE:  01/21/19 REFERRING MD:  Barry Dienes  CHIEF COMPLAINT:  Vent Management   Brief History   Leah Farmer is a 53 y.o. female who underwent [facility] 12/15 for neuroendocrine tumor of pancreas.  Post op course c/b resp failure overnight 12/20 requiring re-intubation. PCCM asked to assist with vent management.  History of present illness   Pt is encephelopathic; therefore, this HPI is obtained from chart review. Leah Farmer is a 53 y.o. female who has a PMH including but not limited to neuroendocrine tumor of the pancreas s/p diagnostic laparoscopy, classic pancreaticoduodenectomy, distal pancreatectomy, placement of biliary and pancreatic duct stents 01/15/19 (Dr. Barry Dienes).  Post op course was c/b postop ileus, sinus tach, ABLA. Evening of 12/20, she developed respiratory distress and was intubated by anesthesia.  PCCM asked to see in consultation 12/21 for vent management.  Past Medical History  has Nausea & vomiting; Acute flank pain; Flank pain; COVID-19 virus infection; Nephrolithiasis; Hydronephrosis, left; Acute lower UTI; Tachycardia; CAP (community acquired pneumonia); Hypokalemia; Abnormal liver function; Encounter for screening colonoscopy; Pancreatic lesion; Pancreatic cancer ([place]); Primary malignant neuroendocrine neoplasm of pancreas ([place]); Diabetes mellitus type 2 in obese ([place]); HCAP (healthcare-associated pneumonia); Essential hypertension; and Depression on their problem list.  Significant Hospital Events   12/15 > diagnostic laparoscopy, classic pancreaticoduodenectomy, distal pancreatectomy, placement of biliary and pancreatic duct stents 01/15/19 (Dr. Barry Dienes) 12/20 > respiratory distress requiring re-intubation.  Consults:  PCCM.  Procedures:  ETT 12/20 >   Significant Diagnostic Tests:  CT chest / abd / pelv 12/20 > post op changes as expected.  Mod  left pleural effusion with atx.  Distended gas filled loops of colon and SB likely due to ileus, small amount pelvic free fluid, probabel mild hepatic steatosis.  Micro Data:  Blood 12/19 >   Antimicrobials:  Cefepime 12/19 >  Vanc 12/19 >    Interim history/subjective:  Sedated on vent, comfortable.  Objective:  Blood pressure 124/84, pulse (!) 119, temperature 98.7 F (37.1 C), temperature source Axillary, resp. rate 19, height 5\' 5"  (1.651 m), weight 114.3 kg, last menstrual period 01/12/2019, SpO2 100 %.    Vent Mode: PRVC FiO2 (%):  [40 %-60 %] 40 % Set Rate:  [16 bmp-18 bmp] 16 bmp Vt Set:  [450 mL] 450 mL PEEP:  [5 cmH20-8 cmH20] 5 cmH20 Plateau Pressure:  [21 cmH20] 21 cmH20   Intake/Output Summary (Last 24 hours) at 01/21/2019 0846 Last data filed at 01/21/2019 0800 Gross per 24 hour  Intake 757.75 ml  Output 1038 ml  Net -280.25 ml   Filed Weights   01/15/19 0612  Weight: 114.3 kg    Examination: General: Adult female, in NAD. Neuro: Sedated, does not follow commands. HEENT: [place]/AT. Sclerae anicteric.  ETT in place. Cardiovascular: RRR, no M/R/G.  Lungs: Respirations even and unlabored.  CTA bilaterally, No W/R/R.  Abdomen: Obese.  Abdominal incisions C/D/I.  LLQ drains noted.  BS hypoactive, soft, NT/ND.  Musculoskeletal: No gross deformities, no edema.  Skin: Intact, warm, no rashes.  Assessment & Plan:   Neuroendocrine tumor of the pancreas - s/p diagnostic laparoscopy, classic pancreaticoduodenectomy, distal pancreatectomy, placement of biliary and pancreatic duct stents 01/15/19 (Dr. Barry Dienes). - Post op care per surgery.  Acute hypoxic respiratory failure. - Full vent support. - Wean as able. - Bronchial hygiene. - Follow CXR.  Left pleural effusion with basilar atx. - Primary team has already discussed  thora with IR.  Possible PNA. - Continue cefepime. - Assess sputum culture.  DM2. - SSI. - DM coordinator for counseling / education once  ready for discharge.  Hx HTN. - Continue metpprolol. - Continue to hold lisinopril / HCTZ.  Post-op ileus. - Per primary.  ABLA - pending 2u PRBC 12/21. - Transfuse for Hgb < 7.  Prolonged QTc (548, ? Accuracy due to incomplete BBB). - Maintain K > 4, Mg > 2.   Best Practice:  Diet: NPO. Pain/Anxiety/Delirium protocol (if indicated): Propofol gtt / fentanyl PRN.  RASS goal 0 to -1. VAP protocol (if indicated): In place. DVT prophylaxis: SCD's. GI prophylaxis: PPI. Glucose control: SSI, lantus. Mobility: Bedrest. Code Status: Full. Family Communication: None available. Disposition: ICU.  Labs   CBC: Recent Labs  Lab 01/17/19 0440 01/18/19 0530 01/19/19 0615 01/20/19 0623 01/21/19 0250 01/21/19 0430  WBC 22.5* 21.1* 30.5* 39.7* 30.2*  --   HGB 6.0* 6.9* 7.4* 8.1* 7.3* 6.8*  HCT 18.5* 21.6* 23.9* 26.1* 24.0* 20.0*  MCV 92.0 90.4 94.5 92.6 94.5  --   PLT 170 165 221 261 260  --    Basic Metabolic Panel: Recent Labs  Lab 01/16/19 0500 01/17/19 0440 01/18/19 0530 01/19/19 0615 01/20/19 0623 01/21/19 0250 01/21/19 0430  NA 139 137 143 139 137 138 137  K 3.9 3.8 4.0 4.0 4.3 3.7 3.3*  CL 110 109 112* 108 106 105  --   CO2 12* 17* 19* 20* 18* 19*  --   GLUCOSE 226* 237* 214* 253* 309* 151*  --   BUN 26* 30* 26* 24* 26* 26*  --   CREATININE 1.55* 1.14* 1.03* 0.99 1.12* 1.07*  --   CALCIUM 7.4* 7.0* 7.3* 7.5* 7.6* 7.9*  --   MG 1.3* 2.5* 2.8* 2.4 2.3  --   --   PHOS 1.6* 3.0 1.5* 1.2* 1.9*  --   --    GFR: Estimated Creatinine Clearance: 77.6 mL/min (A) (by C-G formula based on SCr of 1.07 mg/dL (H)). Recent Labs  Lab 01/18/19 0530 01/19/19 0615 01/20/19 0623 01/21/19 0250  WBC 21.1* 30.5* 39.7* 30.2*   Liver Function Tests: Recent Labs  Lab 01/17/19 0440 01/18/19 0530 01/19/19 0615 01/20/19 0623 01/21/19 0250  AST 117* 43* 25 27 26   ALT 82* 58* 42 32 24  ALKPHOS 34* 44 54 82 60  BILITOT 0.6 0.7 0.5 0.5 1.0  PROT 5.7* 5.9* 5.9* 6.1* 6.7   ALBUMIN 3.6 3.2* 2.8* 2.6* 3.8   No results for input(s): LIPASE, AMYLASE in the last 168 hours. No results for input(s): AMMONIA in the last 168 hours. ABG    Component Value Date/Time   PHART 7.432 01/21/2019 0430   PCO2ART 31.5 (L) 01/21/2019 0430   PO2ART 164.0 (H) 01/21/2019 0430   HCO3 21.0 01/21/2019 0430   TCO2 22 01/21/2019 0430   ACIDBASEDEF 3.0 (H) 01/21/2019 0430   O2SAT 100.0 01/21/2019 0430    Coagulation Profile: Recent Labs  Lab 01/16/19 0610  INR 1.3*   Cardiac Enzymes: No results for input(s): CKTOTAL, CKMB, CKMBINDEX, TROPONINI in the last 168 hours. HbA1C: Hgb A1c MFr Bld  Date/Time Value Ref Range Status  01/11/2019 11:10 AM 7.5 (H) 4.8 - 5.6 % Final    Comment:    (NOTE) Pre diabetes:          5.7%-6.4% Diabetes:              >6.4% Glycemic control for   <7.0% adults with diabetes  CBG: Recent Labs  Lab 01/20/19 1622 01/20/19 1941 01/20/19 2326 01/21/19 0328 01/21/19 0809  GLUCAP 296* 283* 223* 134* 231*    Review of Systems:   Unable to obtain as pt is encephalopathic.  Past medical history  She,  has a past medical history of Anxiety, Arthritis (2020), Cancer ([place]) (2020), Depression, Diabetes mellitus without complication ([place]), GERD (gastroesophageal reflux disease), History of kidney stones (07/2018), Hypertension, NAFL (nonalcoholic fatty liver) (0000000), Nephrolithiasis, PONV (postoperative nausea and vomiting), and Tachycardia.   Surgical History    Past Surgical History:  Procedure Laterality Date  . APPENDECTOMY  in 20s  . CHOLECYSTECTOMY  in 20s  . ESOPHAGOGASTRODUODENOSCOPY (EGD) WITH PROPOFOL N/A 11/01/2018   Procedure: ESOPHAGOGASTRODUODENOSCOPY (EGD) WITH PROPOFOL;  Surgeon: Milus Banister, MD;  Location: [facility];  Service: Endoscopy;  Laterality: N/A;  . EUS N/A 11/01/2018   Procedure: UPPER ENDOSCOPIC ULTRASOUND (EUS) LINEAR;  Surgeon: Milus Banister, MD;  Location: [facility];  Service: Endoscopy;   Laterality: N/A;  . FINE NEEDLE ASPIRATION N/A 11/01/2018   Procedure: FINE NEEDLE ASPIRATION (FNA) LINEAR;  Surgeon: Milus Banister, MD;  Location: [facility];  Service: Endoscopy;  Laterality: N/A;  . LAPAROSCOPY N/A 01/15/2019   Procedure: LAPAROSCOPY DIAGNOSTIC;  Surgeon: Stark Klein, MD;  Location: [facility];  Service: General;  Laterality: N/A;  . WHIPPLE PROCEDURE N/A 01/15/2019   Procedure: WHIPPLE PROCEDURE;  Surgeon: Stark Klein, MD;  Location: [facility];  Service: General;  Laterality: N/A;     Social History   reports that she has never smoked. She has never used smokeless tobacco. She reports that she does not drink alcohol or use drugs.   Family history   Her family history includes Diabetes in her father and mother; Esophageal cancer in her maternal uncle; Hypertension in her father and mother; Pancreatic cancer in her mother. There is no history of Colon cancer or Colon polyps.   Allergies Allergies  Allergen Reactions  . Ciprofloxacin Hives     Home meds  Prior to Admission medications   Medication Sig Start Date End Date Taking? Authorizing Provider  clonazePAM (KLONOPIN) 0.5 MG tablet Take 0.25-0.5 mg by mouth daily as needed for anxiety.   Yes [provider]  ibuprofen (ADVIL) 200 MG tablet Take 800 mg by mouth daily.   Yes [provider]  lisinopril-hydrochlorothiazide (ZESTORETIC) 20-12.5 MG tablet Take 1 tablet by mouth daily.   Yes [provider]  Menthol, Topical Analgesic, (BIOFREEZE ROLL-ON EX) Apply 1 application topically 3 (three) times daily as needed (back pain.).   Yes [provider]  metoprolol succinate (TOPROL-XL) 50 MG 24 hr tablet Take 50 mg by mouth at bedtime. Take with or immediately following a meal.    Yes [provider]  norgestrel-ethinyl estradiol (CRYSELLE-28) 0.3-30 MG-MCG tablet Take 1 tablet by mouth at bedtime.    Yes [provider]  omeprazole (PRILOSEC) 20 MG capsule Take 20  mg by mouth daily.   Yes [provider]  ondansetron (ZOFRAN) 4 MG tablet Take 4 mg by mouth every 8 (eight) hours as needed for nausea or vomiting.   Yes [provider]  OVER THE COUNTER MEDICATION Take 650 mg by mouth every other day. Hydrangea Root (Hydrangea arborescens)   Yes [provider]  OVER THE COUNTER MEDICATION Take 15 mLs by mouth daily. Apple cider vinegar with mother   Yes [provider]  rosuvastatin (CRESTOR) 10 MG tablet Take 10 mg by mouth at bedtime.  Yes [provider]  SitaGLIPtin-MetFORMIN HCl (JANUMET XR) [phone] MG TB24 Take 1 tablet by mouth every evening.   Yes [provider]  traMADol (ULTRAM) 50 MG tablet Take 50 mg by mouth 2 (two) times daily as needed (kidney stone pain).  07/21/18  Yes [provider]  Vitamin D, Ergocalciferol, (DRISDOL) 1.25 MG (50000 UT) CAPS capsule Take 50,000 Units by mouth every Saturday.   Yes [provider]  meclizine (ANTIVERT) 12.5 MG tablet Take 12.5 mg by mouth 3 (three) times daily as needed for dizziness.    [provider]  oxyCODONE (OXY IR/ROXICODONE) 5 MG immediate release tablet Take 1-2 tablets (5-10 mg total) by mouth every 6 (six) hours as needed for moderate pain, severe pain or breakthrough pain. 01/19/19   Stark Klein, MD  prochlorperazine (COMPAZINE) 10 MG tablet Take 1 tablet (10 mg total) by mouth every 6 (six) hours as needed for nausea or vomiting (Use for nausea and / or vomiting unresolved with ondansetron (Zofran).). 01/19/19   Stark Klein, MD    Critical care time: 60 min.    Montey Hora, [facility] 01/21/2019, 8:46 AM

## 2019-01-21 NOTE — Progress Notes (Signed)
PT Cancellation Note  Patient Details Name: Leah Farmer MRN: [id number] DOB: 1965/03/21   Cancelled Treatment:    Reason Eval/Treat Not Completed: Medical issues which prohibited therapy;Patient not medically ready.  Newly tubed, getting a [facility].  RN asked to hold today. 01/21/2019  Ginger Carne., PT Acute Rehabilitation Services [phone]  (pager) [phone]  (office)   [name] 01/21/2019, 4:47 PM

## 2019-01-21 NOTE — Progress Notes (Signed)
PROGRESS NOTE   Leah Farmer  [id number]    DOB: [birth year]    DOA: 01/15/2019  PCP: Margy Clarks, NP   I have briefly reviewed patients previous medical records in [facility].  Chief Complaint:  Postop abdominal distention & pain, cough and dyspnea.  Brief Narrative:  53 year old married female, PMH of type II DM, HTN, GERD, anxiety and depression, NAFL, primary malignant neuroendocrine tumor of the pancreas x2, s/p diagnostic laparoscopy, classic pancreatico duodenectomy, distal pancreatectomy, placement of biliary and pancreatic duct stents by CCS on 12/15, TRH was consulted on 12/16 to assist with management of uncontrolled DM and pneumonia.  Postop course complicated by postop ileus, sinus tachycardia, expected acute blood loss anemia and electrolyte abnormalities.  Overnight 12/20 patient developed acute hypoxic respiratory failure requiring intubation and ventilatory support.  CCM consulted 12/21 and took over all medical management.  TRH signed off 12/21.  Please reconsult TRH for any further assistance.   Assessment & Plan:  Principal Problem:   Primary malignant neuroendocrine neoplasm of pancreas [facility]) Active Problems:   Pancreatic tumor   Diabetes mellitus type 2 in obese (HCC)   HCAP (healthcare-associated pneumonia)   Essential hypertension   Depression   Pleural effusion, left   Acute respiratory failure with hypoxia ([place])   Uncontrolled type II DM with hyperglycemia  A1c 7.5 on 12/11 suggesting poor outpatient control.  Was on Janumet XR (sitagliptin-Metformin) PTA, currently on hold.  Agree with increasing Lantus to 16 units twice daily, continue NovoLog SSI/resistant scale every 4 hourly.  Monitor closely and adjust insulins as needed.  Will likely need insulins at discharge.  Consulted diabetes coordinator for counseling and assistance in management.  Patient now NPO due to intubation and ventilatory support.  Being started on TPN.  CBGs  uncontrolled and fluctuating mostly in the low 200 mg per DL range.  Discussed with CCM, continue current regimen, monitor closely and adjust insulins as needed.  Essential hypertension  Mildly uncontrolled at times.  Patient was on Toprol-XL 50 mg nightly PTA.  Current sinus tachycardia may be a combination of acute illness, pain, infection and some beta-blocker withdrawal.  Resumed metoprolol 25 mg twice daily.  Discussed with Dr. Barry Dienes on 12/20, agrees.  Continue to hold prior home dose of lisinopril/HCTZ.  Since NPO, if NG tube being placed then metoprolol can be given via NG tube but if not then recommend changing to IV metoprolol scheduled.  Sinus tachycardia  Heart rates in the 130s this morning.  Multifactorial as noted above.  Treat underlying conditions as noted and resume beta-blockers.  Continue to monitor on telemetry.  EKG 12/20: Sinus tachycardia at 138 bpm,?  Incomplete LBBB, possible LVH with repolarization abnormality.  QTc 548 ms.  Repeat EKG 12/21: QTC 494 ms.    Suspected pneumonia with left pleural effusion, s/p thoracentesis 12/21  Chest x-ray 12/19 showed developing left basilar airspace opacity, atelectasis versus pneumonia.  CT chest with contrast 12/20: Moderate left pleural effusion with associated left lower lobe atelectasis and mild to moderate right basilar atelectasis.  Although chest x-ray yesterday suggested pneumonia, none on CT today.  Has marked leukocytosis/39.7K.  Reasonable to continue IV cefepime and vancomycin for dual indication including suspected pneumonia and postop abdominal coverage.  Patient developed progressive dyspnea and hypoxia overnight and had to be intubated for mechanical ventilation.  IR consulted and s/p 300 mL left thoracentesis on 12/21.  PCCM consulted and took over all medical management.  Acute respiratory failure with hypoxia/s/p intubation 12/21.  Secondary to pneumonia, atelectasis and pleural effusions.   Aggressive pulmonary toilet and oxygen supplementation as needed to maintain saturations >92%.  Patient may have underlying OSA/OHS which can be evaluated as outpatient.  Postop ileus, s/p pancreaticoduodenectomy 12/15 for primary malignant neuroendocrine pancreatic tumor  Mobilize, minimize opioids as able, advance diet as tolerated.  CT abdomen and pelvis with contrast 12/20: Pancreaticoduodenectomy and hepaticojejunostomy.  3.6 x 7.6 x 4.6 postop collection at site of resected pancreatic head without well-defined capsular gas.  Likely small bowel ileus.  Management per primary/CCS  Postop acute blood loss anemia  Hemoglobin as low as 6.9.  Suspect that she was transfused.  Hemoglobin up to 8.1.  Hemoglobin down to 7.3.  CCS has ordered 2 units PRBC transfusion.  Leukocytosis  Suspect multifactorial.  Stress response, pneumonia, remains on IV antibiotics as above.  CT abdomen and pelvis results as noted above.  Does have a small operative site collection-deferred to CCS regarding further intervention.  Covid 19 testing negative on 12/11.  MRSA PCR negative.  Urine microscopy 12/19 not indicative of UTI.  Blood cultures x2: Pending.  WBC count slightly down to 30 K.  Acute kidney injury  Postop day 1, creatinine peaked at 1.55.  Resolved.  Continue IV fluid hydration follow BMP daily.  Hypomagnesemia  Replaced  Hypophosphatemia  Replace and follow as needed.  Metabolic acidosis  Initially had anion gap metabolic acidosis with bicarb as low as 11 and anion gap of 21.  Now mild non-anion gap metabolic acidosis with bicarbonate of 18.  Follow daily BMP.  Prolonged QTC  EKG 12/20: QTC 548 ms.?  Incomplete BBB may cause inaccurate reading.  Potassium >4, magnesium >2.  Minimize medications that may prolong QTC.   Repeat EKG 12/21: QTC 494 ms.    Body mass index is 43.03 kg/m./Morbid obesity  Hyperlipidemia  Resume Crestor when able to consistently tolerate  diet well.  GERD  Resume PPI.  Anxiety  Resume home dose of as needed clonazepam.  2 small groundglass opacities right lung apex  Noted on CT chest 12/20.  Will need follow-up.  Abnormal LFTs  Improving.  Mild fatty liver on CT abdomen.  Right foot drop  ?  Related to epidural, improving.   DVT prophylaxis: SCDs Code Status: Full Family Communication: None at bedside. Disposition: Patient remains critically ill and in the ICU.   Consultants:   TRH are consultants.  Procedures:     s/p diagnostic laparoscopy, classic pancreatico duodenectomy, distal pancreatectomy, placement of biliary and pancreatic duct stents by CCS on 12/15  Has 2 abdominal drains on the right and one on the left.  Right IJ central line and Foley catheter have been discontinued.  Intubation for mechanical ventilation 12/21.  Antimicrobials:   IV cefepime and vancomycin 12/19 >   Subjective:  Overnight events noted, developed progressive dyspnea and hypoxia and had to be intubated.  Currently sedated on propofol drip, follows some instructions, squeezes hands.  OG tube drained 600 cc up to this morning.  No BM or flatus as per RN report.  Objective:   Vitals:   01/21/19 0900 01/21/19 1000 01/21/19 1004 01/21/19 1100  BP:  131/88    Pulse: (!) 120 (!) 116  (!) 117  Resp: 19 19  20   Temp:      TempSrc:      SpO2: 100% 100%  100%  Weight:   117.3 kg   Height:        General exam: Young female, moderately  built and morbidly obese, propped up in bed with ventilatory support, not in distress. Respiratory system: Reduced breath sounds in the bases with few basal crackles.  Otherwise harsh breath sounds.  No wheezing or rhonchi. Cardiovascular system: S1 & S2 heard, regular tachycardia. No JVD, murmurs, rubs, gallops or clicks.  Trace bilateral ankle edema.  Telemetry personally reviewed: Sinus tachycardia in the 100s/110s. Gastrointestinal system: Abdomen is distended, large Y-shaped  upper abdominal surgical incision site with staples intact and no acute findings.  2 right-sided and 1 left-sided abdominal drains.  Appropriate mild diffuse tenderness without rigidity, guarding or rebound.  Still with diminished bowel sounds throughout. Central nervous system: Sedated. No focal neurological deficits. Extremities: As on 12/20 symmetric 5 x 5 power.  Mild right foot drop but reportedly improving. Skin: No rashes, lesions or ulcers Psychiatry: Judgement and insight cannot be assessed. Mood & affect cannot be assessed.     Data Reviewed:   I have personally reviewed following labs and imaging studies   CBC: Recent Labs  Lab 01/19/19 0615 01/20/19 0623 01/21/19 0250 01/21/19 0430  WBC 30.5* 39.7* 30.2*  --   HGB 7.4* 8.1* 7.3* 6.8*  HCT 23.9* 26.1* 24.0* 20.0*  MCV 94.5 92.6 94.5  --   PLT 221 261 260  --     Basic Metabolic Panel: Recent Labs  Lab 01/18/19 0530 01/19/19 0615 01/20/19 0623 01/21/19 0250 01/21/19 0430  NA 143 139 137 138 137  K 4.0 4.0 4.3 3.7 3.3*  CL 112* 108 106 105  --   CO2 19* 20* 18* 19*  --   GLUCOSE 214* 253* 309* 151*  --   BUN 26* 24* 26* 26*  --   CREATININE 1.03* 0.99 1.12* 1.07*  --   CALCIUM 7.3* 7.5* 7.6* 7.9*  --   MG 2.8* 2.4 2.3  --   --   PHOS 1.5* 1.2* 1.9*  --   --     Liver Function Tests: Recent Labs  Lab 01/19/19 0615 01/20/19 0623 01/21/19 0250  AST 25 27 26   ALT 42 32 24  ALKPHOS 54 82 60  BILITOT 0.5 0.5 1.0  PROT 5.9* 6.1* 6.7  ALBUMIN 2.8* 2.6* 3.8    CBG: Recent Labs  Lab 01/20/19 2326 01/21/19 0328 01/21/19 0809  GLUCAP 223* 134* 231*    Microbiology Studies:   Recent Results (from the past 240 hour(s))  Novel Coronavirus, NAA (Hosp order, Send-out to Ref Lab; TAT 18-24 hrs     Status: None   Collection Time: 01/11/19 12:42 PM   Specimen: Nasopharyngeal Swab; Respiratory  Result Value Ref Range Status   SARS-CoV-2, NAA NOT DETECTED NOT DETECTED Final    Comment: (NOTE) This  nucleic acid amplification test was developed and its performance characteristics determined by [employer]. Nucleic acid amplification tests include PCR and TMA. This test has not been FDA cleared or approved. This test has been authorized by FDA under an Emergency Use Authorization (EUA). This test is only authorized for the duration of time the declaration that circumstances exist justifying the authorization of the emergency use of in vitro diagnostic tests for detection of SARS-CoV-2 virus and/or diagnosis of COVID-19 infection under section 564(b)(1) of the Act, 21 U.S.C. [id number]) (1), unless the authorization is terminated or revoked sooner. When diagnostic testing is negative, the possibility of a false negative result should be considered in the context of a patient's recent exposures and the presence of clinical signs and symptoms consistent with COVID-19. An individual  without symptoms of COVID- 19 and who is not shedding SARS-CoV-2 vi rus would expect to have a negative (not detected) result in this assay. Performed At: [facility] [address] [place], [place] [id number] Rush Farmer MD [id number]    [place]  Final    Comment: Performed at [facility], [place] [address] [place], [place] [address]  MRSA PCR Screening     Status: None   Collection Time: 01/15/19  5:33 PM   Specimen: Nasal Mucosa; Nasopharyngeal  Result Value Ref Range Status   MRSA by PCR NEGATIVE NEGATIVE Final    Comment:        The GeneXpert MRSA Assay (FDA approved for NASAL specimens only), is one component of a comprehensive MRSA colonization surveillance program. It is not intended to diagnose MRSA infection nor to guide or monitor treatment for MRSA infections. Performed at [facility], [place] [address], [place], [place] [address]   Culture, blood (routine x 2)     Status: None (Preliminary result)   Collection Time: 01/19/19  10:42 AM   Specimen: BLOOD RIGHT HAND  Result Value Ref Range Status   Specimen Description BLOOD RIGHT HAND  Final   Special Requests   Final    BOTTLES DRAWN AEROBIC AND ANAEROBIC Blood Culture adequate volume   Culture   Final    NO GROWTH 2 DAYS Performed at [facility], [place] [address] [place], [place] [address]    Report Status PENDING  Incomplete  Culture, blood (routine x 2)     Status: None (Preliminary result)   Collection Time: 01/19/19 10:50 AM   Specimen: BLOOD  Result Value Ref Range Status   Specimen Description BLOOD BLOOD RIGHT WRIST  Final   Special Requests   Final    BOTTLES DRAWN AEROBIC AND ANAEROBIC Blood Culture adequate volume   Culture   Final    NO GROWTH 2 DAYS Performed at [facility], [place] [address] [place], [place] [address]    Report Status PENDING  Incomplete     Radiology Studies:  DG Chest Port 1 View  Result Date: 01/21/2019 CLINICAL DATA:  Left pleural effusion. Status post thoracentesis. EXAM: PORTABLE CHEST 1 VIEW 10:19 a.m. COMPARISON:  01/21/2019 at 3:13 a.m. FINDINGS: Endotracheal tube and NG tube appear unchanged. Significant decrease in the left pleural effusion with only a minimal effusion atelectasis remaining at the left lung base. No pneumothorax. Right lung is clear. Heart size and vascularity are normal. IMPRESSION: 1. Marked decrease in the left pleural effusion. No pneumothorax. 2. Minimal residual atelectasis at the left lung base. Electronically Signed   By: Lorriane Shire M.D.   On: 01/21/2019 10:35   DG CHEST PORT 1 VIEW  Result Date: 01/21/2019 CLINICAL DATA:  Intubation and orogastric tube placement. EXAM: PORTABLE CHEST 1 VIEW COMPARISON:  Radiograph earlier this day. Chest CT yesterday. FINDINGS: Endotracheal tube tip 2.3 cm from the carina. Tip and side port of the enteric tube below the diaphragm in the stomach. Hazy left lung base opacity consistent with pleural effusion and atelectasis as seen on CT  yesterday. Streaky right lung base atelectasis. No new airspace disease. No pneumothorax. Surgical drains in the upper abdomen. IMPRESSION: 1. Endotracheal tube tip 2.3 cm from the carina. Enteric tube tip and side-port in the stomach. 2. Hazy left lung base opacity consistent with pleural effusion and atelectasis as seen on CT. Electronically Signed   By: Aurther Loft.D.  On: 01/21/2019 03:24   DG CHEST PORT 1 VIEW  Result Date: 01/21/2019 CLINICAL DATA:  Increased shortness of breath tonight. EXAM: PORTABLE CHEST 1 VIEW COMPARISON:  Chest CT yesterday. Radiograph 01/19/2019 FINDINGS: Left lung base opacity corresponding to pleural effusion and atelectasis on CT. This is not significantly changed since prior exams. No visualized pneumothorax. Right lung is hypo aerated with minor atelectasis in the lung base. Previous right central line has been removed. Unchanged heart size and mediastinal contours. No acute osseous abnormalities are seen. Surgical drain in the left upper quadrant. IMPRESSION: Left lung base opacity corresponding to pleural fluid and atelectasis on CT. Overall findings are not significantly changed. Minimal streaky right lower lobe atelectasis. Electronically Signed   By: Keith Rake M.D.   On: 01/21/2019 01:57   [place] EKG SITE RITE  Result Date: 01/21/2019 If Site Rite image not attached, placement could not be confirmed due to current cardiac rhythm.  IR THORACENTESIS ASP PLEURAL SPACE W/IMG GUIDE  Result Date: 01/21/2019 INDICATION: Respiratory failure, left-sided pleural effusion. Request diagnostic and therapeutic thoracentesis. EXAM: ULTRASOUND GUIDED LEFT THORACENTESIS MEDICATIONS: None. COMPLICATIONS: None immediate. Postprocedural chest x-ray negative for pneumothorax. PROCEDURE: An ultrasound guided thoracentesis was thoroughly discussed with the patient and questions answered. The benefits, risks, alternatives and complications were also discussed. The patient  understands and wishes to proceed with the procedure. Written consent was obtained. Ultrasound was performed to localize and mark an adequate pocket of fluid in the left chest. The area was then prepped and draped in the normal sterile fashion. 1% Lidocaine was used for local anesthesia. Under ultrasound guidance a 6 Fr Safe-T-Centesis catheter was introduced. Thoracentesis was performed. The catheter was removed and a dressing applied. FINDINGS: A total of approximately 300 mL of clear yellow fluid was removed. Samples were sent to the laboratory as requested by the clinical team. IMPRESSION: Successful ultrasound guided left thoracentesis yielding 300 mL of pleural fluid. Read by: Ascencion Dike PA-C Electronically Signed   By: Jacqulynn Cadet M.D.   On: 01/21/2019 10:39     Scheduled Meds:   . sodium chloride   Intravenous Once  . chlorhexidine  15 mL Mouth Rinse BID  . Chlorhexidine Gluconate Cloth  6 each Topical Daily  . insulin aspart  0-20 Units Subcutaneous Q4H  . insulin glargine  16 Units Subcutaneous BID  . ipratropium  0.5 mg Nebulization Q6H  . levalbuterol  0.63 mg Nebulization Q6H  . lidocaine      . mouth rinse  15 mL Mouth Rinse Q2H  . metoprolol tartrate  25 mg Oral BID  . pantoprazole (PROTONIX) IV  40 mg Intravenous Q24H  . sodium chloride flush  10-40 mL Intracatheter Q12H  . sodium chloride flush  10-40 mL Intracatheter Q12H  . sodium chloride flush  10-40 mL Intracatheter Q12H    Continuous Infusions:   . sodium chloride    . albumin human 25 g (01/21/19 0804)  . ceFEPime (MAXIPIME) IV Stopped (01/21/19 0521)  . fentaNYL infusion INTRAVENOUS 50 mcg/hr (01/21/19 1128)  . methocarbamol (ROBAXIN) IV Stopped (01/16/19 1026)  . phenylephrine (NEO-SYNEPHRINE) Adult infusion 500 mcg/min (01/17/19 0125)  . ropivacaine (PF) 2 mg/mL (0.2%) Stopped (01/19/19 1052)  . TPN ADULT (ION)    . vancomycin 1,500 mg (01/20/19 1524)     LOS: 6 days     Vernell Leep, MD,  [place], [facility]. [facility]    To contact the attending provider between 7A-7P or the covering provider during after hours 7P-7A,  please log into the web site [email or web address] and access using universal [place] password for that web site. If you do not have the password, please call the hospital operator.  01/21/2019, 11:32 AM

## 2019-01-21 NOTE — Progress Notes (Signed)
Initial Nutrition Assessment  DOCUMENTATION CODES:   Morbid obesity  INTERVENTION:   TPN to advance to goal as tolerated.    NUTRITION DIAGNOSIS:   Increased nutrient needs related to post-op healing as evidenced by estimated needs.  GOAL:   Patient will meet greater than or equal to 90% of their needs  MONITOR:   I & O's  REASON FOR ASSESSMENT:   Consult, Ventilator New TPN/TNA  ASSESSMENT:   Pt with PMH of DM, diarrhea for 30 years s/p open chole, recent kidney stones and found to have new pancreatic cancer on follow up CT. S/p whipple 12/15.    Pt discussed during ICU rounds and with RN.  Pt developed hypoxia from L pleural effusion with atelectasis and possible PNA and required intubation. Pt deveoped post op ileus started TPN  Spoke with husband at bedside. He reports that patient ate well PTA and had no recent weight changes.   12/21 intubated, on pressors, NG to suction, s/p IR thoracentesis with 300 ml removed   Patient is currently intubated on ventilator support MV: 15.3 L/min Temp (24hrs), Avg:99 F (37.2 C), Min:97.8 F (36.6 C), Max:99.7 F (37.6 C)  Medications reviewed and include: lantus, SSI, Kphos x 1  Neo @ 500 mcg Labs reviewed: PO4: 2.3 CBG: 200 Abd drains x 3: 138 ml  NUTRITION - FOCUSED PHYSICAL EXAM:    Most Recent Value  Orbital Region  No depletion  Upper Arm Region  No depletion  Thoracic and Lumbar Region  No depletion  Buccal Region  Unable to assess  Temple Region  No depletion  Clavicle Bone Region  No depletion  Clavicle and Acromion Bone Region  No depletion  Scapular Bone Region  No depletion  Dorsal Hand  No depletion  Patellar Region  No depletion  Anterior Thigh Region  No depletion  Posterior Calf Region  No depletion  Edema (RD Assessment)  Moderate  Hair  Reviewed  Eyes  Unable to assess  Mouth  Unable to assess  Skin  Reviewed  Nails  Reviewed       Diet Order:   Diet Order    None       EDUCATION NEEDS:   Not appropriate for education at this time  Skin:  Skin Assessment: Reviewed RN Assessment  Last BM:     Height:   Ht Readings from Last 1 Encounters:  01/21/19 5\' 5"  (1.651 m)    Weight:   Wt Readings from Last 1 Encounters:  01/21/19 117.3 kg    Ideal Body Weight:  56.8 kg  BMI:  Body mass index is 43.03 kg/m.  Estimated Nutritional Needs:   Kcal:  1400-1600  Protein:  120-140 grams  Fluid:  >1.5 L/day  Maylon Peppers RD, LDN, CNSC [phone] Pager [phone] After Hours Pager

## 2019-01-21 NOTE — Progress Notes (Signed)
Writer paged internal medicine regarding patient's shortness of breath and difficulty breathing. Awaiting reply

## 2019-01-21 NOTE — Progress Notes (Addendum)
PHARMACY - TOTAL PARENTERAL NUTRITION CONSULT NOTE   Indication: Prolonged ileus  Patient Measurements: Height: 5\' 5"  (165.1 cm) Weight: 252 lb (114.3 kg) IBW/kg (Calculated) : 57 TPN AdjBW (KG): 69.6 Body mass index is 41.93 kg/m. Usual Weight: ~113.6 kg  Assessment:  48 yof with a neuroendocrine tumor of the pancreas admitted for surgery and is now post-op Whipple procedure and distal pancreatectomy on 12/15. Pathology is pending. She was started on clear liquids 12/18 with fair appetite and advanced to regular diet 12/20 however had respiratory distress and hypoxia overnight 12/20 requiring re-intubation. CT abd/pelvis shows likely SB ileus. Pharmacy consulted to start TPN.  Glucose / Insulin: hx DM, A1c 7.5, CBGs 134-296, 43 units SSI and Lantus 16 units bid over last 24h Electrolytes: K 3.3 - 40 meq PO given (goal >=4), Mg 2.3 (goal >=2), Phos 1.9 - 30 mmol NaPhos given 12/20, iCa 1.11 Renal: SCr 1.07 LFTs / TGs: LFTs WNL, TG 256 Prealbumin / albumin: albumin 3.8 Intake / Output; MIVF: Drain O/P 138 ml, stool O/P 250 ml, NGT O/P 650 ml; propofol at 60 mcg/kg/min but changing to fentanyl after discussion with CCM GI Imaging: 12/20 CT abd/pelvis - distended gas filled loops of colon and SB likely due to ileus, small amount pelvic free fluid, probable mild hepatic steatosis Surgeries / Procedures:  12/15 Diagnostic laparoscopy, classic pancreaticoduodenectomy, distal pancreatectomy, placement of biliary and pancreatic duct stent  12/21 IR thoracentesis yielding 300 ml clear yellow fluid  Central access: double lumen PICC placed 12/21 TPN start date: 12/21  Nutritional Goals (per RD recommendation on TBD): kCal: 1250-1550 kcal, Protein: 115-130g, Fluid: 2.1L Goal TPN rate is 90 mL/hr (provides 125 g of protein and 1308 kcals per day)  Current Nutrition:  NPO and TPN  Plan:  Start TPN at 74mL/hr at 1800 Hold lipid emulsion for first 7 days for critically ill patients per [facility]  guidelines (Start date 12/27) Electrolytes in TPN: 74mEq/L of Na, 57mEq/L of K, 72mEq/L of Ca, 44mEq/L of Mg, and 23mmol/L of Phos. Cl:Ac ratio 1:1 Add standard MVI (MWF) and trace elements to TPN Continue Resistant q4h SSI and adjust as needed, add 10 units insulin to TPN  Monitor TPN labs on Mon/Thurs, and prn  K Phos 10 mmol IV x1  Thank you for involving pharmacy in this patient's care.  Renold Genta, PharmD, BCPS Clinical Pharmacist Clinical phone for 01/21/2019 until 3p is [phone] 01/21/2019 9:23 AM  **Pharmacist phone directory can be found on [email or web address] listed under [place]**

## 2019-01-21 NOTE — Progress Notes (Signed)
Patient is complaining of difficulty breathing and shortness of breath. Breathing treatment given with minimal effect. Tried to page the MD but the number in [place]  Is not connecting. Will continue to try to contact the MD on call.

## 2019-01-21 NOTE — Progress Notes (Signed)
Pt with increasing WOB and o2 sats are beginning to fall  Despite elevating HOB , nebulizers and modification of medications   Left pleural effusion noted but continues to be tachycardic/ tachypneic  and  Heading toward respiratory failure   Will have anesthesia intubate pt at this point  May benefit from IR thoracentesis but overall condition is declining

## 2019-01-22 ENCOUNTER — Inpatient Hospital Stay (HOSPITAL_COMMUNITY): Payer: BC Managed Care – PPO

## 2019-01-22 DIAGNOSIS — J90 Pleural effusion, not elsewhere classified: Secondary | ICD-10-CM

## 2019-01-22 DIAGNOSIS — G4733 Obstructive sleep apnea (adult) (pediatric): Secondary | ICD-10-CM

## 2019-01-22 DIAGNOSIS — J189 Pneumonia, unspecified organism: Secondary | ICD-10-CM

## 2019-01-22 LAB — CYTOLOGY - NON PAP

## 2019-01-22 LAB — COMPREHENSIVE METABOLIC PANEL
ALT: 16 U/L (ref 0–44)
AST: 18 U/L (ref 15–41)
Albumin: 3.5 g/dL (ref 3.5–5.0)
Alkaline Phosphatase: 51 U/L (ref 38–126)
Anion gap: 11 (ref 5–15)
BUN: 23 mg/dL — ABNORMAL HIGH (ref 6–20)
CO2: 21 mmol/L — ABNORMAL LOW (ref 22–32)
Calcium: 8.1 mg/dL — ABNORMAL LOW (ref 8.9–10.3)
Chloride: 111 mmol/L (ref 98–111)
Creatinine, Ser: 0.9 mg/dL (ref 0.44–1.00)
GFR calc Af Amer: >60 mL/min (ref >60)
GFR calc non Af Amer: >60 mL/min (ref >60)
Glucose, Bld: 195 mg/dL — ABNORMAL HIGH (ref 70–99)
Potassium: 4 mmol/L (ref 3.5–5.1)
Sodium: 143 mmol/L (ref 135–145)
Total Bilirubin: 1.3 mg/dL — ABNORMAL HIGH (ref 0.3–1.2)
Total Protein: 6 g/dL — ABNORMAL LOW (ref 6.5–8.1)

## 2019-01-22 LAB — CBC
HCT: 27.7 % — ABNORMAL LOW (ref 36.0–46.0)
Hemoglobin: 8.6 g/dL — ABNORMAL LOW (ref 12.0–15.0)
MCH: 29.1 pg (ref 26.0–34.0)
MCHC: 31 g/dL (ref 30.0–36.0)
MCV: 93.6 fL (ref 80.0–100.0)
Platelets: 233 10*3/uL (ref 150–400)
RBC: 2.96 MIL/uL — ABNORMAL LOW (ref 3.87–5.11)
RDW: 16.4 % — ABNORMAL HIGH (ref 11.5–15.5)
WBC: 25.1 10*3/uL — ABNORMAL HIGH (ref 4.0–10.5)
nRBC: 3.5 % — ABNORMAL HIGH (ref 0.0–0.2)

## 2019-01-22 LAB — GLUCOSE, CAPILLARY
Glucose-Capillary: 209 mg/dL — ABNORMAL HIGH (ref 70–99)
Glucose-Capillary: 185 mg/dL — ABNORMAL HIGH (ref 70–99)
Glucose-Capillary: 230 mg/dL — ABNORMAL HIGH (ref 70–99)
Glucose-Capillary: 282 mg/dL — ABNORMAL HIGH (ref 70–99)
Glucose-Capillary: 257 mg/dL — ABNORMAL HIGH (ref 70–99)
Glucose-Capillary: 229 mg/dL — ABNORMAL HIGH (ref 70–99)

## 2019-01-22 LAB — DIFFERENTIAL
Abs Immature Granulocytes: 2.63 10*3/uL — ABNORMAL HIGH (ref 0.00–0.07)
Basophils Absolute: 0.2 10*3/uL — ABNORMAL HIGH (ref 0.0–0.1)
Basophils Relative: 1 %
Eosinophils Absolute: 0.4 10*3/uL (ref 0.0–0.5)
Eosinophils Relative: 2 %
Immature Granulocytes: 11 %
Lymphocytes Relative: 6 %
Lymphs Abs: 1.5 10*3/uL (ref 0.7–4.0)
Monocytes Absolute: 1.7 10*3/uL — ABNORMAL HIGH (ref 0.1–1.0)
Monocytes Relative: 7 %
Neutro Abs: 18.7 10*3/uL — ABNORMAL HIGH (ref 1.7–7.7)
Neutrophils Relative %: 73 %
WBC Morphology: INCREASED

## 2019-01-22 LAB — BPAM RBC
Blood Product Expiration Date: 202101152359
Blood Product Expiration Date: 202101162359
ISSUE DATE / TIME: 202012211149
ISSUE DATE / TIME: 202012211412
Unit Type and Rh: 5100
Unit Type and Rh: 5100

## 2019-01-22 LAB — MAGNESIUM: Magnesium: 2.3 mg/dL (ref 1.7–2.4)

## 2019-01-22 LAB — TYPE AND SCREEN
ABO/RH(D): O POS
Antibody Screen: NEGATIVE
Unit division: 0
Unit division: 0

## 2019-01-22 LAB — TRIGLYCERIDES: Triglycerides: 241 mg/dL — ABNORMAL HIGH (ref <150)

## 2019-01-22 LAB — PREALBUMIN: Prealbumin: 8.7 mg/dL — ABNORMAL LOW (ref 18–38)

## 2019-01-22 LAB — PHOSPHORUS: Phosphorus: 2.5 mg/dL (ref 2.5–4.6)

## 2019-01-22 MED ORDER — SODIUM PHOSPHATES 45 MMOLE/15ML IV SOLN
10.0000 mmol | Freq: Once | INTRAVENOUS | Status: AC
  Administered 2019-01-22: 10 mmol via INTRAVENOUS
  Filled 2019-01-22: qty 3.33

## 2019-01-22 MED ORDER — METOPROLOL TARTRATE 5 MG/5ML IV SOLN: 5.0000 mg | Freq: Four times a day (QID) | INTRAVENOUS | Status: DC | PRN

## 2019-01-22 MED ORDER — SODIUM CHLORIDE 0.9 % IV SOLN: INTRAVENOUS | Status: DC | PRN

## 2019-01-22 MED ORDER — TRAVASOL 10 % IV SOLN
INTRAVENOUS | Status: AC
  Filled 2019-01-22: qty 1008

## 2019-01-22 MED ORDER — TRAVASOL 10 % IV SOLN
INTRAVENOUS | Status: DC
  Filled 2019-01-22: qty 1008

## 2019-01-22 MED ORDER — ENOXAPARIN SODIUM 40 MG/0.4ML ~~LOC~~ SOLN
40.0000 mg | SUBCUTANEOUS | Status: DC
  Administered 2019-01-22 – 2019-01-30 (×9): 40 mg via SUBCUTANEOUS
  Filled 2019-01-22 (×10): qty 0.4

## 2019-01-22 MED ORDER — INSULIN GLARGINE 100 UNIT/ML ~~LOC~~ SOLN
20.0000 [IU] | Freq: Two times a day (BID) | SUBCUTANEOUS | Status: DC
  Administered 2019-01-22 – 2019-01-29 (×14): 20 [IU] via SUBCUTANEOUS
  Filled 2019-01-22 (×18): qty 0.2

## 2019-01-22 MED ORDER — INSULIN GLARGINE 100 UNIT/ML ~~LOC~~ SOLN
4.0000 [IU] | Freq: Once | SUBCUTANEOUS | Status: AC
  Administered 2019-01-22: 4 [IU] via SUBCUTANEOUS
  Filled 2019-01-22: qty 0.04

## 2019-01-22 NOTE — Progress Notes (Signed)
Nutrition Follow-up  DOCUMENTATION CODES:   Morbid obesity  INTERVENTION:   TPN to advance to goal as tolerated.    NUTRITION DIAGNOSIS:   Increased nutrient needs related to post-op healing as evidenced by estimated needs. Ongoing.   GOAL:   Patient will meet greater than or equal to 90% of their needs Progressing  MONITOR:   I & O's  REASON FOR ASSESSMENT:   Consult, Ventilator New TPN/TNA  ASSESSMENT:   Pt with PMH of DM, diarrhea for 30 years s/p open chole, recent kidney stones and found to have new pancreatic cancer on follow up CT. S/p whipple 12/15.    Pt discussed during ICU rounds and with RN.  Pt developed hypoxia from L pleural effusion with atelectasis and possible PNA and required intubation. Pt deveoped post op ileus started TPN  12/21 intubated, on pressors, NG to suction, s/p IR thoracentesis with 300 ml removed  12/22 pt extubated, NG placed for decompression    Medications reviewed and include: lantus, SSI Labs reviewed:  CBG: 185-230 NG output: 700 ml  Abd drains x 3: 190 ml  TPN increased to 60 ml/hr with lipids: 1644 and 135 grams protein  Diet Order:   Diet Order    None      EDUCATION NEEDS:   Not appropriate for education at this time  Skin:  Skin Assessment: Reviewed RN Assessment  Last BM:  (P) 12/14  Height:   Ht Readings from Last 1 Encounters:  01/21/19 5\' 5"  (1.651 m)    Weight:   Wt Readings from Last 1 Encounters:  01/22/19 120.1 kg    Ideal Body Weight:  56.8 kg  BMI:  Body mass index is 44.06 kg/m.  Estimated Nutritional Needs:   Kcal:  2100-2300  Protein:  110-125 grams  Fluid:  2 L/day  Maylon Peppers RD, LDN, CNSC [phone] Pager [phone] After Hours Pager

## 2019-01-22 NOTE — Progress Notes (Signed)
Physical Therapy Treatment Patient Details Name: Leah Farmer MRN: [id number] DOB: [birth year] Today's Date: 01/22/2019    History of Present Illness pt is a 53 y/o female with pmh significant for HTN, arthritis, DM admitted with primary malignant pancreatic tumors x2, s/p whipple procedure.    PT Comments    Pt anxious about getting OOB, but calmed down when up at EOB,  VSS throughout.  Worked on transitions, sit to stand and pre-gait in the RW , determining if R ankle would roll with decrease stability and transfers to the recliner.    Follow Up Recommendations  CIR     Equipment Recommendations  Rolling walker with 5" wheels    Recommendations for Other Services Rehab consult     Precautions / Restrictions Precautions Precautions: Fall Precaution Comments: 3 JP drains    Mobility  Bed Mobility Overal bed mobility: Needs Assistance Bed Mobility: Rolling;Sidelying to Sit Rolling: Mod assist Sidelying to sit: Mod assist;Max assist       General bed mobility comments: cues for sequencing, minimal to moderate assist for legs and more assist to come up from L elbow to sit.  Transfers Overall transfer level: Needs assistance Equipment used: Rolling walker (2 wheeled);None Transfers: Sit to/from [facility] Sit to Stand: Mod assist;+2 safety/equipment Stand pivot transfers: Min assist;+2 physical assistance          Ambulation/Gait             General Gait Details: pivotal and marching in place steps to observe R ankle stability   Stairs             Wheelchair Mobility    Modified Rankin (Stroke Patients Only)       Balance Overall balance assessment: Needs assistance Sitting-balance support: No upper extremity supported;Single extremity supported Sitting balance-[name]: Fair     Standing balance support: Bilateral upper extremity supported Standing balance-[name]: Poor Standing balance comment: pt work on  w/shifting, stepping and marching in place before pivoting over to the chair.                            Cognition Arousal/Alertness: Awake/alert;Lethargic Behavior During Therapy: WFL for tasks assessed/performed Overall Cognitive Status: Within Functional Limits for tasks assessed                                        Exercises Other Exercises Other Exercises: pt completed ROM warm up exercise for all 4 extremities.    General Comments General comments (skin integrity, edema, etc.): EHR climbing into upper 130's, sats in the upper 90's  Pt's husband to go het some [employer] shoes. for ankle stability.      Pertinent Vitals/Pain Pain Assessment: Faces Pain Score: 4  Pain Location: abdominal incisions Pain Descriptors / Indicators: Discomfort;Grimacing;Guarding    Home Living                      Prior Function            PT Goals (current goals can now be found in the care plan section) Acute Rehab PT Goals PT Goal Formulation: With patient Time For Goal Achievement: 01/31/19 Potential to Achieve Goals: Good Progress towards PT goals: Progressing toward goals    Frequency    Min 3X/week      PT Plan Current plan remains appropriate  Co-evaluation              AM-PAC PT "6 Clicks" Mobility   Outcome Measure  Help needed turning from your back to your side while in a flat bed without using bedrails?: A Lot Help needed moving from lying on your back to sitting on the side of a flat bed without using bedrails?: A Lot Help needed moving to and from a bed to a chair (including a wheelchair)?: A Lot Help needed standing up from a chair using your arms (e.g., wheelchair or bedside chair)?: A Lot Help needed to walk in hospital room?: Total Help needed climbing 3-5 steps with a railing? : Total 6 Click Score: 10    End of Session   Activity Tolerance: Patient limited by lethargy     PT Visit Diagnosis: Pain;Other  abnormalities of gait and mobility (R26.89);Muscle weakness (generalized) (M62.81) Pain - part of body: (abdominal iscision)     Time: [id number] PT Time Calculation (min) (ACUTE ONLY): 36 min  Charges:  $Gait Training: 8-22 mins $Therapeutic Activity: 8-22 mins                     01/22/2019  Leah Farmer., PT Acute Rehabilitation Services [phone]  (pager) [phone]  (office)   Leah Farmer 01/22/2019, 5:30 PM

## 2019-01-22 NOTE — Progress Notes (Signed)
PHARMACY - TOTAL PARENTERAL NUTRITION CONSULT NOTE   Indication: Prolonged ileus  Patient Measurements: Height: 5\' 5"  (165.1 cm) Weight: 264 lb 12.4 oz (120.1 kg) IBW/kg (Calculated) : 57 TPN AdjBW (KG): 69.6 Body mass index is 44.06 kg/m. Usual Weight: ~113.6 kg  Assessment:  62 yof with a neuroendocrine tumor of the pancreas admitted for surgery and is now post-op Whipple procedure and distal pancreatectomy on 12/15. Pathology is pending. She was started on clear liquids 12/18 with fair appetite and advanced to regular diet 12/20 however had respiratory distress and hypoxia overnight 12/20 requiring re-intubation. CT abd/pelvis shows likely SB ileus.  Glucose / Insulin: hx DM, A1c 7.5, CBGs 200s, 43 units SSI and Lantus 16 units bid over last 24h Electrolytes: K 4 (goal >=4), Mg 2.3 (goal >=2), Phos 2.5 Renal: SCr 0.9 LFTs / TGs: LFTs WNL, TG 256>241 Prealbumin / albumin: albumin 3.5 Intake / Output; MIVF: Drain O/P 138>1890 ml, NGT O/P 650>700 ml; UOP 0.3 ml/kg/hr GI Imaging: 12/20 CT abd/pelvis - distended gas filled loops of colon and SB likely due to ileus, small amount pelvic free fluid, probable mild hepatic steatosis Surgeries / Procedures:  12/15 Diagnostic laparoscopy, classic pancreaticoduodenectomy, distal pancreatectomy, placement of biliary and pancreatic duct stent  12/21 IR thoracentesis yielding 300 ml clear yellow fluid  Central access: double lumen PICC placed 12/21 TPN start date: 12/21  Nutritional Goals (per RD recommendation on 12/22): kCal: 1400 - 1600 kcal, Protein: 120 - 140 g, Fluid: > 1.5 L/day Goal TPN rate is 80 mL/hr (provides 135 g of protein and 1644 kcals per day)  Current Nutrition:  NPO and TPN  Plan:  Advance TPN at to 60 mL/hr at 1800 (add lipids) Electrolytes in TPN: 32mEq/L of Na, 50mEq/L of K, 98mEq/L of Ca, 65mEq/L of Mg, and 49mmol/L of Phos. - no changes today; Cl:Ac 1:2  Add standard MVI (MWF) and trace elements to TPN Continue  Resistant q4h SSI and adjust as needed, continue10 units insulin to TPN - increased lantus to 20 bid per MD today Monitor TPN labs on Mon/Thurs, and prn  Barth Kirks, PharmD, BCPS, BCCCP Clinical Pharmacist [phone]  Please check AMION for all [place] numbers  01/22/2019 9:57 AM

## 2019-01-22 NOTE — Progress Notes (Signed)
NAME:  Leah Farmer, MRN:  [id number], DOB:  03/03/1965, LOS: 7 ADMISSION DATE:  01/15/2019, CONSULTATION DATE:  01/21/19 REFERRING MD:  Barry Dienes  CHIEF COMPLAINT:  Vent Management   Brief History   Leah Farmer is a 53 y.o. female who underwent Whipple 12/15 for neuroendocrine tumor of pancreas.  Post op course c/b resp failure overnight 12/20 requiring re-intubation. PCCM asked to assist with vent management.  History of present illness   Pt is encephelopathic; therefore, this HPI is obtained from chart review. Leah Farmer is a 53 y.o. female who has a PMH including but not limited to neuroendocrine tumor of the pancreas s/p diagnostic laparoscopy, classic pancreaticoduodenectomy, distal pancreatectomy, placement of biliary and pancreatic duct stents 01/15/19 (Dr. Barry Dienes).  Post op course was c/b postop ileus, sinus tach, ABLA. Evening of 12/20, she developed respiratory distress and was intubated by anesthesia.  PCCM asked to see in consultation 12/21 for vent management.  Past Medical History  has Nausea & vomiting; Acute flank pain; Flank pain; COVID-19 virus infection; Nephrolithiasis; Hydronephrosis, left; Acute lower UTI; Tachycardia; CAP (community acquired pneumonia); Hypokalemia; Abnormal liver function; Encounter for screening colonoscopy; Pancreatic tumor; Pancreatic cancer ([place]); Primary malignant neuroendocrine neoplasm of pancreas ([place]); Diabetes mellitus type 2 in obese ([place]); HCAP (healthcare-associated pneumonia); Essential hypertension; Depression; Pleural effusion, left; and Acute respiratory failure with hypoxia (H[facility]) on their problem list.  Significant Hospital Events   12/15 > diagnostic laparoscopy, classic pancreaticoduodenectomy, distal pancreatectomy, placement of biliary and pancreatic duct stents 01/15/19 (Dr. Barry Dienes) 12/20 > respiratory distress requiring re-intubation. 12/21 > left thora by IR  Consults:  PCCM.  Procedures:  ETT 12/20 >    Significant Diagnostic Tests:  CT chest / abd / pelv 12/20 > post op changes as expected.  Mod left pleural effusion with atx.  Distended gas filled loops of colon and SB likely due to ileus, small amount pelvic free fluid, probabel mild hepatic steatosis.  Micro Data:  Blood 12/19 >   Antimicrobials:  Cefepime 12/19 >  Vanc 12/19 >    Interim history/subjective:  Awake on vent, following commands.  PSV 5/5 and tolerating well.  Objective:  Blood pressure (!) 152/94, pulse (!) 134, temperature 99.8 F (37.7 C), temperature source Axillary, resp. rate 16, height 5\' 5"  (1.651 m), weight 120.1 kg, last menstrual period 01/12/2019, SpO2 99 %.    Vent Mode: CPAP;PSV FiO2 (%):  [40 %] 40 % Set Rate:  [16 bmp] 16 bmp Vt Set:  [450 mL] 450 mL PEEP:  [5 cmH20] 5 cmH20 Pressure Support:  [5 cmH20] 5 cmH20   Intake/Output Summary (Last 24 hours) at 01/22/2019 [id number] Last data filed at 01/22/2019 0900 Gross per 24 hour  Intake 4312.28 ml  Output 1860 ml  Net 2452.28 ml   Filed Weights   01/15/19 0612 01/21/19 1004 01/22/19 0318  Weight: 114.3 kg 117.3 kg 120.1 kg    Examination: General: Adult female, in NAD. Neuro: Awake, follows commands. HEENT: [place]/AT. Sclerae anicteric.  ETT in place. Cardiovascular: RRR, no M/R/G.  Lungs: Respirations even and unlabored.  CTA bilaterally, No W/R/R.  Abdomen: Obese.  Abdominal incisions C/D/I.  LLQ drains noted.  BS absent, soft, NT/ND. OG with bilious drainage. Musculoskeletal: No gross deformities, no edema.  Skin: Intact, warm, no rashes.  Assessment & Plan:   Neuroendocrine tumor of the pancreas - s/p diagnostic laparoscopy, classic pancreaticoduodenectomy, distal pancreatectomy, placement of biliary and pancreatic duct stents 01/15/19 (Dr. Barry Dienes). - Post op care per surgery.  Acute hypoxic  respiratory failure. - Extubate today. - Continue routine bronchial hygiene.  Left exudative pleural effusion with basilar atx - s/p thora 12/21  by IR with 363ml yellow fluid removed. - F/u cultures, cytology. - Push IS.  OSA (reports she uses nocturnal CPAP). - Ensure continued nocturnal CPAP to hopefully avoid recurrent resp distress and possible re-intubation.  Possible PNA. - Continue abx. - Assess sputum culture.  DM2. - SSI. - DM coordinator for counseling / education once ready for discharge.  Hx HTN. - Continue metpprolol. - Continue to hold lisinopril / HCTZ.  Post-op ileus. - Per primary. - Likely needs NGT post extubation.  ABLA - pending 2u PRBC 12/21. - Transfuse for Hgb < 7.  Prolonged QTc (548, ? Accuracy due to incomplete BBB). - Maintain K > 4, Mg > 2.   Plans for extubation this AM.  TRH to resume medical care in AM 12/23.  PCCM will sign off.  Please do not hesitate to call us back if we can be of any further assistance.   Best Practice:  Diet: TPN. Pain/Anxiety/Delirium protocol (if indicated): Discontinue with extubation. VAP protocol (if indicated): Discontinue with extubation. DVT prophylaxis: SCD's. GI prophylaxis: PPI. Glucose control: SSI, lantus. Mobility: Bedrest. Code Status: Full. Family Communication: None available. Disposition: ICU.   Critical care time: 30 min.    Montey Hora, [facility] 01/22/2019, 9:27 AM

## 2019-01-22 NOTE — Procedures (Signed)
Extubation Procedure Note  Patient Details:   Name: Leah Farmer DOB: 11/12/1965 MRN: [id number]   Airway Documentation:    Vent end date: 01/22/19 Vent end time: 0946   Evaluation  O2 sats: stable throughout Complications: No apparent complications Patient did tolerate procedure well. Bilateral Breath Sounds: Diminished   Yes, Pt could speak post extubation.  Pt extubated to 4 l/m [name] per physician's order.  Earney Navy 01/22/2019, 9:47 AM

## 2019-01-22 NOTE — Progress Notes (Signed)
Trauma Critical Care Follow Up Note  Subjective:    Overnight Issues: NAEON, L thoracentesis yesterday with 300cc o/p. PICC placed yesterday.  Objective:  Vital signs for last 24 hours: Temp:  [98.4 F (36.9 C)-100.4 F (38 C)] 99.8 F (37.7 C) (12/22 0400) Pulse Rate:  [107-137] 134 (12/22 0900) Resp:  [6-31] 16 (12/22 0900) BP: (123-158)/(70-111) 152/94 (12/22 0900) SpO2:  [96 %-100 %] 99 % (12/22 0900) FiO2 (%):  [40 %] 40 % (12/22 0823) Weight:  [117.3 kg-120.1 kg] 120.1 kg (12/22 0318)  Hemodynamic parameters for last 24 hours:    Intake/Output from previous day: 12/21 0701 - 12/22 0700 In: 4180.5 [I.V.:2211.2; Blood:630; IV Piggyback:1339.3] Out: 1860 [Urine:970; Emesis/NG output:700; Drains:190]  Intake/Output this shift: Total I/O In: 165.7 [I.V.:165.7] Out: -   Vent settings for last 24 hours: Vent Mode: CPAP;PSV FiO2 (%):  [40 %] 40 % Set Rate:  [16 bmp] 16 bmp Vt Set:  [450 mL] 450 mL PEEP:  [5 cmH20] 5 cmH20 Pressure Support:  [5 cmH20] 5 cmH20  Physical Exam:  Gen: comfortable, no distress Neuro: grossly non-focal, follows commands HEENT: intubated Neck: supple CV: RRR Pulm: unlabored breathing, mechanically ventilated Abd: soft, nontender GU: clear, yellow urine Extr: wwp, no edema   Results for orders placed or performed during the hospital encounter of 01/15/19 (from the past 24 hour(s))  Culture, respiratory (non-expectorated)     Status: None (Preliminary result)   Collection Time: 01/21/19 10:32 AM   Specimen: Tracheal Aspirate; Respiratory  Result Value Ref Range   Specimen Description TRACHEAL ASPIRATE    Special Requests NONE    Gram Stain RARE WBC PRESENT, PREDOMINANTLY PMN RARE YEAST     Culture      CULTURE REINCUBATED FOR BETTER GROWTH Performed at [facility], [place] [address], [place], [place] [address]    Report Status PENDING   Culture, body fluid-bottle     Status: None (Preliminary result)   Collection Time:  01/21/19 10:39 AM   Specimen: Pleura  Result Value Ref Range   Specimen Description PLEURAL LEFT    Special Requests NONE    Culture      NO GROWTH < 24 HOURS Performed at [facility], [place] [address] [place], [place] [address]    Report Status PENDING   Gram stain     Status: None   Collection Time: 01/21/19 10:39 AM   Specimen: Pleura  Result Value Ref Range   Specimen Description PLEURAL LEFT    Special Requests NONE    Gram Stain      FEW WBC PRESENT, PREDOMINANTLY PMN NO ORGANISMS SEEN Performed at [facility], [place] [address] [place], [place] [address]    Report Status 01/21/2019 FINAL   Lactate dehydrogenase (pleural or peritoneal fluid)     Status: Abnormal   Collection Time: 01/21/19 11:11 AM  Result Value Ref Range   LD, Fluid 544 (H) 3 - 23 U/L   Fluid Type-FLDH Pleural, L   Glucose, pleural or peritoneal fluid     Status: None   Collection Time: 01/21/19 11:11 AM  Result Value Ref Range   Glucose, Fluid 220 mg/dL   Fluid Type-FGLU Pleural, L   Protein, pleural or peritoneal fluid     Status: None   Collection Time: 01/21/19 11:11 AM  Result Value Ref Range   Total protein, fluid 3.6 g/dL   Fluid Type-FTP Pleural, L   Body fluid cell count with differential  Status: Abnormal   Collection Time: 01/21/19 11:11 AM  Result Value Ref Range   Fluid Type-FCT PLEU LEFT    Color, Fluid YELLOW YELLOW   Appearance, Fluid CLOUDY (A) CLEAR   Total Nucleated Cell Count, Fluid 2,750 (H) 0 - 1,000 cu mm   Neutrophil Count, Fluid 91 (H) 0 - 25 %   Lymphs, Fluid 2 %   Monocyte-Macrophage-Serous Fluid 6 (L) 50 - 90 %   Eos, Fluid 1 %   Other Cells, Fluid MESOTHELIAL CELLS PRESENT. %  Glucose, capillary     Status: Abnormal   Collection Time: 01/21/19 11:34 AM  Result Value Ref Range   Glucose-Capillary 199 (H) 70 - 99 mg/dL   Comment 1 Notify RN    Comment 2 Document in Chart   Glucose, capillary     Status: Abnormal   Collection Time: 01/21/19  3:31  PM  Result Value Ref Range   Glucose-Capillary 203 (H) 70 - 99 mg/dL   Comment 1 Notify RN    Comment 2 Document in Chart   Glucose, capillary     Status: Abnormal   Collection Time: 01/21/19  7:47 PM  Result Value Ref Range   Glucose-Capillary 230 (H) 70 - 99 mg/dL  Glucose, capillary     Status: Abnormal   Collection Time: 01/21/19 11:20 PM  Result Value Ref Range   Glucose-Capillary 270 (H) 70 - 99 mg/dL  Glucose, capillary     Status: Abnormal   Collection Time: 01/22/19  3:21 AM  Result Value Ref Range   Glucose-Capillary 209 (H) 70 - 99 mg/dL  CBC     Status: Abnormal   Collection Time: 01/22/19  4:14 AM  Result Value Ref Range   WBC 25.1 (H) 4.0 - 10.5 K/uL   RBC 2.96 (L) 3.87 - 5.11 MIL/uL   Hemoglobin 8.6 (L) 12.0 - 15.0 g/dL   HCT 27.7 (L) 36.0 - 46.0 %   MCV 93.6 80.0 - 100.0 fL   MCH 29.1 26.0 - 34.0 pg   MCHC 31.0 30.0 - 36.0 g/dL   RDW 16.4 (H) 11.5 - 15.5 %   Platelets 233 150 - 400 K/uL   nRBC 3.5 (H) 0.0 - 0.2 %  Prealbumin     Status: Abnormal   Collection Time: 01/22/19  4:14 AM  Result Value Ref Range   Prealbumin 8.7 (L) 18 - 38 mg/dL  Differential     Status: Abnormal   Collection Time: 01/22/19  4:14 AM  Result Value Ref Range   Neutrophils Relative % 73 %   Neutro Abs 18.7 (H) 1.7 - 7.7 K/uL   Lymphocytes Relative 6 %   Lymphs Abs 1.5 0.7 - 4.0 K/uL   Monocytes Relative 7 %   Monocytes Absolute 1.7 (H) 0.1 - 1.0 K/uL   Eosinophils Relative 2 %   Eosinophils Absolute 0.4 0.0 - 0.5 K/uL   Basophils Relative 1 %   Basophils Absolute 0.2 (H) 0.0 - 0.1 K/uL   WBC Morphology INCREASED BANDS (>20% BANDS)    Immature Granulocytes 11 %   Abs Immature Granulocytes 2.63 (H) 0.00 - 0.07 K/uL  Triglycerides     Status: Abnormal   Collection Time: 01/22/19  4:15 AM  Result Value Ref Range   Triglycerides 241 (H) <150 mg/dL  Comprehensive metabolic panel     Status: Abnormal   Collection Time: 01/22/19  5:45 AM  Result Value Ref Range   Sodium 143 135  - 145 mmol/L   Potassium  4.0 3.5 - 5.1 mmol/L   Chloride 111 98 - 111 mmol/L   CO2 21 (L) 22 - 32 mmol/L   Glucose, Bld 195 (H) 70 - 99 mg/dL   BUN 23 (H) 6 - 20 mg/dL   Creatinine, Ser 0.90 0.44 - 1.00 mg/dL   Calcium 8.1 (L) 8.9 - 10.3 mg/dL   Total Protein 6.0 (L) 6.5 - 8.1 g/dL   Albumin 3.5 3.5 - 5.0 g/dL   AST 18 15 - 41 U/L   ALT 16 0 - 44 U/L   Alkaline Phosphatase 51 38 - 126 U/L   Total Bilirubin 1.3 (H) 0.3 - 1.2 mg/dL   GFR calc non Af Amer >60 >60 mL/min   GFR calc Af Amer >60 >60 mL/min   Anion gap 11 5 - 15  Magnesium     Status: None   Collection Time: 01/22/19  5:45 AM  Result Value Ref Range   Magnesium 2.3 1.7 - 2.4 mg/dL  Phosphorus     Status: None   Collection Time: 01/22/19  5:45 AM  Result Value Ref Range   Phosphorus 2.5 2.5 - 4.6 mg/dL  Glucose, capillary     Status: Abnormal   Collection Time: 01/22/19  7:48 AM  Result Value Ref Range   Glucose-Capillary 185 (H) 70 - 99 mg/dL    Assessment & Plan: Present on Admission: . Primary malignant neuroendocrine neoplasm of pancreas ([place]) . Diabetes mellitus type 2 in obese ([place]) . HCAP (healthcare-associated pneumonia) . Essential hypertension . Depression    LOS: 7 days   Additional comments:I reviewed the patient's new clinical lab test results.   and I reviewed the patients new imaging test results.    s/pProcedure(s): WHIPPLE PROCEDURE (N/A) LAPAROSCOPY DIAGNOSTIC (N/A) DISTAL PANCREATECTOMY (N/A) Neuroendocrine cancer of the pancreas. Await pathology. Multifocal.   S/p Whipple - 12/15 Dr. Barry Dienes. Drains with high amylase x2, but will leave all three in place (20,10,160cc o/p) ABL anemia - appropriate response to 2u pRBC yesterday DM - 71u total in the last 24h, on resistant SSI and glargine but still hyperglycemic. Increase glargine to 20u BID and SSI to remain. TRH to follow. Leukocytosis/ID -afebrile, WBC down to 25 today. CXR suspicious for pneumonia and new L infiltrate today.  Vanc/cefepime started 12/20.  IR thoracentesis 12/21, cx NGTD, but pleural fluid c/w exudative effusion. Blood cx pending, NGTD. Reintubated 12/20, resp cx 12/21 re-incubated.CT C/A/P 12/20 with peri-pancreatic 3.6x7.6x4.6cm fluid collection is not formed and no gas present.  Vent dependent respiratory failure - PSV trial this AM, likely extubate today. F/E/N - ileus; on TPN, NGT to remain--700cc bilious o/p x24h, replete phos Acidosis - likely related to blood loss/underresuscitation. CO2 nearly normal.  AKI - resolved Elevated LFTs - resolved  Critical Care Total Time: 45 minutes  Jesusita Oka, MD Trauma & General Surgery Please use [email or web address] to contact on call provider  01/22/2019  *Care during the described time interval was provided by me. I have reviewed this patient's available data, including medical history, events of note, physical examination and test results as part of my evaluation.

## 2019-01-23 ENCOUNTER — Inpatient Hospital Stay (HOSPITAL_COMMUNITY): Payer: BC Managed Care – PPO

## 2019-01-23 DIAGNOSIS — D490 Neoplasm of unspecified behavior of digestive system: Secondary | ICD-10-CM

## 2019-01-23 LAB — COMPREHENSIVE METABOLIC PANEL
ALT: 17 U/L (ref 0–44)
AST: 21 U/L (ref 15–41)
Albumin: 2.7 g/dL — ABNORMAL LOW (ref 3.5–5.0)
Alkaline Phosphatase: 62 U/L (ref 38–126)
Anion gap: 10 (ref 5–15)
BUN: 24 mg/dL — ABNORMAL HIGH (ref 6–20)
CO2: 22 mmol/L (ref 22–32)
Calcium: 8.3 mg/dL — ABNORMAL LOW (ref 8.9–10.3)
Chloride: 112 mmol/L — ABNORMAL HIGH (ref 98–111)
Creatinine, Ser: 0.88 mg/dL (ref 0.44–1.00)
GFR calc Af Amer: >60 mL/min (ref >60)
GFR calc non Af Amer: >60 mL/min (ref >60)
Glucose, Bld: 270 mg/dL — ABNORMAL HIGH (ref 70–99)
Potassium: 3.6 mmol/L (ref 3.5–5.1)
Sodium: 144 mmol/L (ref 135–145)
Total Bilirubin: 1.1 mg/dL (ref 0.3–1.2)
Total Protein: 5.8 g/dL — ABNORMAL LOW (ref 6.5–8.1)

## 2019-01-23 LAB — CBC
HCT: 28.2 % — ABNORMAL LOW (ref 36.0–46.0)
Hemoglobin: 8.7 g/dL — ABNORMAL LOW (ref 12.0–15.0)
MCH: 28.8 pg (ref 26.0–34.0)
MCHC: 30.9 g/dL (ref 30.0–36.0)
MCV: 93.4 fL (ref 80.0–100.0)
Platelets: 265 10*3/uL (ref 150–400)
RBC: 3.02 MIL/uL — ABNORMAL LOW (ref 3.87–5.11)
RDW: 16.9 % — ABNORMAL HIGH (ref 11.5–15.5)
WBC: 22.1 10*3/uL — ABNORMAL HIGH (ref 4.0–10.5)
nRBC: 2.1 % — ABNORMAL HIGH (ref 0.0–0.2)

## 2019-01-23 LAB — GLUCOSE, CAPILLARY
Glucose-Capillary: 198 mg/dL — ABNORMAL HIGH (ref 70–99)
Glucose-Capillary: 236 mg/dL — ABNORMAL HIGH (ref 70–99)
Glucose-Capillary: 260 mg/dL — ABNORMAL HIGH (ref 70–99)
Glucose-Capillary: 261 mg/dL — ABNORMAL HIGH (ref 70–99)
Glucose-Capillary: 275 mg/dL — ABNORMAL HIGH (ref 70–99)
Glucose-Capillary: 305 mg/dL — ABNORMAL HIGH (ref 70–99)

## 2019-01-23 LAB — PHOSPHORUS: Phosphorus: 1.4 mg/dL — ABNORMAL LOW (ref 2.5–4.6)

## 2019-01-23 LAB — MAGNESIUM: Magnesium: 2.1 mg/dL (ref 1.7–2.4)

## 2019-01-23 LAB — SURGICAL PATHOLOGY

## 2019-01-23 MED ORDER — POTASSIUM PHOSPHATES 15 MMOLE/5ML IV SOLN
30.0000 mmol | Freq: Once | INTRAVENOUS | Status: AC
  Administered 2019-01-23: 30 mmol via INTRAVENOUS
  Filled 2019-01-23: qty 10

## 2019-01-23 MED ORDER — MORPHINE SULFATE (PF) 2 MG/ML IV SOLN
2.0000 mg | INTRAVENOUS | Status: DC | PRN
  Administered 2019-01-23 – 2019-01-25 (×9): 2 mg via INTRAVENOUS
  Administered 2019-01-25: 3 mg via INTRAVENOUS
  Administered 2019-01-25 – 2019-01-28 (×10): 2 mg via INTRAVENOUS
  Filled 2019-01-23 (×8): qty 1
  Filled 2019-01-23: qty 2
  Filled 2019-01-23 (×13): qty 1

## 2019-01-23 MED ORDER — METOPROLOL TARTRATE 25 MG PO TABS
25.0000 mg | ORAL_TABLET | Freq: Two times a day (BID) | ORAL | Status: DC
  Administered 2019-01-23 – 2019-01-31 (×17): 25 mg
  Filled 2019-01-23 (×17): qty 1

## 2019-01-23 MED ORDER — TRAVASOL 10 % IV SOLN
INTRAVENOUS | Status: AC
  Filled 2019-01-23: qty 532.8

## 2019-01-23 NOTE — Progress Notes (Signed)
PROGRESS NOTE    Leah Farmer  [id number] DOB: 30-Mar-1965 DOA: 01/15/2019 PCP: Margy Clarks, NP   Brief Narrative: Leah Farmer is a 53 y.o. female, PMH of type II DM, HTN, GERD, anxiety and depression, NAFL, primary malignant neuroendocrine tumor of the pancreas x2, s/p diagnostic laparoscopy, classic pancreatico duodenectomy, distal pancreatectomy, placement of biliary and pancreatic duct stents by CCS on 12/15, TRH was consulted on 12/16 to assist with management of uncontrolled DM and pneumonia.  Postop course complicated by postop ileus, sinus tachycardia, expected acute blood loss anemia and electrolyte abnormalities in addition to respiratory failure requiring short term intubation.    Assessment & Plan:   Principal Problem:   Primary malignant neuroendocrine neoplasm of pancreas [facility]) Active Problems:   Pancreatic tumor   Diabetes mellitus type 2 in obese ([place])   HCAP (healthcare-associated pneumonia)   Essential hypertension   Depression   Pleural effusion, left   Acute respiratory failure with hypoxia (HCC)   OSA (obstructive sleep apnea)   Diabetes mellitus, type 2, uncontrolled with hyperglycemia Patient's A1C is 7.5% from 12/11. On Janumet XR as an outpatient. No worsened in setting of pancreatectomy and TPN. On Lantus 20 units BID, SSI and insulin in TPN -Continue Lantus and SSI -Discussed with pharmacy who will titrate insulin in TPN  Essential hypertension -Continue metoprolol  Sinus tachycardia Persistent. Patient is on metoprolol. Likely multifactorial.  -Continue telemetry for now  Possible pneumonia Chest x-ray concerning for possible pneumonia vs atelectasis. Associated leukocytosis in setting of recent surgery in addition to acute respiratory failure requiring intubation. Now extubated. -Continue Cefepime -Continue incentive spirometer  Acute respiratory failure with hypoxia Likely secondary to possible Pneumonia/atelectasis/effusions.  Required intubation briefly from 12/21 to 12/22. Now on room air.  Primary malignant neuroendocrine pancreatic tumor Patient is s/p pancreaticoduodenectomy and hepaticojejunostomy.  -Per general surgery  Acute blood loss anemia Post op. Required 3 units of PRBC.  Leukocytosis In setting of recent surgery, possible pneumonia. Blood cultures with no growth to date. Patient was started on Vancomycin and Cefepime empirically and transitioned to Cefepime -Continue Cefepime  Acute kidney injury Peak creatinine of 1.55. Baseline appears to be around 1. Resolved.  Hypomagnesemia Hypophosphatemia Per pharmacy while on TPN  Metabolic acidosis Resolved.  Prolonged QTC -Continue telemetry  Morbid obesity Body mass index is 44.06 kg/m.  Anxiety Patient states she has panic attacks -Continue ativan prn  Abnormal LFTs Transient. Resolved.  Right foot drop   DVT prophylaxis: Lovenox Code Status:   Code Status: Full Code Family Communication: None Disposition Plan: Discharge per primary and continued medical improvement   Procedures:   s/p diagnostic laparoscopy, classic pancreatico duodenectomy, distal pancreatectomy, placement of biliary and pancreatic duct stents by CCS on 12/15  Has 2 abdominal drains on the right and one on the left.  Right IJ central line and Foley catheter have been discontinued.  Antimicrobials:  Vancomycin  Cefepime    Subjective: Had some issues with panic attacks. No chest pain or dyspnea.  Objective: Vitals:   01/23/19 0600 01/23/19 0700 01/23/19 0800 01/23/19 0843  BP: 123/77 116/74 132/79   Pulse: (!) 125 (!) 120 (!) 128 (!) 122  Resp: 18 17 18 18   Temp:   97.6 F (36.4 C)   TempSrc:   Oral   SpO2: 100% 99% 93%   Weight:      Height:        Intake/Output Summary (Last 24 hours) at 01/23/2019 0855 Last data filed at 01/23/2019 0700 Gross per 24  hour  Intake 2590.71 ml  Output 1410 ml  Net 1180.71 ml   Filed Weights    01/15/19 0612 01/21/19 1004 01/22/19 0318  Weight: 114.3 kg 117.3 kg 120.1 kg    Examination:  General exam: Appears calm and comfortable Respiratory system: Clear to auscultation. Respiratory effort normal. Cardiovascular system: S1 & S2 heard, Regular rhythm with tachycardia. No murmurs, rubs, gallops or clicks. Gastrointestinal system: Abdomen is distended, soft and nontender. Drain sites without surrounding erythema. Decreased bowel sounds heard Central nervous system: Alert and oriented. No focal neurological deficits. Extremities: No edema. No calf tenderness Skin: No cyanosis. No rashes Psychiatry: Judgement and insight appear normal. Mood & affect appropriate.     Data Reviewed: I have personally reviewed following labs and imaging studies  CBC: Recent Labs  Lab 01/19/19 0615 01/20/19 0623 01/21/19 0250 01/21/19 0430 01/22/19 0414 01/23/19 0546  WBC 30.5* 39.7* 30.2*  --  25.1* 22.1*  NEUTROABS  --   --   --   --  18.7*  --   HGB 7.4* 8.1* 7.3* 6.8* 8.6* 8.7*  HCT 23.9* 26.1* 24.0* 20.0* 27.7* 28.2*  MCV 94.5 92.6 94.5  --  93.6 93.4  PLT 221 261 260  --  233 [id number]   Basic Metabolic Panel: Recent Labs  Lab 01/19/19 0615 01/20/19 0623 01/21/19 0250 01/21/19 0430 01/22/19 0545 01/23/19 0546  NA 139 137 138 137 143 144  K 4.0 4.3 3.7 3.3* 4.0 3.6  CL 108 106 105  --  111 112*  CO2 20* 18* 19*  --  21* 22  GLUCOSE 253* 309* 151*  --  195* 270*  BUN 24* 26* 26*  --  23* 24*  CREATININE 0.99 1.12* 1.07*  --  0.90 0.88  CALCIUM 7.5* 7.6* 7.9*  --  8.1* 8.3*  MG 2.4 2.3 2.2  --  2.3 2.1  PHOS 1.2* 1.9* 2.3*  --  2.5 1.4*   GFR: Estimated Creatinine Clearance: 97 mL/min (by C-G formula based on SCr of 0.88 mg/dL). Liver Function Tests: Recent Labs  Lab 01/19/19 0615 01/20/19 0623 01/21/19 0250 01/22/19 0545 01/23/19 0546  AST 25 27 26 18 21   ALT 42 32 24 16 17   ALKPHOS 54 82 60 51 62  BILITOT 0.5 0.5 1.0 1.3* 1.1  PROT 5.9* 6.1* 6.7 6.0* 5.8*    ALBUMIN 2.8* 2.6* 3.8 3.5 2.7*   No results for input(s): LIPASE, AMYLASE in the last 168 hours. No results for input(s): AMMONIA in the last 168 hours. Coagulation Profile: No results for input(s): INR, PROTIME in the last 168 hours. Cardiac Enzymes: No results for input(s): CKTOTAL, CKMB, CKMBINDEX, TROPONINI in the last 168 hours. BNP (last 3 results) No results for input(s): PROBNP in the last 8760 hours. HbA1C: No results for input(s): HGBA1C in the last 72 hours. CBG: Recent Labs  Lab 01/22/19 1618 01/22/19 1943 01/22/19 2313 01/23/19 0322 01/23/19 0758  GLUCAP 282* 257* 229* 260* 275*   Lipid Profile: Recent Labs    01/21/19 0250 01/22/19 0415  TRIG 256* 241*   Thyroid Function Tests: No results for input(s): TSH, T4TOTAL, FREET4, T3FREE, THYROIDAB in the last 72 hours. Anemia Panel: No results for input(s): VITAMINB12, FOLATE, FERRITIN, TIBC, IRON, RETICCTPCT in the last 72 hours. Sepsis Labs: No results for input(s): PROCALCITON, LATICACIDVEN in the last 168 hours.  Recent Results (from the past 240 hour(s))  MRSA PCR Screening     Status: None   Collection Time: 01/15/19  5:33 PM  Specimen: Nasal Mucosa; Nasopharyngeal  Result Value Ref Range Status   MRSA by PCR NEGATIVE NEGATIVE Final    Comment:        The GeneXpert MRSA Assay (FDA approved for NASAL specimens only), is one component of a comprehensive MRSA colonization surveillance program. It is not intended to diagnose MRSA infection nor to guide or monitor treatment for MRSA infections. Performed at [facility], [place] [address], [place], [place] [address]   Culture, blood (routine x 2)     Status: None (Preliminary result)   Collection Time: 01/19/19 10:42 AM   Specimen: BLOOD RIGHT HAND  Result Value Ref Range Status   Specimen Description BLOOD RIGHT HAND  Final   Special Requests   Final    BOTTLES DRAWN AEROBIC AND ANAEROBIC Blood Culture adequate volume   Culture   Final     NO GROWTH 3 DAYS Performed at [facility], [place] [address] [place], [place] [address]    Report Status PENDING  Incomplete  Culture, blood (routine x 2)     Status: None (Preliminary result)   Collection Time: 01/19/19 10:50 AM   Specimen: BLOOD  Result Value Ref Range Status   Specimen Description BLOOD BLOOD RIGHT WRIST  Final   Special Requests   Final    BOTTLES DRAWN AEROBIC AND ANAEROBIC Blood Culture adequate volume   Culture   Final    NO GROWTH 3 DAYS Performed at [facility], [place] [address], [place], [place] [address]    Report Status PENDING  Incomplete  Culture, respiratory (non-expectorated)     Status: None (Preliminary result)   Collection Time: 01/21/19 10:32 AM   Specimen: Tracheal Aspirate; Respiratory  Result Value Ref Range Status   Specimen Description TRACHEAL ASPIRATE  Final   Special Requests NONE  Final   Gram Stain RARE WBC PRESENT, PREDOMINANTLY PMN RARE YEAST   Final   Culture   Final    CULTURE REINCUBATED FOR BETTER GROWTH Performed at [facility], [place] [address] [place], [place] [address]    Report Status PENDING  Incomplete  Culture, body fluid-bottle     Status: None (Preliminary result)   Collection Time: 01/21/19 10:39 AM   Specimen: Pleura  Result Value Ref Range Status   Specimen Description PLEURAL LEFT  Final   Special Requests NONE  Final   Culture   Final    NO GROWTH 1 DAY Performed at [facility], [place] [address] [place], [place] [address]    Report Status PENDING  Incomplete  Gram stain     Status: None   Collection Time: 01/21/19 10:39 AM   Specimen: Pleura  Result Value Ref Range Status   Specimen Description PLEURAL LEFT  Final   Special Requests NONE  Final   Gram Stain   Final    FEW WBC PRESENT, PREDOMINANTLY PMN NO ORGANISMS SEEN Performed at [facility], [place] [address] [place], [place] [address]    Report Status 01/21/2019 FINAL  Final  Culture, respiratory (non-expectorated)      Status: None (Preliminary result)   Collection Time: 01/22/19  9:45 AM   Specimen: Tracheal Aspirate; Respiratory  Result Value Ref Range Status   Specimen Description TRACHEAL ASPIRATE  Final   Special Requests NONE  Final   Gram Stain   Final    NO WBC SEEN NO ORGANISMS SEEN Performed at [facility], [address], [place], [place]  [id number]    Culture PENDING  Incomplete   Report Status PENDING  Incomplete         Radiology Studies: CXR  Result Date: 01/23/2019 CLINICAL DATA:  Status post Whipple procedure. EXAM: PORTABLE CHEST 1 VIEW COMPARISON:  01/22/2019 FINDINGS: The endotracheal tube has been removed. The NG tube is stable. The right PICC line is stable. Worsening aeration status post extubation with low lung volumes and worsening bibasilar atelectasis. Probable small left effusion. IMPRESSION: 1. Removal of endotracheal tube with slight worsening aeration and increased bibasilar atelectasis. 2. Persistent small left effusion. Electronically Signed   By: Marijo Sanes M.D.   On: 01/23/2019 07:50   DG CHEST PORT 1 VIEW  Result Date: 01/22/2019 CLINICAL DATA:  Intubation.  Status post Whipple procedure. EXAM: PORTABLE CHEST 1 VIEW COMPARISON:  01/21/2019. FINDINGS: Right PICC line noted with tip over SVC. Tracheostomy tube, NG tube in stable position. Stable cardiomegaly. Diffuse left lung and right base infiltrates noted. Interim progression from prior exam. No significant pleural effusion. No pneumothorax. IMPRESSION: 1. Right PICC line noted with tip over superior vena cava. Endotracheal tube and NG tube in stable position. 2. Diffuse left lung and right base infiltrates, increased from prior exam. 3.  Stable cardiomegaly. Electronically Signed   By: Marcello Moores  Register   On: 01/22/2019 06:49   DG Chest Port 1 View  Result Date: 01/21/2019 CLINICAL DATA:  Left pleural effusion. Status post thoracentesis. EXAM: PORTABLE CHEST 1 VIEW 10:19 a.m. COMPARISON:  01/21/2019  at 3:13 a.m. FINDINGS: Endotracheal tube and NG tube appear unchanged. Significant decrease in the left pleural effusion with only a minimal effusion atelectasis remaining at the left lung base. No pneumothorax. Right lung is clear. Heart size and vascularity are normal. IMPRESSION: 1. Marked decrease in the left pleural effusion. No pneumothorax. 2. Minimal residual atelectasis at the left lung base. Electronically Signed   By: Lorriane Shire M.D.   On: 01/21/2019 10:35   DG Abd Portable 1V  Result Date: 01/22/2019 CLINICAL DATA:  Enteric tube placement EXAM: PORTABLE ABDOMEN - 1 VIEW COMPARISON:  01/20/2019 CT abdomen/pelvis FINDINGS: Enteric tube terminates in proximal stomach with side port in the gastric cardia region. Skin staples overlie the upper abdomen bilaterally. Surgical drains terminate over the medial left and upper right abdomen. No evidence of pneumatosis or pneumoperitoneum. IMPRESSION: Enteric tube terminates in the proximal stomach. Electronically Signed   By: Ilona Sorrel M.D.   On: 01/22/2019 14:33   [place] EKG SITE RITE  Result Date: 01/21/2019 If Site Rite image not attached, placement could not be confirmed due to current cardiac rhythm.  IR THORACENTESIS ASP PLEURAL SPACE W/IMG GUIDE  Result Date: 01/21/2019 INDICATION: Respiratory failure, left-sided pleural effusion. Request diagnostic and therapeutic thoracentesis. EXAM: ULTRASOUND GUIDED LEFT THORACENTESIS MEDICATIONS: None. COMPLICATIONS: None immediate. Postprocedural chest x-ray negative for pneumothorax. PROCEDURE: An ultrasound guided thoracentesis was thoroughly discussed with the patient and questions answered. The benefits, risks, alternatives and complications were also discussed. The patient understands and wishes to proceed with the procedure. Written consent was obtained. Ultrasound was performed to localize and mark an adequate pocket of fluid in the left chest. The area was then prepped and draped in the normal  sterile fashion. 1% Lidocaine was used for local anesthesia. Under ultrasound guidance a 6 Fr Safe-T-Centesis catheter was introduced. Thoracentesis was performed. The catheter was removed and a dressing applied. FINDINGS: A total of approximately 300 mL of clear yellow fluid was removed. Samples were sent to the laboratory  as requested by the clinical team. IMPRESSION: Successful ultrasound guided left thoracentesis yielding 300 mL of pleural fluid. Read by: Ascencion Dike PA-C Electronically Signed   By: Jacqulynn Cadet M.D.   On: 01/21/2019 10:39        Scheduled Meds: . sodium chloride   Intravenous Once  . Chlorhexidine Gluconate Cloth  6 each Topical Daily  . enoxaparin (LOVENOX) injection  40 mg Subcutaneous Q24H  . insulin aspart  0-20 Units Subcutaneous Q4H  . insulin glargine  20 Units Subcutaneous BID  . ipratropium  0.5 mg Nebulization Q6H  . levalbuterol  0.63 mg Nebulization Q6H  . metoprolol tartrate  25 mg Oral BID   Continuous Infusions: . sodium chloride 50 mL/hr at 01/23/19 0300  . ceFEPime (MAXIPIME) IV Stopped (01/23/19 [id number])  . ropivacaine (PF) 2 mg/mL (0.2%) Stopped (01/19/19 1052)  . TPN ADULT (ION) 60 mL/hr at 01/23/19 0700     LOS: 8 days     Cordelia Poche, MD [facility] 01/23/2019, 8:55 AM  If 7PM-7AM, please contact night-coverage [email or web address]

## 2019-01-23 NOTE — Progress Notes (Signed)
8 Days Post-Op   Subjective/Chief Complaint: Patient is extubated Awake, alert  CXR - worsening aeration, atelectasis   Objective: Vital signs in last 24 hours: Temp:  [97.6 F (36.4 C)-99.7 F (37.6 C)] 97.6 F (36.4 C) (12/23 0800) Pulse Rate:  [109-128] 124 (12/23 1000) Resp:  [15-23] 18 (12/23 1000) BP: (105-149)/(60-87) 123/70 (12/23 1000) SpO2:  [93 %-100 %] 95 % (12/23 1000) FiO2 (%):  [32 %] 32 % (12/22 1302) Last BM Date: 01/14/19  Intake/Output from previous day: 12/22 0701 - 12/23 0700 In: 2756.2 [I.V.:2199.6; IV Piggyback:556.6] Out: 1410 [Urine:750; Emesis/NG output:500; Drains:160] Intake/Output this shift: Total I/O In: 328.2 [I.V.:328.2] Out: -   General appearance: alert, cooperative and no distress Resp: bibasilar crackles GI: soft, incisional tenderness; minimal bowel sounds Incisions c/d/i; drains with serous output  Lab Results:  Recent Labs    01/22/19 0414 01/23/19 0546  WBC 25.1* 22.1*  HGB 8.6* 8.7*  HCT 27.7* 28.2*  PLT 233 265   BMET Recent Labs    01/22/19 0545 01/23/19 0546  NA 143 144  K 4.0 3.6  CL 111 112*  CO2 21* 22  GLUCOSE 195* 270*  BUN 23* 24*  CREATININE 0.90 0.88  CALCIUM 8.1* 8.3*   PT/INR No results for input(s): LABPROT, INR in the last 72 hours. ABG Recent Labs    01/21/19 0430  PHART 7.432  HCO3 21.0    Studies/Results: CXR  Result Date: 01/23/2019 CLINICAL DATA:  Status post Whipple procedure. EXAM: PORTABLE CHEST 1 VIEW COMPARISON:  01/22/2019 FINDINGS: The endotracheal tube has been removed. The NG tube is stable. The right PICC line is stable. Worsening aeration status post extubation with low lung volumes and worsening bibasilar atelectasis. Probable small left effusion. IMPRESSION: 1. Removal of endotracheal tube with slight worsening aeration and increased bibasilar atelectasis. 2. Persistent small left effusion. Electronically Signed   By: Marijo Sanes M.D.   On: 01/23/2019 07:50   DG  CHEST PORT 1 VIEW  Result Date: 01/22/2019 CLINICAL DATA:  Intubation.  Status post Whipple procedure. EXAM: PORTABLE CHEST 1 VIEW COMPARISON:  01/21/2019. FINDINGS: Right PICC line noted with tip over SVC. Tracheostomy tube, NG tube in stable position. Stable cardiomegaly. Diffuse left lung and right base infiltrates noted. Interim progression from prior exam. No significant pleural effusion. No pneumothorax. IMPRESSION: 1. Right PICC line noted with tip over superior vena cava. Endotracheal tube and NG tube in stable position. 2. Diffuse left lung and right base infiltrates, increased from prior exam. 3.  Stable cardiomegaly. Electronically Signed   By: Marcello Moores  Register   On: 01/22/2019 06:49   DG Abd Portable 1V  Result Date: 01/22/2019 CLINICAL DATA:  Enteric tube placement EXAM: PORTABLE ABDOMEN - 1 VIEW COMPARISON:  01/20/2019 CT abdomen/pelvis FINDINGS: Enteric tube terminates in proximal stomach with side port in the gastric cardia region. Skin staples overlie the upper abdomen bilaterally. Surgical drains terminate over the medial left and upper right abdomen. No evidence of pneumatosis or pneumoperitoneum. IMPRESSION: Enteric tube terminates in the proximal stomach. Electronically Signed   By: Ilona Sorrel M.D.   On: 01/22/2019 14:33    Anti-infectives: Anti-infectives (From admission, onward)   Start     Dose/Rate Route Frequency Ordered Stop   01/19/19 1430  vancomycin (VANCOREADY) IVPB 1500 mg/300 mL  Status:  Discontinued     1,500 mg 150 mL/hr over 120 Minutes Intravenous Every 24 hours 01/19/19 1351 01/22/19 0952   01/19/19 1400  ceFEPIme (MAXIPIME) 2 g in sodium chloride  0.9 % 100 mL IVPB     2 g 200 mL/hr over 30 Minutes Intravenous Every 8 hours 01/19/19 1349     01/15/19 2000  ceFAZolin (ANCEF) IVPB 2g/100 mL premix     2 g 200 mL/hr over 30 Minutes Intravenous Every 8 hours 01/15/19 1712 01/15/19 2052   01/15/19 0600  ceFAZolin (ANCEF) IVPB 2g/100 mL premix     2 g 200  mL/hr over 30 Minutes Intravenous On call to O.R. 01/15/19 0549 01/15/19 1145      Assessment/Plan: s/p Procedure(s): WHIPPLE PROCEDURE (N/A) LAPAROSCOPY DIAGNOSTIC (N/A) DISTAL PANCREATECTOMY (N/A)  S/p Whipple - 12/15 Dr. Barry Dienes. Drains with high amylase x2, but will leave all three in place (20,10,160cc o/p) ABL anemia -Hgb stable DM - 71u total in the last 24h, on resistant SSI and glargine but still hyperglycemic. Increase glargine to 20u BID and SSI to remain. TRH to follow. Leukocytosis/ID -afebrile, WBC down to 22 today. CXR suspicious for pneumonia and bibasilar atelectasis. Vanc/cefepime started 12/20. IR thoracentesis 12/21, cx NGTD, but pleural fluid c/w exudative effusion.Blood cx pending, NGTD. Reintubated 12/20, resp cx 12/21 re-incubated.CT C/A/P 12/20 with peri-pancreatic 3.6x7.6x4.6cm fluid collection is not formed and no gas present.  Vent dependent respiratory failure - extubated yesterday. F/E/N - ileus; on TPN, NGT to remain--700cc bilious o/p x24h, replete phos Acidosis - likely related to blood loss/underresuscitation. CO2 nearly normal.  AKI - resolved Elevated LFTs - resolved  Transfer to [facility] today   LOS: 8 days    Maia Petties 01/23/2019

## 2019-01-23 NOTE — Progress Notes (Signed)
PHARMACY - TOTAL PARENTERAL NUTRITION CONSULT NOTE  Indication: Prolonged ileus  Patient Measurements: Height: 5\' 5"  (165.1 cm) Weight: 264 lb 12.4 oz (120.1 kg) IBW/kg (Calculated) : 57 TPN AdjBW (KG): 69.6 Body mass index is 44.06 kg/m. Usual Weight: ~113.6 kg  Assessment:  32 yof with a neuroendocrine tumor of the pancreas admitted for surgery and is now post-op Whipple procedure and distal pancreatectomy on 12/15. Pathology is pending. She was started on clear liquids 12/18 with fair appetite and advanced to regular diet 12/20 however had respiratory distress and hypoxia overnight 12/20 requiring re-intubation. CT abd/pelvis shows likely SB ileus.  Glucose / Insulin: hx DM with A1c 7.5%, on Janumet PTA.  CBGs 200s Received 47 units SSI + Lantus 20 units BID + 10 units regular insulin in TPN Electrolytes: K 3.6 (goal >= 4 for ileus), CL slightly elevated, Phos low 1.4, others WNL Renal: SCr 0.88, BUN 24 LFTs / TGs: LFTs / tbili WNL, TG 256>241 Prealbumin / albumin: albumin 2.7 Intake / Output; MIVF: drain O/P 141mL, NGT O/P 5110mL, UOP 0.3 ml/kg/hr, net +10.2L GI Imaging: 12/20 CT abd/pelvis - distended gas filled loops of colon and SB likely due to ileus, small amount pelvic free fluid, probable mild hepatic steatosis Surgeries / Procedures:  12/15 Diagnostic laparoscopy, classic pancreaticoduodenectomy, distal pancreatectomy, placement of biliary and pancreatic duct stent  12/21 IR thoracentesis yielding 300 ml clear yellow fluid  Central access: double lumen PICC placed 01/21/19 TPN start date: 01/21/19  Nutritional Goals (per RD rec on 12/22): 2100-2300 kCal, 110-125g protein, ~2L fluid per day  Current Nutrition:  NPO and TPN  Plan:  Adjust TPN with re-estimated needs  TPN at 40 ml/hr (goal rate 90 ml/hr) to maintain CHO content while adjusting insulin TPN will provide 53g AA, 130g CHO and 29g ILE for a total of 942 kCal, meeting ~44% of patient needs Electrolytes in  TPN: reduce Na slightly, increase K and Phos, max acetate  Add standard MVI on MWF and trace elements daily to TPN Continue resistant SSI Q4H + Lantus 20 units BID per MD + increase regular insulin in TPN to 30 units (Pharmacy to adjust insulin in TPN per TRH) KPhos 30 mmol IV x 1 F/U AM labs, volume status and the need to concentrate TPN, CBGs to advance TPN, watch TG  Leah Farmer, PharmD, BCPS, [place] 01/23/2019, 9:35 AM

## 2019-01-23 NOTE — Progress Notes (Signed)
PT OOB SITTING UP IN CHAIR FOLLOWING TRANSFER FROM ICU, BED NOT WORKING. ON TRANSFER TO STANDING AND TO BED, NGT DISLODGED FROM NARE. NO OBVIOUS DISTRESS NOTED. DR TSUEI NOTIFIED OF EVENT. NO NEW ORDERS.

## 2019-01-23 NOTE — Progress Notes (Signed)
PCCM Interval Note  Patient extubated. TRH now primary team. PCCM signing off. Please call for any concerns or questions.  Rodman Pickle, M.D. [facility] Pulmonary/Critical Care Medicine 01/23/2019 5:08 PM

## 2019-01-23 NOTE — Progress Notes (Signed)
Physical Therapy Treatment Patient Details Name: Leah Farmer MRN: [id number] DOB: [birth year] Today's Date: 01/23/2019    History of Present Illness pt is a 53 y/o female with pmh significant for HTN, arthritis, DM admitted with primary malignant pancreatic tumors x2, s/p whipple procedure.    PT Comments    Pt rather lethargic today, but participated well once mobile.  Emphasis on transitions to EOB, sit to stand and progressing gait.  Pt's R ankle which lacks df and eversion was able to withstand w/bearing today without turning over.    Follow Up Recommendations  CIR     Equipment Recommendations  Rolling walker with 5" wheels    Recommendations for Other Services       Precautions / Restrictions Precautions Precautions: Fall Precaution Comments: 3 JP drains Restrictions Weight Bearing Restrictions: No    Mobility  Bed Mobility Overal bed mobility: Needs Assistance Bed Mobility: Rolling;Sidelying to Sit Rolling: Mod assist Sidelying to sit: Mod assist;+2 for physical assistance       General bed mobility comments: cues for sequencing, minimal to moderate assist for legs and more assist to come up from L elbow to sit.  Transfers Overall transfer level: Needs assistance   Transfers: Sit to/from Stand Sit to Stand: Min assist;+2 safety/equipment         General transfer comment: cues for hand placement.  R ankle held positioning well  Ambulation/Gait Ambulation/Gait assistance: Min assist;Mod assist Gait Distance (Feet): 25 Feet Assistive device: Rolling walker (2 wheeled) Gait Pattern/deviations: Step-to pattern Gait velocity: slow Gait velocity interpretation: <1.31 ft/sec, indicative of household ambulator General Gait Details: truncal support for advancing R LE with a steppage pattern due to no df.  Pt's ankle is now stable enough to support w/bearing while pt advances L LE.   Stairs             Wheelchair Mobility    Modified Rankin  (Stroke Patients Only)       Balance Overall balance assessment: Needs assistance Sitting-balance support: Feet supported Sitting balance-[name]: Fair     Standing balance support: Bilateral upper extremity supported Standing balance-[name]: Poor Standing balance comment: continued work on w/shifting, stepping and maneuvering the RW                            Cognition Arousal/Alertness: Awake/alert;Lethargic Behavior During Therapy: WFL for tasks assessed/performed Overall Cognitive Status: Within Functional Limits for tasks assessed                                        Exercises      General Comments General comments (skin integrity, edema, etc.): vss.  pt was noticeably fatigued after a relatively short session.      Pertinent Vitals/Pain Pain Assessment: Faces Faces Pain Scale: Hurts little more Pain Location: abdominal incisions Pain Descriptors / Indicators: Discomfort;Grimacing;Guarding Pain Intervention(s): Monitored during session    Home Living                      Prior Function            PT Goals (current goals can now be found in the care plan section) Acute Rehab PT Goals Patient Stated Goal: able to get home and into my chair for Christmas. PT Goal Formulation: With patient Time For Goal Achievement: 01/31/19 Potential to Achieve  Goals: Good Progress towards PT goals: Progressing toward goals    Frequency    Min 3X/week      PT Plan Current plan remains appropriate    Co-evaluation              AM-PAC PT "6 Clicks" Mobility   Outcome Measure  Help needed turning from your back to your side while in a flat bed without using bedrails?: A Lot Help needed moving from lying on your back to sitting on the side of a flat bed without using bedrails?: A Lot Help needed moving to and from a bed to a chair (including a wheelchair)?: A Little Help needed standing up from a chair using your  arms (e.g., wheelchair or bedside chair)?: A Little Help needed to walk in hospital room?: A Lot Help needed climbing 3-5 steps with a railing? : Total 6 Click Score: 13    End of Session   Activity Tolerance: Patient tolerated treatment well;Patient limited by fatigue Patient left: in chair;with call bell/phone within reach;with chair alarm set Nurse Communication: Mobility status PT Visit Diagnosis: Other abnormalities of gait and mobility (R26.89);Muscle weakness (generalized) (M62.81);Pain Pain - Right/Left: (abdomen)     Time: [id number] PT Time Calculation (min) (ACUTE ONLY): 29 min  Charges:  $Gait Training: 8-22 mins $Therapeutic Activity: 8-22 mins                     01/23/2019  Ginger Carne., PT Acute Rehabilitation Services [phone]  (pager) [phone]  (office)   Tessie Fass Lynnley Doddridge 01/23/2019, 5:09 PM

## 2019-01-24 ENCOUNTER — Inpatient Hospital Stay (HOSPITAL_COMMUNITY): Payer: BC Managed Care – PPO

## 2019-01-24 LAB — CBC
HCT: 27.3 % — ABNORMAL LOW (ref 36.0–46.0)
Hemoglobin: 8.8 g/dL — ABNORMAL LOW (ref 12.0–15.0)
MCH: 29.3 pg (ref 26.0–34.0)
MCHC: 32.2 g/dL (ref 30.0–36.0)
MCV: 91 fL (ref 80.0–100.0)
Platelets: 305 10*3/uL (ref 150–400)
RBC: 3 MIL/uL — ABNORMAL LOW (ref 3.87–5.11)
RDW: 16.9 % — ABNORMAL HIGH (ref 11.5–15.5)
WBC: 20.7 10*3/uL — ABNORMAL HIGH (ref 4.0–10.5)
nRBC: 1.6 % — ABNORMAL HIGH (ref 0.0–0.2)

## 2019-01-24 LAB — GLUCOSE, CAPILLARY
Glucose-Capillary: 171 mg/dL — ABNORMAL HIGH (ref 70–99)
Glucose-Capillary: 178 mg/dL — ABNORMAL HIGH (ref 70–99)
Glucose-Capillary: 210 mg/dL — ABNORMAL HIGH (ref 70–99)
Glucose-Capillary: 217 mg/dL — ABNORMAL HIGH (ref 70–99)
Glucose-Capillary: 222 mg/dL — ABNORMAL HIGH (ref 70–99)
Glucose-Capillary: 223 mg/dL — ABNORMAL HIGH (ref 70–99)

## 2019-01-24 LAB — COMPREHENSIVE METABOLIC PANEL
ALT: 18 U/L (ref 0–44)
AST: 27 U/L (ref 15–41)
Albumin: 2.5 g/dL — ABNORMAL LOW (ref 3.5–5.0)
Alkaline Phosphatase: 61 U/L (ref 38–126)
Anion gap: 10 (ref 5–15)
BUN: 23 mg/dL — ABNORMAL HIGH (ref 6–20)
CO2: 21 mmol/L — ABNORMAL LOW (ref 22–32)
Calcium: 8.2 mg/dL — ABNORMAL LOW (ref 8.9–10.3)
Chloride: 114 mmol/L — ABNORMAL HIGH (ref 98–111)
Creatinine, Ser: 0.92 mg/dL (ref 0.44–1.00)
GFR calc Af Amer: >60 mL/min (ref >60)
GFR calc non Af Amer: >60 mL/min (ref >60)
Glucose, Bld: 219 mg/dL — ABNORMAL HIGH (ref 70–99)
Potassium: 3.7 mmol/L (ref 3.5–5.1)
Sodium: 145 mmol/L (ref 135–145)
Total Bilirubin: 0.9 mg/dL (ref 0.3–1.2)
Total Protein: 5.8 g/dL — ABNORMAL LOW (ref 6.5–8.1)

## 2019-01-24 LAB — CULTURE, RESPIRATORY W GRAM STAIN
Culture: NORMAL
Gram Stain: NONE SEEN

## 2019-01-24 LAB — PHOSPHORUS: Phosphorus: 1.8 mg/dL — ABNORMAL LOW (ref 2.5–4.6)

## 2019-01-24 LAB — CULTURE, BLOOD (ROUTINE X 2)
Culture: NO GROWTH
Culture: NO GROWTH
Special Requests: ADEQUATE
Special Requests: ADEQUATE

## 2019-01-24 LAB — MAGNESIUM: Magnesium: 2.1 mg/dL (ref 1.7–2.4)

## 2019-01-24 MED ORDER — POTASSIUM PHOSPHATES 15 MMOLE/5ML IV SOLN
20.0000 mmol | Freq: Once | INTRAVENOUS | Status: AC
  Administered 2019-01-24: 10:00:00 20 mmol via INTRAVENOUS
  Filled 2019-01-24: qty 6.67

## 2019-01-24 MED ORDER — POTASSIUM PHOSPHATES 15 MMOLE/5ML IV SOLN: 30.0000 mmol | Freq: Once | INTRAVENOUS | Status: DC

## 2019-01-24 MED ORDER — TRAVASOL 10 % IV SOLN
INTRAVENOUS | Status: AC
  Filled 2019-01-24: qty 799.2

## 2019-01-24 NOTE — Progress Notes (Signed)
PROGRESS NOTE    Leah Farmer  [id number] DOB: 26-Sep-1965 DOA: 01/15/2019 PCP: Margy Clarks, NP   Brief Narrative: Leah Farmer is a 53 y.o. female, PMH of type II DM, HTN, GERD, anxiety and depression, NAFL, primary malignant neuroendocrine tumor of the pancreas x2, s/p diagnostic laparoscopy, classic pancreatico duodenectomy, distal pancreatectomy, placement of biliary and pancreatic duct stents by [facility] on 12/15, [facility] was consulted on 12/16 to assist with management of uncontrolled DM and pneumonia.  Postop course complicated by postop ileus, sinus tachycardia, expected acute blood loss anemia and electrolyte abnormalities in addition to respiratory failure requiring short term intubation.    Assessment & Plan:   Principal Problem:   Primary malignant neuroendocrine neoplasm of pancreas [facility]) Active Problems:   Pancreatic tumor   Diabetes mellitus type 2 in obese ([place])   HCAP (healthcare-associated pneumonia)   Essential hypertension   Depression   Pleural effusion, left   Acute respiratory failure with hypoxia (HCC)   OSA (obstructive sleep apnea)   Diabetes mellitus, type 2, uncontrolled with hyperglycemia Patient's A1C is 7.5% from 12/11. On Janumet XR as an outpatient. No worsened in setting of pancreatectomy and TPN. On Lantus 20 units BID, SSI and insulin in TPN -Continue Lantus and SSI -Pharmacy to adjust inulin in TPN  Essential hypertension -Continue metoprolol  Wheezing No history of reactive airway/asthma/COPD. -Continue Xopenex -Repeat Chest x-ray  Sinus tachycardia Persistent. Patient is on metoprolol. Likely multifactorial.  -Continue telemetry for now  Possible pneumonia Chest x-ray concerning for possible pneumonia vs atelectasis. Associated leukocytosis in setting of recent surgery in addition to acute respiratory failure requiring intubation. Now extubated. -Continue Cefepime -Continue incentive spirometer  Acute respiratory failure  with hypoxia Likely secondary to possible Pneumonia/atelectasis/effusions. Required intubation briefly from 12/21 to 12/22. Now on room air. Concern for possible OSA now using CPAP -Continue CPAP qhs  Primary malignant neuroendocrine pancreatic tumor Patient is s/p pancreaticoduodenectomy and hepaticojejunostomy.  -Per general surgery  Acute blood loss anemia Post op. Required 3 units of PRBC.  Leukocytosis In setting of recent surgery, possible pneumonia. Blood cultures with no growth to date. Patient was started on Vancomycin and Cefepime empirically and transitioned to Cefepime -Continue Cefepime  Acute kidney injury Peak creatinine of 1.55. Baseline appears to be around 1. Resolved.  Hypomagnesemia Hypophosphatemia Per pharmacy while on TPN  Metabolic acidosis Resolved.  Prolonged QTc Last EKG with normal QTc  Morbid obesity Body mass index is 44.06 kg/m.  Anxiety Patient states she has panic attacks -Continue ativan prn  Abnormal LFTs Transient. Resolved.  Right foot drop   DVT prophylaxis: Lovenox Code Status:   Code Status: Full Code Family Communication: None Disposition Plan: Discharge per primary and continued medical improvement   Procedures:   s/p diagnostic laparoscopy, classic pancreatico duodenectomy, distal pancreatectomy, placement of biliary and pancreatic duct stents by [facility] on 12/15  Has 2 abdominal drains on the right and one on the left.  Right IJ central line and Foley catheter have been discontinued.  Antimicrobials:  Vancomycin  Cefepime    Subjective: Still talks about anxiety. Cough.  Objective: Vitals:   01/24/19 0000 01/24/19 0337 01/24/19 0815 01/24/19 0847  BP: (!) 150/113  139/79   Pulse: (!) 117 (!) 108 (!) 115 (!) 110  Resp: 19 18 18 16   Temp:  98.1 F (36.7 C) 97.7 F (36.5 C)   TempSrc:  Oral Oral   SpO2: 96% 96% 93%   Weight:      Height:  Intake/Output Summary (Last 24 hours) at 01/24/2019  1108 Last data filed at 01/24/2019 0649 Gross per 24 hour  Intake 1400.45 ml  Output 2475 ml  Net -1074.55 ml   Filed Weights   01/15/19 0612 01/21/19 1004 01/22/19 0318  Weight: 114.3 kg 117.3 kg 120.1 kg    Examination:  General exam: Appears calm and comfortable Respiratory system: Mild wheezing. Respiratory effort normal. Cardiovascular system: S1 & S2 heard, RRR. No murmurs, rubs, gallops or clicks. Gastrointestinal system: Abdomen is nondistended, soft and nontender. No organomegaly or masses felt. Normal bowel sounds heard. Central nervous system: Alert and oriented. No focal neurological deficits. Extremities: No edema. No calf tenderness Skin: No cyanosis. No rashes Psychiatry: Judgement and insight appear normal. Mood & affect appropriate.     Data Reviewed: I have personally reviewed following labs and imaging studies  CBC: Recent Labs  Lab 01/20/19 0623 01/21/19 0250 01/21/19 0430 01/22/19 0414 01/23/19 0546 01/24/19 0200  WBC 39.7* 30.2*  --  25.1* 22.1* 20.7*  NEUTROABS  --   --   --  18.7*  --   --   HGB 8.1* 7.3* 6.8* 8.6* 8.7* 8.8*  HCT 26.1* 24.0* 20.0* 27.7* 28.2* 27.3*  MCV 92.6 94.5  --  93.6 93.4 91.0  PLT 261 260  --  233 265 [id number]   Basic Metabolic Panel: Recent Labs  Lab 01/20/19 0623 01/21/19 0250 01/21/19 0430 01/22/19 0545 01/23/19 0546 01/24/19 0200  NA 137 138 137 143 144 145  K 4.3 3.7 3.3* 4.0 3.6 3.7  CL 106 105  --  111 112* 114*  CO2 18* 19*  --  21* 22 21*  GLUCOSE 309* 151*  --  195* 270* 219*  BUN 26* 26*  --  23* 24* 23*  CREATININE 1.12* 1.07*  --  0.90 0.88 0.92  CALCIUM 7.6* 7.9*  --  8.1* 8.3* 8.2*  MG 2.3 2.2  --  2.3 2.1 2.1  PHOS 1.9* 2.3*  --  2.5 1.4* 1.8*   GFR: Estimated Creatinine Clearance: 92.8 mL/min (by C-G formula based on SCr of 0.92 mg/dL). Liver Function Tests: Recent Labs  Lab 01/20/19 0623 01/21/19 0250 01/22/19 0545 01/23/19 0546 01/24/19 0200  AST 27 26 18 21 27   ALT 32 24 16 17 18    ALKPHOS 82 60 51 62 61  BILITOT 0.5 1.0 1.3* 1.1 0.9  PROT 6.1* 6.7 6.0* 5.8* 5.8*  ALBUMIN 2.6* 3.8 3.5 2.7* 2.5*   No results for input(s): LIPASE, AMYLASE in the last 168 hours. No results for input(s): AMMONIA in the last 168 hours. Coagulation Profile: No results for input(s): INR, PROTIME in the last 168 hours. Cardiac Enzymes: No results for input(s): CKTOTAL, CKMB, CKMBINDEX, TROPONINI in the last 168 hours. BNP (last 3 results) No results for input(s): PROBNP in the last 8760 hours. HbA1C: No results for input(s): HGBA1C in the last 72 hours. CBG: Recent Labs  Lab 01/23/19 1604 01/23/19 2027 01/23/19 2344 01/24/19 0335 01/24/19 0817  GLUCAP 305* 236* 198* 210* 217*   Lipid Profile: Recent Labs    01/22/19 0415  TRIG 241*   Thyroid Function Tests: No results for input(s): TSH, T4TOTAL, FREET4, T3FREE, THYROIDAB in the last 72 hours. Anemia Panel: No results for input(s): VITAMINB12, FOLATE, FERRITIN, TIBC, IRON, RETICCTPCT in the last 72 hours. Sepsis Labs: No results for input(s): PROCALCITON, LATICACIDVEN in the last 168 hours.  Recent Results (from the past 240 hour(s))  MRSA PCR Screening     Status: None  Collection Time: 01/15/19  5:33 PM   Specimen: Nasal Mucosa; Nasopharyngeal  Result Value Ref Range Status   MRSA by PCR NEGATIVE NEGATIVE Final    Comment:        The GeneXpert MRSA Assay (FDA approved for NASAL specimens only), is one component of a comprehensive MRSA colonization surveillance program. It is not intended to diagnose MRSA infection nor to guide or monitor treatment for MRSA infections. Performed at [facility], [place] [address] [place], [place] [address]   Culture, blood (routine x 2)     Status: None   Collection Time: 01/19/19 10:42 AM   Specimen: BLOOD RIGHT HAND  Result Value Ref Range Status   Specimen Description BLOOD RIGHT HAND  Final   Special Requests   Final    BOTTLES DRAWN AEROBIC AND ANAEROBIC Blood  Culture adequate volume   Culture   Final    NO GROWTH 5 DAYS Performed at [facility], [place] [address], [place], [place] [address]    Report Status 01/24/2019 FINAL  Final  Culture, blood (routine x 2)     Status: None   Collection Time: 01/19/19 10:50 AM   Specimen: BLOOD  Result Value Ref Range Status   Specimen Description BLOOD BLOOD RIGHT WRIST  Final   Special Requests   Final    BOTTLES DRAWN AEROBIC AND ANAEROBIC Blood Culture adequate volume   Culture   Final    NO GROWTH 5 DAYS Performed at [facility], [place] [address], [place], [place] [address]    Report Status 01/24/2019 FINAL  Final  Culture, respiratory (non-expectorated)     Status: None   Collection Time: 01/21/19 10:32 AM   Specimen: Tracheal Aspirate; Respiratory  Result Value Ref Range Status   Specimen Description TRACHEAL ASPIRATE  Final   Special Requests NONE  Final   Gram Stain   Final    RARE WBC PRESENT, PREDOMINANTLY PMN RARE YEAST Performed at [facility], [place] [address] [place], [place] [address]    Culture FEW CANDIDA LUSITANIAE  Final   Report Status 01/24/2019 FINAL  Final  Culture, body fluid-bottle     Status: None (Preliminary result)   Collection Time: 01/21/19 10:39 AM   Specimen: Pleura  Result Value Ref Range Status   Specimen Description PLEURAL LEFT  Final   Special Requests NONE  Final   Culture   Final    NO GROWTH 3 DAYS Performed at [facility], [address] [place], [place] [address]    Report Status PENDING  Incomplete  Gram stain     Status: None   Collection Time: 01/21/19 10:39 AM   Specimen: Pleura  Result Value Ref Range Status   Specimen Description PLEURAL LEFT  Final   Special Requests NONE  Final   Gram Stain   Final    FEW WBC PRESENT, PREDOMINANTLY PMN NO ORGANISMS SEEN Performed at [facility], [place] [address], [place],  [address]    Report Status 01/21/2019 FINAL  Final  Culture, respiratory (non-expectorated)      Status: None   Collection Time: 01/22/19  9:45 AM   Specimen: Tracheal Aspirate; Respiratory  Result Value Ref Range Status   Specimen Description TRACHEAL ASPIRATE  Final   Special Requests NONE  Final   Gram Stain NO WBC SEEN NO ORGANISMS SEEN   Final   Culture   Final    Consistent with normal respiratory flora. Performed  at [facility], [place] [address], [place], [place] [address]    Report Status 01/24/2019 FINAL  Final         Radiology Studies: CXR  Result Date: 01/23/2019 CLINICAL DATA:  Status post Whipple procedure. EXAM: PORTABLE CHEST 1 VIEW COMPARISON:  01/22/2019 FINDINGS: The endotracheal tube has been removed. The NG tube is stable. The right PICC line is stable. Worsening aeration status post extubation with low lung volumes and worsening bibasilar atelectasis. Probable small left effusion. IMPRESSION: 1. Removal of endotracheal tube with slight worsening aeration and increased bibasilar atelectasis. 2. Persistent small left effusion. Electronically Signed   By: Marijo Sanes M.D.   On: 01/23/2019 07:50   DG Abd Portable 1V  Result Date: 01/22/2019 CLINICAL DATA:  Enteric tube placement EXAM: PORTABLE ABDOMEN - 1 VIEW COMPARISON:  01/20/2019 CT abdomen/pelvis FINDINGS: Enteric tube terminates in proximal stomach with side port in the gastric cardia region. Skin staples overlie the upper abdomen bilaterally. Surgical drains terminate over the medial left and upper right abdomen. No evidence of pneumatosis or pneumoperitoneum. IMPRESSION: Enteric tube terminates in the proximal stomach. Electronically Signed   By: Ilona Sorrel M.D.   On: 01/22/2019 14:33        Scheduled Meds: . sodium chloride   Intravenous Once  . Chlorhexidine Gluconate Cloth  6 each Topical Daily  . enoxaparin (LOVENOX) injection  40 mg Subcutaneous Q24H  . insulin aspart  0-20 Units Subcutaneous Q4H  . insulin glargine  20 Units Subcutaneous BID  . ipratropium  0.5 mg  Nebulization Q6H  . levalbuterol  0.63 mg Nebulization Q6H  . metoprolol tartrate  25 mg Per Tube BID   Continuous Infusions: . sodium chloride 50 mL/hr at 01/23/19 1200  . ceFEPime (MAXIPIME) IV 2 g (01/24/19 0630)  . potassium PHOSPHATE IVPB (in mmol) 20 mmol (01/24/19 0951)  . TPN ADULT (ION) 40 mL/hr at 01/23/19 1722  . TPN ADULT (ION)       LOS: 9 days     Cordelia Poche, MD [facility] 01/24/2019, 11:08 AM  If 7PM-7AM, please contact night-coverage [email or web address]

## 2019-01-24 NOTE — Progress Notes (Addendum)
PHARMACY - TOTAL PARENTERAL NUTRITION CONSULT NOTE  Indication: Prolonged ileus  Patient Measurements: Height: 5\' 5"  (165.1 cm) Weight: 264 lb 12.4 oz (120.1 kg) IBW/kg (Calculated) : 57 TPN AdjBW (KG): 69.6 Body mass index is 44.06 kg/m. Usual Weight: ~113.6 kg  Assessment:  53 yof with a neuroendocrine tumor of the pancreas admitted for surgery and is now post-op Whipple procedure and distal pancreatectomy on 12/15. Pathology is pending. She was started on clear liquids 12/18 with fair appetite and advanced to regular diet 12/20 however had respiratory distress and hypoxia overnight 12/20 requiring re-intubation (extubated 12/22). CT abd/pelvis shows likely SB ileus.  Glucose / Insulin: hx DM with A1c 7.5%, on Janumet PTA.  CBGs 200s Received 44 units SSI + Lantus 20 units BID + 30 units regular insulin in TPN Electrolytes: K 3.7 (goal >= 4 for ileus), CL slightly elevated, Phos low 1.8, others WNL Renal: SCr 0.92, BUN 23 LFTs / TGs: LFTs / tbili WNL, TG 256>241 Prealbumin / albumin: albumin 2.7 Intake / Output; MIVF: drain O/P 459mL, NGT O/P 61mL, UOP 0.7 ml/kg/hr, net +2.7L GI Imaging: 12/20 CT abd/pelvis - distended gas filled loops of colon and SB likely due to ileus, small amount pelvic free fluid, probable mild hepatic steatosis Surgeries / Procedures:  12/15 Diagnostic laparoscopy, classic pancreaticoduodenectomy, distal pancreatectomy, placement of biliary and pancreatic duct stent  12/21 IR thoracentesis yielding 300 ml clear yellow fluid  Central access: double lumen PICC placed 01/21/19 TPN start date: 01/21/19  Nutritional Goals (per RD rec on 12/22): 2100-2300 kCal, 110-125g protein, ~2L fluid per day  Current Nutrition:  NPO and TPN  Plan:  Increase TPN cautiously (due to hyperglycemia) to 60 ml/hr (goal rate 90 ml/hr) TPN will provide 80g AA, 194g CHO and 43g ILE for a total of 1413kCal, meeting ~70% of patient needs Electrolytes in TPN: reduce Na slightly,  increase Phos (potassium will increase with increasing rate), max acetate  Add standard MVI on MWF and trace elements daily to TPN Continue resistant SSI Q4H + Lantus 20 units BID per MD + increase regular insulin in TPN to 50 units (Pharmacy to adjust insulin in TPN per TRH) KPhos 20 mmol IV x 1 F/U AM labs, volume status and the need to concentrate TPN, CBGs to advance TPN, watch TG  Salome Arnt, PharmD, BCPS Clinical Pharmacist Please see AMION for all pharmacy numbers 01/24/2019 7:40 AM

## 2019-01-24 NOTE — Progress Notes (Signed)
Physical Therapy Treatment Patient Details Name: Leah Farmer MRN: [id number] DOB: Mar 16, 1965 Today's Date: 01/24/2019    History of Present Illness pt is a 53 y/o female with pmh significant for HTN, arthritis, DM admitted with primary malignant pancreatic tumors x2, s/p whipple procedure.    PT Comments    Pt with flat affect reporting 4/10 pain but willing to mobilize. Pt with improved transfers this session but limited by fatigue. Pt with increased gait trials today and Rt ankle overall stable with 2 periods of instability and cues for positioning. Pt with assist for pericare and in recliner with spouse end of session. Spouse pleased with pt progress and increased gait and ability to get to bathroom for toileting this session.   Pt with SpO2 93% on RA with drop to 87% on RA with 14' of gait with 2L applied for mobility with sats remaining >92% with return to RA end of session at 92%. HR up to 144 with limited gait and 128 end of session.   Follow Up Recommendations  CIR;Supervision for mobility/OOB     Equipment Recommendations  Rolling walker with 5" wheels    Recommendations for Other Services       Precautions / Restrictions Precautions Precautions: Fall Precaution Comments: 3 drains    Mobility  Bed Mobility Overal bed mobility: Needs Assistance Bed Mobility: Supine to Sit     Supine to sit: Min assist;HOB elevated     General bed mobility comments: pt able to transition to right side of bed with rail with HOB 30 degrees with cues and min assist  Transfers Overall transfer level: Needs assistance   Transfers: Sit to/from Stand Sit to Stand: Min assist;+2 safety/equipment         General transfer comment: cues for hand and Rt foot positioning with pt able to stand from bed, recliner and toilet with railing. +2 for safety and lines  Ambulation/Gait Ambulation/Gait assistance: Min assist;+2 safety/equipment Gait Distance (Feet): 30 Feet Assistive  device: Rolling walker (2 wheeled) Gait Pattern/deviations: Step-through pattern;Decreased stride length;Trunk flexed   Gait velocity interpretation: 1.31 - 2.62 ft/sec, indicative of limited community ambulator General Gait Details: pt walked 14' then reported dizziness with VSS. pt then able to walk 30' into hall and back to bathroom with cues for posture, hand placement on RW and RLE positioning as ankle rolling at times and pt needing cues for attention to foot placement   Stairs             Wheelchair Mobility    Modified Rankin (Stroke Patients Only)       Balance   Sitting-balance support: No upper extremity supported;Feet supported Sitting balance-[name]: Fair Sitting balance - Comments: able to sit EOB and at toilet without assist   Standing balance support: Bilateral upper extremity supported Standing balance-[name]: Poor Standing balance comment: reliant on bil UE support in standing                            Cognition Arousal/Alertness: Awake/alert Behavior During Therapy: Flat affect Overall Cognitive Status: Impaired/Different from baseline Area of Impairment: Memory;Following commands;Safety/judgement                     Memory: Decreased short-term memory Following Commands: Follows one step commands with increased time Safety/Judgement: Decreased awareness of deficits            Exercises      General Comments  Pertinent Vitals/Pain Pain Score: 4  Pain Location: abdominal incisions Pain Descriptors / Indicators: Discomfort;Grimacing;Guarding Pain Intervention(s): Limited activity within patient's tolerance;Monitored during session;Repositioned    Home Living                      Prior Function            PT Goals (current goals can now be found in the care plan section) Progress towards PT goals: Progressing toward goals    Frequency    Min 3X/week      PT Plan Current plan  remains appropriate    Co-evaluation              AM-PAC PT "6 Clicks" Mobility   Outcome Measure  Help needed turning from your back to your side while in a flat bed without using bedrails?: A Little Help needed moving from lying on your back to sitting on the side of a flat bed without using bedrails?: A Little Help needed moving to and from a bed to a chair (including a wheelchair)?: A Little Help needed standing up from a chair using your arms (e.g., wheelchair or bedside chair)?: A Little Help needed to walk in hospital room?: A Lot Help needed climbing 3-5 steps with a railing? : A Lot 6 Click Score: 16    End of Session Equipment Utilized During Treatment: Gait belt;Oxygen Activity Tolerance: Patient tolerated treatment well;Patient limited by fatigue Patient left: in chair;with call bell/phone within reach;with chair alarm set;with family/visitor present Nurse Communication: Mobility status PT Visit Diagnosis: Other abnormalities of gait and mobility (R26.89);Muscle weakness (generalized) (M62.81);Pain     Time: [id number] PT Time Calculation (min) (ACUTE ONLY): 34 min  Charges:  $Gait Training: 8-22 mins $Therapeutic Activity: 8-22 mins                     Jayme Mednick P, PT Acute Rehabilitation Services Pager: [phone] Office: [place] 01/24/2019, 1:57 PM

## 2019-01-24 NOTE — Progress Notes (Signed)
9 Days Post-Op   Subjective/Chief Complaint: Pt up at bedside. Feeling fatigued   Objective: Vital signs in last 24 hours: Temp:  [97.6 F (36.4 C)-99.5 F (37.5 C)] 97.7 F (36.5 C) (12/24 0815) Pulse Rate:  [108-128] 110 (12/24 0847) Resp:  [15-25] 16 (12/24 0847) BP: (123-163)/(70-113) 139/79 (12/24 0815) SpO2:  [93 %-100 %] 93 % (12/24 0815) Last BM Date: 01/14/19  Intake/Output from previous day: 12/23 0701 - 12/24 0700 In: 1884.2 [I.V.:1192.2; IV Piggyback:692] Out: [id number] [Urine:1950; Emesis/NG output:80; Drains:445] Intake/Output this shift: No intake/output data recorded.  PE: General appearance: alert, cooperative and no distress Resp: bibasilar crackles GI: soft, incisional tenderness; minimal bowel sounds Incisions c/d/i; drains with serous output  Lab Results:  Recent Labs    01/23/19 0546 01/24/19 0200  WBC 22.1* 20.7*  HGB 8.7* 8.8*  HCT 28.2* 27.3*  PLT 265 305   BMET Recent Labs    01/23/19 0546 01/24/19 0200  NA 144 145  K 3.6 3.7  CL 112* 114*  CO2 22 21*  GLUCOSE 270* 219*  BUN 24* 23*  CREATININE 0.88 0.92  CALCIUM 8.3* 8.2*   Studies/Results: CXR  Result Date: 01/23/2019 CLINICAL DATA:  Status post Whipple procedure. EXAM: PORTABLE CHEST 1 VIEW COMPARISON:  01/22/2019 FINDINGS: The endotracheal tube has been removed. The NG tube is stable. The right PICC line is stable. Worsening aeration status post extubation with low lung volumes and worsening bibasilar atelectasis. Probable small left effusion. IMPRESSION: 1. Removal of endotracheal tube with slight worsening aeration and increased bibasilar atelectasis. 2. Persistent small left effusion. Electronically Signed   By: Marijo Sanes M.D.   On: 01/23/2019 07:50   DG Abd Portable 1V  Result Date: 01/22/2019 CLINICAL DATA:  Enteric tube placement EXAM: PORTABLE ABDOMEN - 1 VIEW COMPARISON:  01/20/2019 CT abdomen/pelvis FINDINGS: Enteric tube terminates in proximal stomach with side  port in the gastric cardia region. Skin staples overlie the upper abdomen bilaterally. Surgical drains terminate over the medial left and upper right abdomen. No evidence of pneumatosis or pneumoperitoneum. IMPRESSION: Enteric tube terminates in the proximal stomach. Electronically Signed   By: Ilona Sorrel M.D.   On: 01/22/2019 14:33    Anti-infectives: Anti-infectives (From admission, onward)   Start     Dose/Rate Route Frequency Ordered Stop   01/19/19 1430  vancomycin (VANCOREADY) IVPB 1500 mg/300 mL  Status:  Discontinued     1,500 mg 150 mL/hr over 120 Minutes Intravenous Every 24 hours 01/19/19 1351 01/22/19 0952   01/19/19 1400  ceFEPIme (MAXIPIME) 2 g in sodium chloride 0.9 % 100 mL IVPB     2 g 200 mL/hr over 30 Minutes Intravenous Every 8 hours 01/19/19 1349     01/15/19 2000  ceFAZolin (ANCEF) IVPB 2g/100 mL premix     2 g 200 mL/hr over 30 Minutes Intravenous Every 8 hours 01/15/19 1712 01/15/19 2052   01/15/19 0600  ceFAZolin (ANCEF) IVPB 2g/100 mL premix     2 g 200 mL/hr over 30 Minutes Intravenous On call to O.R. 01/15/19 0549 01/15/19 1145      Assessment/Plan: s/p Procedure(s): WHIPPLE PROCEDURE (N/A) LAPAROSCOPY DIAGNOSTIC (N/A) DISTAL PANCREATECTOMY (N/A)  s/p Whipple- 12/15 Dr. Barry Dienes. Drainswith highamylasex2, but will leave all three in place (50, 225, 0cc o/p) ABL anemia-Hgb stable DM- 71u total in the last 24h, on resistant SSI and glargine but still hyperglycemic. Increaseglargine to 20u BID and SSI to remain.TRH to follow. Leukocytosis/ID-afebrile, WBC down to 22 today.CXR suspicious for pneumoniaand bibasilar atelectasis.  Vanc/cefepime started12/20. IR thoracentesis12/21, cx NGTD, but pleural fluid c/w exudative effusion.Blood Neg. Reintubated12/20, resp cx 12/21 re-incubated.CT C/A/P12/20 with peri-pancreatic 3.6x7.6x4.6cm fluid collection is not formed and no gas present. Vent dependent respiratory failure- extubated F/E/N-  ileus; onTPN,Ngt pulled out yesterday, replete phos Acidosis- likely related to blood loss/underresuscitation. CO2nearlynormal.  AKI- resolved Elevated LFTs- resolved   LOS: 9 days    Ralene Ok 01/24/2019

## 2019-01-25 DIAGNOSIS — G4733 Obstructive sleep apnea (adult) (pediatric): Secondary | ICD-10-CM

## 2019-01-25 LAB — GLUCOSE, CAPILLARY
Glucose-Capillary: 154 mg/dL — ABNORMAL HIGH (ref 70–99)
Glucose-Capillary: 154 mg/dL — ABNORMAL HIGH (ref 70–99)
Glucose-Capillary: 165 mg/dL — ABNORMAL HIGH (ref 70–99)
Glucose-Capillary: 158 mg/dL — ABNORMAL HIGH (ref 70–99)
Glucose-Capillary: 140 mg/dL — ABNORMAL HIGH (ref 70–99)
Glucose-Capillary: 156 mg/dL — ABNORMAL HIGH (ref 70–99)

## 2019-01-25 LAB — CBC
HCT: 27.9 % — ABNORMAL LOW (ref 36.0–46.0)
Hemoglobin: 8.9 g/dL — ABNORMAL LOW (ref 12.0–15.0)
MCH: 29.2 pg (ref 26.0–34.0)
MCHC: 31.9 g/dL (ref 30.0–36.0)
MCV: 91.5 fL (ref 80.0–100.0)
Platelets: 296 10*3/uL (ref 150–400)
RBC: 3.05 MIL/uL — ABNORMAL LOW (ref 3.87–5.11)
RDW: 17.2 % — ABNORMAL HIGH (ref 11.5–15.5)
WBC: 17.8 10*3/uL — ABNORMAL HIGH (ref 4.0–10.5)
nRBC: 1.5 % — ABNORMAL HIGH (ref 0.0–0.2)

## 2019-01-25 LAB — COMPREHENSIVE METABOLIC PANEL
ALT: 19 U/L (ref 0–44)
AST: 30 U/L (ref 15–41)
Albumin: 2.2 g/dL — ABNORMAL LOW (ref 3.5–5.0)
Alkaline Phosphatase: 68 U/L (ref 38–126)
Anion gap: 11 (ref 5–15)
BUN: 22 mg/dL — ABNORMAL HIGH (ref 6–20)
CO2: 23 mmol/L (ref 22–32)
Calcium: 8.2 mg/dL — ABNORMAL LOW (ref 8.9–10.3)
Chloride: 111 mmol/L (ref 98–111)
Creatinine, Ser: 0.64 mg/dL (ref 0.44–1.00)
GFR calc Af Amer: >60 mL/min (ref >60)
GFR calc non Af Amer: >60 mL/min (ref >60)
Glucose, Bld: 160 mg/dL — ABNORMAL HIGH (ref 70–99)
Potassium: 3.9 mmol/L (ref 3.5–5.1)
Sodium: 145 mmol/L (ref 135–145)
Total Bilirubin: 0.5 mg/dL (ref 0.3–1.2)
Total Protein: 5.5 g/dL — ABNORMAL LOW (ref 6.5–8.1)

## 2019-01-25 LAB — PHOSPHORUS: Phosphorus: 2.7 mg/dL (ref 2.5–4.6)

## 2019-01-25 LAB — MAGNESIUM: Magnesium: 1.9 mg/dL (ref 1.7–2.4)

## 2019-01-25 MED ORDER — TRAVASOL 10 % IV SOLN
INTRAVENOUS | Status: DC
  Filled 2019-01-25: qty 999

## 2019-01-25 MED ORDER — OCTREOTIDE ACETATE 100 MCG/ML IJ SOLN
200.0000 ug | Freq: Two times a day (BID) | INTRAMUSCULAR | Status: DC
  Filled 2019-01-25: qty 2

## 2019-01-25 MED ORDER — IPRATROPIUM BROMIDE 0.02 % IN SOLN: 0.5000 mg | Freq: Four times a day (QID) | RESPIRATORY_TRACT | Status: DC | PRN

## 2019-01-25 MED ORDER — MAGNESIUM SULFATE 2 GM/50ML IV SOLN
2.0000 g | Freq: Once | INTRAVENOUS | Status: AC
  Administered 2019-01-25: 2 g via INTRAVENOUS
  Filled 2019-01-25: qty 50

## 2019-01-25 MED ORDER — BISACODYL 10 MG RE SUPP
10.0000 mg | Freq: Every day | RECTAL | Status: DC | PRN
  Administered 2019-01-26: 10 mg via RECTAL
  Filled 2019-01-25: qty 1

## 2019-01-25 MED ORDER — POTASSIUM PHOSPHATES 15 MMOLE/5ML IV SOLN: 30.0000 mmol | Freq: Once | INTRAVENOUS | Status: DC

## 2019-01-25 MED ORDER — OCTREOTIDE ACETATE 1000 MCG/ML IJ SOLN
200.0000 ug | Freq: Two times a day (BID) | INTRAMUSCULAR | Status: DC
  Administered 2019-01-25: 200 ug via SUBCUTANEOUS
  Filled 2019-01-25 (×2): qty 0.2

## 2019-01-25 MED ORDER — LEVALBUTEROL HCL 0.63 MG/3ML IN NEBU: 0.6300 mg | INHALATION_SOLUTION | Freq: Four times a day (QID) | RESPIRATORY_TRACT | Status: DC | PRN

## 2019-01-25 MED ORDER — POTASSIUM CHLORIDE 10 MEQ/50ML IV SOLN: 10.0000 meq | INTRAVENOUS | Status: DC

## 2019-01-25 NOTE — Plan of Care (Signed)
  Problem: Education: Goal: Will demonstrate proper wound care and an understanding of methods to prevent future damage Outcome: Progressing   Problem: Bowel/Gastric: Goal: Gastrointestinal status for postoperative course will improve Outcome: Progressing   Problem: Nutritional: Goal: Will attain and maintain optimal nutritional status will improve Outcome: Progressing   Problem: Clinical Measurements: Goal: Postoperative complications will be avoided or minimized Outcome: Progressing   Problem: Respiratory: Goal: Will regain and/or maintain adequate ventilation Description: Utilize IS to goal of 1750 by 01/24/19 q hour x 10 w/a. Monitor for s/s of pneumonia (PCXR = LLL pneumonia 01/19/19) Outcome: Progressing   Problem: Skin Integrity: Goal: Wound healing without signs and symptoms of infection will improve Outcome: Progressing Goal: Risk for impaired skin integrity will decrease Outcome: Progressing   Problem: Tissue Perfusion: Goal: Risk of venous thrombosis will decrease Outcome: Progressing   Problem: Urinary Elimination: Goal: Ability to achieve and maintain adequate urine output will improve Outcome: Progressing   Problem: Education: Goal: Knowledge of General Education information will improve Description: Including pain rating scale, medication(s)/side effects and non-pharmacologic comfort measures Outcome: Progressing   Problem: Health Behavior/Discharge Planning: Goal: Ability to manage health-related needs will improve Outcome: Progressing   Problem: Clinical Measurements: Goal: Ability to maintain clinical measurements within normal limits will improve Outcome: Progressing Goal: Will remain free from infection Outcome: Progressing Goal: Diagnostic test results will improve Outcome: Progressing Goal: Respiratory complications will improve Outcome: Progressing Goal: Cardiovascular complication will be avoided Outcome: Progressing   Problem:  Activity: Goal: Risk for activity intolerance will decrease Outcome: Progressing   Problem: Nutrition: Goal: Adequate nutrition will be maintained Outcome: Progressing   Problem: Coping: Goal: Level of anxiety will decrease Outcome: Progressing   Problem: Elimination: Goal: Will not experience complications related to bowel motility Outcome: Progressing Goal: Will not experience complications related to urinary retention Outcome: Progressing   Problem: Pain Managment: Goal: General experience of comfort will improve Outcome: Progressing   Problem: Safety: Goal: Ability to remain free from injury will improve Outcome: Progressing   Problem: Skin Integrity: Goal: Risk for impaired skin integrity will decrease Outcome: Progressing

## 2019-01-25 NOTE — Progress Notes (Signed)
PROGRESS NOTE    Leah Farmer  [id number] DOB: 1965-05-27 DOA: 01/15/2019 PCP: Margy Clarks, NP   Brief Narrative: Leah Farmer is a 53 y.o. female, PMH of type II DM, HTN, GERD, anxiety and depression, NAFL, primary malignant neuroendocrine tumor of the pancreas x2, s/p diagnostic laparoscopy, classic pancreatico duodenectomy, distal pancreatectomy, placement of biliary and pancreatic duct stents by [facility] on 12/15, TRH was consulted on 12/16 to assist with management of uncontrolled DM and pneumonia.  Postop course complicated by postop ileus, sinus tachycardia, expected acute blood loss anemia and electrolyte abnormalities in addition to respiratory failure requiring short term intubation.    Assessment & Plan:   Principal Problem:   Primary malignant neuroendocrine neoplasm of pancreas [facility]) Active Problems:   Pancreatic tumor   Diabetes mellitus type 2 in obese ([place])   HCAP (healthcare-associated pneumonia)   Essential hypertension   Depression   Pleural effusion, left   Acute respiratory failure with hypoxia ([facility])   OSA (obstructive sleep apnea)   Diabetes mellitus, type 2, uncontrolled with hyperglycemia Patient's A1C is 7.5% from 12/11. On Janumet XR as an outpatient. No worsened in setting of pancreatectomy and TPN. On Lantus 20 units BID, SSI and insulin in TPN. CBG improved this morning. -Continue Lantus and SSI -Pharmacy to adjust inulin in TPN  Essential hypertension -Continue metoprolol  Wheezing No history of reactive airway/asthma/COPD. Wheezing improved today -Continue Xopenex -Will try to avoid prednisone  Sinus tachycardia Persistent. Patient is on metoprolol. Likely multifactorial. Patient states she has a history of this chronically. -Can discontinue telemetry from this standpoint as needed  Possible pneumonia Chest x-ray concerning for possible pneumonia vs atelectasis. Associated leukocytosis in setting of recent surgery in addition to  acute respiratory failure requiring intubation. Now extubated. -Continue Cefepime -Continue incentive spirometer  Acute respiratory failure with hypoxia Likely secondary to possible Pneumonia/atelectasis/effusions. Required intubation briefly from 12/21 to 12/22. Now on room air. Concern for possible OSA now using CPAP -Continue CPAP qhs  Primary malignant neuroendocrine pancreatic tumor Patient is s/p pancreaticoduodenectomy and hepaticojejunostomy.  -Per general surgery  Acute blood loss anemia Post op. Required 3 units of PRBC.  Leukocytosis In setting of recent surgery, possible pneumonia. Blood cultures with no growth to date. Patient was started on Vancomycin and Cefepime empirically and transitioned to Cefepime -Continue Cefepime  Acute kidney injury Peak creatinine of 1.55. Baseline appears to be around 1. Resolved.  Hypomagnesemia Hypophosphatemia Per pharmacy while on TPN  Metabolic acidosis Resolved.  Prolonged QTc Last EKG with normal QTc  Morbid obesity Body mass index is 44.06 kg/m.  Anxiety Patient states she has panic attacks -Continue ativan prn  Abnormal LFTs Transient. Resolved.  Right foot drop   DVT prophylaxis: Lovenox Code Status:   Code Status: Full Code Family Communication: None Disposition Plan: Discharge per primary and continued medical improvement   Procedures:   s/p diagnostic laparoscopy, classic pancreatico duodenectomy, distal pancreatectomy, placement of biliary and pancreatic duct stents by [facility] on 12/15  Has 2 abdominal drains on the right and one on the left.  Right IJ central line and Foley catheter have been discontinued.  Antimicrobials:  Vancomycin  Cefepime    Subjective: No issues overnight other than some abdominal pain where she had surgery  Objective: Vitals:   01/25/19 0321 01/25/19 0805 01/25/19 0848 01/25/19 0851  BP: 137/90 (!) 158/78    Pulse: (!) 112 (!) 109    Resp: 17 19    Temp: 98 F  (36.7 C) 97.9 F (36.6  C)    TempSrc: Oral Oral    SpO2: 97% 97% 97% 98%  Weight:      Height:        Intake/Output Summary (Last 24 hours) at 01/25/2019 0921 Last data filed at 01/25/2019 0549 Gross per 24 hour  Intake 3048.5 ml  Output 1225 ml  Net 1823.5 ml   Filed Weights   01/15/19 0612 01/21/19 1004 01/22/19 0318  Weight: 114.3 kg 117.3 kg 120.1 kg    Examination:  General exam: Appears calm and comfortable Respiratory system: Mild wheezing. Respiratory effort normal. Cardiovascular system: S1 & S2 heard, RRR. No murmurs, rubs, gallops or clicks. Gastrointestinal system: Abdomen is nondistended, soft and nontender. Normal bowel sounds heard. Central nervous system: Alert and oriented. No focal neurological deficits. Extremities: No edema. No calf tenderness Skin: No cyanosis. No rashes Psychiatry: Judgement and insight appear normal. Mood & affect appropriate.     Data Reviewed: I have personally reviewed following labs and imaging studies  CBC: Recent Labs  Lab 01/21/19 0250 01/21/19 0430 01/22/19 0414 01/23/19 0546 01/24/19 0200 01/25/19 0433  WBC 30.2*  --  25.1* 22.1* 20.7* 17.8*  NEUTROABS  --   --  18.7*  --   --   --   HGB 7.3* 6.8* 8.6* 8.7* 8.8* 8.9*  HCT 24.0* 20.0* 27.7* 28.2* 27.3* 27.9*  MCV 94.5  --  93.6 93.4 91.0 91.5  PLT 260  --  233 265 305 0000000   Basic Metabolic Panel: Recent Labs  Lab 01/21/19 0250 01/21/19 0430 01/22/19 0545 01/23/19 0546 01/24/19 0200 01/25/19 0433  NA 138 137 143 144 145 145  K 3.7 3.3* 4.0 3.6 3.7 3.9  CL 105  --  111 112* 114* 111  CO2 19*  --  21* 22 21* 23  GLUCOSE 151*  --  195* 270* 219* 160*  BUN 26*  --  23* 24* 23* 22*  CREATININE 1.07*  --  0.90 0.88 0.92 0.64  CALCIUM 7.9*  --  8.1* 8.3* 8.2* 8.2*  MG 2.2  --  2.3 2.1 2.1 1.9  PHOS 2.3*  --  2.5 1.4* 1.8* 2.7   GFR: Estimated Creatinine Clearance: 106.7 mL/min (by C-G formula based on SCr of 0.64 mg/dL). Liver Function Tests: Recent  Labs  Lab 01/21/19 0250 01/22/19 0545 01/23/19 0546 01/24/19 0200 01/25/19 0433  AST 26 18 21 27 30   ALT 24 16 17 18 19   ALKPHOS 60 51 62 61 68  BILITOT 1.0 1.3* 1.1 0.9 0.5  PROT 6.7 6.0* 5.8* 5.8* 5.5*  ALBUMIN 3.8 3.5 2.7* 2.5* 2.2*   No results for input(s): LIPASE, AMYLASE in the last 168 hours. No results for input(s): AMMONIA in the last 168 hours. Coagulation Profile: No results for input(s): INR, PROTIME in the last 168 hours. Cardiac Enzymes: No results for input(s): CKTOTAL, CKMB, CKMBINDEX, TROPONINI in the last 168 hours. BNP (last 3 results) No results for input(s): PROBNP in the last 8760 hours. HbA1C: No results for input(s): HGBA1C in the last 72 hours. CBG: Recent Labs  Lab 01/24/19 1625 01/24/19 2017 01/24/19 2346 01/25/19 0323 01/25/19 0834  GLUCAP 222* 171* 178* 154* 154*   Lipid Profile: No results for input(s): CHOL, HDL, LDLCALC, TRIG, CHOLHDL, LDLDIRECT in the last 72 hours. Thyroid Function Tests: No results for input(s): TSH, T4TOTAL, FREET4, T3FREE, THYROIDAB in the last 72 hours. Anemia Panel: No results for input(s): VITAMINB12, FOLATE, FERRITIN, TIBC, IRON, RETICCTPCT in the last 72 hours. Sepsis Labs: No results  for input(s): PROCALCITON, LATICACIDVEN in the last 168 hours.  Recent Results (from the past 240 hour(s))  MRSA PCR Screening     Status: None   Collection Time: 01/15/19  5:33 PM   Specimen: Nasal Mucosa; Nasopharyngeal  Result Value Ref Range Status   MRSA by PCR NEGATIVE NEGATIVE Final    Comment:        The GeneXpert MRSA Assay (FDA approved for NASAL specimens only), is one component of a comprehensive MRSA colonization surveillance program. It is not intended to diagnose MRSA infection nor to guide or monitor treatment for MRSA infections. Performed at [facility], [place] [address] [place], [place] [address]   Culture, blood (routine x 2)     Status: None   Collection Time: 01/19/19 10:42 AM    Specimen: BLOOD RIGHT HAND  Result Value Ref Range Status   Specimen Description BLOOD RIGHT HAND  Final   Special Requests   Final    BOTTLES DRAWN AEROBIC AND ANAEROBIC Blood Culture adequate volume   Culture   Final    NO GROWTH 5 DAYS Performed at [facility], [place] [address], [place], [place] [address]    Report Status 01/24/2019 FINAL  Final  Culture, blood (routine x 2)     Status: None   Collection Time: 01/19/19 10:50 AM   Specimen: BLOOD  Result Value Ref Range Status   Specimen Description BLOOD BLOOD RIGHT WRIST  Final   Special Requests   Final    BOTTLES DRAWN AEROBIC AND ANAEROBIC Blood Culture adequate volume   Culture   Final    NO GROWTH 5 DAYS Performed at [facility], [place] [address], [place], [place] [address]    Report Status 01/24/2019 FINAL  Final  Culture, respiratory (non-expectorated)     Status: None   Collection Time: 01/21/19 10:32 AM   Specimen: Tracheal Aspirate; Respiratory  Result Value Ref Range Status   Specimen Description TRACHEAL ASPIRATE  Final   Special Requests NONE  Final   Gram Stain   Final    RARE WBC PRESENT, PREDOMINANTLY PMN RARE YEAST Performed at [facility], [place] [address], [place], [place] [address]    Culture FEW CANDIDA LUSITANIAE  Final   Report Status 01/24/2019 FINAL  Final  Culture, body fluid-bottle     Status: None (Preliminary result)   Collection Time: 01/21/19 10:39 AM   Specimen: Pleura  Result Value Ref Range Status   Specimen Description PLEURAL LEFT  Final   Special Requests NONE  Final   Culture   Final    NO GROWTH 4 DAYS Performed at [facility], [address] [place], [place] [address]    Report Status PENDING  Incomplete  Gram stain     Status: None   Collection Time: 01/21/19 10:39 AM   Specimen: Pleura  Result Value Ref Range Status   Specimen Description PLEURAL LEFT  Final   Special Requests NONE  Final   Gram Stain   Final    FEW WBC PRESENT, PREDOMINANTLY  PMN NO ORGANISMS SEEN Performed at [facility], [place] [address], [place], [place] [address]    Report Status 01/21/2019 FINAL  Final  Culture, respiratory (non-expectorated)     Status: None   Collection Time: 01/22/19  9:45 AM   Specimen: Tracheal Aspirate; Respiratory  Result Value Ref Range Status   Specimen Description TRACHEAL ASPIRATE  Final   Special Requests NONE  Final   Gram Stain NO WBC SEEN NO ORGANISMS SEEN   Final   Culture   Final    Consistent with normal respiratory flora. Performed at [facility], [place] [address] [place], [place] [address]    Report Status 01/24/2019 FINAL  Final         Radiology Studies: DG CHEST PORT 1 VIEW  Result Date: 01/24/2019 CLINICAL DATA:  Wheezing EXAM: PORTABLE CHEST 1 VIEW COMPARISON:  Portable exam levin 34 hours compared to 01/23/2019 FINDINGS: Tip of RIGHT arm PICC line projects over SVC. Interval removal of nasogastric tube. Enlargement of cardiac silhouette. Mediastinal contours and pulmonary vascularity normal. Persistent mild RIGHT basilar atelectasis. Increased atelectasis versus consolidation of LEFT lower lobe. Associated LEFT pleural effusion, increased. Upper lungs clear. No pneumothorax. IMPRESSION: Increased atelectasis versus consolidation of LEFT lower lobe and LEFT pleural effusion. Subsegmental atelectasis RIGHT base. Electronically Signed   By: Lavonia Dana M.D.   On: 01/24/2019 11:57        Scheduled Meds: . sodium chloride   Intravenous Once  . Chlorhexidine Gluconate Cloth  6 each Topical Daily  . enoxaparin (LOVENOX) injection  40 mg Subcutaneous Q24H  . insulin aspart  0-20 Units Subcutaneous Q4H  . insulin glargine  20 Units Subcutaneous BID  . ipratropium  0.5 mg Nebulization Q6H  . levalbuterol  0.63 mg Nebulization Q6H  . metoprolol tartrate  25 mg Per Tube BID   Continuous Infusions: . sodium chloride 50 mL/hr at 01/25/19 0537  . ceFEPime (MAXIPIME) IV 2 g (01/25/19 0543)  . TPN  ADULT (ION) 60 mL/hr at 01/24/19 1752     LOS: 10 days     Cordelia Poche, MD [facility] 01/25/2019, 9:21 AM  If 7PM-7AM, please contact night-coverage [email or web address]

## 2019-01-25 NOTE — Progress Notes (Signed)
Pt states she does not wear a CPAP at home QHS and ref to wear.Marland Kitchen

## 2019-01-25 NOTE — Progress Notes (Signed)
10 Days Post-Op   Subjective/Chief Complaint: Pt feeling somewhat better today Ambulating   Objective: Vital signs in last 24 hours: Temp:  [97.6 F (36.4 C)-99.4 F (37.4 C)] 97.9 F (36.6 C) (12/25 0805) Pulse Rate:  [104-123] 109 (12/25 0805) Resp:  [16-22] 19 (12/25 0805) BP: (137-166)/(76-90) 158/78 (12/25 0805) SpO2:  [93 %-98 %] 98 % (12/25 0851) Last BM Date: 01/14/19  Intake/Output from previous day: 12/24 0701 - 12/25 0700 In: 3048.5 [I.V.:2215.6; IV Piggyback:832.9] Out: 1225 [Urine:375; Drains:850] Intake/Output this shift: No intake/output data recorded.  PE: General appearance:alert, cooperative and no distress Resp:bibasilar crackles [id number], incisional tenderness; minimal bowel sounds Incisions c/d/i; drains with serous output  Lab Results:  Recent Labs    01/24/19 0200 01/25/19 0433  WBC 20.7* 17.8*  HGB 8.8* 8.9*  HCT 27.3* 27.9*  PLT 305 296   BMET Recent Labs    01/24/19 0200 01/25/19 0433  NA 145 145  K 3.7 3.9  CL 114* 111  CO2 21* 23  GLUCOSE 219* 160*  BUN 23* 22*  CREATININE 0.92 0.64  CALCIUM 8.2* 8.2*   Studies/Results: DG CHEST PORT 1 VIEW  Result Date: 01/24/2019 CLINICAL DATA:  Wheezing EXAM: PORTABLE CHEST 1 VIEW COMPARISON:  Portable exam levin 34 hours compared to 01/23/2019 FINDINGS: Tip of RIGHT arm PICC line projects over SVC. Interval removal of nasogastric tube. Enlargement of cardiac silhouette. Mediastinal contours and pulmonary vascularity normal. Persistent mild RIGHT basilar atelectasis. Increased atelectasis versus consolidation of LEFT lower lobe. Associated LEFT pleural effusion, increased. Upper lungs clear. No pneumothorax. IMPRESSION: Increased atelectasis versus consolidation of LEFT lower lobe and LEFT pleural effusion. Subsegmental atelectasis RIGHT base. Electronically Signed   By: Lavonia Dana M.D.   On: 01/24/2019 11:57    Anti-infectives: Anti-infectives (From admission, onward)   Start      Dose/Rate Route Frequency Ordered Stop   01/19/19 1430  vancomycin (VANCOREADY) IVPB 1500 mg/300 mL  Status:  Discontinued     1,500 mg 150 mL/hr over 120 Minutes Intravenous Every 24 hours 01/19/19 1351 01/22/19 0952   01/19/19 1400  ceFEPIme (MAXIPIME) 2 g in sodium chloride 0.9 % 100 mL IVPB     2 g 200 mL/hr over 30 Minutes Intravenous Every 8 hours 01/19/19 1349     01/15/19 2000  ceFAZolin (ANCEF) IVPB 2g/100 mL premix     2 g 200 mL/hr over 30 Minutes Intravenous Every 8 hours 01/15/19 1712 01/15/19 2052   01/15/19 0600  ceFAZolin (ANCEF) IVPB 2g/100 mL premix     2 g 200 mL/hr over 30 Minutes Intravenous On call to O.R. 01/15/19 0549 01/15/19 1145      Assessment/Plan: s/p Whipple- 12/15 Dr. Barry Dienes. Drainswith highamylasex2, but will leave all three in place (250, 600, 0cc o/p)  Will add Octreotide to see if this slows down drain output ABL anemia-Hgb stable DM- 71u total in the last 24h, on resistant SSI and glargine but still hyperglycemic. Increaseglargine to 20u BID and SSI to remain.TRH to follow. Leukocytosis/ID-afebrile, WBC down to 22today.CXR suspicious for pneumoniaandbibasilar atelectasis.Vanc/cefepime started12/20. IR thoracentesis12/21, cx NGTD, but pleural fluid c/w exudative effusion.Blood Neg. Reintubated12/20, resp cx 12/21 re-incubated.CT C/A/P12/20 with peri-pancreatic 3.6x7.6x4.6cm fluid collection is not formed and no gas present. Vent dependent respiratory failure-extubated F/E/N- ileus; onTPN,Ngt pulled out yesterday, replete phos Acidosis- likely related to blood loss/underresuscitation. CO2nearlynormal.  AKI- resolved Elevated LFTs- resolved  LOS: 10 days    Leah Farmer 01/25/2019

## 2019-01-25 NOTE — Progress Notes (Addendum)
PHARMACY - TOTAL PARENTERAL NUTRITION CONSULT NOTE  Indication: Prolonged ileus  Patient Measurements: Height: 5\' 5"  (165.1 cm) Weight: 264 lb 12.4 oz (120.1 kg) IBW/kg (Calculated) : 57 TPN AdjBW (KG): 69.6 Body mass index is 44.06 kg/m. Usual Weight: ~113.6 kg  Assessment:  25 yof with a neuroendocrine tumor of the pancreas admitted for surgery and is now post-op Whipple procedure and distal pancreatectomy on 12/15. Pathology is pending. She was started on clear liquids 12/18 with fair appetite and advanced to regular diet 12/20 however had respiratory distress and hypoxia overnight 12/20 requiring re-intubation (extubated 12/22). CT abd/pelvis shows likely SB ileus.  Glucose / Insulin: hx DM with A1c 7.5%, on Janumet PTA.  CBGs improved, 154-171 since new TPN hung at 1800 PM.  Received 12 units SSI + Lantus 20 units BID + 50 units regular insulin in TPN in last 24 hrs Electrolytes: K 3.9 (goal >= 4 for ileus),  Phos improved 2.7, Mg 1.9, others WNL Renal: SCr 0.92, BUN 23 LFTs / TGs: LFTs / tbili within normal limits, TG 256>241 Prealbumin / albumin: albumin 2.7 Intake / Output; MIVF: drain O/P 856mL, NGT removed, UOP not all measured. Octreotide added.  GI Imaging: 12/20 CT abd/pelvis - distended gas filled loops of colon and SB likely due to ileus, small amount pelvic free fluid, probable mild hepatic steatosis Surgeries / Procedures:  12/15 Diagnostic laparoscopy, classic pancreaticoduodenectomy, distal pancreatectomy, placement of biliary and pancreatic duct stent  12/21 IR thoracentesis yielding 300 ml clear yellow fluid  Central access: double lumen PICC placed 01/21/19 TPN start date: 01/21/19  Nutritional Goals (per RD rec on 12/22): 2100-2300 kCal, 110-125g protein, ~2L fluid per day  Current Nutrition:  NPO and TPN  Plan:  Increase TPN cautiously (due to hyperglycemia) to 75 ml/hr (goal rate 90 ml/hr) TPN will provide 100g AA, 243g CHO and 54g ILE for a total of  1766 kCal, meeting ~85% of patient needs Electrolytes in TPN: continue reduced Na and increased Phos (potassium and magnesium will increase with increasing rate), max acetate  Add standard MVI on MWF and trace elements daily to TPN Continue resistant SSI Q4H + Lantus 20 units BID per MD + increase regular insulin in TPN to 65 units (Pharmacy to adjust insulin in TPN per TRH) Magnesium 2g IV x1. F/U AM labs, volume status and the need to concentrate TPN, CBGs to advance TPN, watch TG  Sloan Leiter, PharmD, BCPS, BCCCP Clinical Pharmacist Please refer to [facility] for [place] numbers 01/25/2019 9:38 AM

## 2019-01-26 ENCOUNTER — Inpatient Hospital Stay (HOSPITAL_COMMUNITY): Payer: BC Managed Care – PPO

## 2019-01-26 LAB — MAGNESIUM: Magnesium: 2.1 mg/dL (ref 1.7–2.4)

## 2019-01-26 LAB — CBC
HCT: 31.7 % — ABNORMAL LOW (ref 36.0–46.0)
Hemoglobin: 9.7 g/dL — ABNORMAL LOW (ref 12.0–15.0)
MCH: 28.6 pg (ref 26.0–34.0)
MCHC: 30.6 g/dL (ref 30.0–36.0)
MCV: 93.5 fL (ref 80.0–100.0)
Platelets: 308 10*3/uL (ref 150–400)
RBC: 3.39 MIL/uL — ABNORMAL LOW (ref 3.87–5.11)
RDW: 17.1 % — ABNORMAL HIGH (ref 11.5–15.5)
WBC: 20.7 10*3/uL — ABNORMAL HIGH (ref 4.0–10.5)
nRBC: 0.9 % — ABNORMAL HIGH (ref 0.0–0.2)

## 2019-01-26 LAB — BASIC METABOLIC PANEL
Anion gap: 8 (ref 5–15)
BUN: 21 mg/dL — ABNORMAL HIGH (ref 6–20)
CO2: 24 mmol/L (ref 22–32)
Calcium: 8.1 mg/dL — ABNORMAL LOW (ref 8.9–10.3)
Chloride: 104 mmol/L (ref 98–111)
Creatinine, Ser: 0.69 mg/dL (ref 0.44–1.00)
GFR calc Af Amer: >60 mL/min (ref >60)
GFR calc non Af Amer: >60 mL/min (ref >60)
Glucose, Bld: 189 mg/dL — ABNORMAL HIGH (ref 70–99)
Potassium: 4.8 mmol/L (ref 3.5–5.1)
Sodium: 136 mmol/L (ref 135–145)

## 2019-01-26 LAB — GLUCOSE, CAPILLARY
Glucose-Capillary: 114 mg/dL — ABNORMAL HIGH (ref 70–99)
Glucose-Capillary: 134 mg/dL — ABNORMAL HIGH (ref 70–99)
Glucose-Capillary: 169 mg/dL — ABNORMAL HIGH (ref 70–99)
Glucose-Capillary: 124 mg/dL — ABNORMAL HIGH (ref 70–99)
Glucose-Capillary: 106 mg/dL — ABNORMAL HIGH (ref 70–99)

## 2019-01-26 LAB — CULTURE, BODY FLUID W GRAM STAIN -BOTTLE: Culture: NO GROWTH

## 2019-01-26 LAB — PHOSPHORUS: Phosphorus: 3.5 mg/dL (ref 2.5–4.6)

## 2019-01-26 MED ORDER — TRAVASOL 10 % IV SOLN
INTRAVENOUS | Status: DC
  Filled 2019-01-26: qty 1242

## 2019-01-26 MED ORDER — TRAVASOL 10 % IV SOLN
INTRAVENOUS | Status: AC
  Filled 2019-01-26: qty 1242

## 2019-01-26 MED ORDER — OCTREOTIDE ACETATE 100 MCG/ML IJ SOLN
200.0000 ug | Freq: Two times a day (BID) | INTRAMUSCULAR | Status: DC
  Administered 2019-01-26 – 2019-01-31 (×10): 200 ug via SUBCUTANEOUS
  Filled 2019-01-26 (×12): qty 2

## 2019-01-26 NOTE — Plan of Care (Signed)
  Problem: Education: Goal: Will demonstrate proper wound care and an understanding of methods to prevent future damage Outcome: Progressing   Problem: Bowel/Gastric: Goal: Gastrointestinal status for postoperative course will improve Outcome: Progressing   Problem: Nutritional: Goal: Will attain and maintain optimal nutritional status will improve Outcome: Progressing   Problem: Clinical Measurements: Goal: Postoperative complications will be avoided or minimized Outcome: Progressing   Problem: Respiratory: Goal: Will regain and/or maintain adequate ventilation Description: Utilize IS to goal of 1750 by 01/24/19 q hour x 10 w/a. Monitor for s/s of pneumonia (PCXR = LLL pneumonia 01/19/19) Outcome: Progressing   Problem: Skin Integrity: Goal: Wound healing without signs and symptoms of infection will improve Outcome: Progressing Goal: Risk for impaired skin integrity will decrease Outcome: Progressing   Problem: Tissue Perfusion: Goal: Risk of venous thrombosis will decrease Outcome: Progressing   Problem: Urinary Elimination: Goal: Ability to achieve and maintain adequate urine output will improve Outcome: Progressing   Problem: Education: Goal: Knowledge of General Education information will improve Description: Including pain rating scale, medication(s)/side effects and non-pharmacologic comfort measures Outcome: Progressing   Problem: Health Behavior/Discharge Planning: Goal: Ability to manage health-related needs will improve Outcome: Progressing   Problem: Clinical Measurements: Goal: Ability to maintain clinical measurements within normal limits will improve Outcome: Progressing Goal: Will remain free from infection Outcome: Progressing Goal: Diagnostic test results will improve Outcome: Progressing Goal: Respiratory complications will improve Outcome: Progressing Goal: Cardiovascular complication will be avoided Outcome: Progressing   Problem:  Activity: Goal: Risk for activity intolerance will decrease Outcome: Progressing   Problem: Nutrition: Goal: Adequate nutrition will be maintained Outcome: Progressing   Problem: Coping: Goal: Level of anxiety will decrease Outcome: Progressing   Problem: Elimination: Goal: Will not experience complications related to bowel motility Outcome: Progressing Goal: Will not experience complications related to urinary retention Outcome: Progressing   Problem: Pain Managment: Goal: General experience of comfort will improve Outcome: Progressing   Problem: Safety: Goal: Ability to remain free from injury will improve Outcome: Progressing   Problem: Skin Integrity: Goal: Risk for impaired skin integrity will decrease Outcome: Progressing

## 2019-01-26 NOTE — Progress Notes (Signed)
11 Days Post-Op   Subjective/Chief Complaint: Pt continues to slowly feel somewhat better each day Ambulating Tol ice chips without n/v; hungry Passing gas, had bm this am with enema  Objective: Vital signs in last 24 hours: Temp:  [97.8 F (36.6 C)-99.2 F (37.3 C)] 98.1 F (36.7 C) (12/26 0410) Pulse Rate:  [102-110] 106 (12/26 0410) Resp:  [12-17] 13 (12/26 0410) BP: (137-156)/(72-83) 156/73 (12/26 0410) SpO2:  [92 %-99 %] 97 % (12/26 0410) Last BM Date: 01/14/19  Intake/Output from previous day: 12/25 0701 - 12/26 0700 In: 1038.6 [I.V.:738.6; IV Piggyback:300] Out: 1250 [Urine:725; Drains:525] Intake/Output this shift: No intake/output data recorded.  PE: General appearance:alert, cooperative and no distress Resp:bibasilar crackles [id number], incisional tenderness; minimal bowel sounds; drains with clear serous output Incisions c/d/i; drains with serous output  Lab Results:  Recent Labs    01/24/19 0200 01/25/19 0433  WBC 20.7* 17.8*  HGB 8.8* 8.9*  HCT 27.3* 27.9*  PLT 305 296   BMET Recent Labs    01/24/19 0200 01/25/19 0433  NA 145 145  K 3.7 3.9  CL 114* 111  CO2 21* 23  GLUCOSE 219* 160*  BUN 23* 22*  CREATININE 0.92 0.64  CALCIUM 8.2* 8.2*   Studies/Results: DG CHEST PORT 1 VIEW  Result Date: 01/24/2019 CLINICAL DATA:  Wheezing EXAM: PORTABLE CHEST 1 VIEW COMPARISON:  Portable exam levin 34 hours compared to 01/23/2019 FINDINGS: Tip of RIGHT arm PICC line projects over SVC. Interval removal of nasogastric tube. Enlargement of cardiac silhouette. Mediastinal contours and pulmonary vascularity normal. Persistent mild RIGHT basilar atelectasis. Increased atelectasis versus consolidation of LEFT lower lobe. Associated LEFT pleural effusion, increased. Upper lungs clear. No pneumothorax. IMPRESSION: Increased atelectasis versus consolidation of LEFT lower lobe and LEFT pleural effusion. Subsegmental atelectasis RIGHT base. Electronically Signed   By:  Lavonia Dana M.D.   On: 01/24/2019 11:57    Anti-infectives: Anti-infectives (From admission, onward)   Start     Dose/Rate Route Frequency Ordered Stop   01/19/19 1430  vancomycin (VANCOREADY) IVPB 1500 mg/300 mL  Status:  Discontinued     1,500 mg 150 mL/hr over 120 Minutes Intravenous Every 24 hours 01/19/19 1351 01/22/19 0952   01/19/19 1400  ceFEPIme (MAXIPIME) 2 g in sodium chloride 0.9 % 100 mL IVPB     2 g 200 mL/hr over 30 Minutes Intravenous Every 8 hours 01/19/19 1349     01/15/19 2000  ceFAZolin (ANCEF) IVPB 2g/100 mL premix     2 g 200 mL/hr over 30 Minutes Intravenous Every 8 hours 01/15/19 1712 01/15/19 2052   01/15/19 0600  ceFAZolin (ANCEF) IVPB 2g/100 mL premix     2 g 200 mL/hr over 30 Minutes Intravenous On call to O.R. 01/15/19 0549 01/15/19 1145      Assessment/Plan: s/p Whipple- 12/15 Dr. Barry Dienes. Drainswith highamylasex2, but will leave all three in place (250, 600, 0cc o/p)  Will add Octreotide to see if this slows down drain output ABL anemia-Hgb stable Pancreatic leak/drain amylase up - drain output has increased; was started on octreotide yesterday DM- On resistant SSI and glargine. TRH following - appreciate assistance. Leukocytosis/ID-afebrile, WBC down to 17today.CXR suspicious for pneumoniaandbibasilar atelectasis.Vanc/cefepime started12/20. IR thoracentesis12/21, cx NGTD, but pleural fluid c/w exudative effusion.Blood Neg. Reintubated12/20, resp cx 12/21 re-incubated.CT C/A/P12/20 with peri-pancreatic 3.6 x 7.6 x 4.6cm fluid collection is not formed and no gas present. Vent dependent respiratory failure-extubated F/E/N- ileus appears to be resolved; onTPN, start bariatric clears (since we don't have diabetic clears  as a diet option here)  Acidosis- resolved AKI- resolved Elevated LFTs- resolved   LOS: 11 days   Ileana Roup 01/26/2019

## 2019-01-26 NOTE — Progress Notes (Signed)
PROGRESS NOTE    Leah Farmer  [id number] DOB: Oct 30, 1965 DOA: 01/15/2019 PCP: Margy Clarks, NP   Brief Narrative: Leah Farmer is a 53 y.o. female, PMH of type II DM, HTN, GERD, anxiety and depression, NAFL, primary malignant neuroendocrine tumor of the pancreas x2, s/p diagnostic laparoscopy, classic pancreatico duodenectomy, distal pancreatectomy, placement of biliary and pancreatic duct stents by [facility] on 12/15, TRH was consulted on 12/16 to assist with management of uncontrolled DM and pneumonia.  Postop course complicated by postop ileus, sinus tachycardia, expected acute blood loss anemia and electrolyte abnormalities in addition to respiratory failure requiring short term intubation.    Assessment & Plan:   Principal Problem:   Primary malignant neuroendocrine neoplasm of pancreas [facility]) Active Problems:   Pancreatic tumor   Diabetes mellitus type 2 in obese ([place])   HCAP (healthcare-associated pneumonia)   Essential hypertension   Depression   Pleural effusion, left   Acute respiratory failure with hypoxia (HCC)   OSA (obstructive sleep apnea)   Diabetes mellitus, type 2, uncontrolled with hyperglycemia Patient's A1C is 7.5% from 12/11. On Janumet XR as an outpatient. No worsened in setting of pancreatectomy and TPN. On Lantus 20 units BID, SSI and insulin in TPN. Blood sugar much better controlled. -Continue Lantus and SSI -Pharmacy to adjust inulin in TPN  Essential hypertension -Continue metoprolol  Wheezing No history of reactive airway/asthma/COPD. Wheezing resolved -Continue Xopenex  Sinus tachycardia Persistent. Patient is on metoprolol. Likely multifactorial. Patient states she has a history of this chronically. -Can discontinue telemetry from this standpoint as needed  Possible pneumonia Chest x-ray concerning for possible pneumonia vs atelectasis. Associated leukocytosis in setting of recent surgery in addition to acute respiratory failure  requiring intubation. Now extubated. Repeat chest x-ray showing re-accumulating pleural effusion -Continue Cefepime. Would treat for a total of 8 days, however, if concern for intraabdominal component, would defer duration to primary -Continue incentive spirometer -Chest x-ray and possible thoracentesis today  Acute respiratory failure with hypoxia Likely secondary to possible Pneumonia/atelectasis/effusions. Required intubation briefly from 12/21 to 12/22. Now on room air. Concern for possible OSA now using CPAP -Continue CPAP qhs  Primary malignant neuroendocrine pancreatic tumor Patient is s/p pancreaticoduodenectomy and hepaticojejunostomy.  -Per general surgery  Acute blood loss anemia Post op. Required 3 units of PRBC.  Leukocytosis In setting of recent surgery, possible pneumonia. Blood cultures with no growth to date. Patient was started on Vancomycin and Cefepime empirically and transitioned to Cefepime. CBC ordered but not obtained today -Continue Cefepime  Acute kidney injury Peak creatinine of 1.55. Baseline appears to be around 1. Resolved.  Hypomagnesemia Hypophosphatemia Per pharmacy while on TPN  Metabolic acidosis Resolved.  Prolonged QTc Last EKG with normal QTc  Morbid obesity Body mass index is 44.06 kg/m.  Anxiety Patient states she has panic attacks -Continue ativan prn  Abnormal LFTs Transient. Resolved.  Right foot drop   DVT prophylaxis: Lovenox Code Status:   Code Status: Full Code Family Communication: None Disposition Plan: Discharge per primary and continued medical improvement   Procedures:   s/p diagnostic laparoscopy, classic pancreatico duodenectomy, distal pancreatectomy, placement of biliary and pancreatic duct stents by [facility] on 12/15  Has 2 abdominal drains on the right and one on the left.  Right IJ central line and Foley catheter have been discontinued.  Antimicrobials:  Vancomycin  Cefepime     Subjective: Non-productive cough. Does not want to wear CPAP secondary to anxiety. States she has some tight breathing.  Objective: Vitals:  01/25/19 2235 01/25/19 2307 01/26/19 0123 01/26/19 0410  BP:  137/72  (!) 156/73  Pulse: (!) 109 (!) 108 (!) 107 (!) 106  Resp: 12 14 14 13   Temp:  97.8 F (36.6 C)  98.1 F (36.7 C)  TempSrc:  Oral  Oral  SpO2: 93% 96% 97% 97%  Weight:      Height:        Intake/Output Summary (Last 24 hours) at 01/26/2019 0944 Last data filed at 01/26/2019 0555 Gross per 24 hour  Intake 1038.6 ml  Output 1250 ml  Net -211.4 ml   Filed Weights   01/15/19 0612 01/21/19 1004 01/22/19 0318  Weight: 114.3 kg 117.3 kg 120.1 kg    Examination:  General exam: Appears calm and comfortable Respiratory system: Clear to auscultation but diminished. No wheezing heard today. Respiratory effort normal. Cardiovascular system: S1 & S2 heard, RRR. No murmurs, rubs, gallops or clicks. Gastrointestinal system: Abdomen is nondistended, soft and nontender. No organomegaly or masses felt. Normal bowel sounds heard. Central nervous system: Alert and oriented. No focal neurological deficits. Extremities: No edema. No calf tenderness Skin: No cyanosis. No rashes Psychiatry: Judgement and insight appear normal. Mood & affect appropriate.      Data Reviewed: I have personally reviewed following labs and imaging studies  CBC: Recent Labs  Lab 01/21/19 0250 01/21/19 0430 01/22/19 0414 01/23/19 0546 01/24/19 0200 01/25/19 0433  WBC 30.2*  --  25.1* 22.1* 20.7* 17.8*  NEUTROABS  --   --  18.7*  --   --   --   HGB 7.3* 6.8* 8.6* 8.7* 8.8* 8.9*  HCT 24.0* 20.0* 27.7* 28.2* 27.3* 27.9*  MCV 94.5  --  93.6 93.4 91.0 91.5  PLT 260  --  233 265 305 0000000   Basic Metabolic Panel: Recent Labs  Lab 01/21/19 0250 01/21/19 0430 01/22/19 0545 01/23/19 0546 01/24/19 0200 01/25/19 0433  NA 138 137 143 144 145 145  K 3.7 3.3* 4.0 3.6 3.7 3.9  CL 105  --  111 112*  114* 111  CO2 19*  --  21* 22 21* 23  GLUCOSE 151*  --  195* 270* 219* 160*  BUN 26*  --  23* 24* 23* 22*  CREATININE 1.07*  --  0.90 0.88 0.92 0.64  CALCIUM 7.9*  --  8.1* 8.3* 8.2* 8.2*  MG 2.2  --  2.3 2.1 2.1 1.9  PHOS 2.3*  --  2.5 1.4* 1.8* 2.7   GFR: Estimated Creatinine Clearance: 106.7 mL/min (by C-G formula based on SCr of 0.64 mg/dL). Liver Function Tests: Recent Labs  Lab 01/21/19 0250 01/22/19 0545 01/23/19 0546 01/24/19 0200 01/25/19 0433  AST 26 18 21 27 30   ALT 24 16 17 18 19   ALKPHOS 60 51 62 61 68  BILITOT 1.0 1.3* 1.1 0.9 0.5  PROT 6.7 6.0* 5.8* 5.8* 5.5*  ALBUMIN 3.8 3.5 2.7* 2.5* 2.2*   No results for input(s): LIPASE, AMYLASE in the last 168 hours. No results for input(s): AMMONIA in the last 168 hours. Coagulation Profile: No results for input(s): INR, PROTIME in the last 168 hours. Cardiac Enzymes: No results for input(s): CKTOTAL, CKMB, CKMBINDEX, TROPONINI in the last 168 hours. BNP (last 3 results) No results for input(s): PROBNP in the last 8760 hours. HbA1C: No results for input(s): HGBA1C in the last 72 hours. CBG: Recent Labs  Lab 01/25/19 1618 01/25/19 1955 01/25/19 2311 01/26/19 0437 01/26/19 0816  GLUCAP 158* 140* 156* 114* 134*   Lipid Profile:  No results for input(s): CHOL, HDL, LDLCALC, TRIG, CHOLHDL, LDLDIRECT in the last 72 hours. Thyroid Function Tests: No results for input(s): TSH, T4TOTAL, FREET4, T3FREE, THYROIDAB in the last 72 hours. Anemia Panel: No results for input(s): VITAMINB12, FOLATE, FERRITIN, TIBC, IRON, RETICCTPCT in the last 72 hours. Sepsis Labs: No results for input(s): PROCALCITON, LATICACIDVEN in the last 168 hours.  Recent Results (from the past 240 hour(s))  Culture, blood (routine x 2)     Status: None   Collection Time: 01/19/19 10:42 AM   Specimen: BLOOD RIGHT HAND  Result Value Ref Range Status   Specimen Description BLOOD RIGHT HAND  Final   Special Requests   Final    BOTTLES DRAWN  AEROBIC AND ANAEROBIC Blood Culture adequate volume   Culture   Final    NO GROWTH 5 DAYS Performed at [facility], [address], [place], [place] [address]    Report Status 01/24/2019 FINAL  Final  Culture, blood (routine x 2)     Status: None   Collection Time: 01/19/19 10:50 AM   Specimen: BLOOD  Result Value Ref Range Status   Specimen Description BLOOD BLOOD RIGHT WRIST  Final   Special Requests   Final    BOTTLES DRAWN AEROBIC AND ANAEROBIC Blood Culture adequate volume   Culture   Final    NO GROWTH 5 DAYS Performed at [facility], [place] [address], [place], [place] [address]    Report Status 01/24/2019 FINAL  Final  Culture, respiratory (non-expectorated)     Status: None   Collection Time: 01/21/19 10:32 AM   Specimen: Tracheal Aspirate; Respiratory  Result Value Ref Range Status   Specimen Description TRACHEAL ASPIRATE  Final   Special Requests NONE  Final   Gram Stain   Final    RARE WBC PRESENT, PREDOMINANTLY PMN RARE YEAST Performed at [facility], [place] [address] [place], [place] [address]    Culture FEW CANDIDA LUSITANIAE  Final   Report Status 01/24/2019 FINAL  Final  Culture, body fluid-bottle     Status: None   Collection Time: 01/21/19 10:39 AM   Specimen: Pleura  Result Value Ref Range Status   Specimen Description PLEURAL LEFT  Final   Special Requests NONE  Final   Culture   Final    NO GROWTH 5 DAYS Performed at [facility], [address], [place], [place] [address]    Report Status 01/26/2019 FINAL  Final  Gram stain     Status: None   Collection Time: 01/21/19 10:39 AM   Specimen: Pleura  Result Value Ref Range Status   Specimen Description PLEURAL LEFT  Final   Special Requests NONE  Final   Gram Stain   Final    FEW WBC PRESENT, PREDOMINANTLY PMN NO ORGANISMS SEEN Performed at [facility], [place] [address], [place], [place] [address]    Report Status 01/21/2019 FINAL  Final  Culture, respiratory  (non-expectorated)     Status: None   Collection Time: 01/22/19  9:45 AM   Specimen: Tracheal Aspirate; Respiratory  Result Value Ref Range Status   Specimen Description TRACHEAL ASPIRATE  Final   Special Requests NONE  Final   Gram Stain NO WBC SEEN NO ORGANISMS SEEN   Final   Culture   Final    Consistent with normal respiratory flora. Performed at [facility], [place] [address] [place], [place] [address]    Report Status 01/24/2019 FINAL  Final         Radiology Studies: DG CHEST PORT 1 VIEW  Result Date: 01/24/2019 CLINICAL DATA:  Wheezing EXAM: PORTABLE CHEST 1 VIEW COMPARISON:  Portable exam levin 34 hours compared to 01/23/2019 FINDINGS: Tip of RIGHT arm PICC line projects over SVC. Interval removal of nasogastric tube. Enlargement of cardiac silhouette. Mediastinal contours and pulmonary vascularity normal. Persistent mild RIGHT basilar atelectasis. Increased atelectasis versus consolidation of LEFT lower lobe. Associated LEFT pleural effusion, increased. Upper lungs clear. No pneumothorax. IMPRESSION: Increased atelectasis versus consolidation of LEFT lower lobe and LEFT pleural effusion. Subsegmental atelectasis RIGHT base. Electronically Signed   By: Lavonia Dana M.D.   On: 01/24/2019 11:57        Scheduled Meds: . sodium chloride   Intravenous Once  . Chlorhexidine Gluconate Cloth  6 each Topical Daily  . enoxaparin (LOVENOX) injection  40 mg Subcutaneous Q24H  . insulin aspart  0-20 Units Subcutaneous Q4H  . insulin glargine  20 Units Subcutaneous BID  . metoprolol tartrate  25 mg Per Tube BID  . octreotide  200 mcg Subcutaneous Q12H   Continuous Infusions: . sodium chloride 50 mL/hr at 01/26/19 0535  . ceFEPime (MAXIPIME) IV 2 g (01/26/19 0535)  . TPN ADULT (ION) 75 mL/hr at 01/25/19 1709     LOS: 11 days     Cordelia Poche, MD [facility] 01/26/2019, 9:44 AM  If 7PM-7AM, please contact night-coverage [email or web address]

## 2019-01-26 NOTE — Progress Notes (Signed)
Pt refuses CPAP for the night. States she doesn't use one.

## 2019-01-26 NOTE — Progress Notes (Addendum)
PHARMACY - TOTAL PARENTERAL NUTRITION CONSULT NOTE  Indication: Prolonged ileus  Patient Measurements: Height: 5\' 5"  (165.1 cm) Weight: 264 lb 12.4 oz (120.1 kg) IBW/kg (Calculated) : 57 TPN AdjBW (KG): 69.6 Body mass index is 44.06 kg/m. Usual Weight: ~113.6 kg  Assessment:  2 yof with a neuroendocrine tumor of the pancreas admitted for surgery and is now post-op Whipple procedure and distal pancreatectomy on 12/15. Pathology is pending. She was started on clear liquids 12/18 with fair appetite and advanced to regular diet 12/20 however had respiratory distress and hypoxia overnight 12/20 requiring re-intubation (extubated 12/22). CT abd/pelvis shows likely SB ileus.  Glucose / Insulin: hx DM with A1c 7.5%, on Janumet PTA.  CBGs improved, 114-156 since new TPN hung at 1800 PM.  Received 22 units SSI (11units since new TPN) + Lantus 20 units BID + 65 units regular insulin in TPN in last 24 hrs Electrolytes: K 3.9 (goal >= 4 for ileus),  Phos improved 2.7, Mg 1.9, others WNL - Labs pending/will replace outside TPN as needed.  Renal: SCr 0.64, BUN 22 LFTs / TGs: LFTs / tbili within normal limits, TG 256>241 (last 12/22) Prealbumin / albumin: albumin 2.7 Intake / Output; MIVF: drain O/P 831mL, NGT removed, UOP not all measured. Octreotide added.  GI Imaging: 12/20 CT abd/pelvis - distended gas filled loops of colon and SB likely due to ileus, small amount pelvic free fluid, probable mild hepatic steatosis Surgeries / Procedures:  12/15 Diagnostic laparoscopy, classic pancreaticoduodenectomy, distal pancreatectomy, placement of biliary and pancreatic duct stent  12/21 IR thoracentesis yielding 300 ml clear yellow fluid  Central access: double lumen PICC placed 01/21/19 TPN start date: 01/21/19  Nutritional Goals (per RD rec on 12/22): 2100-2300 kCal, 110-125g protein, ~2L fluid per day  Current Nutrition:  Clear liquids and TPN  Plan:  Increase TPN to goal rate of 90 ml/hr -  Increased lipids to help reduce CHO and improve CBGs TPN will provide 124g AA, 263g CHO and 71.4g ILE for a total of 2106 kCal, meeting ~100% of patient needs Electrolytes in TPN: will continue with reduced Na and increased Phos (potassium and magnesium will increase with increasing rate), max acetate  Add standard MVI on MWF and trace elements daily to TPN Continue resistant SSI Q4H + Lantus 20 units BID per MD + continue with regular insulin in TPN of 65 units (Pharmacy to adjust insulin in TPN per [facility])  Follow-up AM labs, CBGs, and TG Follow-up toleration of clears and ability to advance oral diet  Sloan Leiter, PharmD, BCPS, BCCCP Clinical Pharmacist Please refer to [facility] for [place] numbers 01/26/2019 7:19 AM   Addendum:  Labs reviewed- Na down to 136, this has been increased in TPN. Phos within normal limits and slightly reduced in TPN. K is elevated to 4.8 (up significantly from yesterday) - will discontinue current TPN bag and reduce in next TPN significantly which will hang at 1800 PM. Will repeat BMET in AM. Order updated and discussed with IV pharmacist and RN.   Sloan Leiter, PharmD, BCPS, BCCCP Clinical Pharmacist Please refer to [facility] for [place] numbers 01/26/2019, 1:58 PM

## 2019-01-27 ENCOUNTER — Other Ambulatory Visit: Payer: Self-pay

## 2019-01-27 ENCOUNTER — Inpatient Hospital Stay (HOSPITAL_COMMUNITY): Payer: BC Managed Care – PPO

## 2019-01-27 LAB — GLUCOSE, CAPILLARY
Glucose-Capillary: 125 mg/dL — ABNORMAL HIGH (ref 70–99)
Glucose-Capillary: 137 mg/dL — ABNORMAL HIGH (ref 70–99)
Glucose-Capillary: 151 mg/dL — ABNORMAL HIGH (ref 70–99)
Glucose-Capillary: 167 mg/dL — ABNORMAL HIGH (ref 70–99)
Glucose-Capillary: 172 mg/dL — ABNORMAL HIGH (ref 70–99)
Glucose-Capillary: 173 mg/dL — ABNORMAL HIGH (ref 70–99)

## 2019-01-27 LAB — GRAM STAIN

## 2019-01-27 LAB — BASIC METABOLIC PANEL
Anion gap: 10 (ref 5–15)
BUN: 20 mg/dL (ref 6–20)
CO2: 23 mmol/L (ref 22–32)
Calcium: 8.3 mg/dL — ABNORMAL LOW (ref 8.9–10.3)
Chloride: 104 mmol/L (ref 98–111)
Creatinine, Ser: 0.66 mg/dL (ref 0.44–1.00)
GFR calc Af Amer: >60 mL/min (ref >60)
GFR calc non Af Amer: >60 mL/min (ref >60)
Glucose, Bld: 151 mg/dL — ABNORMAL HIGH (ref 70–99)
Potassium: 4.5 mmol/L (ref 3.5–5.1)
Sodium: 137 mmol/L (ref 135–145)

## 2019-01-27 LAB — CBC
HCT: 29.6 % — ABNORMAL LOW (ref 36.0–46.0)
Hemoglobin: 9.2 g/dL — ABNORMAL LOW (ref 12.0–15.0)
MCH: 29.2 pg (ref 26.0–34.0)
MCHC: 31.1 g/dL (ref 30.0–36.0)
MCV: 94 fL (ref 80.0–100.0)
Platelets: 331 10*3/uL (ref 150–400)
RBC: 3.15 MIL/uL — ABNORMAL LOW (ref 3.87–5.11)
RDW: 17 % — ABNORMAL HIGH (ref 11.5–15.5)
WBC: 17.3 10*3/uL — ABNORMAL HIGH (ref 4.0–10.5)
nRBC: 0.3 % — ABNORMAL HIGH (ref 0.0–0.2)

## 2019-01-27 LAB — MAGNESIUM: Magnesium: 2 mg/dL (ref 1.7–2.4)

## 2019-01-27 LAB — PHOSPHORUS: Phosphorus: 3.4 mg/dL (ref 2.5–4.6)

## 2019-01-27 MED ORDER — TRAVASOL 10 % IV SOLN
INTRAVENOUS | Status: AC
  Filled 2019-01-27: qty 1242

## 2019-01-27 MED ORDER — LIDOCAINE HCL (PF) 1 % IJ SOLN
INTRAMUSCULAR | Status: AC
  Filled 2019-01-27: qty 30

## 2019-01-27 NOTE — Progress Notes (Signed)
PHARMACY - TOTAL PARENTERAL NUTRITION CONSULT NOTE  Indication: Prolonged ileus  Patient Measurements: Height: 5\' 5"  (165.1 cm) Weight: 264 lb 12.4 oz (120.1 kg) IBW/kg (Calculated) : 57 TPN AdjBW (KG): 69.6 Body mass index is 44.06 kg/m. Usual Weight: ~113.6 kg  Assessment:  57 yof with a neuroendocrine tumor of the pancreas admitted for surgery and is now post-op Whipple procedure and distal pancreatectomy on 12/15. Pathology is pending. She was started on clear liquids 12/18 with fair appetite and advanced to regular diet 12/20 however had respiratory distress and hypoxia overnight 12/20 requiring re-intubation (extubated 12/22). CT abd/pelvis shows likely SB ileus.  Glucose / Insulin: hx DM with A1c 7.5%, on Janumet PTA.  CBGs well controlled, 106-151 on SSI + Lantus 20 units BID (*PM Lantus dose was held 12/27) + 65 units of regular insulin in TPN.   Received 14 units SSI in last 24 hours (only 4 units since new TPN bag hung at 1800PM).  Electrolytes: K 4.5-down after reduction in TPN (goal >= 4 for ileus), others within normal limits.  Renal: SCr 0.66, BUN 20 LFTs / TGs: LFTs / tbili within normal limits, TG 256>241 (last 12/22) Prealbumin / albumin: albumin 2.7 Intake / Output; MIVF: drain O/P 790 mL, NGT removed, UOP not all measured. Octreotide added 12/25.   GI Imaging: 12/20 CT abd/pelvis - distended gas filled loops of colon and SB likely due to ileus, small amount pelvic free fluid, probable mild hepatic steatosis Surgeries / Procedures:  12/15 Diagnostic laparoscopy, classic pancreaticoduodenectomy, distal pancreatectomy, placement of biliary and pancreatic duct stent  12/21 IR thoracentesis yielding 300 ml clear yellow fluid  Central access: double lumen PICC placed 01/21/19 TPN start date: 01/21/19  Nutritional Goals (per RD rec on 12/22): 2100-2300 kCal, 110-125g protein, ~2L fluid per day  Current Nutrition:  Clear liquids and TPN  Plan:  Continue TPN at goal  rate of 90 ml/hr - Increased lipids 12/26 to help reduce CHO and improve CBGs TPN will provide 124g AA, 263g CHO and 71.4g ILE for a total of 2106 kCal, meeting ~100% of patient needs Electrolytes in TPN: continue with reduced Na and increased Phos and slightly increased Mag, standard potassium, max acetate  Add standard MVI on MWF and trace elements daily to TPN Continue resistant SSI Q4H + Lantus 20 units BID per MD + reduce regular insulin in TPN to 55 units (Pharmacy to adjust insulin in TPN per TRH)  Follow-up TPN labs Follow-up toleration of clears and ability to advance oral diet  Sloan Leiter, PharmD, BCPS, BCCCP Clinical Pharmacist Please refer to [facility] for [place] numbers 01/27/2019 8:32 AM

## 2019-01-27 NOTE — Plan of Care (Signed)
  Problem: Education: Goal: Will demonstrate proper wound care and an understanding of methods to prevent future damage Outcome: Progressing   Problem: Bowel/Gastric: Goal: Gastrointestinal status for postoperative course will improve Outcome: Progressing   Problem: Nutritional: Goal: Will attain and maintain optimal nutritional status will improve Outcome: Progressing   Problem: Clinical Measurements: Goal: Postoperative complications will be avoided or minimized Outcome: Progressing   Problem: Respiratory: Goal: Will regain and/or maintain adequate ventilation Description: Utilize IS to goal of 1750 by 01/24/19 q hour x 10 w/a. Monitor for s/s of pneumonia (PCXR = LLL pneumonia 01/19/19) Outcome: Progressing   Problem: Skin Integrity: Goal: Wound healing without signs and symptoms of infection will improve Outcome: Progressing Goal: Risk for impaired skin integrity will decrease Outcome: Progressing   Problem: Tissue Perfusion: Goal: Risk of venous thrombosis will decrease Outcome: Progressing   Problem: Urinary Elimination: Goal: Ability to achieve and maintain adequate urine output will improve Outcome: Progressing   Problem: Education: Goal: Knowledge of General Education information will improve Description: Including pain rating scale, medication(s)/side effects and non-pharmacologic comfort measures Outcome: Progressing   Problem: Health Behavior/Discharge Planning: Goal: Ability to manage health-related needs will improve Outcome: Progressing   Problem: Clinical Measurements: Goal: Ability to maintain clinical measurements within normal limits will improve Outcome: Progressing Goal: Will remain free from infection Outcome: Progressing Goal: Diagnostic test results will improve Outcome: Progressing Goal: Respiratory complications will improve Outcome: Progressing Goal: Cardiovascular complication will be avoided Outcome: Progressing   Problem:  Activity: Goal: Risk for activity intolerance will decrease Outcome: Progressing   Problem: Nutrition: Goal: Adequate nutrition will be maintained Outcome: Progressing   Problem: Coping: Goal: Level of anxiety will decrease Outcome: Progressing   Problem: Elimination: Goal: Will not experience complications related to bowel motility Outcome: Progressing Goal: Will not experience complications related to urinary retention Outcome: Progressing   Problem: Pain Managment: Goal: General experience of comfort will improve Outcome: Progressing   Problem: Safety: Goal: Ability to remain free from injury will improve Outcome: Progressing   Problem: Skin Integrity: Goal: Risk for impaired skin integrity will decrease Outcome: Progressing

## 2019-01-27 NOTE — Progress Notes (Signed)
PROGRESS NOTE    Leah Farmer  [id number] DOB: 1966-01-09 DOA: 01/15/2019 PCP: Margy Clarks, NP   Brief Narrative: Leah Farmer is a 53 y.o. female, PMH of type II DM, HTN, GERD, anxiety and depression, NAFL, primary malignant neuroendocrine tumor of the pancreas x2, s/p diagnostic laparoscopy, classic pancreatico duodenectomy, distal pancreatectomy, placement of biliary and pancreatic duct stents by [facility] on 12/15, TRH was consulted on 12/16 to assist with management of uncontrolled DM and pneumonia.  Postop course complicated by postop ileus, sinus tachycardia, expected acute blood loss anemia and electrolyte abnormalities in addition to respiratory failure requiring short term intubation.    Assessment & Plan:   Principal Problem:   Primary malignant neuroendocrine neoplasm of pancreas [facility]) Active Problems:   Pancreatic tumor   Diabetes mellitus type 2 in obese ([place])   HCAP (healthcare-associated pneumonia)   Essential hypertension   Depression   Pleural effusion, left   Acute respiratory failure with hypoxia (HCC)   OSA (obstructive sleep apnea)   Diabetes mellitus, type 2, uncontrolled with hyperglycemia Patient's A1C is 7.5% from 12/11. On Janumet XR as an outpatient. No worsened in setting of pancreatectomy and TPN. On Lantus 20 units BID, SSI and insulin in TPN. Blood sugar much better controlled. Patient receiving 65 units of regular insulin in TPN. Lantus held last night for some reason -Continue Lantus 20 units BID and SSI -Pharmacy to adjust inulin in TPN; will likely start needing less  Essential hypertension -Continue metoprolol  Wheezing No history of reactive airway/asthma/COPD. Wheezing resolved -Continue Xopenex  Sinus tachycardia Persistent. Patient is on metoprolol. Likely multifactorial. Patient states she has a history of this chronically. -Can discontinue telemetry from this standpoint as needed  Possible pneumonia Chest x-ray  concerning for possible pneumonia vs atelectasis. Associated leukocytosis in setting of recent surgery in addition to acute respiratory failure requiring intubation. Now extubated. Repeat chest x-ray showing re-accumulating pleural effusion. Completed 8 days of cefepime. -Continue incentive spirometer -Thoracentesis today with culture and cytology  Acute respiratory failure with hypoxia Likely secondary to possible Pneumonia/atelectasis/effusions. Required intubation briefly from 12/21 to 12/22. Now on room air. Concern for possible OSA now using CPAP -Continue CPAP qhs  Primary malignant neuroendocrine pancreatic tumor Patient is s/p pancreaticoduodenectomy and hepaticojejunostomy.  -Per general surgery  Acute blood loss anemia Post op. Required 3 units of PRBC. Stable.  Leukocytosis In setting of recent surgery, possible pneumonia. Blood cultures with no growth to date. Patient was started on Vancomycin and Cefepime empirically and transitioned to Cefepime. CBC stabilized but still elevated.  Acute kidney injury Peak creatinine of 1.55. Baseline appears to be around 1. Resolved.  Hypomagnesemia Hypophosphatemia Per pharmacy while on TPN  Metabolic acidosis Resolved.  Prolonged QTc Last EKG with normal QTc  Morbid obesity Body mass index is 44.06 kg/m.  Anxiety Patient states she has panic attacks -Continue ativan prn  Abnormal LFTs Transient. Resolved.  Right foot drop Brace per primary   DVT prophylaxis: Lovenox Code Status:   Code Status: Full Code Family Communication: None Disposition Plan: Discharge per primary and continued medical improvement   Procedures:   s/p diagnostic laparoscopy, classic pancreatico duodenectomy, distal pancreatectomy, placement of biliary and pancreatic duct stents by [facility] on 12/15  Has 2 abdominal drains on the right and one on the left.  Right IJ central line and Foley catheter have been  discontinued.  Antimicrobials:  Vancomycin  Cefepime    Subjective: Non-productive cough. Does not want to wear CPAP. Wheezing improved.  Objective: Vitals:  01/26/19 1944 01/27/19 0022 01/27/19 0344 01/27/19 0759  BP: (!) 118/54 111/72 139/77 135/62  Pulse: (!) 106 95 94 96  Resp: 19 13 17 18   Temp: 98.1 F (36.7 C) 98.5 F (36.9 C) 99.1 F (37.3 C) 98.9 F (37.2 C)  TempSrc: Oral Oral Oral Oral  SpO2: 97% 97% 96% 98%  Weight:      Height:        Intake/Output Summary (Last 24 hours) at 01/27/2019 0952 Last data filed at 01/27/2019 0554 Gross per 24 hour  Intake 1865 ml  Output 1140 ml  Net 725 ml   Filed Weights   01/15/19 0612 01/21/19 1004 01/22/19 0318  Weight: 114.3 kg 117.3 kg 120.1 kg    Examination:  General exam: Appears calm and comfortable Respiratory system: Diminished bilaterally with mild rales. Respiratory effort normal. Cardiovascular system: S1 & S2 heard, RRR. No murmurs, rubs, gallops or clicks. Gastrointestinal system: Abdomen is nondistended, soft and nontender. Normal bowel sounds heard. Central nervous system: Alert and oriented. No focal neurological deficits. Extremities: No edema. No calf tenderness Skin: No cyanosis. No rashes Psychiatry: Judgement and insight appear normal. Mood & affect appropriate.       Data Reviewed: I have personally reviewed following labs and imaging studies  CBC: Recent Labs  Lab 01/22/19 0414 01/23/19 0546 01/24/19 0200 01/25/19 0433 01/26/19 1159 01/27/19 0742  WBC 25.1* 22.1* 20.7* 17.8* 20.7* 17.3*  NEUTROABS 18.7*  --   --   --   --   --   HGB 8.6* 8.7* 8.8* 8.9* 9.7* 9.2*  HCT 27.7* 28.2* 27.3* 27.9* 31.7* 29.6*  MCV 93.6 93.4 91.0 91.5 93.5 94.0  PLT 233 265 305 296 308 [id number]   Basic Metabolic Panel: Recent Labs  Lab 01/23/19 0546 01/24/19 0200 01/25/19 0433 01/26/19 1159 01/27/19 0742  NA 144 145 145 136 137  K 3.6 3.7 3.9 4.8 4.5  CL 112* 114* 111 104 104  CO2 22 21* 23 24  23   GLUCOSE 270* 219* 160* 189* 151*  BUN 24* 23* 22* 21* 20  CREATININE 0.88 0.92 0.64 0.69 0.66  CALCIUM 8.3* 8.2* 8.2* 8.1* 8.3*  MG 2.1 2.1 1.9 2.1 2.0  PHOS 1.4* 1.8* 2.7 3.5 3.4   GFR: Estimated Creatinine Clearance: 106.7 mL/min (by C-G formula based on SCr of 0.66 mg/dL). Liver Function Tests: Recent Labs  Lab 01/21/19 0250 01/22/19 0545 01/23/19 0546 01/24/19 0200 01/25/19 0433  AST 26 18 21 27 30   ALT 24 16 17 18 19   ALKPHOS 60 51 62 61 68  BILITOT 1.0 1.3* 1.1 0.9 0.5  PROT 6.7 6.0* 5.8* 5.8* 5.5*  ALBUMIN 3.8 3.5 2.7* 2.5* 2.2*   No results for input(s): LIPASE, AMYLASE in the last 168 hours. No results for input(s): AMMONIA in the last 168 hours. Coagulation Profile: No results for input(s): INR, PROTIME in the last 168 hours. Cardiac Enzymes: No results for input(s): CKTOTAL, CKMB, CKMBINDEX, TROPONINI in the last 168 hours. BNP (last 3 results) No results for input(s): PROBNP in the last 8760 hours. HbA1C: No results for input(s): HGBA1C in the last 72 hours. CBG: Recent Labs  Lab 01/26/19 1120 01/26/19 1658 01/26/19 1943 01/27/19 0347 01/27/19 0828  GLUCAP 169* 124* 106* 151* 137*   Lipid Profile: No results for input(s): CHOL, HDL, LDLCALC, TRIG, CHOLHDL, LDLDIRECT in the last 72 hours. Thyroid Function Tests: No results for input(s): TSH, T4TOTAL, FREET4, T3FREE, THYROIDAB in the last 72 hours. Anemia Panel: No results for input(s): VITAMINB12,  FOLATE, FERRITIN, TIBC, IRON, RETICCTPCT in the last 72 hours. Sepsis Labs: No results for input(s): PROCALCITON, LATICACIDVEN in the last 168 hours.  Recent Results (from the past 240 hour(s))  Culture, blood (routine x 2)     Status: None   Collection Time: 01/19/19 10:42 AM   Specimen: BLOOD RIGHT HAND  Result Value Ref Range Status   Specimen Description BLOOD RIGHT HAND  Final   Special Requests   Final    BOTTLES DRAWN AEROBIC AND ANAEROBIC Blood Culture adequate volume   Culture   Final     NO GROWTH 5 DAYS Performed at [facility], [address] [place], [place] [address]    Report Status 01/24/2019 FINAL  Final  Culture, blood (routine x 2)     Status: None   Collection Time: 01/19/19 10:50 AM   Specimen: BLOOD  Result Value Ref Range Status   Specimen Description BLOOD BLOOD RIGHT WRIST  Final   Special Requests   Final    BOTTLES DRAWN AEROBIC AND ANAEROBIC Blood Culture adequate volume   Culture   Final    NO GROWTH 5 DAYS Performed at [facility], [place] [address] [place], [place] [address]    Report Status 01/24/2019 FINAL  Final  Culture, respiratory (non-expectorated)     Status: None   Collection Time: 01/21/19 10:32 AM   Specimen: Tracheal Aspirate; Respiratory  Result Value Ref Range Status   Specimen Description TRACHEAL ASPIRATE  Final   Special Requests NONE  Final   Gram Stain   Final    RARE WBC PRESENT, PREDOMINANTLY PMN RARE YEAST Performed at [facility], [place] [address], [place], [place] [address]    Culture FEW CANDIDA LUSITANIAE  Final   Report Status 01/24/2019 FINAL  Final  Culture, body fluid-bottle     Status: None   Collection Time: 01/21/19 10:39 AM   Specimen: Pleura  Result Value Ref Range Status   Specimen Description PLEURAL LEFT  Final   Special Requests NONE  Final   Culture   Final    NO GROWTH 5 DAYS Performed at [facility], [address] [place], [place] [address]    Report Status 01/26/2019 FINAL  Final  Gram stain     Status: None   Collection Time: 01/21/19 10:39 AM   Specimen: Pleura  Result Value Ref Range Status   Specimen Description PLEURAL LEFT  Final   Special Requests NONE  Final   Gram Stain   Final    FEW WBC PRESENT, PREDOMINANTLY PMN NO ORGANISMS SEEN Performed at [facility], [place] [address] [place], [place] [address]    Report Status 01/21/2019 FINAL  Final  Culture, respiratory (non-expectorated)     Status: None   Collection Time: 01/22/19  9:45 AM    Specimen: Tracheal Aspirate; Respiratory  Result Value Ref Range Status   Specimen Description TRACHEAL ASPIRATE  Final   Special Requests NONE  Final   Gram Stain NO WBC SEEN NO ORGANISMS SEEN   Final   Culture   Final    Consistent with normal respiratory flora. Performed at [facility], [place] [address], [place], [place] [address]    Report Status 01/24/2019 FINAL  Final         Radiology Studies: DG CHEST PORT 1 VIEW  Result Date: 01/26/2019 CLINICAL DATA:  Pleural effusion and cough. EXAM: PORTABLE CHEST 1 VIEW COMPARISON:  01/24/2019 FINDINGS: Right-sided PICC  line unchanged. Lungs are hypoinflated with stable moderate opacification over the left mid to lower lung likely moderate size effusion with associated basilar atelectasis. Infection in the left mid to lower lung is possible. Mild prominence of the perihilar markings which may be due to mild vascular congestion. Stable cardiomegaly. Remainder of the exam is unchanged. IMPRESSION: 1. Stable opacification over the left mid to lower lung likely effusion with atelectasis, although infection in the left base is possible. 2.  Cardiomegaly and suggestion mild vascular congestion. Electronically Signed   By: Marin Olp M.D.   On: 01/26/2019 10:18        Scheduled Meds: . sodium chloride   Intravenous Once  . Chlorhexidine Gluconate Cloth  6 each Topical Daily  . enoxaparin (LOVENOX) injection  40 mg Subcutaneous Q24H  . insulin aspart  0-20 Units Subcutaneous Q4H  . insulin glargine  20 Units Subcutaneous BID  . metoprolol tartrate  25 mg Per Tube BID  . octreotide  200 mcg Subcutaneous Q12H   Continuous Infusions: . sodium chloride 50 mL/hr at 01/27/19 0542  . ceFEPime (MAXIPIME) IV 2 g (01/27/19 0544)  . TPN ADULT (ION) 90 mL/hr at 01/26/19 1806  . TPN ADULT (ION)       LOS: 12 days     Cordelia Poche, MD [facility] 01/27/2019, 9:52 AM  If 7PM-7AM, please contact night-coverage [email or web address]

## 2019-01-27 NOTE — Progress Notes (Signed)
12 Days Post-Op   Subjective/Chief Complaint: Pt feeling better +BMs Tol Clears   Objective: Vital signs in last 24 hours: Temp:  [98.1 F (36.7 C)-99.1 F (37.3 C)] 98.9 F (37.2 C) (12/27 0759) Pulse Rate:  [94-106] 96 (12/27 0759) Resp:  [13-20] 18 (12/27 0759) BP: (111-163)/(54-79) 135/62 (12/27 0759) SpO2:  [96 %-98 %] 98 % (12/27 0759) Last BM Date: 01/26/19  Intake/Output from previous day: 12/26 0701 - 12/27 0700 In: 1865 [P.O.:1320; I.V.:545] Out: 1140 [Urine:350; Drains:790] Intake/Output this shift: No intake/output data recorded.  PE: General appearance:alert, cooperative and no distress Resp:bibasilar crackles [id number], incisional tenderness; minimal bowel sounds; drains with cloudy output Incisions c/d/i; drains with serous output  Lab Results:  Recent Labs    01/26/19 1159 01/27/19 0742  WBC 20.7* 17.3*  HGB 9.7* 9.2*  HCT 31.7* 29.6*  PLT 308 331   BMET Recent Labs    01/26/19 1159 01/27/19 0742  NA 136 137  K 4.8 4.5  CL 104 104  CO2 24 23  GLUCOSE 189* 151*  BUN 21* 20  CREATININE 0.69 0.66  CALCIUM 8.1* 8.3*   PT/INR No results for input(s): LABPROT, INR in the last 72 hours. ABG No results for input(s): PHART, HCO3 in the last 72 hours.  Invalid input(s): PCO2, PO2  Studies/Results: DG CHEST PORT 1 VIEW  Result Date: 01/26/2019 CLINICAL DATA:  Pleural effusion and cough. EXAM: PORTABLE CHEST 1 VIEW COMPARISON:  01/24/2019 FINDINGS: Right-sided PICC line unchanged. Lungs are hypoinflated with stable moderate opacification over the left mid to lower lung likely moderate size effusion with associated basilar atelectasis. Infection in the left mid to lower lung is possible. Mild prominence of the perihilar markings which may be due to mild vascular congestion. Stable cardiomegaly. Remainder of the exam is unchanged. IMPRESSION: 1. Stable opacification over the left mid to lower lung likely effusion with atelectasis, although  infection in the left base is possible. 2.  Cardiomegaly and suggestion mild vascular congestion. Electronically Signed   By: Marin Olp M.D.   On: 01/26/2019 10:18    Anti-infectives: Anti-infectives (From admission, onward)   Start     Dose/Rate Route Frequency Ordered Stop   01/19/19 1430  vancomycin (VANCOREADY) IVPB 1500 mg/300 mL  Status:  Discontinued     1,500 mg 150 mL/hr over 120 Minutes Intravenous Every 24 hours 01/19/19 1351 01/22/19 0952   01/19/19 1400  ceFEPIme (MAXIPIME) 2 g in sodium chloride 0.9 % 100 mL IVPB     2 g 200 mL/hr over 30 Minutes Intravenous Every 8 hours 01/19/19 1349     01/15/19 2000  ceFAZolin (ANCEF) IVPB 2g/100 mL premix     2 g 200 mL/hr over 30 Minutes Intravenous Every 8 hours 01/15/19 1712 01/15/19 2052   01/15/19 0600  ceFAZolin (ANCEF) IVPB 2g/100 mL premix     2 g 200 mL/hr over 30 Minutes Intravenous On call to O.R. 01/15/19 0549 01/15/19 1145      Assessment/Plan: S/p Whipple- 12/15 Dr. Barry Dienes. Drainswith highamylasex2, but will leave all three in place (360,350,80cc o/p)  Will add Octreotide to see if this slows down drain output ABL anemia-Hgb stable Pancreatic leak/drain amylase up - drain output has increased; was started on octreotide  DM- On resistant SSI and glargine. TRH following - appreciate assistance. Leukocytosis/ID-afebrile, WBC down to 17today.CXR suspicious for pneumoniaandbibasilar atelectasis.Vanc/cefepime started12/20. IR thoracentesis12/21, cx NGTD, but pleural fluid c/w exudative effusion.  Plan for Thora today.BloodNeg. Reintubated12/20, resp cx 12/21 re-incubated.CT C/A/P12/20 with peri-pancreatic  3.6 x 7.6 x 4.6cm fluid collection is not formed and no gas present. Vent dependent respiratory failure-extubated F/E/N- ileus appears to be resolved; onTPN, start bariatric fulls(since we don't have diabetic fulls as a diet option here)  Acidosis- resolved AKI- resolved Elevated LFTs-  resolved R foot drop s/p epidural-Will order foot brace to help with ambulation   LOS: 12 days    Ralene Ok 01/27/2019

## 2019-01-27 NOTE — Progress Notes (Signed)
Pt ref CPAP

## 2019-01-27 NOTE — Procedures (Signed)
PROCEDURE SUMMARY:  Successful image-guided left thoracentesis. Yielded 300 milliliters of clear yellow fluid. Patient tolerated procedure well. EBL: Zero No immediate complications.  Specimen was sent for labs. Post procedure CXR shows no pneumothorax.  Please see imaging section of Epic for full dictation.  Joaquim Nam PA-C 01/27/2019 11:13 AM

## 2019-01-28 LAB — GLUCOSE, CAPILLARY
Glucose-Capillary: 146 mg/dL — ABNORMAL HIGH (ref 70–99)
Glucose-Capillary: 155 mg/dL — ABNORMAL HIGH (ref 70–99)
Glucose-Capillary: 158 mg/dL — ABNORMAL HIGH (ref 70–99)
Glucose-Capillary: 167 mg/dL — ABNORMAL HIGH (ref 70–99)
Glucose-Capillary: 171 mg/dL — ABNORMAL HIGH (ref 70–99)
Glucose-Capillary: 178 mg/dL — ABNORMAL HIGH (ref 70–99)

## 2019-01-28 LAB — BASIC METABOLIC PANEL
Anion gap: 12 (ref 5–15)
BUN: 22 mg/dL — ABNORMAL HIGH (ref 6–20)
CO2: 23 mmol/L (ref 22–32)
Calcium: 8.3 mg/dL — ABNORMAL LOW (ref 8.9–10.3)
Chloride: 100 mmol/L (ref 98–111)
Creatinine, Ser: 0.67 mg/dL (ref 0.44–1.00)
GFR calc Af Amer: >60 mL/min (ref >60)
GFR calc non Af Amer: >60 mL/min (ref >60)
Glucose, Bld: 180 mg/dL — ABNORMAL HIGH (ref 70–99)
Potassium: 4.5 mmol/L (ref 3.5–5.1)
Sodium: 135 mmol/L (ref 135–145)

## 2019-01-28 LAB — DIFFERENTIAL
Abs Immature Granulocytes: 0.8 10*3/uL — ABNORMAL HIGH (ref 0.00–0.07)
Basophils Absolute: 0 10*3/uL (ref 0.0–0.1)
Basophils Relative: 0 %
Eosinophils Absolute: 0.4 10*3/uL (ref 0.0–0.5)
Eosinophils Relative: 3 %
Lymphocytes Relative: 17 %
Lymphs Abs: 2.3 10*3/uL (ref 0.7–4.0)
Metamyelocytes Relative: 2 %
Monocytes Absolute: 0.9 10*3/uL (ref 0.1–1.0)
Monocytes Relative: 7 %
Neutro Abs: 9 10*3/uL — ABNORMAL HIGH (ref 1.7–7.7)
Neutrophils Relative %: 67 %
Promyelocytes Relative: 4 %
nRBC: 0 /100 WBC

## 2019-01-28 LAB — CBC
HCT: 30.4 % — ABNORMAL LOW (ref 36.0–46.0)
Hemoglobin: 9.4 g/dL — ABNORMAL LOW (ref 12.0–15.0)
MCH: 28.8 pg (ref 26.0–34.0)
MCHC: 30.9 g/dL (ref 30.0–36.0)
MCV: 93.3 fL (ref 80.0–100.0)
Platelets: 356 10*3/uL (ref 150–400)
RBC: 3.26 MIL/uL — ABNORMAL LOW (ref 3.87–5.11)
RDW: 16.4 % — ABNORMAL HIGH (ref 11.5–15.5)
WBC: 13.5 10*3/uL — ABNORMAL HIGH (ref 4.0–10.5)
nRBC: 0.1 % (ref 0.0–0.2)

## 2019-01-28 LAB — BODY FLUID CELL COUNT WITH DIFFERENTIAL
Eos, Fluid: 2 %
Lymphs, Fluid: 26 %
Monocyte-Macrophage-Serous Fluid: 38 % — ABNORMAL LOW (ref 50–90)
Neutrophil Count, Fluid: 34 % — ABNORMAL HIGH (ref 0–25)
Other Cells, Fluid: 0 %
Total Nucleated Cell Count, Fluid: 775 cu mm (ref 0–1000)

## 2019-01-28 LAB — PREALBUMIN: Prealbumin: 14.1 mg/dL — ABNORMAL LOW (ref 18–38)

## 2019-01-28 LAB — MAGNESIUM: Magnesium: 2 mg/dL (ref 1.7–2.4)

## 2019-01-28 LAB — PHOSPHORUS: Phosphorus: 3.2 mg/dL (ref 2.5–4.6)

## 2019-01-28 MED ORDER — LISINOPRIL-HYDROCHLOROTHIAZIDE 20-12.5 MG PO TABS: 1.0000 | ORAL_TABLET | Freq: Every day | ORAL | Status: DC

## 2019-01-28 MED ORDER — TRAMADOL HCL 50 MG PO TABS: 50.0000 mg | ORAL_TABLET | Freq: Two times a day (BID) | ORAL | Status: DC | PRN

## 2019-01-28 MED ORDER — PANTOPRAZOLE SODIUM 40 MG PO TBEC
40.0000 mg | DELAYED_RELEASE_TABLET | Freq: Every day | ORAL | Status: DC
  Administered 2019-01-28 – 2019-01-30 (×3): 40 mg via ORAL
  Filled 2019-01-28 (×3): qty 1

## 2019-01-28 MED ORDER — NORGESTREL-ETHINYL ESTRADIOL 0.3-30 MG-MCG PO TABS: 1.0000 | ORAL_TABLET | Freq: Every day | ORAL | Status: DC

## 2019-01-28 MED ORDER — GLUCERNA SHAKE PO LIQD
237.0000 mL | Freq: Three times a day (TID) | ORAL | Status: DC
  Administered 2019-01-28: 237 mL via ORAL

## 2019-01-28 MED ORDER — HYDROCHLOROTHIAZIDE 12.5 MG PO CAPS
12.5000 mg | ORAL_CAPSULE | Freq: Every day | ORAL | Status: DC
  Administered 2019-01-28 – 2019-01-31 (×4): 12.5 mg via ORAL
  Filled 2019-01-28 (×4): qty 1

## 2019-01-28 MED ORDER — TRAVASOL 10 % IV SOLN
INTRAVENOUS | Status: AC
  Filled 2019-01-28: qty 1242

## 2019-01-28 MED ORDER — OXYCODONE HCL 5 MG PO TABS
5.0000 mg | ORAL_TABLET | ORAL | Status: DC | PRN
  Administered 2019-01-28: 10 mg via ORAL
  Administered 2019-01-29 – 2019-01-31 (×5): 5 mg via ORAL
  Filled 2019-01-28 (×3): qty 1
  Filled 2019-01-28: qty 2
  Filled 2019-01-28 (×2): qty 1

## 2019-01-28 MED ORDER — LISINOPRIL 20 MG PO TABS
20.0000 mg | ORAL_TABLET | Freq: Every day | ORAL | Status: DC
  Administered 2019-01-28 – 2019-01-31 (×4): 20 mg via ORAL
  Filled 2019-01-28 (×4): qty 1

## 2019-01-28 NOTE — Progress Notes (Signed)
Physical Therapy Treatment Patient Details Name: Leah Farmer MRN: [id number] DOB: 09-Jan-1966 Today's Date: 01/28/2019    History of Present Illness pt is a 53 y/o female with pmh significant for HTN, arthritis, DM admitted with primary malignant pancreatic tumors x2, s/p whipple procedure.    PT Comments    Pt in good spirits, smiling and joking with husband.  Pt wanting to get OOB.  Emphasis on transition to EOB, sit to stand and progressing gait quality.  Pt still with R steppage gait for foot drop, but pt with no R ankle rolling   Follow Up Recommendations  CIR;Supervision for mobility/OOB;Other (comment)(PT AND HUSBAND WISH TO GO STRAIGHT HOME )     Equipment Recommendations  Rolling walker with 5" wheels;3in1 (PT);Other (comment)(w/c would be beneficial to get to MD appointments)    Recommendations for Other Services       Precautions / Restrictions Precautions Precautions: Fall Precaution Comments: 3 drains    Mobility  Bed Mobility Overal bed mobility: Needs Assistance Bed Mobility: Rolling;Sidelying to Sit Rolling: Min assist Sidelying to sit: Min assist       General bed mobility comments: lesser assist to roll and transition due to less abdominal pain.  Transfers Overall transfer level: Needs assistance Equipment used: Rolling walker (2 wheeled);None Transfers: Sit to/from Stand Sit to Stand: Min assist;+2 safety/equipment         General transfer comment: cues for hand placement and assist to come forward more than boost.  Ambulation/Gait Ambulation/Gait assistance: Min assist;+2 safety/equipment Gait Distance (Feet): 38 Feet(then after rest additional 32 feet and 20 feet.) Assistive device: Rolling walker (2 wheeled) Gait Pattern/deviations: Step-through pattern Gait velocity: slow   General Gait Details: L steppage gait, reports of dizziness and after 3rd trial feeling lightheaded.   Stairs             Wheelchair Mobility     Modified Rankin (Stroke Patients Only)       Balance Overall balance assessment: Needs assistance Sitting-balance support: Feet supported;No upper extremity supported Sitting balance-[name]: Fair     Standing balance support: Bilateral upper extremity supported Standing balance-[name]: Poor Standing balance comment: reliant on bil UE support in standing                            Cognition Arousal/Alertness: Awake/alert Behavior During Therapy: WFL for tasks assessed/performed Overall Cognitive Status: Within Functional Limits for tasks assessed                         Following Commands: Follows one step commands consistently              Exercises      General Comments General comments (skin integrity, edema, etc.): vitals Stable, HR 109 bpm, sats mid 90's      Pertinent Vitals/Pain Pain Assessment: Faces Faces Pain Scale: Hurts little more Pain Location: abdominal incisions Pain Descriptors / Indicators: Discomfort;Grimacing;Guarding Pain Intervention(s): Monitored during session    Home Living                      Prior Function            PT Goals (current goals can now be found in the care plan section) Acute Rehab PT Goals Patient Stated Goal: able to get home and into my chair for Christmas. PT Goal Formulation: With patient Time For Goal Achievement: 01/31/19 Potential to  Achieve Goals: Good Progress towards PT goals: Progressing toward goals    Frequency    Min 3X/week      PT Plan Current plan remains appropriate    Co-evaluation              AM-PAC PT "6 Clicks" Mobility   Outcome Measure  Help needed turning from your back to your side while in a flat bed without using bedrails?: A Little Help needed moving from lying on your back to sitting on the side of a flat bed without using bedrails?: A Little Help needed moving to and from a bed to a chair (including a wheelchair)?: A  Little Help needed standing up from a chair using your arms (e.g., wheelchair or bedside chair)?: A Little Help needed to walk in hospital room?: A Little Help needed climbing 3-5 steps with a railing? : A Lot 6 Click Score: 17    End of Session   Activity Tolerance: Patient tolerated treatment well;Patient limited by fatigue Patient left: in chair;with call bell/phone within reach;with chair alarm set;with family/visitor present Nurse Communication: Mobility status PT Visit Diagnosis: Other abnormalities of gait and mobility (R26.89);Pain Pain - part of body: (abdoment)     Time: 1532-1600 PT Time Calculation (min) (ACUTE ONLY): 28 min  Charges:  $Gait Training: 8-22 mins $Therapeutic Activity: 8-22 mins                     01/28/2019  Ginger Carne., PT Acute Rehabilitation Services [phone]  (pager) [phone]  (office)   Leah Farmer 01/28/2019, 7:08 PM

## 2019-01-28 NOTE — Progress Notes (Signed)
PHARMACY - TOTAL PARENTERAL NUTRITION CONSULT NOTE  Indication: Prolonged ileus  Patient Measurements: Height: 5\' 5"  (165.1 cm) Weight: 264 lb 12.4 oz (120.1 kg) IBW/kg (Calculated) : 57 TPN AdjBW (KG): 69.6 Body mass index is 44.06 kg/m. Usual Weight: ~113.6 kg  Assessment:  66 yof with a neuroendocrine tumor of the pancreas admitted for surgery and is now post-op Whipple procedure and distal pancreatectomy on 12/15. Pathology is pending. She was started on clear liquids 12/18 with fair appetite and advanced to regular diet 12/20 however had respiratory distress and hypoxia overnight 12/20 requiring re-intubation (extubated 12/22). CT abd/pelvis shows likely SB ileus.  Glucose / Insulin: hx DM with A1c 7.5%, on Janumet PTA.  CBGs < 180 on 22 units SSI + Lantus 20 units BID (*PM Lantus dose held 12/27) + 55 units of regular insulin in TPN.  Electrolytes: Na low normal Renal: SCr 0.67, BUN 22 LFTs / TGs: LFTs / tbili WNL, TG 256>241 (last 12/22, ILE 34% of kCal to reduce CHO) Prealbumin / albumin: albumin 2.7, prealbumin 14.1 Intake / Output; MIVF: drain O/P 181mL (down), UOP 0.4 ml/kg/hr. Octreotide added 12/25.  Overall I/O's inaccurate  GI Imaging: 12/20 CT abd/pelvis - distended gas filled loops of colon and SB likely due to ileus, small amount pelvic free fluid, probable mild hepatic steatosis Surgeries / Procedures:  12/15 Diagnostic laparoscopy, classic pancreaticoduodenectomy, distal pancreatectomy, placement of biliary and pancreatic duct stent  12/21 IR thoracentesis yielding 300 ml clear yellow fluid  Central access: double lumen PICC placed 01/21/19 TPN start date: 01/21/19  Nutritional Goals (per RD rec on 12/22): 2100-2300 kCal, 110-125g protein, ~2L fluid per day  Current Nutrition:  TPN Bariatric full liquid diet started 12/27 - consume up to 100% of meals Glucerna TID added 12/28  Plan:  Continue TPN at goal rate of 90 ml/hr, providing 124g AA, 263g CHO and  71.4g ILE for a total of 2106 kCal, meeting ~100% of patient needs Electrolytes in TPN: increase Na slightly, reduce K slightly, max acetate  Add standard MVI on MWF and trace elements daily to TPN Continue resistant SSI Q4H + Lantus 20 units BID per MD + 55 units regular insulin in TPN (Pharmacy to adjust insulin in TPN per TRH) F/U diet advancement to wean TPN, CBGs, repeat labs on Thurs  Savan Ruta D. Mina Marble, PharmD, BCPS, [place] 01/28/2019, 11:20 AM

## 2019-01-28 NOTE — Progress Notes (Signed)
13 Days Post-Op   Subjective/Chief Complaint: Doing better.  Feels improved.  Passing gas and having stool.     Objective: Vital signs in last 24 hours: Temp:  [97.5 F (36.4 C)-99.2 F (37.3 C)] 98.3 F (36.8 C) (12/28 1212) Pulse Rate:  [88-105] 88 (12/28 1212) Resp:  [18-21] 21 (12/28 1212) BP: (105-151)/(68-85) 105/72 (12/28 1212) SpO2:  [98 %-99 %] 98 % (12/28 1212) Last BM Date: 01/27/19  Intake/Output from previous day: 12/27 0701 - 12/28 0700 In: 1867.5 [P.O.:460; I.V.:1207.5; IV Piggyback:200] Out: 1736 [Urine:1600; Drains:135; Stool:1] Intake/Output this shift: Total I/O In: -  Out: 850 [Urine:700; Drains:150]  PE: General appearance:alert, cooperative and no distress Resp:breathing comfortably [id number], incisional tenderness; minimal bowel sounds; drains with cloudy output.   Incisions c/d/i  Lab Results:  Recent Labs    01/27/19 0742 01/28/19 0705  WBC 17.3* 13.5*  HGB 9.2* 9.4*  HCT 29.6* 30.4*  PLT 331 356   BMET Recent Labs    01/27/19 0742 01/28/19 0705  NA 137 135  K 4.5 4.5  CL 104 100  CO2 23 23  GLUCOSE 151* 180*  BUN 20 22*  CREATININE 0.66 0.67  CALCIUM 8.3* 8.3*   PT/INR No results for input(s): LABPROT, INR in the last 72 hours. ABG No results for input(s): PHART, HCO3 in the last 72 hours.  Invalid input(s): PCO2, PO2  Studies/Results: DG Chest 1 View  Result Date: 01/27/2019 CLINICAL DATA:  Post left-sided thoracentesis. EXAM: CHEST  1 VIEW COMPARISON:  Radiographs 01/26/2019 and 01/24/2019. CT 01/20/2019. FINDINGS: 1128 hours. The left pleural effusion is decreased in volume. There is mildly improved aeration of the left lung base. No pneumothorax. Right arm PICC projects to the superior cavoatrial junction. The heart size and mediastinal contours are stable. IMPRESSION: 1. Decreased left pleural effusion and improved aeration of the left lung base status post thoracentesis. 2. No pneumothorax seen. Electronically Signed    By: Richardean Sale M.D.   On: 01/27/2019 11:46   US THORACENTESIS ASP PLEURAL SPACE W/IMG GUIDE  Result Date: 01/27/2019 INDICATION: Patient with history of pancreatic tumor, acute hypoxic respiratory failure, possible pneumonia and small left-sided pleural effusion. Request to IR for diagnostic and therapeutic thoracentesis. EXAM: ULTRASOUND GUIDED LEFT THORACENTESIS MEDICATIONS: 10 mL 1% lidocaine COMPLICATIONS: None immediate. PROCEDURE: An ultrasound guided thoracentesis was thoroughly discussed with the patient and questions answered. The benefits, risks, alternatives and complications were also discussed. The patient understands and wishes to proceed with the procedure. Written consent was obtained. Ultrasound was performed to localize and mark an adequate pocket of fluid in the left chest. The area was then prepped and draped in the normal sterile fashion. 1% Lidocaine was used for local anesthesia. Under ultrasound guidance a 6 Fr Safe-T-Centesis catheter was introduced. Thoracentesis was performed. The catheter was removed and a dressing applied. FINDINGS: A total of approximately 300 mL of clear, yellow fluid was removed. Samples were sent to the laboratory as requested by the clinical team. IMPRESSION: Successful ultrasound guided left thoracentesis yielding 300 mL of pleural fluid. Read by Candiss Norse, PA-C Electronically Signed   By: Aletta Edouard M.D.   On: 01/27/2019 11:15    Anti-infectives: Anti-infectives (From admission, onward)   Start     Dose/Rate Route Frequency Ordered Stop   01/19/19 1430  vancomycin (VANCOREADY) IVPB 1500 mg/300 mL  Status:  Discontinued     1,500 mg 150 mL/hr over 120 Minutes Intravenous Every 24 hours 01/19/19 1351 01/22/19 0952   01/19/19 1400  ceFEPIme (MAXIPIME) 2 g in sodium chloride 0.9 % 100 mL IVPB     2 g 200 mL/hr over 30 Minutes Intravenous Every 8 hours 01/19/19 1349     01/15/19 2000  ceFAZolin (ANCEF) IVPB 2g/100 mL premix     2  g 200 mL/hr over 30 Minutes Intravenous Every 8 hours 01/15/19 1712 01/15/19 2052   01/15/19 0600  ceFAZolin (ANCEF) IVPB 2g/100 mL premix     2 g 200 mL/hr over 30 Minutes Intravenous On call to O.R. 01/15/19 0549 01/15/19 1145      Assessment/Plan: S/p Whipple- 12/15 Dr. Barry Dienes. Drainswith highamylasex3.  Leave in place for now.  Octreotide while in house.   ABL anemia-Hgb stable Pancreatic leak DM- On resistant SSI and glargine. TRH following - appreciate assistance. Leukocytosis/ID-afebrile, WBC down to 13today.CXR suspicious for pneumoniaandbibasilar atelectasis.Vanc/cefepime started12/20. IR thoracentesis12/21, cx NGTD, but pleural fluid c/w exudative effusion.  Plan for Thora yesterday.BloodNeg. Reintubated12/20, resp cx 12/21 re-incubated.CT C/A/P12/20 with peri-pancreatic 3.6 x 7.6 x 4.6cm fluid collection is not formed and no gas present. Vent dependent respiratory failure-extubated F/E/N- ileus appears to be resolved.  Advance diet.  Calorie counts.  Continue TNA until input is better.   Acidosis- resolved AKI- resolved Elevated LFTs- resolved R foot drop s/p epidural-Will order foot brace to help with ambulation   LOS: 13 days    Stark Klein 01/28/2019

## 2019-01-28 NOTE — Progress Notes (Signed)
PROGRESS NOTE    Leah Farmer  [id number] DOB: 1965-05-18 DOA: 01/15/2019 PCP: Margy Clarks, NP   Brief Narrative: Leah Farmer is a 53 y.o. female, PMH of type II DM, HTN, GERD, anxiety and depression, NAFL, primary malignant neuroendocrine tumor of the pancreas x2, s/p diagnostic laparoscopy, classic pancreatico duodenectomy, distal pancreatectomy, placement of biliary and pancreatic duct stents by CCS on 12/15, TRH was consulted on 12/16 to assist with management of uncontrolled DM and pneumonia.  Postop course complicated by postop ileus, sinus tachycardia, expected acute blood loss anemia and electrolyte abnormalities in addition to respiratory failure requiring short term intubation.    Assessment & Plan:   Principal Problem:   Primary malignant neuroendocrine neoplasm of pancreas [facility]) Active Problems:   Pancreatic tumor   Diabetes mellitus type 2 in obese ([place])   HCAP (healthcare-associated pneumonia)   Essential hypertension   Depression   Pleural effusion, left   Acute respiratory failure with hypoxia (HCC)   OSA (obstructive sleep apnea)   Diabetes mellitus, type 2, uncontrolled with hyperglycemia Patient's A1C is 7.5% from 12/11. On Janumet XR as an outpatient. No worsened in setting of pancreatectomy and TPN. On Lantus 20 units BID, SSI and insulin in TPN. Blood sugar much better controlled. Patient receiving 65 units of regular insulin in TPN. Lantus held last night for some reason -Continue Lantus 20 units BID and SSI -Pharmacy to adjust inulin in TPN; will likely start needing less  Essential hypertension -Continue metoprolol  Wheezing No history of reactive airway/asthma/COPD. Wheezing resolved -Continue Xopenex  Sinus tachycardia Persistent. Patient is on metoprolol. Likely multifactorial. Patient states she has a history of this chronically. -Can discontinue telemetry from this standpoint as needed  Possible pneumonia Chest x-ray  concerning for possible pneumonia vs atelectasis. Associated leukocytosis in setting of recent surgery in addition to acute respiratory failure requiring intubation. Now extubated. Repeat chest x-ray showing re-accumulating pleural effusion. Completed 8 days of cefepime. Repeat thoracentesis (12/27) not suggestive of infectious process. Cytology pending. -Continue incentive spirometer  Acute respiratory failure with hypoxia Likely secondary to possible Pneumonia/atelectasis/effusions. Required intubation briefly from 12/21 to 12/22. Now on room air. Concern for possible OSA now using CPAP. Patient is refusing CPAP. Importance discussed with patient and voiced understanding but states she is claustrophobic and anxious. -Continue CPAP qhs  Primary malignant neuroendocrine pancreatic tumor Patient is s/p pancreaticoduodenectomy and hepaticojejunostomy.  -Per general surgery  Acute blood loss anemia Post op. Required 3 units of PRBC. Stable.  Leukocytosis In setting of recent surgery, possible pneumonia. Blood cultures with no growth to date. Patient was started on Vancomycin and Cefepime empirically and transitioned to Cefepime. CBC improving.  Acute kidney injury Peak creatinine of 1.55. Baseline appears to be around 1. Resolved.  Hypomagnesemia Hypophosphatemia Per pharmacy while on TPN  Metabolic acidosis Resolved.  Prolonged QTc Last EKG with normal QTc  Morbid obesity Body mass index is 44.06 kg/m.  Anxiety Patient states she has panic attacks -Continue ativan prn  Abnormal LFTs Transient. Resolved.  Right foot drop Brace per primary   DVT prophylaxis: Lovenox Code Status:   Code Status: Full Code Family Communication: None Disposition Plan: Discharge per primary and continued medical improvement   Procedures:   s/p diagnostic laparoscopy, classic pancreatico duodenectomy, distal pancreatectomy, placement of biliary and pancreatic duct stents by CCS on  12/15  Has 2 abdominal drains on the right and one on the left.  Right IJ central line and Foley catheter have been discontinued.  Antimicrobials:  Vancomycin  Cefepime    Subjective: No issues. Has tolerated room air well. Breathing somewhat better since thoracentesis.  Objective: Vitals:   01/27/19 1948 01/28/19 0342 01/28/19 0810 01/28/19 0929  BP: 120/71 120/68 134/81   Pulse: (!) 105 94 96   Resp:   19   Temp: 99.1 F (37.3 C) 99.2 F (37.3 C) (!) 97.5 F (36.4 C) 98.3 F (36.8 C)  TempSrc: Oral Oral Oral Oral  SpO2:   98%   Weight:      Height:        Intake/Output Summary (Last 24 hours) at 01/28/2019 0948 Last data filed at 01/28/2019 0925 Gross per 24 hour  Intake 1867.5 ml  Output 1886 ml  Net -18.5 ml   Filed Weights   01/15/19 0612 01/21/19 1004 01/22/19 0318  Weight: 114.3 kg 117.3 kg 120.1 kg    Examination:  General exam: Appears calm and comfortable Respiratory system: Clear to auscultation. Respiratory effort normal. Cardiovascular system: S1 & S2 heard, RRR. No murmurs, rubs, gallops or clicks. Gastrointestinal system: Abdomen is nondistended, soft and nontender. No organomegaly or masses felt. Normal bowel sounds heard. Central nervous system: Alert and oriented. No focal neurological deficits. Extremities: No edema. No calf tenderness Skin: No cyanosis. No rashes Psychiatry: Judgement and insight appear normal. Mood & affect appropriate.        Data Reviewed: I have personally reviewed following labs and imaging studies  CBC: Recent Labs  Lab 01/22/19 0414 01/24/19 0200 01/25/19 0433 01/26/19 1159 01/27/19 0742 01/28/19 0705  WBC 25.1* 20.7* 17.8* 20.7* 17.3* 13.5*  NEUTROABS 18.7*  --   --   --   --   --   HGB 8.6* 8.8* 8.9* 9.7* 9.2* 9.4*  HCT 27.7* 27.3* 27.9* 31.7* 29.6* 30.4*  MCV 93.6 91.0 91.5 93.5 94.0 93.3  PLT 233 305 296 308 331 [id number]   Basic Metabolic Panel: Recent Labs  Lab 01/23/19 0546 01/24/19 0200  01/25/19 0433 01/26/19 1159 01/27/19 0742 01/28/19 0705  NA 144 145 145 136 137 135  K 3.6 3.7 3.9 4.8 4.5 4.5  CL 112* 114* 111 104 104 100  CO2 22 21* 23 24 23 23   GLUCOSE 270* 219* 160* 189* 151* 180*  BUN 24* 23* 22* 21* 20 22*  CREATININE 0.88 0.92 0.64 0.69 0.66 0.67  CALCIUM 8.3* 8.2* 8.2* 8.1* 8.3* 8.3*  MG 2.1 2.1 1.9 2.1 2.0  --   PHOS 1.4* 1.8* 2.7 3.5 3.4  --    GFR: Estimated Creatinine Clearance: 106.7 mL/min (by C-G formula based on SCr of 0.67 mg/dL). Liver Function Tests: Recent Labs  Lab 01/22/19 0545 01/23/19 0546 01/24/19 0200 01/25/19 0433  AST 18 21 27 30   ALT 16 17 18 19   ALKPHOS 51 62 61 68  BILITOT 1.3* 1.1 0.9 0.5  PROT 6.0* 5.8* 5.8* 5.5*  ALBUMIN 3.5 2.7* 2.5* 2.2*   No results for input(s): LIPASE, AMYLASE in the last 168 hours. No results for input(s): AMMONIA in the last 168 hours. Coagulation Profile: No results for input(s): INR, PROTIME in the last 168 hours. Cardiac Enzymes: No results for input(s): CKTOTAL, CKMB, CKMBINDEX, TROPONINI in the last 168 hours. BNP (last 3 results) No results for input(s): PROBNP in the last 8760 hours. HbA1C: No results for input(s): HGBA1C in the last 72 hours. CBG: Recent Labs  Lab 01/27/19 1517 01/27/19 1942 01/27/19 2347 01/28/19 0344 01/28/19 0745  GLUCAP 173* 125* 172* 155* 167*   Lipid Profile: No results for input(s): CHOL,  HDL, LDLCALC, TRIG, CHOLHDL, LDLDIRECT in the last 72 hours. Thyroid Function Tests: No results for input(s): TSH, T4TOTAL, FREET4, T3FREE, THYROIDAB in the last 72 hours. Anemia Panel: No results for input(s): VITAMINB12, FOLATE, FERRITIN, TIBC, IRON, RETICCTPCT in the last 72 hours. Sepsis Labs: No results for input(s): PROCALCITON, LATICACIDVEN in the last 168 hours.  Recent Results (from the past 240 hour(s))  Culture, blood (routine x 2)     Status: None   Collection Time: 01/19/19 10:42 AM   Specimen: BLOOD RIGHT HAND  Result Value Ref Range Status    Specimen Description BLOOD RIGHT HAND  Final   Special Requests   Final    BOTTLES DRAWN AEROBIC AND ANAEROBIC Blood Culture adequate volume   Culture   Final    NO GROWTH 5 DAYS Performed at [facility], [address] [place], [place] [address]    Report Status 01/24/2019 FINAL  Final  Culture, blood (routine x 2)     Status: None   Collection Time: 01/19/19 10:50 AM   Specimen: BLOOD  Result Value Ref Range Status   Specimen Description BLOOD BLOOD RIGHT WRIST  Final   Special Requests   Final    BOTTLES DRAWN AEROBIC AND ANAEROBIC Blood Culture adequate volume   Culture   Final    NO GROWTH 5 DAYS Performed at [facility], [place] [address] [place], [place] [address]    Report Status 01/24/2019 FINAL  Final  Culture, respiratory (non-expectorated)     Status: None   Collection Time: 01/21/19 10:32 AM   Specimen: Tracheal Aspirate; Respiratory  Result Value Ref Range Status   Specimen Description TRACHEAL ASPIRATE  Final   Special Requests NONE  Final   Gram Stain   Final    RARE WBC PRESENT, PREDOMINANTLY PMN RARE YEAST Performed at [facility], [place] [address], [place], [place] [address]    Culture FEW CANDIDA LUSITANIAE  Final   Report Status 01/24/2019 FINAL  Final  Culture, body fluid-bottle     Status: None   Collection Time: 01/21/19 10:39 AM   Specimen: Pleura  Result Value Ref Range Status   Specimen Description PLEURAL LEFT  Final   Special Requests NONE  Final   Culture   Final    NO GROWTH 5 DAYS Performed at [facility], [address] [place], [place] [address]    Report Status 01/26/2019 FINAL  Final  Gram stain     Status: None   Collection Time: 01/21/19 10:39 AM   Specimen: Pleura  Result Value Ref Range Status   Specimen Description PLEURAL LEFT  Final   Special Requests NONE  Final   Gram Stain   Final    FEW WBC PRESENT, PREDOMINANTLY PMN NO ORGANISMS SEEN Performed at [facility], [place] [address],  [place], [place] [address]    Report Status 01/21/2019 FINAL  Final  Culture, respiratory (non-expectorated)     Status: None   Collection Time: 01/22/19  9:45 AM   Specimen: Tracheal Aspirate; Respiratory  Result Value Ref Range Status   Specimen Description TRACHEAL ASPIRATE  Final   Special Requests NONE  Final   Gram Stain NO WBC SEEN NO ORGANISMS SEEN   Final   Culture   Final    Consistent with normal respiratory flora. Performed at [facility], [place] [address] [place], [place] [address]    Report Status 01/24/2019 FINAL  Final  Gram stain  Status: None   Collection Time: 01/27/19 11:15 AM   Specimen: Pleura  Result Value Ref Range Status   Specimen Description PLEURAL LEFT  Final   Special Requests NONE  Final   Gram Stain   Final    RARE WBC PRESENT, PREDOMINANTLY MONONUCLEAR NO ORGANISMS SEEN Performed at [facility], [address] [place], [place] [address]    Report Status 01/27/2019 FINAL  Final         Radiology Studies: DG Chest 1 View  Result Date: 01/27/2019 CLINICAL DATA:  Post left-sided thoracentesis. EXAM: CHEST  1 VIEW COMPARISON:  Radiographs 01/26/2019 and 01/24/2019. CT 01/20/2019. FINDINGS: 1128 hours. The left pleural effusion is decreased in volume. There is mildly improved aeration of the left lung base. No pneumothorax. Right arm PICC projects to the superior cavoatrial junction. The heart size and mediastinal contours are stable. IMPRESSION: 1. Decreased left pleural effusion and improved aeration of the left lung base status post thoracentesis. 2. No pneumothorax seen. Electronically Signed   By: Richardean Sale M.D.   On: 01/27/2019 11:46   US THORACENTESIS ASP PLEURAL SPACE W/IMG GUIDE  Result Date: 01/27/2019 INDICATION: Patient with history of pancreatic tumor, acute hypoxic respiratory failure, possible pneumonia and small left-sided pleural effusion. Request to IR for diagnostic and therapeutic thoracentesis. EXAM:  ULTRASOUND GUIDED LEFT THORACENTESIS MEDICATIONS: 10 mL 1% lidocaine COMPLICATIONS: None immediate. PROCEDURE: An ultrasound guided thoracentesis was thoroughly discussed with the patient and questions answered. The benefits, risks, alternatives and complications were also discussed. The patient understands and wishes to proceed with the procedure. Written consent was obtained. Ultrasound was performed to localize and mark an adequate pocket of fluid in the left chest. The area was then prepped and draped in the normal sterile fashion. 1% Lidocaine was used for local anesthesia. Under ultrasound guidance a 6 Fr Safe-T-Centesis catheter was introduced. Thoracentesis was performed. The catheter was removed and a dressing applied. FINDINGS: A total of approximately 300 mL of clear, yellow fluid was removed. Samples were sent to the laboratory as requested by the clinical team. IMPRESSION: Successful ultrasound guided left thoracentesis yielding 300 mL of pleural fluid. Read by Candiss Norse, PA-C Electronically Signed   By: Aletta Edouard M.D.   On: 01/27/2019 11:15        Scheduled Meds: . sodium chloride   Intravenous Once  . Chlorhexidine Gluconate Cloth  6 each Topical Daily  . enoxaparin (LOVENOX) injection  40 mg Subcutaneous Q24H  . insulin aspart  0-20 Units Subcutaneous Q4H  . insulin glargine  20 Units Subcutaneous BID  . metoprolol tartrate  25 mg Per Tube BID  . octreotide  200 mcg Subcutaneous Q12H   Continuous Infusions: . sodium chloride 50 mL/hr at 01/27/19 2355  . ceFEPime (MAXIPIME) IV 2 g (01/28/19 0520)  . TPN ADULT (ION) 90 mL/hr at 01/27/19 1749     LOS: 13 days     Cordelia Poche, MD [facility] 01/28/2019, 9:48 AM  If 7PM-7AM, please contact night-coverage [email or web address]

## 2019-01-28 NOTE — Plan of Care (Signed)
  Problem: Pain Managment: Goal: General experience of comfort will improve Outcome: Progressing Note: Pain has been more tolerable with addition of oral pain medication Oxycodone PRN. Pain is being addressed by PRN and scheduled medications, comfort measures, and distraction. Pain is re-assessed as needed. Will continue to monitor for effectiveness.     Problem: Coping: Goal: Level of anxiety will decrease Note: Pt with occasional periods of anxiety "just with everything going on", emotional support and reassurance provided.

## 2019-01-29 LAB — GLUCOSE, CAPILLARY
Glucose-Capillary: 119 mg/dL — ABNORMAL HIGH (ref 70–99)
Glucose-Capillary: 167 mg/dL — ABNORMAL HIGH (ref 70–99)
Glucose-Capillary: 135 mg/dL — ABNORMAL HIGH (ref 70–99)
Glucose-Capillary: 148 mg/dL — ABNORMAL HIGH (ref 70–99)
Glucose-Capillary: 135 mg/dL — ABNORMAL HIGH (ref 70–99)

## 2019-01-29 LAB — CBC
HCT: 29.9 % — ABNORMAL LOW (ref 36.0–46.0)
Hemoglobin: 9.3 g/dL — ABNORMAL LOW (ref 12.0–15.0)
MCH: 29.1 pg (ref 26.0–34.0)
MCHC: 31.1 g/dL (ref 30.0–36.0)
MCV: 93.4 fL (ref 80.0–100.0)
Platelets: 342 10*3/uL (ref 150–400)
RBC: 3.2 MIL/uL — ABNORMAL LOW (ref 3.87–5.11)
RDW: 16.1 % — ABNORMAL HIGH (ref 11.5–15.5)
WBC: 12.4 10*3/uL — ABNORMAL HIGH (ref 4.0–10.5)
nRBC: 0.2 % (ref 0.0–0.2)

## 2019-01-29 LAB — BASIC METABOLIC PANEL
Anion gap: 8 (ref 5–15)
BUN: 23 mg/dL — ABNORMAL HIGH (ref 6–20)
CO2: 24 mmol/L (ref 22–32)
Calcium: 8.3 mg/dL — ABNORMAL LOW (ref 8.9–10.3)
Chloride: 103 mmol/L (ref 98–111)
Creatinine, Ser: 0.68 mg/dL (ref 0.44–1.00)
GFR calc Af Amer: >60 mL/min (ref >60)
GFR calc non Af Amer: >60 mL/min (ref >60)
Glucose, Bld: 148 mg/dL — ABNORMAL HIGH (ref 70–99)
Potassium: 4.5 mmol/L (ref 3.5–5.1)
Sodium: 135 mmol/L (ref 135–145)

## 2019-01-29 LAB — TRIGLYCERIDES: Triglycerides: 130 mg/dL (ref <150)

## 2019-01-29 MED ORDER — PANTOPRAZOLE SODIUM 40 MG PO TBEC: 40.0000 mg | DELAYED_RELEASE_TABLET | Freq: Every day | ORAL | 3 refills | Status: DC

## 2019-01-29 MED ORDER — GLUCERNA SHAKE PO LIQD
237.0000 mL | Freq: Two times a day (BID) | ORAL | Status: DC
  Administered 2019-01-30: 15:00:00 237 mL via ORAL

## 2019-01-29 MED ORDER — INSULIN STARTER KIT- PEN NEEDLES (ENGLISH)
1.0000 | Freq: Once | Status: AC
  Administered 2019-01-30: 1
  Filled 2019-01-29: qty 1

## 2019-01-29 MED ORDER — AMOXICILLIN-POT CLAVULANATE 875-125 MG PO TABS: 1.0000 | ORAL_TABLET | Freq: Two times a day (BID) | ORAL | 0 refills | Status: DC

## 2019-01-29 MED ORDER — ENSURE ENLIVE PO LIQD: 237.0000 mL | Freq: Two times a day (BID) | ORAL | Status: DC

## 2019-01-29 MED ORDER — GLUCERNA SHAKE PO LIQD: 237.0000 mL | Freq: Three times a day (TID) | ORAL | 0 refills | Status: DC

## 2019-01-29 MED ORDER — AMOXICILLIN-POT CLAVULANATE 875-125 MG PO TABS
1.0000 | ORAL_TABLET | Freq: Two times a day (BID) | ORAL | Status: DC
  Administered 2019-01-29 – 2019-01-31 (×5): 1 via ORAL
  Filled 2019-01-29 (×6): qty 1

## 2019-01-29 NOTE — Progress Notes (Signed)
Day 1 of 2 Calorie Count Note  48 hour calorie count ordered.  Diet: Soft diet with thin liquids Supplements: Glucerna Shake po TID, each supplement provides 220 kcal and 10 grams of protein  Breakfast: 127 kcal, 8 grams of protein Lunch: 198 kcal, 16 grams of protein Dinner: 126 kcal, 8 grams of protein Supplements: 440 kcal, 20 grams of protein  Day 1 Total intake: 891 kcal (42% of minimum estimated needs)  52 grams of protein (47% of minimum estimated needs)  Estimated Nutritional Needs:  Kcal:  2100-2300 Protein:  110-125 grams Fluid:  2 L/day  Plans to wean off TPN today.   Nutrition Dx:  Increased nutrient needs related to post-op healing as evidenced by estimated needs; ongoing  Goal:  Pt to meet >/= 90% of their estimated nutrition needs; progressing  Intervention:   Continue calorie count---RD to follow up tomorrow for final results.  Provide Ensure Enlive po BID, each supplement provides 350 kcal and 20 grams of protein  Provide Glucerna Shake po BID, each supplement provides 220 kcal and 10 grams of protein  Encourage adequate PO intake.   Corrin Parker, MS, RD, LDN Pager # [phone] After hours/ weekend pager # [phone]

## 2019-01-29 NOTE — Progress Notes (Signed)
PHARMACY - TOTAL PARENTERAL NUTRITION CONSULT NOTE  Indication: Prolonged ileus  Patient Measurements: Height: 5\' 5"  (165.1 cm) Weight: 264 lb 12.4 oz (120.1 kg) IBW/kg (Calculated) : 57 TPN AdjBW (KG): 69.6 Body mass index is 44.06 kg/m. Usual Weight: ~113.6 kg  Assessment:  30 yof with a neuroendocrine tumor of the pancreas admitted for surgery and is now post-op Whipple procedure and distal pancreatectomy on 12/15. Pathology is pending. She was started on clear liquids 12/18 with fair appetite and advanced to regular diet 12/20 however had respiratory distress and hypoxia overnight 12/20 requiring re-intubation (extubated 12/22). CT abd/pelvis shows likely SB ileus.  Glucose / Insulin: hx DM with A1c 7.5%, on Janumet PTA.  CBGs < 180 on 23 units SSI + Lantus 20 units BID + 55 units of regular insulin in TPN.  Electrolytes: Na low normal, others WNL Renal: SCr 0.68, BUN 23 LFTs / TGs: LFTs / tbili WNL, TG 256>130 (ILE 34% of kCal to reduce CHO) Prealbumin / albumin: albumin 2.7, prealbumin 14.1 Intake / Output; MIVF: drain O/P 167mL (down), UOP 0.4 ml/kg/hr. Octreotide added 12/25.  Overall I/O's inaccurate  GI Imaging: 12/20 CT abd/pelvis - distended gas filled loops of colon and SB likely due to ileus, small amount pelvic free fluid, probable mild hepatic steatosis Surgeries / Procedures:  12/15 Diagnostic laparoscopy, classic pancreaticoduodenectomy, distal pancreatectomy, placement of biliary and pancreatic duct stent  12/21 IR thoracentesis yielding 300 ml clear yellow fluid  Central access: double lumen PICC placed 01/21/19 TPN start date: 01/21/19  Nutritional Goals (per RD rec on 12/22): 2100-2300 kCal, 110-125g protein, ~2L fluid per day  Current Nutrition:  TPN Soft diet started 12/28 - consume up to 100% of meals Glucerna TID added 12/28 (1 charted given)  Plan:  Wean TPN off per MD Reduce TPN to 45 ml/hr and stop at 1800 Continue resistant SSI Q4H + Lantus 20  units BID per MD (was getting 55 units regular insulin in TPN).  Add instruction to hold Lantus for CBGs < 115.  Further insulin adjustment per MD. D/C TPN labs and nursing care orders  Shauni Henner D. Mina Marble, PharmD, BCPS, [place] 01/29/2019, 9:21 AM

## 2019-01-29 NOTE — Plan of Care (Signed)
  Problem: Education: Goal: Knowledge of General Education information will improve Note: Patient and her husband educated on how to care for drains which involves but not limited to draining and site care. Husband able to empty drain with little to no cues. He verbalized he is comfortable to be home with drain.   Education also provided on blood sugar check and insulin administration as a start of teaching to prepare for home and discharge. Pt and husband will need more teaching as pt has "anxiety about everything going on." Will continue to provide support and education.

## 2019-01-29 NOTE — Progress Notes (Signed)
14 Days Post-Op   Subjective/Chief Complaint: Doing better.  Feels improved.  Passing gas and having stool.  Eating some.  No n/v.     Objective: Vital signs in last 24 hours: Temp:  [98.1 F (36.7 C)-99.1 F (37.3 C)] 99.1 F (37.3 C) (12/29 0824) Pulse Rate:  [88-104] 95 (12/29 0824) Resp:  [20-23] 20 (12/29 0824) BP: (105-146)/(58-99) 133/58 (12/29 0824) SpO2:  [95 %-98 %] 96 % (12/29 0824) Last BM Date: 01/27/19  Intake/Output from previous day: 12/28 0701 - 12/29 0700 In: 2348.4 [P.O.:430; I.V.:1218.4; IV Piggyback:700] Out: 2252 [Urine:1850; Drains:400; Stool:2] Intake/Output this shift: Total I/O In: -  Out: 300 [Urine:300]  PE: General appearance:alert, cooperative and no distress Resp:breathing comfortably DM:[id number], incisional tenderness; minimal bowel sounds; drains with cloudy output.   Incisions c/d/i  Lab Results:  Recent Labs    01/28/19 0705 01/29/19 0542  WBC 13.5* 12.4*  HGB 9.4* 9.3*  HCT 30.4* 29.9*  PLT 356 342   BMET Recent Labs    01/28/19 0705 01/29/19 0542  NA 135 135  K 4.5 4.5  CL 100 103  CO2 23 24  GLUCOSE 180* 148*  BUN 22* 23*  CREATININE 0.67 0.68  CALCIUM 8.3* 8.3*   PT/INR No results for input(s): LABPROT, INR in the last 72 hours. ABG No results for input(s): PHART, HCO3 in the last 72 hours.  Invalid input(s): PCO2, PO2  Studies/Results: DG Chest 1 View  Result Date: 01/27/2019 CLINICAL DATA:  Post left-sided thoracentesis. EXAM: CHEST  1 VIEW COMPARISON:  Radiographs 01/26/2019 and 01/24/2019. CT 01/20/2019. FINDINGS: 1128 hours. The left pleural effusion is decreased in volume. There is mildly improved aeration of the left lung base. No pneumothorax. Right arm PICC projects to the superior cavoatrial junction. The heart size and mediastinal contours are stable. IMPRESSION: 1. Decreased left pleural effusion and improved aeration of the left lung base status post thoracentesis. 2. No pneumothorax seen.  Electronically Signed   By: Richardean Sale M.D.   On: 01/27/2019 11:46   US THORACENTESIS ASP PLEURAL SPACE W/IMG GUIDE  Result Date: 01/27/2019 INDICATION: Patient with history of pancreatic tumor, acute hypoxic respiratory failure, possible pneumonia and small left-sided pleural effusion. Request to IR for diagnostic and therapeutic thoracentesis. EXAM: ULTRASOUND GUIDED LEFT THORACENTESIS MEDICATIONS: 10 mL 1% lidocaine COMPLICATIONS: None immediate. PROCEDURE: An ultrasound guided thoracentesis was thoroughly discussed with the patient and questions answered. The benefits, risks, alternatives and complications were also discussed. The patient understands and wishes to proceed with the procedure. Written consent was obtained. Ultrasound was performed to localize and mark an adequate pocket of fluid in the left chest. The area was then prepped and draped in the normal sterile fashion. 1% Lidocaine was used for local anesthesia. Under ultrasound guidance a 6 Fr Safe-T-Centesis catheter was introduced. Thoracentesis was performed. The catheter was removed and a dressing applied. FINDINGS: A total of approximately 300 mL of clear, yellow fluid was removed. Samples were sent to the laboratory as requested by the clinical team. IMPRESSION: Successful ultrasound guided left thoracentesis yielding 300 mL of pleural fluid. Read by Candiss Norse, PA-C Electronically Signed   By: Aletta Edouard M.D.   On: 01/27/2019 11:15    Anti-infectives: Anti-infectives (From admission, onward)   Start     Dose/Rate Route Frequency Ordered Stop   01/29/19 1000  amoxicillin-clavulanate (AUGMENTIN) 875-125 MG per tablet 1 tablet     1 tablet Oral Every 12 hours 01/29/19 0902 02/02/19 0959   01/19/19 1430  vancomycin (  VANCOREADY) IVPB 1500 mg/300 mL  Status:  Discontinued     1,500 mg 150 mL/hr over 120 Minutes Intravenous Every 24 hours 01/19/19 1351 01/22/19 0952   01/19/19 1400  ceFEPIme (MAXIPIME) 2 g in sodium  chloride 0.9 % 100 mL IVPB  Status:  Discontinued     2 g 200 mL/hr over 30 Minutes Intravenous Every 8 hours 01/19/19 1349 01/29/19 0902   01/15/19 2000  ceFAZolin (ANCEF) IVPB 2g/100 mL premix     2 g 200 mL/hr over 30 Minutes Intravenous Every 8 hours 01/15/19 1712 01/15/19 2052   01/15/19 0600  ceFAZolin (ANCEF) IVPB 2g/100 mL premix     2 g 200 mL/hr over 30 Minutes Intravenous On call to O.R. 01/15/19 0549 01/15/19 1145      Assessment/Plan: S/p Whipple- 12/15 Dr. Barry Dienes. Drainswith highamylasex3.  Leave in place for now.  Drain teaching.   ABL anemia-Hgb stable Pancreatic leak DM- On resistant SSI and glargine. TRH following - appreciate assistance. Will need final information on amounts of lantus.  Will have insulin in TNA weaned as well.   Leukocytosis/ID-afebrile, WBC continuing to go down..CXR suspicious for pneumoniaandbibasilar atelectasis.Vanc/cefepime started12/20. D/c IV antibiotics, transition to oral for panc leak.  IR thoracentesis12/21, 12/27, cx NGTD, but pleural fluid c/w exudative effusion.   Vent dependent respiratory failure-extubated F/E/N- ileus appears to be resolved.  Advance diet.  Calorie counts. D/c TNA Acidosis- resolved AKI- resolved Elevated LFTs- resolved R foot drop s/p epidural-foot brace coming today.    Dispo:  Wean TNA, adjust insulin, calorie counts.  Likely d/c Thursday.  Switch to oral antibiotics.    LOS: 14 days    Stark Klein 01/29/2019

## 2019-01-29 NOTE — Progress Notes (Signed)
Physical Therapy Treatment Patient Details Name: Leah Farmer MRN: [id number] DOB: 18-Dec-1965 Today's Date: 01/29/2019    History of Present Illness pt is a 53 y/o female with pmh significant for HTN, arthritis, DM admitted with primary malignant pancreatic tumors x2, s/p whipple procedure.    PT Comments    Time spent with orthotist determining the correct AFO type and fitment.  Emphasis also on transitions, sit to stands and gait training.    Follow Up Recommendations  Outpatient PT;Supervision/Assistance - 24 hour     Equipment Recommendations  Rolling walker with 5" wheels;3in1 (PT)    Recommendations for Other Services       Precautions / Restrictions Precautions Precautions: Fall Precaution Comments: 3 drains    Mobility  Bed Mobility   Bed Mobility: Rolling;Sidelying to Sit Rolling: Min guard Sidelying to sit: Min guard       General bed mobility comments: Followed cues  Transfers Overall transfer level: Needs assistance Equipment used: Rolling walker (2 wheeled);None Transfers: Sit to/from Stand Sit to Stand: Min guard         General transfer comment: cues for hand placement/safety and technique with no assist  Ambulation/Gait Ambulation/Gait assistance: Min guard Gait Distance (Feet): 25 Feet(to 30 feet stretches x 6 with rest in between) Assistive device: Rolling walker (2 wheeled) Gait Pattern/deviations: Step-through pattern Gait velocity: slow Gait velocity interpretation: <1.31 ft/sec, indicative of household ambulator General Gait Details: Worked to improve gait with 4 different AFO's and arrived with one that worked best for motion and control.   Stairs             Wheelchair Mobility    Modified Rankin (Stroke Patients Only)       Balance Overall balance assessment: Needs assistance Sitting-balance support: Feet supported Sitting balance-[name]: Fair     Standing balance support: Single extremity  supported;Bilateral upper extremity supported;During functional activity Standing balance-[name]: Poor Standing balance comment: reliant on bil UE support in standing                            Cognition Arousal/Alertness: Awake/alert Behavior During Therapy: WFL for tasks assessed/performed Overall Cognitive Status: Within Functional Limits for tasks assessed                                        Exercises      General Comments General comments (skin integrity, edema, etc.): EHR rose into the high 120's and low 130's with each trial of activity with associated dyspnea and fatigue.      Pertinent Vitals/Pain Pain Assessment: Faces Faces Pain Scale: Hurts a little bit Pain Location: abdominal incisions Pain Descriptors / Indicators: Discomfort;Grimacing;Guarding Pain Intervention(s): Premedicated before session;Monitored during session    Home Living                      Prior Function            PT Goals (current goals can now be found in the care plan section) Acute Rehab PT Goals Patient Stated Goal: able to get home and into my chair for Christmas. PT Goal Formulation: With patient Time For Goal Achievement: 01/31/19 Potential to Achieve Goals: Good Progress towards PT goals: Progressing toward goals    Frequency    Min 3X/week      PT Plan Current plan remains appropriate  Co-evaluation              AM-PAC PT "6 Clicks" Mobility   Outcome Measure  Help needed turning from your back to your side while in a flat bed without using bedrails?: A Little Help needed moving from lying on your back to sitting on the side of a flat bed without using bedrails?: A Little Help needed moving to and from a bed to a chair (including a wheelchair)?: A Little Help needed standing up from a chair using your arms (e.g., wheelchair or bedside chair)?: A Little Help needed to walk in hospital room?: A Little Help needed  climbing 3-5 steps with a railing? : A Lot 6 Click Score: 17    End of Session   Activity Tolerance: Patient tolerated treatment well;Patient limited by fatigue Patient left: in bed;with call bell/phone within reach;with family/visitor present Nurse Communication: Mobility status PT Visit Diagnosis: Other abnormalities of gait and mobility (R26.89);Pain Pain - part of body: (abdomen)     Time: [id number] PT Time Calculation (min) (ACUTE ONLY): 58 min  Charges:  $Gait Training: 23-37 mins $Therapeutic Activity: 23-37 mins                     01/29/2019  Ginger Carne., PT Acute Rehabilitation Services [phone]  (pager) [phone]  (office)   Tessie Fass Kenidy Crossland 01/29/2019, 11:22 AM

## 2019-01-29 NOTE — Progress Notes (Signed)
PROGRESS NOTE    Leah Farmer  [id number] DOB: [birth year] DOA: 01/15/2019 PCP: Margy Clarks, NP   Brief Narrative: Leah Farmer is a 53 y.o. female, PMH of type II DM, HTN, GERD, anxiety and depression, NAFL, primary malignant neuroendocrine tumor of the pancreas x2, s/p diagnostic laparoscopy, classic pancreatico duodenectomy, distal pancreatectomy, placement of biliary and pancreatic duct stents by [facility] on 12/15, [facility] was consulted on 12/16 to assist with management of uncontrolled DM and pneumonia.  Postop course complicated by postop ileus, sinus tachycardia, expected acute blood loss anemia and electrolyte abnormalities in addition to respiratory failure requiring short term intubation.    Assessment & Plan:   Principal Problem:   Primary malignant neuroendocrine neoplasm of pancreas [facility]) Active Problems:   Pancreatic tumor   Diabetes mellitus type 2 in obese ([place])   HCAP (healthcare-associated pneumonia)   Essential hypertension   Depression   Pleural effusion, left   Acute respiratory failure with hypoxia (HCC)   OSA (obstructive sleep apnea)   Diabetes mellitus, type 2, uncontrolled with hyperglycemia Patient's A1C is 7.5% from 12/11. On Janumet XR as an outpatient. No worsened in setting of pancreatectomy and TPN. On Lantus 20 units BID, SSI and insulin in TPN. Blood sugar much better controlled. Patient receiving 55 units of regular insulin in TPN. Received a total of 106 units including Lantus, SSI and Regular insulin within TPN yesterday. -Continue Lantus 20 units BID and SSI -Pharmacy to adjust inulin in TPN -Once TPN weaned, will need to adjust Lantus and add mealtime coverage for insulin. Would aim for likely dose of Lantus 50-55 units (divided BID) in addition to meal coverage and SSI. May need to be titrated up depending on fasting blood sugar -Diabetes coordinator since newly starting insulin  Essential hypertension -Continue metoprolol  Wheezing  No history of reactive airway/asthma/COPD. Wheezing resolved -Continue Xopenex  Sinus tachycardia Persistent. Patient is on metoprolol. Likely multifactorial. Patient states she has a history of this chronically. -Can discontinue telemetry from this standpoint as needed  Possible pneumonia Chest x-ray concerning for possible pneumonia vs atelectasis. Associated leukocytosis in setting of recent surgery in addition to acute respiratory failure requiring intubation. Now extubated. Repeat chest x-ray showing re-accumulating pleural effusion. Completed 8 days of cefepime. Repeat thoracentesis (12/27) not suggestive of infectious process. Cytology pending. -Continue incentive spirometer  Acute respiratory failure with hypoxia Likely secondary to possible Pneumonia/atelectasis/effusions. Required intubation briefly from 12/21 to 12/22. Now on room air. Concern for possible OSA now using CPAP. Patient is refusing CPAP. Importance discussed with patient and voiced understanding but states she is claustrophobic and anxious. -Continue CPAP qhs  Primary malignant neuroendocrine pancreatic tumor Prolonged Ileus TPN Patient is s/p pancreaticoduodenectomy and hepaticojejunostomy.  -Per general surgery  Acute blood loss anemia Post op. Required 3 units of PRBC. Stable.  Leukocytosis In setting of recent surgery, possible pneumonia. Blood cultures with no growth to date. Patient was started on Vancomycin and Cefepime empirically and transitioned to Cefepime. CBC improving.  Acute kidney injury Peak creatinine of 1.55. Baseline appears to be around 1. Resolved.  Hypomagnesemia Hypophosphatemia Per pharmacy while on TPN  Metabolic acidosis Resolved.  Prolonged QTc Last EKG with normal QTc  Morbid obesity Body mass index is 44.06 kg/m.  Anxiety Patient states she has panic attacks -Continue ativan prn  Abnormal LFTs Transient. Resolved.  Right foot drop Brace per primary. PT    DVT prophylaxis: Lovenox Code Status:   Code Status: Full Code Family Communication: None Disposition Plan: Discharge per primary  and continued medical improvement   Procedures:   s/p diagnostic laparoscopy, classic pancreatico duodenectomy, distal pancreatectomy, placement of biliary and pancreatic duct stents by [facility] on 12/15  Has 2 abdominal drains on the right and one on the left.  Right IJ central line and Foley catheter have been discontinued.  Antimicrobials:  Vancomycin  Cefepime    Subjective: No concerns today. Feels good.  Objective: Vitals:   01/28/19 1953 01/28/19 2344 01/29/19 0405 01/29/19 0824  BP: 139/76 121/66 121/63 (!) 133/58  Pulse: (!) 103 (!) 104 96 95  Resp: (!) 23 (!) 23 20 20   Temp: 98.6 F (37 C) 98.1 F (36.7 C) 98.1 F (36.7 C) 99.1 F (37.3 C)  TempSrc: Oral Oral Oral   SpO2: 98% 95% 97% 96%  Weight:      Height:        Intake/Output Summary (Last 24 hours) at 01/29/2019 1043 Last data filed at 01/29/2019 0800 Gross per 24 hour  Intake 2608.35 ml  Output 1702 ml  Net 906.35 ml   Filed Weights   01/15/19 0612 01/21/19 1004 01/22/19 0318  Weight: 114.3 kg 117.3 kg 120.1 kg    Examination:  General exam: Appears calm and comfortable Respiratory system: Clear to auscultation. Respiratory effort normal. Cardiovascular system: S1 & S2 heard, RRR. No murmurs, rubs, gallops or clicks. Gastrointestinal system: Abdomen is nondistended, soft and nontender. No organomegaly or masses felt. Normal bowel sounds heard. Central nervous system: Alert and oriented. No focal neurological deficits. Extremities: No edema. No calf tenderness Skin: No cyanosis. No rashes Psychiatry: Judgement and insight appear normal. Mood & affect appropriate.         Data Reviewed: I have personally reviewed following labs and imaging studies  CBC: Recent Labs  Lab 01/25/19 0433 01/26/19 1159 01/27/19 0742 01/28/19 0705 01/29/19 0542  WBC 17.8*  20.7* 17.3* 13.5* 12.4*  NEUTROABS  --   --   --  9.0*  --   HGB 8.9* 9.7* 9.2* 9.4* 9.3*  HCT 27.9* 31.7* 29.6* 30.4* 29.9*  MCV 91.5 93.5 94.0 93.3 93.4  PLT 296 308 331 356 [id number]   Basic Metabolic Panel: Recent Labs  Lab 01/24/19 0200 01/25/19 0433 01/26/19 1159 01/27/19 0742 01/28/19 0705 01/29/19 0542  NA 145 145 136 137 135 135  K 3.7 3.9 4.8 4.5 4.5 4.5  CL 114* 111 104 104 100 103  CO2 21* 23 24 23 23 24   GLUCOSE 219* 160* 189* 151* 180* 148*  BUN 23* 22* 21* 20 22* 23*  CREATININE 0.92 0.64 0.69 0.66 0.67 0.68  CALCIUM 8.2* 8.2* 8.1* 8.3* 8.3* 8.3*  MG 2.1 1.9 2.1 2.0 2.0  --   PHOS 1.8* 2.7 3.5 3.4 3.2  --    GFR: Estimated Creatinine Clearance: 106.7 mL/min (by C-G formula based on SCr of 0.68 mg/dL). Liver Function Tests: Recent Labs  Lab 01/23/19 0546 01/24/19 0200 01/25/19 0433  AST 21 27 30   ALT 17 18 19   ALKPHOS 62 61 68  BILITOT 1.1 0.9 0.5  PROT 5.8* 5.8* 5.5*  ALBUMIN 2.7* 2.5* 2.2*   No results for input(s): LIPASE, AMYLASE in the last 168 hours. No results for input(s): AMMONIA in the last 168 hours. Coagulation Profile: No results for input(s): INR, PROTIME in the last 168 hours. Cardiac Enzymes: No results for input(s): CKTOTAL, CKMB, CKMBINDEX, TROPONINI in the last 168 hours. BNP (last 3 results) No results for input(s): PROBNP in the last 8760 hours. HbA1C: No results for input(s): HGBA1C  in the last 72 hours. CBG: Recent Labs  Lab 01/28/19 1611 01/28/19 2102 01/28/19 2347 01/29/19 0406 01/29/19 0759  GLUCAP 146* 178* 171* 119* 167*   Lipid Profile: Recent Labs    01/29/19 0542  TRIG 130   Thyroid Function Tests: No results for input(s): TSH, T4TOTAL, FREET4, T3FREE, THYROIDAB in the last 72 hours. Anemia Panel: No results for input(s): VITAMINB12, FOLATE, FERRITIN, TIBC, IRON, RETICCTPCT in the last 72 hours. Sepsis Labs: No results for input(s): PROCALCITON, LATICACIDVEN in the last 168 hours.  Recent Results (from  the past 240 hour(s))  Culture, blood (routine x 2)     Status: None   Collection Time: 01/19/19 10:50 AM   Specimen: BLOOD  Result Value Ref Range Status   Specimen Description BLOOD BLOOD RIGHT WRIST  Final   Special Requests   Final    BOTTLES DRAWN AEROBIC AND ANAEROBIC Blood Culture adequate volume   Culture   Final    NO GROWTH 5 DAYS Performed at [facility], [address], [place], [place] [address]    Report Status 01/24/2019 FINAL  Final  Culture, respiratory (non-expectorated)     Status: None   Collection Time: 01/21/19 10:32 AM   Specimen: Tracheal Aspirate; Respiratory  Result Value Ref Range Status   Specimen Description TRACHEAL ASPIRATE  Final   Special Requests NONE  Final   Gram Stain   Final    RARE WBC PRESENT, PREDOMINANTLY PMN RARE YEAST Performed at [facility], [place] [address], [place], [place] [address]    Culture FEW CANDIDA LUSITANIAE  Final   Report Status 01/24/2019 FINAL  Final  Culture, body fluid-bottle     Status: None   Collection Time: 01/21/19 10:39 AM   Specimen: Pleura  Result Value Ref Range Status   Specimen Description PLEURAL LEFT  Final   Special Requests NONE  Final   Culture   Final    NO GROWTH 5 DAYS Performed at [facility], [address], [place], [place] [address]    Report Status 01/26/2019 FINAL  Final  Gram stain     Status: None   Collection Time: 01/21/19 10:39 AM   Specimen: Pleura  Result Value Ref Range Status   Specimen Description PLEURAL LEFT  Final   Special Requests NONE  Final   Gram Stain   Final    FEW WBC PRESENT, PREDOMINANTLY PMN NO ORGANISMS SEEN Performed at [facility], [place] [address], [place], [place] [address]    Report Status 01/21/2019 FINAL  Final  Culture, respiratory (non-expectorated)     Status: None   Collection Time: 01/22/19  9:45 AM   Specimen: Tracheal Aspirate; Respiratory  Result Value Ref Range Status   Specimen Description TRACHEAL ASPIRATE   Final   Special Requests NONE  Final   Gram Stain NO WBC SEEN NO ORGANISMS SEEN   Final   Culture   Final    Consistent with normal respiratory flora. Performed at [facility], [place] [address], [place], [place] [address]    Report Status 01/24/2019 FINAL  Final  Culture, body fluid-bottle     Status: None (Preliminary result)   Collection Time: 01/27/19 11:15 AM   Specimen: Pleura  Result Value Ref Range Status   Specimen Description PLEURAL LEFT  Final   Special Requests NONE  Final   Culture   Final    NO GROWTH 1 DAY Performed at [facility], [address] [place], [place] [address]    Report Status PENDING  Incomplete  Gram stain     Status: None   Collection Time: 01/27/19 11:15 AM   Specimen: Pleura  Result Value Ref Range Status   Specimen Description PLEURAL LEFT  Final   Special Requests NONE  Final   Gram Stain   Final    RARE WBC PRESENT, PREDOMINANTLY MONONUCLEAR NO ORGANISMS SEEN Performed at [facility], [address] [place], [place] [address]    Report Status 01/27/2019 FINAL  Final         Radiology Studies: DG Chest 1 View  Result Date: 01/27/2019 CLINICAL DATA:  Post left-sided thoracentesis. EXAM: CHEST  1 VIEW COMPARISON:  Radiographs 01/26/2019 and 01/24/2019. CT 01/20/2019. FINDINGS: 1128 hours. The left pleural effusion is decreased in volume. There is mildly improved aeration of the left lung base. No pneumothorax. Right arm PICC projects to the superior cavoatrial junction. The heart size and mediastinal contours are stable. IMPRESSION: 1. Decreased left pleural effusion and improved aeration of the left lung base status post thoracentesis. 2. No pneumothorax seen. Electronically Signed   By: Richardean Sale M.D.   On: 01/27/2019 11:46   US THORACENTESIS ASP PLEURAL SPACE W/IMG GUIDE  Result Date: 01/27/2019 INDICATION: Patient with history of pancreatic tumor, acute hypoxic respiratory failure, possible pneumonia and  small left-sided pleural effusion. Request to IR for diagnostic and therapeutic thoracentesis. EXAM: ULTRASOUND GUIDED LEFT THORACENTESIS MEDICATIONS: 10 mL 1% lidocaine COMPLICATIONS: None immediate. PROCEDURE: An ultrasound guided thoracentesis was thoroughly discussed with the patient and questions answered. The benefits, risks, alternatives and complications were also discussed. The patient understands and wishes to proceed with the procedure. Written consent was obtained. Ultrasound was performed to localize and mark an adequate pocket of fluid in the left chest. The area was then prepped and draped in the normal sterile fashion. 1% Lidocaine was used for local anesthesia. Under ultrasound guidance a 6 Fr Safe-T-Centesis catheter was introduced. Thoracentesis was performed. The catheter was removed and a dressing applied. FINDINGS: A total of approximately 300 mL of clear, yellow fluid was removed. Samples were sent to the laboratory as requested by the clinical team. IMPRESSION: Successful ultrasound guided left thoracentesis yielding 300 mL of pleural fluid. Read by Candiss Norse, PA-C Electronically Signed   By: Aletta Edouard M.D.   On: 01/27/2019 11:15        Scheduled Meds: . sodium chloride   Intravenous Once  . amoxicillin-clavulanate  1 tablet Oral Q12H  . Chlorhexidine Gluconate Cloth  6 each Topical Daily  . enoxaparin (LOVENOX) injection  40 mg Subcutaneous Q24H  . feeding supplement (GLUCERNA SHAKE)  237 mL Oral TID BM  . hydrochlorothiazide  12.5 mg Oral Daily  . insulin aspart  0-20 Units Subcutaneous Q4H  . insulin glargine  20 Units Subcutaneous BID  . lisinopril  20 mg Oral Daily  . metoprolol tartrate  25 mg Per Tube BID  . octreotide  200 mcg Subcutaneous Q12H  . pantoprazole  40 mg Oral Q1200   Continuous Infusions: . sodium chloride 50 mL/hr at 01/27/19 2355  . TPN ADULT (ION) 90 mL/hr at 01/28/19 1726     LOS: 14 days     Cordelia Poche, MD [facility] 01/29/2019, 10:43 AM  If 7PM-7AM, please contact night-coverage [email or web address]

## 2019-01-30 DIAGNOSIS — F329 Major depressive disorder, single episode, unspecified: Secondary | ICD-10-CM

## 2019-01-30 LAB — GLUCOSE, CAPILLARY
Glucose-Capillary: 111 mg/dL — ABNORMAL HIGH (ref 70–99)
Glucose-Capillary: 133 mg/dL — ABNORMAL HIGH (ref 70–99)
Glucose-Capillary: 133 mg/dL — ABNORMAL HIGH (ref 70–99)
Glucose-Capillary: 155 mg/dL — ABNORMAL HIGH (ref 70–99)
Glucose-Capillary: 169 mg/dL — ABNORMAL HIGH (ref 70–99)
Glucose-Capillary: 174 mg/dL — ABNORMAL HIGH (ref 70–99)
Glucose-Capillary: 180 mg/dL — ABNORMAL HIGH (ref 70–99)

## 2019-01-30 LAB — CYTOLOGY - NON PAP

## 2019-01-30 MED ORDER — INSULIN PEN NEEDLE 32G X 4 MM MISC: 1.0000 | Freq: Two times a day (BID) | 0 refills | Status: DC

## 2019-01-30 MED ORDER — INSULIN GLARGINE 100 UNIT/ML ~~LOC~~ SOLN
25.0000 [IU] | Freq: Two times a day (BID) | SUBCUTANEOUS | Status: DC
  Administered 2019-01-30 – 2019-01-31 (×3): 25 [IU] via SUBCUTANEOUS
  Filled 2019-01-30 (×4): qty 0.25

## 2019-01-30 MED ORDER — GLUCERNA SHAKE PO LIQD
237.0000 mL | Freq: Four times a day (QID) | ORAL | Status: DC
  Administered 2019-01-31: 237 mL via ORAL

## 2019-01-30 MED ORDER — INSULIN ASPART 100 UNIT/ML ~~LOC~~ SOLN: 0.0000 [IU] | Freq: Every day | SUBCUTANEOUS | Status: DC

## 2019-01-30 MED ORDER — INSULIN SYRINGES (DISPOSABLE) U-100 1 ML MISC: 1.0000 | Freq: Two times a day (BID) | 0 refills | Status: DC

## 2019-01-30 MED ORDER — INSULIN ASPART 100 UNIT/ML ~~LOC~~ SOLN
0.0000 [IU] | Freq: Three times a day (TID) | SUBCUTANEOUS | Status: DC
  Administered 2019-01-30 – 2019-01-31 (×3): 3 [IU] via SUBCUTANEOUS

## 2019-01-30 MED ORDER — BLOOD GLUCOSE MONITOR KIT: PACK | 0 refills | Status: DC

## 2019-01-30 MED ORDER — LANTUS SOLOSTAR 100 UNIT/ML ~~LOC~~ SOPN: 25.0000 [IU] | PEN_INJECTOR | Freq: Two times a day (BID) | SUBCUTANEOUS | 11 refills | Status: DC

## 2019-01-30 MED ORDER — INSULIN STARTER KIT- PEN NEEDLES (ENGLISH): 1.0000 | Freq: Once | 0 refills | Status: AC

## 2019-01-30 MED ORDER — INSULIN GLARGINE 100 UNIT/ML ~~LOC~~ SOLN: 25.0000 [IU] | Freq: Two times a day (BID) | SUBCUTANEOUS | 3 refills | Status: DC

## 2019-01-30 NOTE — Progress Notes (Signed)
Physical Therapy Treatment Patient Details Name: Leah Farmer MRN: [id number] DOB: February 06, 1965 Today's Date: 01/30/2019    History of Present Illness pt is a 53 y/o female with pmh significant for HTN, arthritis, DM admitted with primary malignant pancreatic tumors x2, s/p whipple procedure.    PT Comments    Finalized education, DME needs and worked on fluid transitions, transfers and gait.,    Follow Up Recommendations  Home health PT;Supervision/Assistance - 24 hour     Equipment Recommendations  Rolling walker with 5" wheels;3in1 (PT)    Recommendations for Other Services       Precautions / Restrictions Precautions Precautions: Fall    Mobility  Bed Mobility               General bed mobility comments: at EOB on arrival  Transfers Overall transfer level: Needs assistance Equipment used: Rolling walker (2 wheeled);None Transfers: Sit to/from Stand Sit to Stand: Supervision(even from lower surfaces)         General transfer comment: cues for hand placement on occ.  Ambulation/Gait Ambulation/Gait assistance: Min guard;Supervision Gait Distance (Feet): 15 Feet(x2 and 20 x1) Assistive device: Rolling walker (2 wheeled) Gait Pattern/deviations: Step-through pattern Gait velocity: slow   General Gait Details: stable steppage gait due to no AFO at the time.   Stairs             Wheelchair Mobility    Modified Rankin (Stroke Patients Only)       Balance Overall balance assessment: Needs assistance Sitting-balance support: Feet supported Sitting balance-[name]: Fair       Standing balance-[name]: Poor Standing balance comment: reliant on bil UE support in standing                            Cognition Arousal/Alertness: Awake/alert Behavior During Therapy: WFL for tasks assessed/performed Overall Cognitive Status: Within Functional Limits for tasks assessed                                         Exercises      General Comments General comments (skin integrity, edema, etc.): HR up to the low 130's      Pertinent Vitals/Pain Pain Assessment: Faces Faces Pain Scale: Hurts a little bit Pain Location: abdominal incisions Pain Descriptors / Indicators: Grimacing;Guarding Pain Intervention(s): Monitored during session    Home Living                      Prior Function            PT Goals (current goals can now be found in the care plan section) Acute Rehab PT Goals PT Goal Formulation: With patient Time For Goal Achievement: 01/31/19 Potential to Achieve Goals: Good Progress towards PT goals: Progressing toward goals    Frequency    Min 3X/week      PT Plan Current plan remains appropriate    Co-evaluation              AM-PAC PT "6 Clicks" Mobility   Outcome Measure  Help needed turning from your back to your side while in a flat bed without using bedrails?: A Little Help needed moving from lying on your back to sitting on the side of a flat bed without using bedrails?: A Little Help needed moving to and from a bed to a  chair (including a wheelchair)?: A Little Help needed standing up from a chair using your arms (e.g., wheelchair or bedside chair)?: A Little Help needed to walk in hospital room?: A Little Help needed climbing 3-5 steps with a railing? : A Little 6 Click Score: 18    End of Session   Activity Tolerance: Patient tolerated treatment well;Patient limited by fatigue Patient left: Other (comment);with family/visitor present(sitting EOB) Nurse Communication: Mobility status PT Visit Diagnosis: Other abnormalities of gait and mobility (R26.89);Pain Pain - part of body: (abdomen)     Time: [id number] PT Time Calculation (min) (ACUTE ONLY): 33 min  Charges:  $Gait Training: 8-22 mins $Therapeutic Activity: 8-22 mins                     01/30/2019  Ginger Carne., PT Acute Rehabilitation Services [phone]   (pager) [phone]  (office)   Tessie Fass Honor [place]bank 01/30/2019, 1:41 PM

## 2019-01-30 NOTE — Progress Notes (Signed)
15 Days Post-Op   Subjective/Chief Complaint: TNA weaned to off.  Doing better with PO intake yesterday, but a little queasy this morning.     Objective: Vital signs in last 24 hours: Temp:  [98 F (36.7 C)-99.1 F (37.3 C)] 98.2 F (36.8 C) (12/30 0805) Pulse Rate:  [89-97] 92 (12/30 0805) Resp:  [19-20] 19 (12/30 0805) BP: (99-125)/(55-68) 120/65 (12/30 0805) SpO2:  [94 %-99 %] 94 % (12/30 0805) Last BM Date: 01/28/19  Intake/Output from previous day: 12/29 0701 - 12/30 0700 In: 500 [P.O.:500] Out: 850 [Urine:700; Drains:150] Intake/Output this shift: No intake/output data recorded.  PE: General appearance:alert, cooperative and no distress Resp:breathing comfortably DM:[id number], incisional tenderness; minimal bowel sounds; drains with cloudy output.  Drains 1 (left side) and 3 (right lateral) have minimal output, but still is cloudy.   Incisions c/d/i  Lab Results:  Recent Labs    01/28/19 0705 01/29/19 0542  WBC 13.5* 12.4*  HGB 9.4* 9.3*  HCT 30.4* 29.9*  PLT 356 342   BMET Recent Labs    01/28/19 0705 01/29/19 0542  NA 135 135  K 4.5 4.5  CL 100 103  CO2 23 24  GLUCOSE 180* 148*  BUN 22* 23*  CREATININE 0.67 0.68  CALCIUM 8.3* 8.3*   PT/INR No results for input(s): LABPROT, INR in the last 72 hours. ABG No results for input(s): PHART, HCO3 in the last 72 hours.  Invalid input(s): PCO2, PO2  Studies/Results: No results found.  Anti-infectives: Anti-infectives (From admission, onward)   Start     Dose/Rate Route Frequency Ordered Stop   01/29/19 1000  amoxicillin-clavulanate (AUGMENTIN) 875-125 MG per tablet 1 tablet     1 tablet Oral Every 12 hours 01/29/19 0902 02/02/19 0959   01/29/19 0000  amoxicillin-clavulanate (AUGMENTIN) 875-125 MG tablet     1 tablet Oral Every 12 hours 01/29/19 0924     01/19/19 1430  vancomycin (VANCOREADY) IVPB 1500 mg/300 mL  Status:  Discontinued     1,500 mg 150 mL/hr over 120 Minutes Intravenous Every 24  hours 01/19/19 1351 01/22/19 0952   01/19/19 1400  ceFEPIme (MAXIPIME) 2 g in sodium chloride 0.9 % 100 mL IVPB  Status:  Discontinued     2 g 200 mL/hr over 30 Minutes Intravenous Every 8 hours 01/19/19 1349 01/29/19 0902   01/15/19 2000  ceFAZolin (ANCEF) IVPB 2g/100 mL premix     2 g 200 mL/hr over 30 Minutes Intravenous Every 8 hours 01/15/19 1712 01/15/19 2052   01/15/19 0600  ceFAZolin (ANCEF) IVPB 2g/100 mL premix     2 g 200 mL/hr over 30 Minutes Intravenous On call to O.R. 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  pT2N1- 12/15  Drainswith highamylasex3.  Leave in place for now.  Drain teaching.   ABL anemia-Hgb stable Pancreatic leak DM- On resistant SSI and glargine. TRH following - appreciate assistance. Will need final information on amounts of lantus.  Leukocytosis/ID-afebrile, WBC continued to go down..CXR suspicious for pneumoniaandbibasilar atelectasis previously.Vanc/cefepime started12/20. D/c IV antibiotics, transition to oral for panc leak 12/29.  IR thoracentesis12/21, 12/27, cx NGTD, but pleural fluid c/w exudative effusion.   Vent dependent respiratory failure-extubated F/E/N- ileus appears to be resolved.  Diet as tolerated.  Calorie counts. TNA off.  Moderate protein calorie malnutrition.   Acidosis- resolved AKI- resolved Elevated LFTs- resolved R foot drop s/p epidural- worked with foot brace.  Dispo: Likely home tomorrow.  Have discussed with care management and [facility] Shelton Silvas of [facility].  Will get final insulin for discharge addressed.   Discussed with her PCP office. (He was out).  They are requested to get her an appointment next week to discuss blood sugars.      LOS: 15 days    Stark Klein 01/30/2019

## 2019-01-30 NOTE — Progress Notes (Signed)
Calorie Count Note  48 hour calorie count ordered.  Diet: Soft diet with thin liquids Supplements:   Glucerna Shake po BID, each supplement provides 220 kcal and 10 grams of protein.  Ensure Enlive po BID, each supplement provides 350 kcal and 20 grams of protein  Breakfast: 270 kcal, 13 grams of protein Lunch: 169 kcal, 8 grams of protein Dinner: 212 kcal, 12 grams of protein Supplements: 220 kcal, 10 grams of protein  Day 2 Total intake: 871 kcal (41% of minimum estimated needs)  43 grams of protein (39% of minimum estimated needs)  Estimated Nutritional Needs:  Kcal:  2100-2300 Protein:  110-125 grams Fluid:  2 L/day  Pt reports some nausea and is afraid to eat at meals. Meal completion has been varied from 20-80%. Pt knowledgeable on the importance of adequate caloric and protein needs. Pt reports more comfortability in po intake once discharged home. Per MD, possible plans for discharge tomorrow. Pt currently has Glucerna and Ensure ordered. Pt reports dislike of Ensure shakes however favors the Glucerna more. Educated pt to continue nutritional supplementation at home to aid in adequate nutrition. Educated on a high calorie, high protein diet for home. Educated on smaller frequent meals to prevent early satiety and nausea. Pt reports understanding of information discussed.   Nutrition Dx:  Increased nutrient needs related to post-op healing as evidenced by estimated needs; ongoing  Goal:  Pt to meet >/= 90% of their estimated nutrition needs; progressing  Intervention:   Discontinue calorie count  Discontinue Ensure due to poor po acceptance.  Provide Glucerna Shake po QID, each supplement provides 220 kcal and 10 grams of protein.  Encourage adequate PO intake.   Diet education regarding high calorie, high protein diet for home given and discussed.  Leah Parker, MS, RD, LDN Pager # [phone] After hours/ weekend pager # [phone]

## 2019-01-30 NOTE — Progress Notes (Signed)
Inpatient Diabetes Program Recommendations  AACE/ADA: New Consensus Statement on Inpatient Glycemic Control (2015)  Target Ranges:  Prepandial:   less than 140 mg/dL      Peak postprandial:   less than 180 mg/dL (1-2 hours)      Critically ill patients:  140 - 180 mg/dL   Lab Results  Component Value Date   GLUCAP 133 (H) 01/30/2019   HGBA1C 7.5 (H) 01/11/2019    Review of Glycemic Control Results for Leah Farmer, Leah Farmer (MRN [id number]) as of 01/30/2019 11:29  Ref. Range 01/29/2019 20:10 01/29/2019 23:54 01/30/2019 04:21 01/30/2019 08:01  Glucose-Capillary Latest Ref Range: 70 - 99 mg/dL 135 (H) 133 (H) 111 (H) 133 (H)    Current orders for Inpatient glycemic control: Lantus 25 units BID, Novolog 0-15 units TID, Novolog 0-5 units QHS  Inpatient Diabetes Program Recommendations:    Spoke with patient regarding diabetes and new use of insulin at discharge.  Reviewed patient's current A1c of 7.5%. Explained what a A1c is and what it measures. Also reviewed goal A1c with patient, importance of good glucose control @ home, and blood sugar goals. Reviewed patho of DM, need for insulin, differences between short acting vs long acting insulin, signs and symptoms of hyper vs hypo glycemia, survival skills, interventions, vascular changes, impact of Whipple to beta cells, and other commorbidities.  Patient will need an additional glucose meter at discharge. Glucose meter kit (includes lancets and strips) (#[id number]). Encouraged to check 3 times per day, especially since new to insulin. Reviewed how to document and stressed the importance of communicating with MD. Will provide information for outpatient endocrinology.  Additionally, given procedure, discussed the benefits of Freestyle Libre. Discussed application, cost, ease of checking CBGs and using to determine outcomes of CHO intake. Order provided for application. Patient wants to think about this as an option. Will plan to follow up in AM.   Briefly discussed intake of CHO, focus on plate method as able to tolerate and limiting items that are high in carbohydrates.   Reached out to RN to begin working with patient on self injections for insulin in preparation for discharge.  After discussion patient and husband are preferring vial and syringe.    #[id number] Lantus 100 units/vial # [id number] Glucose meter (inlcudes lancets and strips)  # [id number] Insulin syringes 0.5 ml  Thanks, Bronson Curb, MSN, RNC-OB Diabetes Coordinator [phone] (8a-5p)

## 2019-01-30 NOTE — TOC Initial Note (Signed)
Transition of Care (TOC) - Initial/Assessment Note  Marvetta Gibbons RN, BSN Transitions of Care Unit 4E- RN Case Manager [phone]   Patient Details  Name: Leah Farmer MRN: [id number] Date of Birth: Apr 25, 1965  Transition of Care [facility]) CM/SW Contact:    Dawayne Patricia, RN Phone Number: 01/30/2019, 4:47 PM  Clinical Narrative:                 Pt s/p Whipple, orders placed for HHRN/PT and DME RW and 3n1- CM spoke with pt at bedside along with spouse- list provided for [facility] needs Per CMS guidelines from medicare.gov website with star ratings (copy placed in shadow chart)- per pt choice [facility] and [facility]- will have DME delivered to room prior to discharge. Address, phone # and PCP all verified with pt in epic. Spouse to transport home. Call made to [facility] with [facility] for DME needs- 3n1 and RW to be delivered prior to discharge. Call made to [facility] for [facility] referral- msg left with intake line - await return call to see if they can accept referral. CM will f/u in am in no return call prior to discharge.    Expected Discharge Plan: [place] Barriers to Discharge: No Barriers Identified   Patient Goals and CMS Choice Patient states their goals for this hospitalization and ongoing recovery are:: to be able to move around less fatigued CMS Medicare.gov Compare Post Acute Care list provided to:: Patient Choice offered to / list presented to : Patient  Expected Discharge Plan and Services Expected Discharge Plan: [place]   Discharge Planning Services: CM Consult Post Acute Care Choice: Durable Medical Equipment, Home Health Living arrangements for the past 2 months: [place]                 DME Arranged: 3-N-1, [place] rolling DME Agency: [employer] Date DME Agency Contacted: 01/30/19 Time DME Agency Contacted: 1600 Representative spoke with at DME  Agency: [place] Arranged: RN, PT          Prior Living Arrangements/Services Living arrangements for the past 2 months: Media with:: Spouse Patient language and need for interpreter reviewed:: Yes Do you feel safe going back to the place where you live?: Yes      Need for Family Participation in Patient Care: Yes (Comment) Care giver support system in place?: Yes (comment)   Criminal Activity/Legal Involvement Pertinent to Current Situation/Hospitalization: No - Comment as needed  Activities of Daily Living Home Assistive Devices/Equipment: Cane (specify quad or straight), Shower chair with back, CBG Meter, Eyeglasses ADL Screening (condition at time of admission) Patient's cognitive ability adequate to safely complete daily activities?: Yes Is the patient deaf or have difficulty hearing?: No Does the patient have difficulty seeing, even when wearing glasses/contacts?: No Does the patient have difficulty concentrating, remembering, or making decisions?: No Patient able to express need for assistance with ADLs?: Yes Does the patient have difficulty dressing or bathing?: No Independently performs ADLs?: Yes (appropriate for developmental age) Does the patient have difficulty walking or climbing stairs?: Yes Weakness of Legs: None Weakness of Arms/Hands: None  Permission Sought/Granted Permission sought to share information with : Investment banker, corporate granted to share info w AGENCY: [place] agency        Emotional Assessment Appearance:: Appears stated age Attitude/Demeanor/Rapport: Engaged Affect (typically observed): Appropriate, Pleasant Orientation: :  Oriented to Self, Oriented to Place, Oriented to  Time, Oriented to Situation Alcohol / Substance Use: Not Applicable Psych Involvement: No (comment)  Admission diagnosis:  Pancreatic cancer ([place]) [C25.9] Primary malignant neuroendocrine neoplasm of pancreas ([place])  [C7A.8] Patient Active Problem List   Diagnosis Date Noted  . OSA (obstructive sleep apnea)   . Pleural effusion, left   . Acute respiratory failure with hypoxia ([place])   . Diabetes mellitus type 2 in obese ([place]) 01/19/2019  . HCAP (healthcare-associated pneumonia) 01/19/2019  . Essential hypertension 01/19/2019  . Depression 01/19/2019  . Pancreatic cancer ([place]) 01/15/2019  . Primary malignant neuroendocrine neoplasm of pancreas ([place]) 01/15/2019  . Encounter for screening colonoscopy 10/07/2018  . Pancreatic tumor 10/07/2018  . Nausea & vomiting 07/08/2018  . Acute flank pain 07/08/2018  . Flank pain 07/08/2018  . COVID-19 virus infection 07/08/2018  . Nephrolithiasis 07/08/2018  . Hydronephrosis, left 07/08/2018  . Acute lower UTI 07/08/2018  . Tachycardia 07/08/2018  . CAP (community acquired pneumonia) 07/08/2018  . Hypokalemia 07/08/2018  . Abnormal liver function 07/08/2018   PCP:  Margy Clarks, NP Pharmacy:   [address], [place], [place] [address] [place] [address] Phone: [phone] Fax: [place] [address], [place] [address] [place] [address] Phone: [phone] Fax: [phone]     Social Determinants of Health (SDOH) Interventions    Readmission Risk Interventions No flowsheet data found.

## 2019-01-30 NOTE — Progress Notes (Addendum)
PROGRESS NOTE    Leah Farmer  DQQ:[id number] DOB: December 19, 1965 DOA: 01/15/2019 PCP: Margy Clarks, NP  Brief Narrative:  Leah Farmer is a 53 y.o. female, PMH of type II DM, HTN, GERD, anxiety and depression,NAFL, primary malignant neuroendocrine tumor of the pancreas x2, s/p diagnostic laparoscopy, classic pancreatico duodenectomy, distal pancreatectomy, placement of biliary and pancreatic duct stents by CCS on 12/15, TRH was consulted on 12/16 to assist with management of uncontrolled DM and pneumonia. Postop course complicated by postop ileus, sinus tachycardia, expected acute blood loss anemia and electrolyte abnormalities in addition to respiratory failure requiring short term intubation.  She is improved and is scheduled to be discharged tomorrow.  TRH is arranging for her to get insulin as she is insulin nave and we have written her for Solostar Lantus 25 units twice daily but she prefers of the insulin syringes which has now been written.  Diabetic education coordinator to assist with teaching and will need close PCP follow up   Assessment & Plan:   Principal Problem:   Primary malignant neuroendocrine neoplasm of pancreas [facility]) Active Problems:   Pancreatic tumor   Diabetes mellitus type 2 in obese ([place])   HCAP (healthcare-associated pneumonia)   Essential hypertension   Depression   Pleural effusion, left   Acute respiratory failure with hypoxia (HCC)   OSA (obstructive sleep apnea)  Diabetes mellitus, type 2, uncontrolled with hyperglycemia -Patient's A1C is 7.5% from 12/11. On Janumet XR as an outpatient.  -Now worsened in setting of pancreatectomy and TPN.  -Was on On Lantus 20 units BID, SSI and insulin in TPN.  -Blood sugar much better controlled.  -Patient was receiving 55 units of regular insulin in TPN. Received a total of 106 units including Lantus, SSI and Regular insulin within TPN yesterday. -Will write for Lantus 25 units BID given her that she is  Insulin Naive; Have written for the Lantus 25 units twice daily vials and syringes and Blood Glucose Meter and Pen Needles along with Continuous Glucose Monitor with Freestyle; Will not write for SSI and defer to PCP for further Insulin adjustments  -Pharmacy to adjust inulin in TPN and now off of it  -Once TPN weaned, will need to adjust Lantus and add mealtime coverage for insulin. Would aim for likely dose of Lantus 50-55 units (divided BID) in addition to meal coverage and SSI. -May need to be titrated up depending on fasting blood sugar; Have Decrease SSI to AC/HS and to a Moderate Scale  -Diabetes Coordinator since newly starting insulin; To have Diabetic Teaching -Patient to be D/C'd tomorrow per Surgery  -CBG's ranging from 111-155  Essential Hypertension -Continue Metoprolol Tartrate 25 mg pO BID, Lisinopril 20 mg po Daily and HCTZ 12.5 mg po Daily   Wheezing, improved  -No history of reactive airway/asthma/COPD. Wheezing resolved -Continue Xopenex q6hprn   Sinus tachycardia -Persistent. Patient is on Metoprolol 25 mg po BID. Likely multifactorial.  -Patient states she has a history of this chronically. -Can discontinue telemetry from this standpoint as needed -  Possible pneumonia -Chest x-ray concerning for possible pneumonia vs atelectasis. - Associated leukocytosis in setting of recent surgery in addition to acute respiratory failure requiring intubation. Now extubated.  -Repeat chest x-ray showing re-accumulating pleural effusion. Completed 8 days of cefepime. Repeat thoracentesis (12/27) not suggestive of infectious process. Cytology pending. -WBC is improving  -Continue incentive spirometer and PRN Xopenex -On Augmentin per Surgery   Acute respiratory failure with hypoxia -Likely secondary to possible Pneumonia/atelectasis/effusions.  -Required intubation  briefly from 12/21 to 12/22. Now on room air. -Concern for possible OSA now using CPAP. Patient is refusing  CPAP. Importance discussed with patient and voiced understanding but states she is claustrophobic and anxious. -Continue CPAP qhs and will need a Outpatient Sleep Study   Primary Malignant neuroendocrine pancreatic tumor Prolonged Ileus TPN, improved  -Patient is s/p pancreaticoduodenectomy and hepaticojejunostomy.  -Per general surgery  Acute Blood Loss Anemia; Normocytic Anemia  -Post op. Required 3 units of PRBC.  -Stable now as Hb/Hct is 9.3/29.9 -Continue to Monitor for S/Sx of Bleeding -Repeat CBC in AM   Leukocytosis -In setting of recent surgery, possible pneumonia.  -Blood cultures with no growth to date.  -Patient was started on Vancomycin and Cefepime empirically and transitioned to Cefepime and now Augmentin -CBC improving and WBC was 12.4  Acute Kidney Injury -Peak creatinine of 1.55. Baseline appears to be around 1. Resolved -Patient's BUN/Cr is now 23/0.68 -Avoid nephrotoxic medications, contrast dyes, hypotension and renally adjust medications Continue monitor and repeat CMP in the a.m.  Hypomagnesemia Hypophosphatemia -Replete Per pharmacy while on TPN -Not repeated but will need to repeat in AM   Metabolic Acidosis -Resolved. Patient's CO2 is now 24, AG is 8, and Chloride is 103   Prolonged QTc -Last EKG with normal QTc  Morbid Obesity -Estimated body mass index is 44.06 kg/m as calculated from the following:   Height as of this encounter: '5\' 5"'  (1.651 m).   Weight as of this encounter: 120.1 kg.  -Weight Loss and Dietary Counseling given -Nutritionist on board and doing a Calorie Count -Continue with Ensure Enlive 237 mL p.o. twice daily along with Glucerna  237 mils p.o. twice daily  Anxiety -Patient states she has panic attacks -Continue Clonazepam 0.5 mg po Dailyprn  Abnormal LFTs -Was transient in the setting of TPN -Have improved and have not been repeated since the  -Continue monitor and trend repeat CMP in the outpatient  setting  Right Foot Drop -Patient has a brace per primary team -Continue PT efforts  DVT prophylaxis: Enoxaparin 40 mg sq q24h Code Status: FULL CODE  Family Communication: No family present at bedside  Disposition Plan: Anticipate D/C Home tomorrow per Primary Team  Consultants:   [facility]   Procedures:   s/p diagnostic laparoscopy, classic pancreatico duodenectomy, distal pancreatectomy, placement of biliary and pancreatic duct stents by CCS on 12/15  Has 2 abdominal drains on the right and one on the left.  Right IJ central line and Foley catheter have been discontinued.   Antimicrobials:  Anti-infectives (From admission, onward)   Start     Dose/Rate Route Frequency Ordered Stop   01/29/19 1000  amoxicillin-clavulanate (AUGMENTIN) 875-125 MG per tablet 1 tablet     1 tablet Oral Every 12 hours 01/29/19 0902 02/02/19 0959   01/29/19 0000  amoxicillin-clavulanate (AUGMENTIN) 875-125 MG tablet     1 tablet Oral Every 12 hours 01/29/19 0924     01/19/19 1430  vancomycin (VANCOREADY) IVPB 1500 mg/300 mL  Status:  Discontinued     1,500 mg 150 mL/hr over 120 Minutes Intravenous Every 24 hours 01/19/19 1351 01/22/19 0952   01/19/19 1400  ceFEPIme (MAXIPIME) 2 g in sodium chloride 0.9 % 100 mL IVPB  Status:  Discontinued     2 g 200 mL/hr over 30 Minutes Intravenous Every 8 hours 01/19/19 1349 01/29/19 0902   01/15/19 2000  ceFAZolin (ANCEF) IVPB 2g/100 mL premix     2 g 200 mL/hr over 30 Minutes Intravenous  Every 8 hours 01/15/19 1712 01/15/19 2052   01/15/19 0600  ceFAZolin (ANCEF) IVPB 2g/100 mL premix     2 g 200 mL/hr over 30 Minutes Intravenous On call to O.R. 01/15/19 0549 01/15/19 1145     Subjective: Seen and examined at bedside and states she was nauseous a little and states she was afraid to eat. States she has some pain in her abdomen when she turns. No Lightheadedness or dizziness. BS doing fairly well. Still getting diabetic Insulin injection teaching.  Denies any  other complaints or concerns at this time.  Objective: Vitals:   01/29/19 2009 01/29/19 2353 01/30/19 0416 01/30/19 0805  BP: (!) 125/58 (!) 99/55 118/65 120/65  Pulse: 96 97 92 92  Resp:    19  Temp: 98.8 F (37.1 C) 98.9 F (37.2 C) 98.9 F (37.2 C) 98.2 F (36.8 C)  TempSrc: Oral Oral Oral Oral  SpO2:    94%  Weight:      Height:        Intake/Output Summary (Last 24 hours) at 01/30/2019 1011 Last data filed at 01/30/2019 0700 Gross per 24 hour  Intake 240 ml  Output 707.5 ml  Net -467.5 ml   Filed Weights   01/15/19 0612 01/21/19 1004 01/22/19 0318  Weight: 114.3 kg 117.3 kg 120.1 kg   Examination: Physical Exam:  Constitutional: WN/WD morbidly obese Caucasian female currently in NAD and appears calm Eyes: Lids and conjunctivae normal, sclerae anicteric  ENMT: External Ears, Nose appear normal. Grossly normal hearing.  Neck: Appears normal, supple, no cervical masses, normal ROM, no appreciable thyromegaly; No JVD Respiratory: Slightly diminished to auscultation bilaterally, no wheezing, rales, rhonchi or crackles. Normal respiratory effort and patient is not tachypenic. No accessory muscle use.  Unlabored breathing Cardiovascular: RRR, No extremity edema.  Abdomen: Soft, mildly tender, Distended. No masses palpated. Bowel sounds positive.  GU: Deferred. Musculoskeletal: No clubbing / cyanosis of digits/nails. No joint deformity upper and lower extremities.  Skin: No rashes, lesions, ulcers on limited skin evaluation. No induration; Warm and dry.  Neurologic: CN 2-12 grossly intact with no focal deficits. Romberg sign and cerebellar reflexes not assessed.  Psychiatric: Normal judgment and insight. Alert and oriented x 3. Normal mood and appropriate affect.   Data Reviewed: I have personally reviewed following labs and imaging studies  CBC: Recent Labs  Lab 01/25/19 0433 01/26/19 1159 01/27/19 0742 01/28/19 0705 01/29/19 0542  WBC 17.8* 20.7* 17.3* 13.5*  12.4*  NEUTROABS  --   --   --  9.0*  --   HGB 8.9* 9.7* 9.2* 9.4* 9.3*  HCT 27.9* 31.7* 29.6* 30.4* 29.9*  MCV 91.5 93.5 94.0 93.3 93.4  PLT 296 308 331 356 859   Basic Metabolic Panel: Recent Labs  Lab 01/24/19 0200 01/25/19 0433 01/26/19 1159 01/27/19 0742 01/28/19 0705 01/29/19 0542  NA 145 145 136 137 135 135  K 3.7 3.9 4.8 4.5 4.5 4.5  CL 114* 111 104 104 100 103  CO2 21* '23 24 23 23 24  ' GLUCOSE 219* 160* 189* 151* 180* 148*  BUN 23* 22* 21* 20 22* 23*  CREATININE 0.92 0.64 0.69 0.66 0.67 0.68  CALCIUM 8.2* 8.2* 8.1* 8.3* 8.3* 8.3*  MG 2.1 1.9 2.1 2.0 2.0  --   PHOS 1.8* 2.7 3.5 3.4 3.2  --    GFR: Estimated Creatinine Clearance: 106.7 mL/min (by C-G formula based on SCr of 0.68 mg/dL). Liver Function Tests: Recent Labs  Lab 01/24/19 0200 01/25/19 0433  AST  27 30  ALT 18 19  ALKPHOS 61 68  BILITOT 0.9 0.5  PROT 5.8* 5.5*  ALBUMIN 2.5* 2.2*   No results for input(s): LIPASE, AMYLASE in the last 168 hours. No results for input(s): AMMONIA in the last 168 hours. Coagulation Profile: No results for input(s): INR, PROTIME in the last 168 hours. Cardiac Enzymes: No results for input(s): CKTOTAL, CKMB, CKMBINDEX, TROPONINI in the last 168 hours. BNP (last 3 results) No results for input(s): PROBNP in the last 8760 hours. HbA1C: No results for input(s): HGBA1C in the last 72 hours. CBG: Recent Labs  Lab 01/29/19 1653 01/29/19 2010 01/29/19 2354 01/30/19 0421 01/30/19 0801  GLUCAP 148* 135* 133* 111* 133*   Lipid Profile: Recent Labs    01/29/19 0542  TRIG 130   Thyroid Function Tests: No results for input(s): TSH, T4TOTAL, FREET4, T3FREE, THYROIDAB in the last 72 hours. Anemia Panel: No results for input(s): VITAMINB12, FOLATE, FERRITIN, TIBC, IRON, RETICCTPCT in the last 72 hours. Sepsis Labs: No results for input(s): PROCALCITON, LATICACIDVEN in the last 168 hours.  Recent Results (from the past 240 hour(s))  Culture, respiratory  (non-expectorated)     Status: None   Collection Time: 01/21/19 10:32 AM   Specimen: Tracheal Aspirate; Respiratory  Result Value Ref Range Status   Specimen Description TRACHEAL ASPIRATE  Final   Special Requests NONE  Final   Gram Stain   Final    RARE WBC PRESENT, PREDOMINANTLY PMN RARE YEAST Performed at [facility], [place] [address] [place], [place] [address]    Culture FEW CANDIDA LUSITANIAE  Final   Report Status 01/24/2019 FINAL  Final  Culture, body fluid-bottle     Status: None   Collection Time: 01/21/19 10:39 AM   Specimen: Pleura  Result Value Ref Range Status   Specimen Description PLEURAL LEFT  Final   Special Requests NONE  Final   Culture   Final    NO GROWTH 5 DAYS Performed at [facility], [address] [place], [place] [address]    Report Status 01/26/2019 FINAL  Final  Gram stain     Status: None   Collection Time: 01/21/19 10:39 AM   Specimen: Pleura  Result Value Ref Range Status   Specimen Description PLEURAL LEFT  Final   Special Requests NONE  Final   Gram Stain   Final    FEW WBC PRESENT, PREDOMINANTLY PMN NO ORGANISMS SEEN Performed at [facility], [place] [address], [place], [place] [address]    Report Status 01/21/2019 FINAL  Final  Culture, respiratory (non-expectorated)     Status: None   Collection Time: 01/22/19  9:45 AM   Specimen: Tracheal Aspirate; Respiratory  Result Value Ref Range Status   Specimen Description TRACHEAL ASPIRATE  Final   Special Requests NONE  Final   Gram Stain NO WBC SEEN NO ORGANISMS SEEN   Final   Culture   Final    Consistent with normal respiratory flora. Performed at [facility], [place] [address], [place],  [address]    Report Status 01/24/2019 FINAL  Final  Culture, body fluid-bottle     Status: None (Preliminary result)   Collection Time: 01/27/19 11:15 AM   Specimen: Pleura  Result Value Ref Range Status   Specimen Description PLEURAL LEFT  Final   Special Requests  NONE  Final   Culture   Final    NO GROWTH 2 DAYS Performed at [facility], [address] [place], [place] [address]    Report Status PENDING  Incomplete  Gram stain     Status: None   Collection Time: 01/27/19 11:15 AM   Specimen: Pleura  Result Value Ref Range Status   Specimen Description PLEURAL LEFT  Final   Special Requests NONE  Final   Gram Stain   Final    RARE WBC PRESENT, PREDOMINANTLY MONONUCLEAR NO ORGANISMS SEEN Performed at [facility], [address] [place], [place] [address]    Report Status 01/27/2019 FINAL  Final     RN Pressure Injury Documentation:     Estimated body mass index is 44.06 kg/m as calculated from the following:   Height as of this encounter: '5\' 5"'  (1.651 m).   Weight as of this encounter: 120.1 kg.  Malnutrition Type:  Nutrition Problem: Increased nutrient needs Etiology: post-op healing   Malnutrition Characteristics:  Signs/Symptoms: estimated needs   Nutrition Interventions:  Interventions: TPN   Radiology Studies: No results found.  Scheduled Meds: . sodium chloride   Intravenous Once  . amoxicillin-clavulanate  1 tablet Oral Q12H  . Chlorhexidine Gluconate Cloth  6 each Topical Daily  . enoxaparin (LOVENOX) injection  40 mg Subcutaneous Q24H  . feeding supplement (ENSURE ENLIVE)  237 mL Oral BID BM  . feeding supplement (GLUCERNA SHAKE)  237 mL Oral BID BM  . hydrochlorothiazide  12.5 mg Oral Daily  . insulin aspart  0-15 Units Subcutaneous TID WC  . insulin aspart  0-5 Units Subcutaneous QHS  . insulin glargine  25 Units Subcutaneous BID  . insulin starter kit- pen needles  1 kit Other Once  . lisinopril  20 mg Oral Daily  . metoprolol tartrate  25 mg Per Tube BID  . octreotide  200 mcg Subcutaneous Q12H  . pantoprazole  40 mg Oral Q1200   Continuous Infusions: . sodium chloride 50 mL/hr at 01/27/19 2355     LOS: 15 days   Kerney Elbe, DO [facility] PAGER is on AMION  If  7PM-7AM, please contact night-coverage [email or web address]

## 2019-01-31 LAB — GLUCOSE, CAPILLARY: Glucose-Capillary: 152 mg/dL — ABNORMAL HIGH (ref 70–99)

## 2019-01-31 NOTE — TOC Transition Note (Signed)
Transition of Care [facility]) - CM/SW Discharge Note Marvetta Gibbons RN,BSN Transitions of Care Unit 4NP (non trauma) - RN Case Manager [phone]   Patient Details  Name: Leah Farmer MRN: [id number] Date of Birth: July 06, 1965  Transition of Care [facility]) CM/SW Contact:  Dawayne Patricia, RN Phone Number: 01/31/2019, 11:58 AM   Clinical Narrative:    Pt stable for transition home today, DME has been delivered to room- 3n1 and RW. Spoke with Caryl Pina at [facility] for [facility] referral unfortunately they are not in-network with pt's insurance- call made to pt's second choice- [facility] [facility] who also are out of network with insurance- [facility] suggested maybe [facility] or [facility] may take [place] in [place]- spoke with pt and will try [facility]- call made to [facility] - spoke with Beverlee Nims- who states they do take BCBS plans- willing to accept referral pending verification of insurance- orders and paperwork faxed to [facility] via Epic to [phone]- [facility] can to start of care next week by Jan 7 which pt and spouse are agreeable with.    Final next level of care: [place] Barriers to Discharge: No Barriers Identified   Patient Goals and CMS Choice Patient states their goals for this hospitalization and ongoing recovery are:: to be able to move around less fatigued CMS Medicare.gov Compare Post Acute Care list provided to:: Patient Choice offered to / list presented to : Patient  Discharge Placement               Home with [facility]        Discharge Plan and Services   Discharge Planning Services: CM Consult Post Acute Care Choice: Durable Medical Equipment, Home Health          DME Arranged: 3-N-1, Walker rolling DME Agency: [facility] Date DME Agency Contacted: 01/30/19 Time DME Agency Contacted: 64 Representative spoke with at DME Agency: Thedore Mins HH Arranged: RN, PT [place] Agency: [facility] Date [place]: 01/31/19 Time [place]:  1030 Representative spoke with at [place]: [place] ([place]) Interventions     Readmission Risk Interventions Readmission Risk Prevention Plan 01/31/2019  Transportation Screening Complete  PCP or Specialist Appt within 3-5 Days Complete  HRI or [place] Complete  Social Work Consult for [place] Planning/Counseling [place] Not Applicable  Medication Review Press photographer) Complete  Some recent data might be hidden

## 2019-01-31 NOTE — Progress Notes (Signed)
Physical Therapy Treatment Patient Details Name: Leah Farmer MRN: [id number] DOB: 11-03-1965 Today's Date: 01/31/2019    History of Present Illness pt is a 53 y/o female with pmh significant for HTN, arthritis, DM admitted with primary malignant pancreatic tumors x2, s/p whipple procedure.    PT Comments    Pt has every thing ready for d/c from therapy perspective.  Education wrapped up.    Follow Up Recommendations  Home health PT;Supervision/Assistance - 24 hour     Equipment Recommendations  Rolling walker with 5" wheels;3in1 (PT)    Recommendations for Other Services       Precautions / Restrictions Precautions Precautions: Fall Precaution Comments: 3 drains    Mobility  Bed Mobility Overal bed mobility: Needs Assistance Bed Mobility: Rolling;Sidelying to Sit Rolling: Supervision Sidelying to sit: Supervision          Transfers Overall transfer level: Needs assistance Equipment used: Rolling walker (2 wheeled);None Transfers: Sit to/from Stand Sit to Stand: Supervision            Ambulation/Gait Ambulation/Gait assistance: Supervision Gait Distance (Feet): 30 Feet(x2) Assistive device: Rolling walker (2 wheeled) Gait Pattern/deviations: Step-through pattern Gait velocity: slow Gait velocity interpretation: <1.8 ft/sec, indicate of risk for recurrent falls General Gait Details: stable steppage gait due to no AFO at the time.   Stairs             Wheelchair Mobility    Modified Rankin (Stroke Patients Only)       Balance   Sitting-balance support: Feet supported Sitting balance-[name]: Good     Standing balance support: No upper extremity supported Standing balance-[name]: Fair Standing balance comment: released the RW to pull up pants.                            Cognition Arousal/Alertness: Awake/alert Behavior During Therapy: WFL for tasks assessed/performed Overall Cognitive Status: Within  Functional Limits for tasks assessed                                        Exercises      General Comments        Pertinent Vitals/Pain Pain Assessment: Faces Faces Pain Scale: Hurts a little bit Pain Location: incisions/drain sites Pain Descriptors / Indicators: Grimacing Pain Intervention(s): Monitored during session    Home Living                      Prior Function            PT Goals (current goals can now be found in the care plan section) Acute Rehab PT Goals PT Goal Formulation: With patient Time For Goal Achievement: 01/31/19 Potential to Achieve Goals: Good Progress towards PT goals: Progressing toward goals    Frequency    Min 3X/week      PT Plan Current plan remains appropriate    Co-evaluation              AM-PAC PT "6 Clicks" Mobility   Outcome Measure  Help needed turning from your back to your side while in a flat bed without using bedrails?: None Help needed moving from lying on your back to sitting on the side of a flat bed without using bedrails?: None Help needed moving to and from a bed to a chair (including a wheelchair)?: None Help needed  standing up from a chair using your arms (e.g., wheelchair or bedside chair)?: None Help needed to walk in hospital room?: None Help needed climbing 3-5 steps with a railing? : A Little 6 Click Score: 23    End of Session   Activity Tolerance: Patient tolerated treatment well;Patient limited by fatigue   Nurse Communication: Mobility status PT Visit Diagnosis: Other abnormalities of gait and mobility (R26.89);Pain Pain - part of body: (abdomen/drain sites)     Time: 1035-1105 PT Time Calculation (min) (ACUTE ONLY): 30 min  Charges:  $Gait Training: 8-22 mins $Therapeutic Activity: 8-22 mins                     01/31/2019  Ginger Carne., PT Acute Rehabilitation Services [phone]  (pager) [phone]  (office)   Tessie Fass Leonard Feigel 01/31/2019, 11:10  AM

## 2019-01-31 NOTE — Progress Notes (Signed)
16 Days Post-Op   Subjective/Chief Complaint: Off tpn, ambulating pain controlled, wants to go home no more nausea, having bowel function   Objective: Vital signs in last 24 hours: Temp:  [98.3 F (36.8 C)-99 F (37.2 C)] 98.3 F (36.8 C) (12/31 0800) Pulse Rate:  [89-101] 97 (12/31 0800) Resp:  [18-20] 18 (12/31 0800) BP: (95-122)/(52-76) 122/74 (12/31 0800) SpO2:  [95 %-97 %] 97 % (12/31 0800) Last BM Date: 01/31/19  Intake/Output from previous day: 12/30 0701 - 12/31 0700 In: 480 [P.O.:480] Out: 50 [Drains:50] Intake/Output this shift: No intake/output data recorded.  cv rrr Lungs clear Drains minimal output Abdomen soft approp tender bs present incision clean  Lab Results:  Recent Labs    01/29/19 0542  WBC 12.4*  HGB 9.3*  HCT 29.9*  PLT 342   BMET Recent Labs    01/29/19 0542  NA 135  K 4.5  CL 103  CO2 24  GLUCOSE 148*  BUN 23*  CREATININE 0.68  CALCIUM 8.3*   PT/INR No results for input(s): LABPROT, INR in the last 72 hours. ABG No results for input(s): PHART, HCO3 in the last 72 hours.  Invalid input(s): PCO2, PO2  Studies/Results: No results found.  Anti-infectives: Anti-infectives (From admission, onward)   Start     Dose/Rate Route Frequency Ordered Stop   01/29/19 1000  amoxicillin-clavulanate (AUGMENTIN) 875-125 MG per tablet 1 tablet     1 tablet Oral Every 12 hours 01/29/19 0902 02/02/19 0959   01/29/19 0000  amoxicillin-clavulanate (AUGMENTIN) 875-125 MG tablet     1 tablet Oral Every 12 hours 01/29/19 0924     01/19/19 1430  vancomycin (VANCOREADY) IVPB 1500 mg/300 mL  Status:  Discontinued     1,500 mg 150 mL/hr over 120 Minutes Intravenous Every 24 hours 01/19/19 1351 01/22/19 0952   01/19/19 1400  ceFEPIme (MAXIPIME) 2 g in sodium chloride 0.9 % 100 mL IVPB  Status:  Discontinued     2 g 200 mL/hr over 30 Minutes Intravenous Every 8 hours 01/19/19 1349 01/29/19 0902   01/15/19 2000  ceFAZolin (ANCEF) IVPB 2g/100 mL premix      2 g 200 mL/hr over 30 Minutes Intravenous Every 8 hours 01/15/19 1712 01/15/19 2052   01/15/19 0600  ceFAZolin (ANCEF) IVPB 2g/100 mL premix     2 g 200 mL/hr over 30 Minutes Intravenous On call to O.R. 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  pT2N1- 12/15  Drainswith highamylasex3.  Leave in place for now.  Drain teaching.   ABL anemia-Hgb stable Pancreatic leak DM-On resistant SSI and glargine.TRHfollowing and has written for insulin, f/u pcp next week Leukocytosis/ID-afebrile, WBC continued to go down..CXR suspicious for pneumoniaandbibasilar atelectasis previously.Vanc/cefepime started12/20. D/c IV antibiotics, transition to oral for panc leak 12/29.  IR thoracentesis12/21, 12/27, cx NGTD, but pleural fluid c/w exudative effusion.   Vent dependent respiratory failure-extubated F/E/N- ileus appears to be resolved.  Diet as tolerated.  Calorie counts. TNA off.  Moderate protein calorie malnutrition.   R foot drop s/p epidural- worked with foot brace.    Dispo: home today   Rolm Bookbinder 01/31/2019

## 2019-01-31 NOTE — Progress Notes (Signed)
PROGRESS NOTE    Leah Farmer  [id number] DOB: 10-25-1965 DOA: 01/15/2019 PCP: Margy Clarks, NP  Brief Narrative:  Leah Farmer is a 53 y.o. female, PMH of type II DM, HTN, GERD, anxiety and depression,NAFL, primary malignant neuroendocrine tumor of the pancreas x2, s/p diagnostic laparoscopy, classic pancreatico duodenectomy, distal pancreatectomy, placement of biliary and pancreatic duct stents by CCS on 12/15, TRH was consulted on 12/16 to assist with management of uncontrolled DM and pneumonia. Postop course complicated by postop ileus, sinus tachycardia, expected acute blood loss anemia and electrolyte abnormalities in addition to respiratory failure requiring short term intubation.   She is improved and is scheduled to be discharged later today .  TRH has arranged for her to get insulin as she is insulin nave and we have written her for Lantus vials 25 units twice daily as she prefers of the insulin syringes.  Diabetic education coordinator to assist with teaching and will need close PCP follow up. Medically stable to be D/C'd Home today.   Assessment & Plan:   Principal Problem:   Primary malignant neuroendocrine neoplasm of pancreas (HCC) Active Problems:   Pancreatic tumor   Diabetes mellitus type 2 in obese ([place])   HCAP (healthcare-associated pneumonia)   Essential hypertension   Depression   Pleural effusion, left   Acute respiratory failure with hypoxia (HCC)   OSA (obstructive sleep apnea)  Diabetes mellitus, type 2, uncontrolled with hyperglycemia, improved  -Patient's A1C is 7.5% from 12/11. On Janumet XR as an outpatient.  -Now worsened in setting of pancreatectomy and TPN.  -Was on On Lantus 20 units BID, SSI and insulin in TPN.  -Blood sugar much better controlled.  -Patient was receiving 55 units of regular insulin in TPN. Received a total of 106 units including Lantus, SSI and Regular insulin within TPN yesterday. -Will write for Lantus 25 units  BID given her that she is Insulin Naive; Have written for the Lantus 25 units twice daily vials and syringes and Blood Glucose Meter and Pen Needles along with Continuous Glucose Monitor with Freestyle; Will not write for SSI and defer to PCP for further Insulin adjustments  -Pharmacy to adjust inulin in TPN and now off of it  -Once TPN weaned, will need to adjust Lantus and add mealtime coverage for insulin. Would aim for likely dose of Lantus 50-55 units (divided BID) in addition to meal coverage and SSI. -May need to be titrated up depending on fasting blood sugar; Have Decrease SSI to AC/HS and to a Moderate Scale  -Diabetes Coordinator since newly starting insulin; To have Diabetic Teaching and she did well  -Patient to be D/C'd today per Surgery  -CBG's ranging from 111-180 -Will need close PCP follow up   Essential Hypertension -Continue Metoprolol Tartrate 25 mg pO BID, Lisinopril 20 mg po Daily and HCTZ 12.5 mg po Daily  -Follow up with PCP   Wheezing, improved  -No history of reactive airway/asthma/COPD. Wheezing resolved -Continue Xopenex q6hprn   Sinus tachycardia -Persistent. Patient is on Metoprolol 25 mg po BID. Likely multifactorial.  -Patient states she has a history of this chronically. -Can discontinue telemetry from this standpoint as needed -HR was improved and was Sinus in the 90's on Exam today   Possible pneumonia -Chest x-ray concerning for possible pneumonia vs atelectasis. - Associated leukocytosis in setting of recent surgery in addition to acute respiratory failure requiring intubation. Now extubated.  -Repeat chest x-ray showing re-accumulating pleural effusion. Completed 8 days of cefepime. Repeat thoracentesis (12/27)  not suggestive of infectious process. Cytology pending. -WBC is improving and last check was 12.4 -Continue incentive spirometer and PRN Xopenex -On Augmentin per Surgery  -Repeat CXR in 3-6 weeks   Acute Respiratory Failure with  Hypoxia -Likely secondary to possible Pneumonia/atelectasis/effusions.  -Required intubation briefly from 12/21 to 12/22. Now on room air. -Concern for possible OSA now using CPAP. Patient is refusing CPAP. Importance discussed with patient and voiced understanding but states she is claustrophobic and anxious. -Continue CPAP qhs and will need a Outpatient Sleep Study   Primary Malignant neuroendocrine pancreatic tumor Prolonged Ileus TPN, improved  -Patient is s/p pancreaticoduodenectomy and hepaticojejunostomy.  -Per General Surgery; ileus has improved and she is having Bowel Movements and passing Flatus   Acute Blood Loss Anemia; Normocytic Anemia  -Post op. Required 3 units of PRBC.  -Stable now as Hb/Hct is 9.3/29.9 on 01/29/2019 and has not been checked since  -Continue to Monitor for S/Sx of Bleeding -Repeat CBC in AM   Leukocytosis -In setting of recent surgery, possible pneumonia.  -Blood cultures with no growth to date.  -Patient was started on Vancomycin and Cefepime empirically and transitioned to Cefepime and now Augmentin -CBC improving and WBC was 12.4 on 01/29/2019 -Recommend repeating in 1 week with PCP   Acute Kidney Injury -Peak creatinine of 1.55. Baseline appears to be around 1. Resolved -Patient's BUN/Cr is now 23/0.68 on 01/29/2019  -Avoid nephrotoxic medications, contrast dyes, hypotension and renally adjust medications -Continue monitor and repeat CMP within 1 week   Hypomagnesemia Hypophosphatemia -Replete Per pharmacy while on TPN -Not repeated but will need to repeat in AM   Metabolic Acidosis -Resolved. Patient's CO2 is now 24, AG is 8, and Chloride is 103   Prolonged QTc -Last EKG with normal QTc  Morbid Obesity -Estimated body mass index is 44.06 kg/m as calculated from the following:   Height as of this encounter: 5\' 5"  (1.651 m).   Weight as of this encounter: 120.1 kg.  -Weight Loss and Dietary Counseling given -Nutritionist on  board and did a Calorie Count -Discontinued Ensure Enlive 237 mL p.o. twice daily given her poor p.o. acceptance -C/w  Glucerna  237 mils p.o. 4 times daily -Finished her calorie count yesterday and prescription is recommending encouraging adequate p.o. intake as well as diet education regarding high-calorie high-protein diet for home  Anxiety -Patient states she has panic attacks -Continue Clonazepam 0.5 mg po Dailyprn at Home   Abnormal LFTs -Was transient in the setting of TPN -Have improved and have not been repeated since the  -Continue monitor and trend repeat CMP in the outpatient setting  Right Foot Drop -Patient has a brace per primary team -Continue PT efforts  DVT prophylaxis: Enoxaparin 40 mg sq q24h Code Status: FULL CODE  Family Communication: No family present at bedside  Disposition Plan: Anticipate D/C Home today per Primary Team; TRH will sign of the Case  Consultants:   TRH   Procedures:   s/p diagnostic laparoscopy, classic pancreatico duodenectomy, distal pancreatectomy, placement of biliary and pancreatic duct stents by CCS on 12/15  Has 2 abdominal drains on the right and one on the left.  Right IJ central line and Foley catheter have been discontinued.   Antimicrobials:  Anti-infectives (From admission, onward)   Start     Dose/Rate Route Frequency Ordered Stop   01/29/19 1000  amoxicillin-clavulanate (AUGMENTIN) 875-125 MG per tablet 1 tablet     1 tablet Oral Every 12 hours 01/29/19 0902 02/02/19 [id number]  01/29/19 0000  amoxicillin-clavulanate (AUGMENTIN) 875-125 MG tablet     1 tablet Oral Every 12 hours 01/29/19 0924     01/19/19 1430  vancomycin (VANCOREADY) IVPB 1500 mg/300 mL  Status:  Discontinued     1,500 mg 150 mL/hr over 120 Minutes Intravenous Every 24 hours 01/19/19 1351 01/22/19 0952   01/19/19 1400  ceFEPIme (MAXIPIME) 2 g in sodium chloride 0.9 % 100 mL IVPB  Status:  Discontinued     2 g 200 mL/hr over 30 Minutes Intravenous  Every 8 hours 01/19/19 1349 01/29/19 0902   01/15/19 2000  ceFAZolin (ANCEF) IVPB 2g/100 mL premix     2 g 200 mL/hr over 30 Minutes Intravenous Every 8 hours 01/15/19 1712 01/15/19 2052   01/15/19 0600  ceFAZolin (ANCEF) IVPB 2g/100 mL premix     2 g 200 mL/hr over 30 Minutes Intravenous On call to O.R. 01/15/19 0549 01/15/19 1145     Subjective: Seen and examined at bedside and states he is doing well.  Still little afraid to eat and states that she has been "nibbling".  Nausea has resolved.  She is having bowel movements and passing flatus.  No other concerns complaints at this time and received diabetic teaching yesterday.  Scripts have been called to her pharmacy in [place] and patient to verify that they are there for her to pick up.  No other concerns or plans at this time and she is planning on being discharged today by the primary team.  Objective: Vitals:   01/31/19 0000 01/31/19 0400 01/31/19 0414 01/31/19 0800  BP: (!) 95/56  (!) 112/55 122/74  Pulse:    97  Resp: 18  20 18   Temp: 99 F (37.2 C) 99 F (37.2 C)  98.3 F (36.8 C)  TempSrc: Oral Oral  Oral  SpO2: 95%   97%  Weight:      Height:        Intake/Output Summary (Last 24 hours) at 01/31/2019 1048 Last data filed at 01/31/2019 0600 Gross per 24 hour  Intake 240 ml  Output 50 ml  Net 190 ml   Filed Weights   01/15/19 0612 01/21/19 1004 01/22/19 0318  Weight: 114.3 kg 117.3 kg 120.1 kg   Examination: Physical Exam:  Constitutional: WN/WD morbidly obese Caucasian female currently in NAD and appears calm  Eyes: Lids and conjunctivae normal, sclerae anicteric  ENMT: External Ears, Nose appear normal. Grossly normal hearing. Neck: Appears normal, supple, no cervical masses, normal ROM, no appreciable thyromegaly; no JVD Respiratory: Diminished to auscultation bilaterally slightly, no wheezing, rales, rhonchi or crackles. Normal respiratory effort and patient is not tachypenic. No accessory muscle use.    Cardiovascular: RRR, no murmurs / rubs / gallops. S1 and S2 auscultated. No extremity edema.  Abdomen: Soft, slightly tender, Distended due to body habitus.  Has 3 abdominal drains with 1 on the left side and 2 on the right and has incisional wound from her surgery which is clean dry and intact. Bowel sounds positive.  GU: Deferred. Musculoskeletal: No clubbing / cyanosis of digits/nails. No joint deformity upper and lower extremities..  Skin: No rashes, lesions, ulcers on a limited skin evaluation. No induration; Warm and dry.  Neurologic: CN 2-12 grossly intact with no focal deficits. Romberg sign and cerebellar reflexes not assessed.  Psychiatric: Normal judgment and insight. Alert and oriented x 3. Normal mood and appropriate affect.   Data Reviewed: I have personally reviewed following labs and imaging studies  CBC: Recent Labs  Lab 01/25/19 0433 01/26/19 1159 01/27/19 0742 01/28/19 0705 01/29/19 0542  WBC 17.8* 20.7* 17.3* 13.5* 12.4*  NEUTROABS  --   --   --  9.0*  --   HGB 8.9* 9.7* 9.2* 9.4* 9.3*  HCT 27.9* 31.7* 29.6* 30.4* 29.9*  MCV 91.5 93.5 94.0 93.3 93.4  PLT 296 308 331 356 [id number]   Basic Metabolic Panel: Recent Labs  Lab 01/25/19 0433 01/26/19 1159 01/27/19 0742 01/28/19 0705 01/29/19 0542  NA 145 136 137 135 135  K 3.9 4.8 4.5 4.5 4.5  CL 111 104 104 100 103  CO2 23 24 23 23 24   GLUCOSE 160* 189* 151* 180* 148*  BUN 22* 21* 20 22* 23*  CREATININE 0.64 0.69 0.66 0.67 0.68  CALCIUM 8.2* 8.1* 8.3* 8.3* 8.3*  MG 1.9 2.1 2.0 2.0  --   PHOS 2.7 3.5 3.4 3.2  --    GFR: Estimated Creatinine Clearance: 106.7 mL/min (by C-G formula based on SCr of 0.68 mg/dL). Liver Function Tests: Recent Labs  Lab 01/25/19 0433  AST 30  ALT 19  ALKPHOS 68  BILITOT 0.5  PROT 5.5*  ALBUMIN 2.2*   No results for input(s): LIPASE, AMYLASE in the last 168 hours. No results for input(s): AMMONIA in the last 168 hours. Coagulation Profile: No results for input(s): INR,  PROTIME in the last 168 hours. Cardiac Enzymes: No results for input(s): CKTOTAL, CKMB, CKMBINDEX, TROPONINI in the last 168 hours. BNP (last 3 results) No results for input(s): PROBNP in the last 8760 hours. HbA1C: No results for input(s): HGBA1C in the last 72 hours. CBG: Recent Labs  Lab 01/30/19 1137 01/30/19 1627 01/30/19 2024 01/30/19 2230 01/31/19 0840  GLUCAP 155* 174* 180* 169* 152*   Lipid Profile: Recent Labs    01/29/19 0542  TRIG 130   Thyroid Function Tests: No results for input(s): TSH, T4TOTAL, FREET4, T3FREE, THYROIDAB in the last 72 hours. Anemia Panel: No results for input(s): VITAMINB12, FOLATE, FERRITIN, TIBC, IRON, RETICCTPCT in the last 72 hours. Sepsis Labs: No results for input(s): PROCALCITON, LATICACIDVEN in the last 168 hours.  Recent Results (from the past 240 hour(s))  Culture, respiratory (non-expectorated)     Status: None   Collection Time: 01/22/19  9:45 AM   Specimen: Tracheal Aspirate; Respiratory  Result Value Ref Range Status   Specimen Description TRACHEAL ASPIRATE  Final   Special Requests NONE  Final   Gram Stain NO WBC SEEN NO ORGANISMS SEEN   Final   Culture   Final    Consistent with normal respiratory flora. Performed at [facility], [place] [address] [place], [place] [address]    Report Status 01/24/2019 FINAL  Final  Culture, body fluid-bottle     Status: None (Preliminary result)   Collection Time: 01/27/19 11:15 AM   Specimen: Pleura  Result Value Ref Range Status   Specimen Description PLEURAL LEFT  Final   Special Requests NONE  Final   Culture   Final    NO GROWTH 3 DAYS Performed at [facility] [address] [place], [place] [address]    Report Status PENDING  Incomplete  Gram stain     Status: None   Collection Time: 01/27/19 11:15 AM   Specimen: Pleura  Result Value Ref Range Status   Specimen Description PLEURAL LEFT  Final   Special Requests NONE  Final   Gram Stain   Final    RARE  WBC PRESENT, PREDOMINANTLY MONONUCLEAR NO ORGANISMS SEEN  Performed at [facility], [place] [address], [place], [place] [address]    Report Status 01/27/2019 FINAL  Final     RN Pressure Injury Documentation:     Estimated body mass index is 44.06 kg/m as calculated from the following:   Height as of this encounter: 5\' 5"  (1.651 m).   Weight as of this encounter: 120.1 kg.  Malnutrition Type:  Nutrition Problem: Increased nutrient needs Etiology: post-op healing   Malnutrition Characteristics:  Signs/Symptoms: estimated needs   Nutrition Interventions:  Interventions: TPN   Radiology Studies: No results found.  Scheduled Meds: . sodium chloride   Intravenous Once  . amoxicillin-clavulanate  1 tablet Oral Q12H  . Chlorhexidine Gluconate Cloth  6 each Topical Daily  . enoxaparin (LOVENOX) injection  40 mg Subcutaneous Q24H  . feeding supplement (GLUCERNA SHAKE)  237 mL Oral QID  . hydrochlorothiazide  12.5 mg Oral Daily  . insulin aspart  0-15 Units Subcutaneous TID WC  . insulin aspart  0-5 Units Subcutaneous QHS  . insulin glargine  25 Units Subcutaneous BID  . lisinopril  20 mg Oral Daily  . metoprolol tartrate  25 mg Per Tube BID  . octreotide  200 mcg Subcutaneous Q12H  . pantoprazole  40 mg Oral Q1200   Continuous Infusions: . sodium chloride 50 mL/hr at 01/27/19 2355    LOS: 16 days   Kerney Elbe, DO [facility] PAGER is on AMION  If 7PM-7AM, please contact night-coverage [email or web address]

## 2019-01-31 NOTE — Progress Notes (Signed)
Pt. Ate a small amount of her breakfast. Pt. Further educated on insulin use. Pt. Discharged at about 1000. All IV lines removed, and drainage devices left intact as ordered. Pt. Accompanied by husband and escorted out in a wheelchair with all belongings.

## 2019-02-01 LAB — CULTURE, BODY FLUID W GRAM STAIN -BOTTLE: Culture: NO GROWTH

## 2019-02-15 ENCOUNTER — Encounter (HOSPITAL_COMMUNITY): Payer: Self-pay | Admitting: General Surgery

## 2019-02-15 ENCOUNTER — Other Ambulatory Visit: Payer: Self-pay

## 2019-02-15 ENCOUNTER — Other Ambulatory Visit (HOSPITAL_COMMUNITY): Payer: Self-pay | Admitting: General Surgery

## 2019-02-15 ENCOUNTER — Ambulatory Visit (HOSPITAL_COMMUNITY)
Admission: RE | Admit: 2019-02-15 | Discharge: 2019-02-15 | Disposition: A | Discharge status: Home / Self Care | Payer: BC Managed Care – PPO | Source: Ambulatory Visit | Attending: General Surgery | Admitting: General Surgery

## 2019-02-15 ENCOUNTER — Other Ambulatory Visit: Payer: Self-pay | Admitting: General Surgery

## 2019-02-15 ENCOUNTER — Encounter (HOSPITAL_COMMUNITY): Payer: Self-pay

## 2019-02-15 ENCOUNTER — Ambulatory Visit (HOSPITAL_COMMUNITY)
Admission: RE | Admit: 2019-02-15 | Discharge: 2019-02-15 | Disposition: A | Discharge status: Home / Self Care | Payer: BC Managed Care – PPO | Source: Ambulatory Visit | Attending: Internal Medicine | Admitting: Internal Medicine

## 2019-02-15 ENCOUNTER — Inpatient Hospital Stay (HOSPITAL_COMMUNITY)
Admission: RE | Admit: 2019-02-15 | Discharge: 2019-02-19 | DRG: 371 | Disposition: A | Discharge status: Home / Self Care | Payer: BC Managed Care – PPO | Attending: General Surgery | Admitting: General Surgery

## 2019-02-15 DIAGNOSIS — Z79899 Other long term (current) drug therapy: Secondary | ICD-10-CM | POA: Diagnosis not present

## 2019-02-15 DIAGNOSIS — Z90411 Acquired partial absence of pancreas: Secondary | ICD-10-CM | POA: Diagnosis not present

## 2019-02-15 DIAGNOSIS — E1169 Type 2 diabetes mellitus with other specified complication: Secondary | ICD-10-CM | POA: Diagnosis not present

## 2019-02-15 DIAGNOSIS — K8689 Other specified diseases of pancreas: Secondary | ICD-10-CM

## 2019-02-15 DIAGNOSIS — I82432 Acute embolism and thrombosis of left popliteal vein: Secondary | ICD-10-CM | POA: Diagnosis present

## 2019-02-15 DIAGNOSIS — C7A8 Other malignant neuroendocrine tumors: Secondary | ICD-10-CM | POA: Diagnosis not present

## 2019-02-15 DIAGNOSIS — I10 Essential (primary) hypertension: Secondary | ICD-10-CM | POA: Diagnosis present

## 2019-02-15 DIAGNOSIS — Z8249 Family history of ischemic heart disease and other diseases of the circulatory system: Secondary | ICD-10-CM | POA: Diagnosis not present

## 2019-02-15 DIAGNOSIS — K651 Peritoneal abscess: Secondary | ICD-10-CM | POA: Diagnosis not present

## 2019-02-15 DIAGNOSIS — F410 Panic disorder [episodic paroxysmal anxiety] without agoraphobia: Secondary | ICD-10-CM | POA: Diagnosis present

## 2019-02-15 DIAGNOSIS — I82442 Acute embolism and thrombosis of left tibial vein: Secondary | ICD-10-CM | POA: Diagnosis present

## 2019-02-15 DIAGNOSIS — I2609 Other pulmonary embolism with acute cor pulmonale: Secondary | ICD-10-CM | POA: Diagnosis present

## 2019-02-15 DIAGNOSIS — M21371 Foot drop, right foot: Secondary | ICD-10-CM | POA: Diagnosis present

## 2019-02-15 DIAGNOSIS — F419 Anxiety disorder, unspecified: Secondary | ICD-10-CM | POA: Diagnosis not present

## 2019-02-15 DIAGNOSIS — J9601 Acute respiratory failure with hypoxia: Secondary | ICD-10-CM | POA: Diagnosis not present

## 2019-02-15 DIAGNOSIS — R188 Other ascites: Secondary | ICD-10-CM | POA: Diagnosis not present

## 2019-02-15 DIAGNOSIS — I82431 Acute embolism and thrombosis of right popliteal vein: Secondary | ICD-10-CM | POA: Diagnosis present

## 2019-02-15 DIAGNOSIS — Z6841 Body Mass Index (BMI) 40.0 and over, adult: Secondary | ICD-10-CM

## 2019-02-15 DIAGNOSIS — I824Y3 Acute embolism and thrombosis of unspecified deep veins of proximal lower extremity, bilateral: Secondary | ICD-10-CM | POA: Diagnosis not present

## 2019-02-15 DIAGNOSIS — E1165 Type 2 diabetes mellitus with hyperglycemia: Secondary | ICD-10-CM | POA: Diagnosis not present

## 2019-02-15 DIAGNOSIS — R112 Nausea with vomiting, unspecified: Secondary | ICD-10-CM | POA: Diagnosis not present

## 2019-02-15 DIAGNOSIS — E876 Hypokalemia: Secondary | ICD-10-CM | POA: Diagnosis present

## 2019-02-15 DIAGNOSIS — Z800 Family history of malignant neoplasm of digestive organs: Secondary | ICD-10-CM | POA: Diagnosis not present

## 2019-02-15 DIAGNOSIS — Z20822 Contact with and (suspected) exposure to covid-19: Secondary | ICD-10-CM | POA: Diagnosis present

## 2019-02-15 DIAGNOSIS — E872 Acidosis: Secondary | ICD-10-CM | POA: Diagnosis present

## 2019-02-15 DIAGNOSIS — I2602 Saddle embolus of pulmonary artery with acute cor pulmonale: Secondary | ICD-10-CM | POA: Diagnosis not present

## 2019-02-15 DIAGNOSIS — I82403 Acute embolism and thrombosis of unspecified deep veins of lower extremity, bilateral: Secondary | ICD-10-CM | POA: Diagnosis not present

## 2019-02-15 DIAGNOSIS — I82451 Acute embolism and thrombosis of right peroneal vein: Secondary | ICD-10-CM | POA: Diagnosis present

## 2019-02-15 DIAGNOSIS — I82452 Acute embolism and thrombosis of left peroneal vein: Secondary | ICD-10-CM | POA: Diagnosis present

## 2019-02-15 DIAGNOSIS — D649 Anemia, unspecified: Secondary | ICD-10-CM | POA: Diagnosis present

## 2019-02-15 DIAGNOSIS — F329 Major depressive disorder, single episode, unspecified: Secondary | ICD-10-CM | POA: Diagnosis not present

## 2019-02-15 DIAGNOSIS — D3A8 Other benign neuroendocrine tumors: Secondary | ICD-10-CM | POA: Diagnosis not present

## 2019-02-15 DIAGNOSIS — R000 Tachycardia, unspecified: Secondary | ICD-10-CM

## 2019-02-15 DIAGNOSIS — K760 Fatty (change of) liver, not elsewhere classified: Secondary | ICD-10-CM | POA: Diagnosis present

## 2019-02-15 DIAGNOSIS — Z794 Long term (current) use of insulin: Secondary | ICD-10-CM | POA: Diagnosis not present

## 2019-02-15 DIAGNOSIS — T8143XA Infection following a procedure, organ and space surgical site, initial encounter: Secondary | ICD-10-CM | POA: Diagnosis not present

## 2019-02-15 DIAGNOSIS — R52 Pain, unspecified: Secondary | ICD-10-CM | POA: Diagnosis not present

## 2019-02-15 DIAGNOSIS — Z8507 Personal history of malignant neoplasm of pancreas: Secondary | ICD-10-CM

## 2019-02-15 DIAGNOSIS — I82412 Acute embolism and thrombosis of left femoral vein: Secondary | ICD-10-CM | POA: Diagnosis present

## 2019-02-15 DIAGNOSIS — K219 Gastro-esophageal reflux disease without esophagitis: Secondary | ICD-10-CM | POA: Diagnosis present

## 2019-02-15 DIAGNOSIS — J90 Pleural effusion, not elsewhere classified: Secondary | ICD-10-CM | POA: Diagnosis present

## 2019-02-15 DIAGNOSIS — R638 Other symptoms and signs concerning food and fluid intake: Secondary | ICD-10-CM | POA: Diagnosis not present

## 2019-02-15 DIAGNOSIS — E860 Dehydration: Secondary | ICD-10-CM | POA: Diagnosis present

## 2019-02-15 DIAGNOSIS — K909 Intestinal malabsorption, unspecified: Secondary | ICD-10-CM | POA: Diagnosis not present

## 2019-02-15 DIAGNOSIS — E669 Obesity, unspecified: Secondary | ICD-10-CM | POA: Diagnosis not present

## 2019-02-15 DIAGNOSIS — M7989 Other specified soft tissue disorders: Secondary | ICD-10-CM | POA: Diagnosis not present

## 2019-02-15 DIAGNOSIS — I2699 Other pulmonary embolism without acute cor pulmonale: Secondary | ICD-10-CM | POA: Diagnosis not present

## 2019-02-15 LAB — SARS CORONAVIRUS 2 (TAT 6-24 HRS): SARS Coronavirus 2: NEGATIVE

## 2019-02-15 LAB — COMPREHENSIVE METABOLIC PANEL
ALT: 13 U/L (ref 0–44)
ALT: 11 U/L (ref 0–44)
AST: 15 U/L (ref 15–41)
AST: 13 U/L — ABNORMAL LOW (ref 15–41)
Albumin: 2.9 g/dL — ABNORMAL LOW (ref 3.5–5.0)
Albumin: 2.3 g/dL — ABNORMAL LOW (ref 3.5–5.0)
Alkaline Phosphatase: 81 U/L (ref 38–126)
Alkaline Phosphatase: 68 U/L (ref 38–126)
Anion gap: 18 — ABNORMAL HIGH (ref 5–15)
Anion gap: 16 — ABNORMAL HIGH (ref 5–15)
BUN: 18 mg/dL (ref 6–20)
BUN: 14 mg/dL (ref 6–20)
CO2: 28 mmol/L (ref 22–32)
CO2: 26 mmol/L (ref 22–32)
Calcium: 9.1 mg/dL (ref 8.9–10.3)
Calcium: 8.5 mg/dL — ABNORMAL LOW (ref 8.9–10.3)
Chloride: 95 mmol/L — ABNORMAL LOW (ref 98–111)
Chloride: 99 mmol/L (ref 98–111)
Creatinine, Ser: 1.01 mg/dL — ABNORMAL HIGH (ref 0.44–1.00)
Creatinine, Ser: 0.91 mg/dL (ref 0.44–1.00)
GFR calc Af Amer: >60 mL/min (ref >60)
GFR calc Af Amer: >60 mL/min (ref >60)
GFR calc non Af Amer: >60 mL/min (ref >60)
GFR calc non Af Amer: >60 mL/min (ref >60)
Glucose, Bld: 202 mg/dL — ABNORMAL HIGH (ref 70–99)
Glucose, Bld: 150 mg/dL — ABNORMAL HIGH (ref 70–99)
Potassium: 3 mmol/L — ABNORMAL LOW (ref 3.5–5.1)
Potassium: 2.4 mmol/L — CL (ref 3.5–5.1)
Sodium: 141 mmol/L (ref 135–145)
Sodium: 141 mmol/L (ref 135–145)
Total Bilirubin: 0.9 mg/dL (ref 0.3–1.2)
Total Bilirubin: 0.9 mg/dL (ref 0.3–1.2)
Total Protein: 8.4 g/dL — ABNORMAL HIGH (ref 6.5–8.1)
Total Protein: 7.1 g/dL (ref 6.5–8.1)

## 2019-02-15 LAB — CBC WITH DIFFERENTIAL/PLATELET
Abs Immature Granulocytes: 0.12 10*3/uL — ABNORMAL HIGH (ref 0.00–0.07)
Abs Immature Granulocytes: 0.1 10*3/uL — ABNORMAL HIGH (ref 0.00–0.07)
Basophils Absolute: 0.1 10*3/uL (ref 0.0–0.1)
Basophils Absolute: 0.1 10*3/uL (ref 0.0–0.1)
Basophils Relative: 0 %
Basophils Relative: 0 %
Eosinophils Absolute: 0 10*3/uL (ref 0.0–0.5)
Eosinophils Absolute: 0.1 10*3/uL (ref 0.0–0.5)
Eosinophils Relative: 0 %
Eosinophils Relative: 1 %
HCT: 37.4 % (ref 36.0–46.0)
HCT: 33.7 % — ABNORMAL LOW (ref 36.0–46.0)
Hemoglobin: 11.6 g/dL — ABNORMAL LOW (ref 12.0–15.0)
Hemoglobin: 10.4 g/dL — ABNORMAL LOW (ref 12.0–15.0)
Immature Granulocytes: 1 %
Immature Granulocytes: 1 %
Lymphocytes Relative: 9 %
Lymphocytes Relative: 17 %
Lymphs Abs: 1.2 10*3/uL (ref 0.7–4.0)
Lymphs Abs: 1.9 10*3/uL (ref 0.7–4.0)
MCH: 27.8 pg (ref 26.0–34.0)
MCH: 26.9 pg (ref 26.0–34.0)
MCHC: 31 g/dL (ref 30.0–36.0)
MCHC: 30.9 g/dL (ref 30.0–36.0)
MCV: 89.5 fL (ref 80.0–100.0)
MCV: 87.1 fL (ref 80.0–100.0)
Monocytes Absolute: 1 10*3/uL (ref 0.1–1.0)
Monocytes Absolute: 1.1 10*3/uL — ABNORMAL HIGH (ref 0.1–1.0)
Monocytes Relative: 7 %
Monocytes Relative: 9 %
Neutro Abs: 12 10*3/uL — ABNORMAL HIGH (ref 1.7–7.7)
Neutro Abs: 8.1 10*3/uL — ABNORMAL HIGH (ref 1.7–7.7)
Neutrophils Relative %: 83 %
Neutrophils Relative %: 72 %
Platelets: 438 10*3/uL — ABNORMAL HIGH (ref 150–400)
Platelets: 405 10*3/uL — ABNORMAL HIGH (ref 150–400)
RBC: 4.18 MIL/uL (ref 3.87–5.11)
RBC: 3.87 MIL/uL (ref 3.87–5.11)
RDW: 14.9 % (ref 11.5–15.5)
RDW: 14.7 % (ref 11.5–15.5)
WBC: 14.4 10*3/uL — ABNORMAL HIGH (ref 4.0–10.5)
WBC: 11.3 10*3/uL — ABNORMAL HIGH (ref 4.0–10.5)
nRBC: 0 % (ref 0.0–0.2)
nRBC: 0 % (ref 0.0–0.2)

## 2019-02-15 LAB — PROTIME-INR
INR: 1.3 — ABNORMAL HIGH (ref 0.8–1.2)
Prothrombin Time: 16.5 seconds — ABNORMAL HIGH (ref 11.4–15.2)

## 2019-02-15 LAB — LIPASE, BLOOD: Lipase: 24 U/L (ref 11–51)

## 2019-02-15 LAB — PREALBUMIN: Prealbumin: 8.9 mg/dL — ABNORMAL LOW (ref 18–38)

## 2019-02-15 LAB — PHOSPHORUS: Phosphorus: 2.6 mg/dL (ref 2.5–4.6)

## 2019-02-15 LAB — MAGNESIUM: Magnesium: 1.7 mg/dL (ref 1.7–2.4)

## 2019-02-15 LAB — GLUCOSE, CAPILLARY
Glucose-Capillary: 140 mg/dL — ABNORMAL HIGH (ref 70–99)
Glucose-Capillary: 94 mg/dL (ref 70–99)

## 2019-02-15 MED ORDER — THIAMINE HCL 100 MG/ML IJ SOLN
Freq: Once | INTRAVENOUS | Status: AC
  Filled 2019-02-15: qty 1000

## 2019-02-15 MED ORDER — ENOXAPARIN SODIUM 40 MG/0.4ML ~~LOC~~ SOLN
40.0000 mg | SUBCUTANEOUS | Status: DC
  Administered 2019-02-15 – 2019-02-18 (×4): 40 mg via SUBCUTANEOUS
  Filled 2019-02-15 (×4): qty 0.4

## 2019-02-15 MED ORDER — CLONAZEPAM 0.5 MG PO TABS
0.5000 mg | ORAL_TABLET | Freq: Every evening | ORAL | Status: DC | PRN
  Administered 2019-02-15 – 2019-02-17 (×3): 0.5 mg via ORAL
  Filled 2019-02-15 (×4): qty 1

## 2019-02-15 MED ORDER — PROCHLORPERAZINE MALEATE 10 MG PO TABS
10.0000 mg | ORAL_TABLET | Freq: Four times a day (QID) | ORAL | Status: DC | PRN
  Filled 2019-02-15: qty 1

## 2019-02-15 MED ORDER — VANCOMYCIN HCL 1250 MG/250ML IV SOLN
1250.0000 mg | INTRAVENOUS | Status: DC
  Administered 2019-02-16 – 2019-02-17 (×2): 1250 mg via INTRAVENOUS
  Filled 2019-02-15 (×3): qty 250

## 2019-02-15 MED ORDER — PROMETHAZINE HCL 25 MG PO TABS
12.5000 mg | ORAL_TABLET | ORAL | Status: DC | PRN
  Administered 2019-02-15: 14:00:00 12.5 mg via ORAL
  Filled 2019-02-15: qty 1

## 2019-02-15 MED ORDER — PIPERACILLIN-TAZOBACTAM 3.375 G IVPB
3.3750 g | Freq: Three times a day (TID) | INTRAVENOUS | Status: DC
  Administered 2019-02-15 – 2019-02-18 (×9): 3.375 g via INTRAVENOUS
  Filled 2019-02-15 (×9): qty 50

## 2019-02-15 MED ORDER — DIPHENHYDRAMINE HCL 12.5 MG/5ML PO ELIX: 12.5000 mg | ORAL_SOLUTION | Freq: Four times a day (QID) | ORAL | Status: DC | PRN

## 2019-02-15 MED ORDER — DIPHENHYDRAMINE HCL 50 MG/ML IJ SOLN: 12.5000 mg | Freq: Four times a day (QID) | INTRAMUSCULAR | Status: DC | PRN

## 2019-02-15 MED ORDER — KCL-LACTATED RINGERS-D5W 20 MEQ/L IV SOLN
INTRAVENOUS | Status: DC
  Filled 2019-02-15: qty 1000

## 2019-02-15 MED ORDER — SODIUM CHLORIDE 0.9 % IV BOLUS: 1000.0000 mL | Freq: Once | INTRAVENOUS | Status: DC

## 2019-02-15 MED ORDER — TRAMADOL HCL 50 MG PO TABS: 50.0000 mg | ORAL_TABLET | Freq: Four times a day (QID) | ORAL | Status: DC | PRN

## 2019-02-15 MED ORDER — PROMETHAZINE HCL 12.5 MG RE SUPP
12.5000 mg | RECTAL | Status: DC | PRN
  Filled 2019-02-15: qty 2

## 2019-02-15 MED ORDER — OXYCODONE HCL 5 MG PO TABS
5.0000 mg | ORAL_TABLET | ORAL | Status: DC | PRN
  Administered 2019-02-16: 10 mg via ORAL
  Filled 2019-02-15 (×2): qty 2

## 2019-02-15 MED ORDER — SIMETHICONE 80 MG PO CHEW: 40.0000 mg | CHEWABLE_TABLET | Freq: Four times a day (QID) | ORAL | Status: DC | PRN

## 2019-02-15 MED ORDER — ACETAMINOPHEN 325 MG PO TABS: 650.0000 mg | ORAL_TABLET | Freq: Four times a day (QID) | ORAL | Status: DC | PRN

## 2019-02-15 MED ORDER — MORPHINE SULFATE (PF) 2 MG/ML IV SOLN: 1.0000 mg | INTRAVENOUS | Status: DC | PRN

## 2019-02-15 MED ORDER — POTASSIUM CHLORIDE CRYS ER 20 MEQ PO TBCR
40.0000 meq | EXTENDED_RELEASE_TABLET | Freq: Once | ORAL | Status: AC
  Administered 2019-02-15: 40 meq via ORAL
  Filled 2019-02-15: qty 2

## 2019-02-15 MED ORDER — METOPROLOL TARTRATE 25 MG PO TABS
25.0000 mg | ORAL_TABLET | Freq: Two times a day (BID) | ORAL | Status: DC
  Administered 2019-02-15 – 2019-02-18 (×7): 25 mg via ORAL
  Filled 2019-02-15 (×7): qty 1

## 2019-02-15 MED ORDER — ACETAMINOPHEN 650 MG RE SUPP: 650.0000 mg | Freq: Four times a day (QID) | RECTAL | Status: DC | PRN

## 2019-02-15 MED ORDER — SODIUM CHLORIDE 0.9 % IV BOLUS
1000.0000 mL | Freq: Once | INTRAVENOUS | Status: AC
  Administered 2019-02-15: 1000 mL via INTRAVENOUS

## 2019-02-15 MED ORDER — ZOLPIDEM TARTRATE 5 MG PO TABS: 5.0000 mg | ORAL_TABLET | Freq: Every evening | ORAL | Status: DC | PRN

## 2019-02-15 MED ORDER — INSULIN GLARGINE 100 UNIT/ML ~~LOC~~ SOLN
15.0000 [IU] | Freq: Every day | SUBCUTANEOUS | Status: DC
  Administered 2019-02-15 – 2019-02-18 (×4): 15 [IU] via SUBCUTANEOUS
  Filled 2019-02-15 (×5): qty 0.15

## 2019-02-15 MED ORDER — PANTOPRAZOLE SODIUM 40 MG IV SOLR
40.0000 mg | Freq: Every day | INTRAVENOUS | Status: DC
  Administered 2019-02-15 – 2019-02-17 (×3): 40 mg via INTRAVENOUS
  Filled 2019-02-15 (×3): qty 40

## 2019-02-15 MED ORDER — PROCHLORPERAZINE EDISYLATE 10 MG/2ML IJ SOLN
5.0000 mg | Freq: Four times a day (QID) | INTRAMUSCULAR | Status: DC | PRN
  Administered 2019-02-15 – 2019-02-18 (×3): 10 mg via INTRAVENOUS
  Filled 2019-02-15 (×3): qty 2

## 2019-02-15 MED ORDER — INSULIN ASPART 100 UNIT/ML ~~LOC~~ SOLN
4.0000 [IU] | Freq: Three times a day (TID) | SUBCUTANEOUS | Status: DC
  Administered 2019-02-16 – 2019-02-19 (×7): 4 [IU] via SUBCUTANEOUS

## 2019-02-15 MED ORDER — INSULIN ASPART 100 UNIT/ML ~~LOC~~ SOLN
0.0000 [IU] | Freq: Three times a day (TID) | SUBCUTANEOUS | Status: DC
  Administered 2019-02-15: 2 [IU] via SUBCUTANEOUS
  Administered 2019-02-16: 3 [IU] via SUBCUTANEOUS
  Administered 2019-02-16 (×2): 2 [IU] via SUBCUTANEOUS
  Administered 2019-02-17 (×2): 3 [IU] via SUBCUTANEOUS
  Administered 2019-02-17: 2 [IU] via SUBCUTANEOUS
  Administered 2019-02-18: 3 [IU] via SUBCUTANEOUS
  Administered 2019-02-18 – 2019-02-19 (×3): 2 [IU] via SUBCUTANEOUS

## 2019-02-15 MED ORDER — THIAMINE HCL 100 MG/ML IJ SOLN
INTRAVENOUS | Status: DC
  Filled 2019-02-15 (×2): qty 1000

## 2019-02-15 MED ORDER — LACTATED RINGERS IV BOLUS: 500.0000 mL | Freq: Once | INTRAVENOUS | Status: DC

## 2019-02-15 MED ORDER — VANCOMYCIN HCL 2000 MG/400ML IV SOLN
2000.0000 mg | Freq: Once | INTRAVENOUS | Status: AC
  Administered 2019-02-15: 2000 mg via INTRAVENOUS
  Filled 2019-02-15: qty 400

## 2019-02-15 MED ORDER — THIAMINE HCL 100 MG/ML IJ SOLN: Freq: Once | INTRAVENOUS | Status: AC

## 2019-02-15 MED ORDER — POTASSIUM CHLORIDE 10 MEQ/100ML IV SOLN
10.0000 meq | INTRAVENOUS | Status: AC
  Administered 2019-02-15 (×2): 10 meq via INTRAVENOUS
  Filled 2019-02-15 (×2): qty 100

## 2019-02-15 MED ORDER — PIPERACILLIN-TAZOBACTAM 3.375 G IVPB: 3.3750 g | Freq: Three times a day (TID) | INTRAVENOUS | Status: DC

## 2019-02-15 NOTE — Progress Notes (Signed)
Labs for CBC, CMP, Lipase, Phosphorous and Magnesium were drawn. Patient received 1 L. of Banana bag followed by 1 L. bolus of normal saline as ordered by Stark Klein MD.  Phenergan 12.5 mg was also given for nausea.Tolerated wll, vitals stable, discharge instructions given, verbalized understanding. Patient alert, oriented and transported in a wheel at the time of discharge.

## 2019-02-15 NOTE — Discharge Instructions (Signed)

## 2019-02-15 NOTE — H&P (Signed)
Leah Farmer [id number]  Appointment: 02/15/2019 9:30 AM  Location: [facility]  Patient #: [id number]  DOB: [birth year]  Married / Language: Leah Farmer / Race: White  Female      History of Present Illness Stark Klein MD; 02/15/2019 10:26 AM)  The patient is a 54 year old female who presents for a follow-up for Pancreatic mass. Pt is a 54 yo F referred by Dr. 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 The patient came to medical attention this summer in June when she was found to have lots of obstructing kidney stones and flank pain. She was also found to have COVID 19. The tumor was not called on the renal protocol scan. The patient had non op therapy and passed the stones. She went for follow up at [facility] urology and had repeat CT. She was told that her pancreatic tumor was unchanged since 2017! She had not been aware of this. Her mother died of pancreatic cancer. She presented too late for surgery. The patient has subsequently had an EUS and biopsy. The tumor was seen to be neuroendocrine. It is well encapsulated and enhances as is typical. She does have long standing diabetes wtihout any significant change in blood sugars. She also has diarrhea since her gallbladder was removed (open) 30 years ago.       Pt underwent dx laparoscopy, whipple, and distal pancreatectomy 01/15/2019. Just prior to surgery, she had dotatate pet and was seen to also have distal pancreas neuroendocrine tumor. Both were removed and central pancreas was left. Her pancreas was very soft and duct was tiny. She did leak from her PJ. She required transfusion. She progressed to requiring insulin (was already diabetic). She also had a pneumonia and got a pleural effusion. She improved significantly over around 53 weeks and was discharged to home in improved condition.     She was doing well until around 5 days ago. She was eating  reasonably well and improving. She completed antibiotics. She has only been able to keep down a few sips of ginger ale over the last 3-4 days. She is complaining of muscle cramping. Her drains are working well. She denies fever.       pathology 01/15/2019  A. SOFT TISSUE, DIAPHRAGMATIC NODULE, BIOPSY:  - Fibrosis.  - No evidence of malignancy.    B. WHIPPLE PROCEDURE:  - Well-differentiated neuroendocrine tumor, 3 cm.  - Margins not involved.  - Tumor broadly less than 0.1 cm from posterior margin.  - Metastatic neuroendocrine tumor in one of ten lymph nodes (1/10).    C. PANCREAS, DISTAL, RESECTION:  - Well-differentiated neuroendocrine tumor, 1.4 cm.  - Margins not involved.  - See oncology table and comment.    D. LYMPH NODES, PORTAL, EXCISION:  - Three lymph nodes with no metastatic tumor identified (0/3).    ONCOLOGY TABLE:  PANCREAS (EXOCRINE):  Procedure: Pancreaticoduodenectomy and distal pancreatectomy.  Tumor Site: Uncinate process and pancreatic tail.  Tumor Size: 3.0 cm pancreatic head tumor. 1.4 cm pancreatic tail tumor.  Histologic Type: Neuroendocrine tumor.  Histologic Grade: WHO grade 1.  Tumor Extension: Confined to pancreas.  Margins: Uninvolved by tumor.  Treatment Effect: No known presurgical therapy.  Lymphovascular Invasion: Not identified.  Perineural Invasion: Present.  Regional Lymph Nodes:  Number of Lymph Nodes Involved: 1  Number of Lymph Nodes Examined: 13  Pathologic Stage Classification (pTNM, AJCC 8th Edition): pT2  (multfocal), pN1      Allergies (Chanel Teressa Senter, CMA; 02/15/2019  9:48 AM)  Cipro *FLUOROQUINOLONES*   Allergies Reconciled     Medication History (Chanel Teressa Senter, CMA; 02/15/2019 9:49 AM)  oxyCODONE HCl (5MG  Tablet, 1-2 Oral q 6 hours as needed, Taken starting 02/06/2019) Active.  clonazePAM (0.25MG  Tablet Disint, Oral) Active.  Lisinopril (2.5MG  Tablet, Oral) Active.  Metoprolol  Succinate (50MG  CP24 Sprinkle, Oral) Active.  Omeprazole (20MG  Tab DR Disint, Oral) Active.  Crestor (10MG  Tablet, Oral) Active.  Vitamin D (50000U Capsule, Oral) Active.  Insulin Aspart (100UNIT/ML Solution, Subcutaneous) Active.  Medications Reconciled        Review of Systems Stark Klein MD; 02/15/2019 10:26 AM)  All other systems negative    Vitals (Chanel Nolan CMA; 02/15/2019 9:50 AM)  02/15/2019 9:49 AM  Height: 64in  Temp.: 97.97F  Pulse: 134 (Regular)   BP: 140/86 (Sitting, Left Arm, Standard)              Physical Exam Stark Klein MD; 02/15/2019 10:27 AM)  General  Note: pt has good color      ENMT  Note: mucous membranes are dry.      Abdomen  Note: soft, non distended, non tender. drains all purulent (2 on right and 1 on left)          Assessment & Plan Stark Klein MD; 02/15/2019 10:29 AM)  PRIMARY MALIGNANT NEUROENDOCRINE NEOPLASM OF PANCREAS (C7A.8)  Impression: See below.    Will need follow up chromogranin and dotatate scans.   Nausea/vomiting/no PO intake. Tried outpatient fluids and labs.   However, CT shows LUQ fluid collection that is quite large.  Will ask IR to see and drain.  Will do NPO midnight.    Will get coags.  IV antibiotics for at least 24 hours.

## 2019-02-15 NOTE — Progress Notes (Signed)
Pharmacy Antibiotic Note  Consepcion Farmer is a 55 y.o. female admitted on 02/15/2019 with LUQ fluid collection after surgery for pancreatic mass in 12/20.  Pharmacy has been consulted for vancomycin and Zosyn dosing.  Pt was diagnosed with neuroendocrine tumor of the pancreas in 11/20. She underwent diagnostic laparoscopy, whipple and distal pancreatectomy 01/15/19. Pt has drainage catheters in place. CT today shows large LUQ fluid collection, with plan to have IR see pt and drain fluid collection. IV antibiotic therapy planned for at least 24 hrs, per surgeon note.  WBC 14.4, afebrile; Scr 1.01 (up from BL ~0.68 in 12/20); CrCl 76.2 ml/min  Plan: Zosyn 3.375 gm IV Q 8 hrs Vancomycin 2 grams IV X 1, followed by 1250 mg IV Q 24 hrs (estimated vancomycin AUC on this regimen, using Scr 1.01, is 474.5; goal vancomycin AUC is 400-550) Monitor WBC, temp, clinical improvement, renal function, vancomycin levels as indicated  Total Body Weight: 103.1 kg Height: 64 inches   Temp (24hrs), Avg:98.1 F (36.7 C), Min:97.6 F (36.4 C), Max:98.4 F (36.9 C)  Recent Labs  Lab 02/15/19 1123  WBC 14.4*  CREATININE 1.01*    Estimated Creatinine Clearance: 76.2 mL/min (A) (by C-G formula based on SCr of 1.01 mg/dL (H)).    Allergies  Allergen Reactions  . Ciprofloxacin Hives    Microbiology results: No cultures ordered this admission to date  Thank you for allowing pharmacy to be a part of this patient's care.  Gillermina Hu, PharmD, BCPS, [facility] Clinical Pharmacist 02/15/2019 5:56 PM

## 2019-02-16 ENCOUNTER — Inpatient Hospital Stay (HOSPITAL_COMMUNITY): Payer: BC Managed Care – PPO

## 2019-02-16 HISTORY — PX: IR CATHETER TUBE CHANGE: IMG717

## 2019-02-16 LAB — CBC
HCT: 31.7 % — ABNORMAL LOW (ref 36.0–46.0)
Hemoglobin: 9.8 g/dL — ABNORMAL LOW (ref 12.0–15.0)
MCH: 27.7 pg (ref 26.0–34.0)
MCHC: 30.9 g/dL (ref 30.0–36.0)
MCV: 89.5 fL (ref 80.0–100.0)
Platelets: 371 10*3/uL (ref 150–400)
RBC: 3.54 MIL/uL — ABNORMAL LOW (ref 3.87–5.11)
RDW: 14.9 % (ref 11.5–15.5)
WBC: 10.2 10*3/uL (ref 4.0–10.5)
nRBC: 0 % (ref 0.0–0.2)

## 2019-02-16 LAB — MAGNESIUM: Magnesium: 1.4 mg/dL — ABNORMAL LOW (ref 1.7–2.4)

## 2019-02-16 LAB — BASIC METABOLIC PANEL
Anion gap: 11 (ref 5–15)
BUN: 12 mg/dL (ref 6–20)
CO2: 25 mmol/L (ref 22–32)
Calcium: 7.9 mg/dL — ABNORMAL LOW (ref 8.9–10.3)
Chloride: 105 mmol/L (ref 98–111)
Creatinine, Ser: 0.89 mg/dL (ref 0.44–1.00)
GFR calc Af Amer: >60 mL/min (ref >60)
GFR calc non Af Amer: >60 mL/min (ref >60)
Glucose, Bld: 142 mg/dL — ABNORMAL HIGH (ref 70–99)
Potassium: 2.8 mmol/L — ABNORMAL LOW (ref 3.5–5.1)
Sodium: 141 mmol/L (ref 135–145)

## 2019-02-16 LAB — GLUCOSE, CAPILLARY
Glucose-Capillary: 150 mg/dL — ABNORMAL HIGH (ref 70–99)
Glucose-Capillary: 123 mg/dL — ABNORMAL HIGH (ref 70–99)
Glucose-Capillary: 157 mg/dL — ABNORMAL HIGH (ref 70–99)
Glucose-Capillary: 128 mg/dL — ABNORMAL HIGH (ref 70–99)

## 2019-02-16 LAB — PHOSPHORUS: Phosphorus: 2.5 mg/dL (ref 2.5–4.6)

## 2019-02-16 MED ORDER — ONDANSETRON HCL 4 MG/2ML IJ SOLN
INTRAMUSCULAR | Status: AC
  Filled 2019-02-16: qty 2

## 2019-02-16 MED ORDER — SODIUM CHLORIDE 0.9 % IV SOLN
INTRAVENOUS | Status: AC | PRN
  Administered 2019-02-16: 10 mL/h via INTRAVENOUS

## 2019-02-16 MED ORDER — POTASSIUM CHLORIDE CRYS ER 20 MEQ PO TBCR
40.0000 meq | EXTENDED_RELEASE_TABLET | Freq: Once | ORAL | Status: AC
  Administered 2019-02-16: 07:00:00 40 meq via ORAL
  Filled 2019-02-16: qty 2

## 2019-02-16 MED ORDER — KCL IN DEXTROSE-NACL 30-5-0.45 MEQ/L-%-% IV SOLN
INTRAVENOUS | Status: DC
  Filled 2019-02-16 (×5): qty 1000

## 2019-02-16 MED ORDER — MIDAZOLAM HCL 2 MG/2ML IJ SOLN
INTRAMUSCULAR | Status: AC | PRN
  Administered 2019-02-16: 1 mg via INTRAVENOUS

## 2019-02-16 MED ORDER — LIDOCAINE HCL (PF) 1 % IJ SOLN
INTRAMUSCULAR | Status: AC | PRN
  Administered 2019-02-16: 10 mL

## 2019-02-16 MED ORDER — POTASSIUM CHLORIDE 10 MEQ/100ML IV SOLN
10.0000 meq | INTRAVENOUS | Status: AC
  Administered 2019-02-16 (×4): 10 meq via INTRAVENOUS
  Filled 2019-02-16 (×3): qty 100

## 2019-02-16 MED ORDER — MIDAZOLAM HCL 2 MG/2ML IJ SOLN
INTRAMUSCULAR | Status: AC
  Filled 2019-02-16: qty 2

## 2019-02-16 MED ORDER — FENTANYL CITRATE (PF) 100 MCG/2ML IJ SOLN
INTRAMUSCULAR | Status: AC
  Filled 2019-02-16: qty 2

## 2019-02-16 MED ORDER — FENTANYL CITRATE (PF) 100 MCG/2ML IJ SOLN
INTRAMUSCULAR | Status: AC | PRN
  Administered 2019-02-16 (×2): 50 ug via INTRAVENOUS

## 2019-02-16 MED ORDER — IOHEXOL 300 MG/ML  SOLN
50.0000 mL | Freq: Once | INTRAMUSCULAR | Status: AC | PRN
  Administered 2019-02-16: 10 mL

## 2019-02-16 MED ORDER — LIDOCAINE HCL 1 % IJ SOLN
INTRAMUSCULAR | Status: AC
  Filled 2019-02-16: qty 20

## 2019-02-16 MED ORDER — MAGNESIUM SULFATE 50 % IJ SOLN
3.0000 g | Freq: Once | INTRAVENOUS | Status: AC
  Administered 2019-02-16: 13:00:00 3 g via INTRAVENOUS
  Filled 2019-02-16: qty 6

## 2019-02-16 MED ORDER — ONDANSETRON HCL 4 MG/2ML IJ SOLN
INTRAMUSCULAR | Status: AC | PRN
  Administered 2019-02-16: 4 mg via INTRAVENOUS

## 2019-02-16 NOTE — Progress Notes (Signed)
[facility] Office:  [phone] General Surgery Progress Note   LOS: 1 day  POD -     Chief Complaint: Abdominal pain  Assessment and Plan: 1.  Abscess/fluid collection LUQ  S/P Whipple - 01/15/2019 - [place] for neuroendocrine tumor, 1/10 nodes  Has 3 drains  WBC - 10,200 - 02/16/2019  On Zosyn  For IR drainage of LUQ fluid collection today  2. DM 3. Anemia   Hgb - 9.8 - 02/16/2019 4. Hypokalemia  K+ - 2.8 - 02/16/2019 5.  Low magnesium  To give IV 6.  Right foot drop - she is anxious about walking - uses walker - will get PT to see 7.  DVT prophylaxis - On hold   Active Problems:   Left upper quadrant abdominal abscess (HCC)  Subjective:  Seems comfortable.  Wants something to drink.  Anxious about walking because of right foot drop.  Objective:   Vitals:   02/16/19 0143 02/16/19 0550  BP: 130/76 112/63  Pulse: 96 (!) 102  Resp: 16 19  Temp: 97.7 F (36.5 C) 98.6 F (37 C)  SpO2: 91% 97%     Intake/Output from previous day:  01/15 0701 - 01/16 0700 In: 2167.1 [P.O.:100; I.V.:1623.2; IV Piggyback:443.9] Out: 826 [Urine:675; Drains:150; Stool:1]  Intake/Output this shift:  Total I/O In: -  Out: 75 [Drains:75]   Physical Exam:   General: Obese WF who is alert and oriented.    HEENT: Normal. Pupils equal. .   Lungs: Clear   Abdomen: Soft   Wound: Well healed chevron incision 3 abdominal drains - 1/2/3 - 50/0/25   Lab Results:    Recent Labs    02/15/19 1855 02/16/19 0436  WBC 11.3* 10.2  HGB 10.4* 9.8*  HCT 33.7* 31.7*  PLT 405* 371    BMET   Recent Labs    02/15/19 1855 02/16/19 0436  NA 141 141  K 2.4* 2.8*  CL 99 105  CO2 26 25  GLUCOSE 150* 142*  BUN 14 12  CREATININE 0.91 0.89  CALCIUM 8.5* 7.9*    PT/INR   Recent Labs    02/15/19 1855  LABPROT 16.5*  INR 1.3*    ABG  No results for input(s): PHART, HCO3 in the last 72 hours.  Invalid input(s): PCO2, PO2   Studies/Results:  CT ABDOMEN PELVIS WO  CONTRAST  Result Date: 02/15/2019 CLINICAL DATA:  Nausea, dry heaving, diarrhea, pancreatic cancer, status post Whipple, evaluate pancreatic fluid leak EXAM: CT ABDOMEN AND PELVIS WITHOUT CONTRAST TECHNIQUE: Multidetector CT imaging of the abdomen and pelvis was performed following the standard protocol without IV contrast. COMPARISON:  08/09/2018 FINDINGS: Lower chest: Small left pleural effusion and associated atelectasis or consolidation. Hepatobiliary: No focal liver abnormality is seen. Status post cholecystectomy. No biliary dilatation. Pancreas: Postoperative findings of Whipple pancreaticoduodenectomy with pancreatic ductal stent (series 2, image 35). Spleen: Normal in size without significant abnormality. Adrenals/Urinary Tract: Adrenal glands are unremarkable. Small nonobstructive left renal calculi bladder is unremarkable. Stomach/Bowel: Status post Whipple pancreaticoduodenectomy and gastrojejunostomy. Extensive thickening of the colon wall with adjacent fat stranding, which involves the length of the colon to the rectum. Vascular/Lymphatic: Scattered aortic atherosclerosis. No enlarged abdominal or pelvic lymph nodes. Reproductive: No mass or other significant abnormality. Other: Multiple surgical drainage catheters about the left and right abdomen, tips positioned in the vicinity of the duodenectomy site, about the gastric body, and anterior to the pancreatic tail. There is retroperitoneal fluid at the pancreatic head resection site anterior to the  IVC with an adjacent drainage catheter, collection measuring approximately 5.4 x 3.0 cm (series 2, image 43). There is loculated appearing air and fluid in the left upper quadrant about the spleen and below the left hemidiaphragm, largest component of collection measuring approximately 13.1 x 10.3 cm (series 2, image 22). Extensive fat stranding of the omentum and mesenteric fat (series 2, image 48). Musculoskeletal: No acute or significant osseous  findings. IMPRESSION: 1. Status post Whipple pancreaticoduodenectomy with pancreatic ductal stent in place. 2. Multiple surgical drainage catheters about the left and right abdomen, tips positioned in the vicinity of the duodenectomy site, about the gastric body, and anterior to the pancreatic tail. 3. There is retroperitoneal fluid at the pancreatic head resection site anterior to the IVC with an adjacent drainage catheter, collection measuring approximately 5.4 x 3.0 cm (series 2, image 43). 4. There is loculated appearing air and fluid in the left upper quadrant about the spleen and below the left hemidiaphragm, largest component of collection measuring approximately 13.1 x 10.3 cm (series 2, image 22). 5. Biliary leak or abscess are differential considerations given these postoperative findings. The presence or absence of infection is not established by CT. 6. Extensive thickening of the colon wall with adjacent fat stranding, which involves the length of the colon to the rectum, consistent with nonspecific infectious, inflammatory, or ischemic colitis. 7. Extensive fat stranding of the omentum and mesenteric fat. 8. Small left pleural effusion and associated atelectasis or consolidation. 9. Small nonobstructive left renal calculi. 10.  Aortic Atherosclerosis (ICD10-I70.0). These results will be called to the ordering clinician or representative by the Radiologist Assistant, and communication documented in the PACS or zVision Dashboard. Electronically Signed   By: Eddie Candle M.D.   On: 02/15/2019 16:04     Anti-infectives:   Anti-infectives (From admission, onward)   Start     Dose/Rate Route Frequency Ordered Stop   02/16/19 1830  vancomycin (VANCOREADY) IVPB 1250 mg/250 mL     1,250 mg 166.7 mL/hr over 90 Minutes Intravenous Every 24 hours 02/15/19 1813     02/15/19 2200  piperacillin-tazobactam (ZOSYN) IVPB 3.375 g  Status:  Discontinued     3.375 g 12.5 mL/hr over 240 Minutes Intravenous Every  8 hours 02/15/19 1750 02/15/19 1813   02/15/19 1830  piperacillin-tazobactam (ZOSYN) IVPB 3.375 g     3.375 g 12.5 mL/hr over 240 Minutes Intravenous Every 8 hours 02/15/19 1813     02/15/19 1830  vancomycin (VANCOREADY) IVPB 2000 mg/400 mL     2,000 mg 200 mL/hr over 120 Minutes Intravenous  Once 02/15/19 1813 02/15/19 2040      Alphonsa Overall, MD, [facility] Surgery Office: [phone] 02/16/2019

## 2019-02-16 NOTE — Progress Notes (Signed)
CRITICAL VALUE ALERT  Critical Value: K+ level 2.4  Date & Time Notied:02/15/19 1945  Provider Notified:Dr C. White  Orders Received/Actions taken: yes

## 2019-02-16 NOTE — H&P (Signed)
Chief Complaint: Patient was seen in consultation today for intra-abdominal fluid collection  Referring Physician(s): Dr. Barry Dienes  Supervising Physician: Arne Cleveland  Patient Status: [facility] - In-pt  History of Present Illness: Leah Farmer is a 54 y.o. 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  She underwent Whipple procedure, distal pancreatetomy 01/15/19.  At that time, 3 surgical drains were left in place.  She had a prolonged recovery complicated by respiratory failure, increased drain output (+ for amylase), and poor PO requiring TPN.  Ultimately she was discharged home improved but with drains in place.  She returned to the ED overnight with abdominal pain, nausea, vomiting.  She is found to have hypokalemia as well as a large LUQ fluid collection by CT.   CT Abdomen Pelvis 02/14/18: IMPRESSION: 1. Status post Whipple pancreaticoduodenectomy with pancreatic ductal stent in place. 2. Multiple surgical drainage catheters about the left and right abdomen, tips positioned in the vicinity of the duodenectomy site, about the gastric body, and anterior to the pancreatic tail. 3. There is retroperitoneal fluid at the pancreatic head resection site anterior to the IVC with an adjacent drainage catheter, collection measuring approximately 5.4 x 3.0 cm (series 2, image 43). 4. There is loculated appearing air and fluid in the left upper quadrant about the spleen and below the left hemidiaphragm, largest component of collection measuring approximately 13.1 x 10.3 cm (series 2, image 22). 5. Biliary leak or abscess are differential considerations given these postoperative findings. The presence or absence of infection is not established by CT. 6. Extensive thickening of the colon wall with adjacent fat stranding, which involves the length of the colon to the rectum, consistent with  nonspecific infectious, inflammatory, or ischemic colitis. 7. Extensive fat stranding of the omentum and mesenteric fat. 8. Small left pleural effusion and associated atelectasis or consolidation. 9. Small nonobstructive left renal calculi.  IR consulted for aspiration and drainage of intra-abdominal fluid collection. Case reviewed and approved by Dr. Vernard Gambles.   Patient assessed this AM.  She is stable, comfortable.  Husband at bedside.  She is updated on plan for drain repositioning today and is agreeable to proceed.  She is NPO.   Past Medical History:  Diagnosis Date  . Anxiety   . Arthritis 2020   lower back and hip; takes only OTC Motrin  . Depression    mild and situational; not medicated  . Diabetes mellitus without complication ([place])    unsure if type 1 or 2 , dx as an adult   . GERD (gastroesophageal reflux disease)   . History of kidney stones 07/2018   hospitalized at [facility]; passed on her own  . Hypertension   . NAFL (nonalcoholic fatty liver) 74/73/4037   fatty liver seen on CT scan abd/ pelvis  . Nephrolithiasis    lov in june 2020 , also had covid at this time   . pancreatic ca dx'd 08/2018   pancreatic  . PONV (postoperative nausea and vomiting)   . Tachycardia    generally runs in the 120s    Past Surgical History:  Procedure Laterality Date  . APPENDECTOMY  in 20s  . CHOLECYSTECTOMY  in 20s  . ESOPHAGOGASTRODUODENOSCOPY (EGD) WITH PROPOFOL N/A 11/01/2018   Procedure: ESOPHAGOGASTRODUODENOSCOPY (EGD) WITH PROPOFOL;  Surgeon: Milus Banister, MD;  Location: [facility];  Service: Endoscopy;  Laterality: N/A;  . EUS N/A 11/01/2018   Procedure: UPPER  ENDOSCOPIC ULTRASOUND (EUS) LINEAR;  Surgeon: Milus Banister, MD;  Location: [facility];  Service: Endoscopy;  Laterality: N/A;  . FINE NEEDLE ASPIRATION N/A 11/01/2018   Procedure: FINE NEEDLE ASPIRATION (FNA) LINEAR;  Surgeon: Milus Banister, MD;  Location: [facility];  Service: Endoscopy;  Laterality: N/A;    . IR THORACENTESIS ASP PLEURAL SPACE W/IMG GUIDE  01/21/2019  . LAPAROSCOPY N/A 01/15/2019   Procedure: LAPAROSCOPY DIAGNOSTIC;  Surgeon: Stark Klein, MD;  Location: [place];  Service: General;  Laterality: N/A;  . WHIPPLE PROCEDURE N/A 01/15/2019   Procedure: WHIPPLE PROCEDURE;  Surgeon: Stark Klein, MD;  Location: [place];  Service: General;  Laterality: N/A;    Allergies: Ciprofloxacin  Medications: Prior to Admission medications   Medication Sig Start Date End Date Taking? Authorizing Provider  blood glucose meter kit and supplies KIT Dispense based on patient and insurance preference. Use up to four times daily as directed. (FOR ICD-9 250.00, 250.01). 01/30/19  Yes Sheikh, Omair Latif, DO  clonazePAM (KLONOPIN) 0.5 MG tablet Take 0.25-0.5 mg by mouth daily as needed for anxiety.   Yes [provider]  feeding supplement, GLUCERNA SHAKE, (GLUCERNA SHAKE) LIQD Take 237 mLs by mouth 3 (three) times daily between meals. 01/29/19  Yes Stark Klein, MD  insulin glargine (LANTUS) 100 UNIT/ML injection Inject 0.25 mLs (25 Units total) into the skin 2 (two) times daily. 01/30/19  Yes Sheikh, Omair Latif, DO  Insulin Syringes, Disposable, U-100 1 ML MISC 1 Syringe by Does not apply route 2 (two) times daily. 01/30/19  Yes Sheikh, Omair Latif, DO  lisinopril-hydrochlorothiazide (ZESTORETIC) 20-12.5 MG tablet Take 1 tablet by mouth daily.   Yes [provider]  meclizine (ANTIVERT) 12.5 MG tablet Take 12.5 mg by mouth 3 (three) times daily as needed for dizziness.   Yes [provider]  Menthol, Topical Analgesic, (BIOFREEZE ROLL-ON EX) Apply 1 application topically 3 (three) times daily as needed (back pain.).   Yes [provider]  metoprolol succinate (TOPROL-XL) 50 MG 24 hr tablet Take 50 mg by mouth at bedtime. Take with or immediately following a meal.    Yes [provider]  norgestrel-ethinyl estradiol (CRYSELLE-28) 0.3-30 MG-MCG tablet Take 1  tablet by mouth at bedtime.    Yes [provider]  omeprazole (PRILOSEC) 20 MG capsule Take 20 mg by mouth daily.   Yes [provider]  oxyCODONE (OXY IR/ROXICODONE) 5 MG immediate release tablet Take 1-2 tablets (5-10 mg total) by mouth every 6 (six) hours as needed for moderate pain, severe pain or breakthrough pain. 01/19/19  Yes Stark Klein, MD  promethazine (PHENERGAN) 12.5 MG tablet Take 12.5 mg by mouth every 6 (six) hours as needed. 02/13/19  Yes [provider]  rosuvastatin (CRESTOR) 10 MG tablet Take 10 mg by mouth at bedtime.   Yes [provider]  traMADol (ULTRAM) 50 MG tablet Take 50 mg by mouth 2 (two) times daily as needed (kidney stone pain).  07/21/18  Yes [provider]  Vitamin D, Ergocalciferol, (DRISDOL) 1.25 MG (50000 UT) CAPS capsule Take 50,000 Units by mouth every Saturday.   Yes [provider]  amoxicillin-clavulanate (AUGMENTIN) 875-125 MG tablet Take 1 tablet by mouth 2 (two) times daily. 02/15/19   [provider]  pantoprazole (PROTONIX) 40 MG tablet Take 1 tablet (40 mg total) by mouth daily at 12 noon. Patient not taking: Reported on 02/15/2019 01/29/19   Stark Klein, MD  prochlorperazine (COMPAZINE) 10 MG tablet Take 1 tablet (10  mg total) by mouth every 6 (six) hours as needed for nausea or vomiting (Use for nausea and / or vomiting unresolved with ondansetron (Zofran).). Patient not taking: Reported on 02/15/2019 01/19/19   Stark Klein, MD     Family History  Problem Relation Age of Onset  . Hypertension Mother   . Diabetes Mother   . Pancreatic cancer Mother   . Hypertension Father   . Diabetes Father   . Esophageal cancer Maternal Uncle        Passed at 42. Found colon cancer on colonoscopy at that time.   . Colon cancer Neg Hx   . Colon polyps Neg Hx     Social History   Socioeconomic History  . Marital status: Married    Spouse name: Not on file  . Number of children: Not on  file  . Years of education: Not on file  . Highest education level: Not on file  Occupational History  . Not on file  Tobacco Use  . Smoking status: Never Smoker  . Smokeless tobacco: Never Used  Substance and Sexual Activity  . Alcohol use: Never  . Drug use: Never  . Sexual activity: Not on file  Other Topics Concern  . Not on file  Social History Narrative  . Not on file   Social Determinants of Health   Financial Resource Strain:   . Difficulty of Paying Living Expenses: Not on file  Food Insecurity:   . Worried About Charity fundraiser in the Last Year: Not on file  . Ran Out of Food in the Last Year: Not on file  Transportation Needs:   . Lack of Transportation (Medical): Not on file  . Lack of Transportation (Non-Medical): Not on file  Physical Activity:   . Days of Exercise per Week: Not on file  . Minutes of Exercise per Session: Not on file  Stress:   . Feeling of Stress : Not on file  Social Connections:   . Frequency of Communication with Friends and Family: Not on file  . Frequency of Social Gatherings with Friends and Family: Not on file  . Attends Religious Services: Not on file  . Active Member of Clubs or Organizations: Not on file  . Attends Archivist Meetings: Not on file  . Marital Status: Not on file     Review of Systems: A 12 point ROS discussed and pertinent positives are indicated in the HPI above.  All other systems are negative.  Review of Systems  Constitutional: Negative for fatigue and fever.  Respiratory: Negative for cough and shortness of breath.   Cardiovascular: Negative for chest pain.  Gastrointestinal: Positive for abdominal pain, nausea and vomiting.  Musculoskeletal: Negative for back pain.  Psychiatric/Behavioral: Negative for behavioral problems and confusion.    Vital Signs: BP 114/75 (BP Location: Right Arm)   Pulse (!) 104   Temp 97.8 F (36.6 C) (Oral)   Resp 20   Ht '5\' 4"'  (1.626 m)   Wt 227 lb 4.7  oz (103.1 kg)   SpO2 94%   BMI 39.01 kg/m   Physical Exam Vitals and nursing note reviewed.  Constitutional:      General: She is not in acute distress.    Appearance: She is not ill-appearing.  HENT:     Mouth/Throat:     Mouth: Mucous membranes are moist.     Pharynx: Oropharynx is clear.  Cardiovascular:     Rate and Rhythm: Regular rhythm. Tachycardia present.  Pulmonary:     Effort: Pulmonary effort is normal. No respiratory distress.     Breath sounds: Normal breath sounds.  Abdominal:     General: Abdomen is flat. There is no distension.     Palpations: Abdomen is soft.     Tenderness: There is no abdominal tenderness.     Comments: 2 RLQ surgical drains in place with small amount of beige output.  1 left lateral drain in place with large amount of beige output.   Skin:    General: Skin is warm and dry.  Neurological:     General: No focal deficit present.     Mental Status: She is oriented to person, place, and time.  Psychiatric:        Mood and Affect: Mood normal.        Behavior: Behavior normal.        Thought Content: Thought content normal.        Judgment: Judgment normal.      MD Evaluation Airway: WNL Heart: WNL Abdomen: WNL ASA  Classification: 3 Mallampati/Airway Score: One   Imaging: CT ABDOMEN PELVIS WO CONTRAST  Result Date: 02/15/2019 CLINICAL DATA:  Nausea, dry heaving, diarrhea, pancreatic cancer, status post Whipple, evaluate pancreatic fluid leak EXAM: CT ABDOMEN AND PELVIS WITHOUT CONTRAST TECHNIQUE: Multidetector CT imaging of the abdomen and pelvis was performed following the standard protocol without IV contrast. COMPARISON:  08/09/2018 FINDINGS: Lower chest: Small left pleural effusion and associated atelectasis or consolidation. Hepatobiliary: No focal liver abnormality is seen. Status post cholecystectomy. No biliary dilatation. Pancreas: Postoperative findings of Whipple pancreaticoduodenectomy with pancreatic ductal stent (series  2, image 35). Spleen: Normal in size without significant abnormality. Adrenals/Urinary Tract: Adrenal glands are unremarkable. Small nonobstructive left renal calculi bladder is unremarkable. Stomach/Bowel: Status post Whipple pancreaticoduodenectomy and gastrojejunostomy. Extensive thickening of the colon wall with adjacent fat stranding, which involves the length of the colon to the rectum. Vascular/Lymphatic: Scattered aortic atherosclerosis. No enlarged abdominal or pelvic lymph nodes. Reproductive: No mass or other significant abnormality. Other: Multiple surgical drainage catheters about the left and right abdomen, tips positioned in the vicinity of the duodenectomy site, about the gastric body, and anterior to the pancreatic tail. There is retroperitoneal fluid at the pancreatic head resection site anterior to the IVC with an adjacent drainage catheter, collection measuring approximately 5.4 x 3.0 cm (series 2, image 43). There is loculated appearing air and fluid in the left upper quadrant about the spleen and below the left hemidiaphragm, largest component of collection measuring approximately 13.1 x 10.3 cm (series 2, image 22). Extensive fat stranding of the omentum and mesenteric fat (series 2, image 48). Musculoskeletal: No acute or significant osseous findings. IMPRESSION: 1. Status post Whipple pancreaticoduodenectomy with pancreatic ductal stent in place. 2. Multiple surgical drainage catheters about the left and right abdomen, tips positioned in the vicinity of the duodenectomy site, about the gastric body, and anterior to the pancreatic tail. 3. There is retroperitoneal fluid at the pancreatic head resection site anterior to the IVC with an adjacent drainage catheter, collection measuring approximately 5.4 x 3.0 cm (series 2, image 43). 4. There is loculated appearing air and fluid in the left upper quadrant about the spleen and below the left hemidiaphragm, largest component of collection  measuring approximately 13.1 x 10.3 cm (series 2, image 22). 5. Biliary leak or abscess are differential considerations given these postoperative findings. The presence or absence of infection is not established by CT. 6. Extensive thickening of the  colon wall with adjacent fat stranding, which involves the length of the colon to the rectum, consistent with nonspecific infectious, inflammatory, or ischemic colitis. 7. Extensive fat stranding of the omentum and mesenteric fat. 8. Small left pleural effusion and associated atelectasis or consolidation. 9. Small nonobstructive left renal calculi. 10.  Aortic Atherosclerosis (ICD10-I70.0). These results will be called to the ordering clinician or representative by the Radiologist Assistant, and communication documented in the PACS or zVision Dashboard. Electronically Signed   By: Eddie Candle M.D.   On: 02/15/2019 16:04   DG Chest 1 View  Result Date: 01/27/2019 CLINICAL DATA:  Post left-sided thoracentesis. EXAM: CHEST  1 VIEW COMPARISON:  Radiographs 01/26/2019 and 01/24/2019. CT 01/20/2019. FINDINGS: 1128 hours. The left pleural effusion is decreased in volume. There is mildly improved aeration of the left lung base. No pneumothorax. Right arm PICC projects to the superior cavoatrial junction. The heart size and mediastinal contours are stable. IMPRESSION: 1. Decreased left pleural effusion and improved aeration of the left lung base status post thoracentesis. 2. No pneumothorax seen. Electronically Signed   By: Richardean Sale M.D.   On: 01/27/2019 11:46   CT CHEST W CONTRAST  Result Date: 01/20/2019 CLINICAL DATA:  54 year old female with abdominal and pelvic distension, pain and nausea, status post Whipple procedure for pancreatic neuroendocrine tumor bone 01/15/2019. LEFT pleural effusion on chest radiograph. EXAM: CT CHEST, ABDOMEN, AND PELVIS WITH CONTRAST TECHNIQUE: Multidetector CT imaging of the chest, abdomen and pelvis was performed following the  standard protocol during bolus administration of intravenous contrast. CONTRAST:  165m OMNIPAQUE IOHEXOL 300 MG/ML  SOLN COMPARISON:  01/09/2019 PET CT and prior studies. 01/19/2019 chest radiograph FINDINGS: CT CHEST FINDINGS Cardiovascular: Heart size is normal. No thoracic aortic abnormality identified. No pericardial effusion. Mediastinum/Nodes: No enlarged mediastinal, hilar, or axillary lymph nodes. Thyroid gland, trachea, and esophagus demonstrate no significant findings. Lungs/Pleura: A moderate LEFT pleural effusion is noted with associated LEFT LOWER lobe atelectasis. Mild-to-moderate subsegmental atelectasis within the RIGHT LOWER lobe noted. Two small areas of ground-glass opacity within the RIGHT lung apex are noted (series for: Images 20 3-32) are nonspecific. There is no evidence of pneumothorax or RIGHT pleural effusion. Musculoskeletal: No acute or suspicious bony abnormalities are identified. CT ABDOMEN PELVIS FINDINGS Hepatobiliary: Probable mild hepatic steatosis identified. A stent/catheter within what appears to be RIGHT hepatic/CBD to the hepaticojejunostomy site noted. No biliary dilatation is identified. Patient is status post cholecystectomy. Pancreas: Pancreaticoduodenectomy changes noted with 2 percutaneous drains in this region. A 3.6 x 7.6 x 4.6 cm collection at the site of the resected pancreatic head is noted, without well-defined capsule. A small amount of adjacent fluid along the operative site is noted. No evidence of gas collection in this area. A stent/catheter in the remaining portion of the pancreas/pancreatic duct is noted. Spleen: No significant abnormality. Adrenals/Urinary Tract: The kidneys, adrenal glands and bladder are unremarkable. Stomach/Bowel: Mildly distended gas-filled loops of colon and small bowel are noted, likely ileus. No other significant abnormality identified. No definite extraluminal oral contrast identified. Vascular/Lymphatic: Aortic atherosclerosis.  No enlarged abdominal or pelvic lymph nodes. Reproductive: Uterus and bilateral adnexa are unremarkable. Other: A small amount of ascites within the pelvis is noted. Small foci of pneumoperitoneum are not unexpected postoperatively. Moderate subcutaneous edema along the LATERAL aspects of the abdomen pelvis, LEFT-greater-than-RIGHT noted. Musculoskeletal: No acute or suspicious bony abnormalities. Degenerative changes in the lumbar spine and RIGHT hip are again identified. IMPRESSION: 1. Pancreaticoduodenectomy and hepaticojejunostomy changes as described. 3.6 x  7.6 x 4.6 cm postoperative collection at the site of the resected pancreatic head without well-defined capsule or gas. Surgical drain/catheters as described. 2. Moderate LEFT pleural effusion with associated LEFT LOWER lobe atelectasis and mild to moderate RIGHT basilar atelectasis. 3. Mildly distended gas-filled loops of colon and small bowel-likely ileus. 4. Small amount of pelvic free fluid. Subcutaneous edema along the LATERAL abdomen and pelvis. 5. Two small ground-glass opacities within the RIGHT lung apex, nonspecific. 6. Probable mild hepatic steatosis Electronically Signed   By: Margarette Canada M.D.   On: 01/20/2019 10:42   CT ABDOMEN PELVIS W CONTRAST  Result Date: 01/20/2019 CLINICAL DATA:  54 year old female with abdominal and pelvic distension, pain and nausea, status post Whipple procedure for pancreatic neuroendocrine tumor bone 01/15/2019. LEFT pleural effusion on chest radiograph. EXAM: CT CHEST, ABDOMEN, AND PELVIS WITH CONTRAST TECHNIQUE: Multidetector CT imaging of the chest, abdomen and pelvis was performed following the standard protocol during bolus administration of intravenous contrast. CONTRAST:  151m OMNIPAQUE IOHEXOL 300 MG/ML  SOLN COMPARISON:  01/09/2019 PET CT and prior studies. 01/19/2019 chest radiograph FINDINGS: CT CHEST FINDINGS Cardiovascular: Heart size is normal. No thoracic aortic abnormality identified. No  pericardial effusion. Mediastinum/Nodes: No enlarged mediastinal, hilar, or axillary lymph nodes. Thyroid gland, trachea, and esophagus demonstrate no significant findings. Lungs/Pleura: A moderate LEFT pleural effusion is noted with associated LEFT LOWER lobe atelectasis. Mild-to-moderate subsegmental atelectasis within the RIGHT LOWER lobe noted. Two small areas of ground-glass opacity within the RIGHT lung apex are noted (series for: Images 20 3-32) are nonspecific. There is no evidence of pneumothorax or RIGHT pleural effusion. Musculoskeletal: No acute or suspicious bony abnormalities are identified. CT ABDOMEN PELVIS FINDINGS Hepatobiliary: Probable mild hepatic steatosis identified. A stent/catheter within what appears to be RIGHT hepatic/CBD to the hepaticojejunostomy site noted. No biliary dilatation is identified. Patient is status post cholecystectomy. Pancreas: Pancreaticoduodenectomy changes noted with 2 percutaneous drains in this region. A 3.6 x 7.6 x 4.6 cm collection at the site of the resected pancreatic head is noted, without well-defined capsule. A small amount of adjacent fluid along the operative site is noted. No evidence of gas collection in this area. A stent/catheter in the remaining portion of the pancreas/pancreatic duct is noted. Spleen: No significant abnormality. Adrenals/Urinary Tract: The kidneys, adrenal glands and bladder are unremarkable. Stomach/Bowel: Mildly distended gas-filled loops of colon and small bowel are noted, likely ileus. No other significant abnormality identified. No definite extraluminal oral contrast identified. Vascular/Lymphatic: Aortic atherosclerosis. No enlarged abdominal or pelvic lymph nodes. Reproductive: Uterus and bilateral adnexa are unremarkable. Other: A small amount of ascites within the pelvis is noted. Small foci of pneumoperitoneum are not unexpected postoperatively. Moderate subcutaneous edema along the LATERAL aspects of the abdomen pelvis,  LEFT-greater-than-RIGHT noted. Musculoskeletal: No acute or suspicious bony abnormalities. Degenerative changes in the lumbar spine and RIGHT hip are again identified. IMPRESSION: 1. Pancreaticoduodenectomy and hepaticojejunostomy changes as described. 3.6 x 7.6 x 4.6 cm postoperative collection at the site of the resected pancreatic head without well-defined capsule or gas. Surgical drain/catheters as described. 2. Moderate LEFT pleural effusion with associated LEFT LOWER lobe atelectasis and mild to moderate RIGHT basilar atelectasis. 3. Mildly distended gas-filled loops of colon and small bowel-likely ileus. 4. Small amount of pelvic free fluid. Subcutaneous edema along the LATERAL abdomen and pelvis. 5. Two small ground-glass opacities within the RIGHT lung apex, nonspecific. 6. Probable mild hepatic steatosis Electronically Signed   By: JMargarette CanadaM.D.   On: 01/20/2019 10:42  DG Chest Port 1 View  Result Date: 02/16/2019 CLINICAL DATA:  Tachycardia. EXAM: PORTABLE CHEST 1 VIEW COMPARISON:  One-view chest x-ray 01/27/2019 FINDINGS: The heart is enlarged, exaggerated by low lung volumes. Right lung is clear. Left basilar airspace disease is improved. Small left effusion is not excluded. Left upper quadrant drain is noted. IMPRESSION: 1. Improving left basilar airspace disease. 2. Low lung volumes. 3. No acute cardiopulmonary disease. Electronically Signed   By: San Morelle M.D.   On: 02/16/2019 09:27   DG CHEST PORT 1 VIEW  Result Date: 01/26/2019 CLINICAL DATA:  Pleural effusion and cough. EXAM: PORTABLE CHEST 1 VIEW COMPARISON:  01/24/2019 FINDINGS: Right-sided PICC line unchanged. Lungs are hypoinflated with stable moderate opacification over the left mid to lower lung likely moderate size effusion with associated basilar atelectasis. Infection in the left mid to lower lung is possible. Mild prominence of the perihilar markings which may be due to mild vascular congestion. Stable  cardiomegaly. Remainder of the exam is unchanged. IMPRESSION: 1. Stable opacification over the left mid to lower lung likely effusion with atelectasis, although infection in the left base is possible. 2.  Cardiomegaly and suggestion mild vascular congestion. Electronically Signed   By: Marin Olp M.D.   On: 01/26/2019 10:18   DG CHEST PORT 1 VIEW  Result Date: 01/24/2019 CLINICAL DATA:  Wheezing EXAM: PORTABLE CHEST 1 VIEW COMPARISON:  Portable exam levin 34 hours compared to 01/23/2019 FINDINGS: Tip of RIGHT arm PICC line projects over SVC. Interval removal of nasogastric tube. Enlargement of cardiac silhouette. Mediastinal contours and pulmonary vascularity normal. Persistent mild RIGHT basilar atelectasis. Increased atelectasis versus consolidation of LEFT lower lobe. Associated LEFT pleural effusion, increased. Upper lungs clear. No pneumothorax. IMPRESSION: Increased atelectasis versus consolidation of LEFT lower lobe and LEFT pleural effusion. Subsegmental atelectasis RIGHT base. Electronically Signed   By: Lavonia Dana M.D.   On: 01/24/2019 11:57   CXR  Result Date: 01/23/2019 CLINICAL DATA:  Status post Whipple procedure. EXAM: PORTABLE CHEST 1 VIEW COMPARISON:  01/22/2019 FINDINGS: The endotracheal tube has been removed. The NG tube is stable. The right PICC line is stable. Worsening aeration status post extubation with low lung volumes and worsening bibasilar atelectasis. Probable small left effusion. IMPRESSION: 1. Removal of endotracheal tube with slight worsening aeration and increased bibasilar atelectasis. 2. Persistent small left effusion. Electronically Signed   By: Marijo Sanes M.D.   On: 01/23/2019 07:50   DG CHEST PORT 1 VIEW  Result Date: 01/22/2019 CLINICAL DATA:  Intubation.  Status post Whipple procedure. EXAM: PORTABLE CHEST 1 VIEW COMPARISON:  01/21/2019. FINDINGS: Right PICC line noted with tip over SVC. Tracheostomy tube, NG tube in stable position. Stable cardiomegaly.  Diffuse left lung and right base infiltrates noted. Interim progression from prior exam. No significant pleural effusion. No pneumothorax. IMPRESSION: 1. Right PICC line noted with tip over superior vena cava. Endotracheal tube and NG tube in stable position. 2. Diffuse left lung and right base infiltrates, increased from prior exam. 3.  Stable cardiomegaly. Electronically Signed   By: Marcello Moores  Register   On: 01/22/2019 06:49   DG Chest Port 1 View  Result Date: 01/21/2019 CLINICAL DATA:  Left pleural effusion. Status post thoracentesis. EXAM: PORTABLE CHEST 1 VIEW 10:19 a.m. COMPARISON:  01/21/2019 at 3:13 a.m. FINDINGS: Endotracheal tube and NG tube appear unchanged. Significant decrease in the left pleural effusion with only a minimal effusion atelectasis remaining at the left lung base. No pneumothorax. Right lung is clear. Heart size and  vascularity are normal. IMPRESSION: 1. Marked decrease in the left pleural effusion. No pneumothorax. 2. Minimal residual atelectasis at the left lung base. Electronically Signed   By: Lorriane Shire M.D.   On: 01/21/2019 10:35   DG CHEST PORT 1 VIEW  Result Date: 01/21/2019 CLINICAL DATA:  Intubation and orogastric tube placement. EXAM: PORTABLE CHEST 1 VIEW COMPARISON:  Radiograph earlier this day. Chest CT yesterday. FINDINGS: Endotracheal tube tip 2.3 cm from the carina. Tip and side port of the enteric tube below the diaphragm in the stomach. Hazy left lung base opacity consistent with pleural effusion and atelectasis as seen on CT yesterday. Streaky right lung base atelectasis. No new airspace disease. No pneumothorax. Surgical drains in the upper abdomen. IMPRESSION: 1. Endotracheal tube tip 2.3 cm from the carina. Enteric tube tip and side-port in the stomach. 2. Hazy left lung base opacity consistent with pleural effusion and atelectasis as seen on CT. Electronically Signed   By: Keith Rake M.D.   On: 01/21/2019 03:24   DG CHEST PORT 1 VIEW  Result  Date: 01/21/2019 CLINICAL DATA:  Increased shortness of breath tonight. EXAM: PORTABLE CHEST 1 VIEW COMPARISON:  Chest CT yesterday. Radiograph 01/19/2019 FINDINGS: Left lung base opacity corresponding to pleural effusion and atelectasis on CT. This is not significantly changed since prior exams. No visualized pneumothorax. Right lung is hypo aerated with minor atelectasis in the lung base. Previous right central line has been removed. Unchanged heart size and mediastinal contours. No acute osseous abnormalities are seen. Surgical drain in the left upper quadrant. IMPRESSION: Left lung base opacity corresponding to pleural fluid and atelectasis on CT. Overall findings are not significantly changed. Minimal streaky right lower lobe atelectasis. Electronically Signed   By: Keith Rake M.D.   On: 01/21/2019 01:57   DG CHEST PORT 1 VIEW  Result Date: 01/19/2019 CLINICAL DATA:  Leukocytosis EXAM: PORTABLE CHEST 1 VIEW COMPARISON:  01/15/2019 FINDINGS: Interval removal of enteric tube. Right IJ central venous catheter remains in place with distal tip terminating at the level of the SVC. Stable heart size. Interval development of left basilar airspace opacity. Probable small left pleural effusion. Right lung is clear. No pneumothorax. Surgical drains noted in the left upper abdominal quadrant. IMPRESSION: 1. Interval development of left basilar airspace opacity, which may represent atelectasis versus pneumonia. 2. Probable small left pleural effusion. Electronically Signed   By: Davina Poke M.D.   On: 01/19/2019 12:23   DG Abd Portable 1V  Result Date: 01/22/2019 CLINICAL DATA:  Enteric tube placement EXAM: PORTABLE ABDOMEN - 1 VIEW COMPARISON:  01/20/2019 CT abdomen/pelvis FINDINGS: Enteric tube terminates in proximal stomach with side port in the gastric cardia region. Skin staples overlie the upper abdomen bilaterally. Surgical drains terminate over the medial left and upper right abdomen. No  evidence of pneumatosis or pneumoperitoneum. IMPRESSION: Enteric tube terminates in the proximal stomach. Electronically Signed   By: Ilona Sorrel M.D.   On: 01/22/2019 14:33   [place] EKG SITE RITE  Result Date: 01/21/2019 If Site Rite image not attached, placement could not be confirmed due to current cardiac rhythm.  IR THORACENTESIS ASP PLEURAL SPACE W/IMG GUIDE  Result Date: 01/21/2019 INDICATION: Respiratory failure, left-sided pleural effusion. Request diagnostic and therapeutic thoracentesis. EXAM: ULTRASOUND GUIDED LEFT THORACENTESIS MEDICATIONS: None. COMPLICATIONS: None immediate. Postprocedural chest x-ray negative for pneumothorax. PROCEDURE: An ultrasound guided thoracentesis was thoroughly discussed with the patient and questions answered. The benefits, risks, alternatives and complications were also discussed. The patient understands and  wishes to proceed with the procedure. Written consent was obtained. Ultrasound was performed to localize and mark an adequate pocket of fluid in the left chest. The area was then prepped and draped in the normal sterile fashion. 1% Lidocaine was used for local anesthesia. Under ultrasound guidance a 6 Fr Safe-T-Centesis catheter was introduced. Thoracentesis was performed. The catheter was removed and a dressing applied. FINDINGS: A total of approximately 300 mL of clear yellow fluid was removed. Samples were sent to the laboratory as requested by the clinical team. IMPRESSION: Successful ultrasound guided left thoracentesis yielding 300 mL of pleural fluid. Read by: Ascencion Dike PA-C Electronically Signed   By: Jacqulynn Cadet M.D.   On: 01/21/2019 10:39   US THORACENTESIS ASP PLEURAL SPACE W/IMG GUIDE  Result Date: 01/27/2019 INDICATION: Patient with history of pancreatic tumor, acute hypoxic respiratory failure, possible pneumonia and small left-sided pleural effusion. Request to IR for diagnostic and therapeutic thoracentesis. EXAM: ULTRASOUND GUIDED  LEFT THORACENTESIS MEDICATIONS: 10 mL 1% lidocaine COMPLICATIONS: None immediate. PROCEDURE: An ultrasound guided thoracentesis was thoroughly discussed with the patient and questions answered. The benefits, risks, alternatives and complications were also discussed. The patient understands and wishes to proceed with the procedure. Written consent was obtained. Ultrasound was performed to localize and mark an adequate pocket of fluid in the left chest. The area was then prepped and draped in the normal sterile fashion. 1% Lidocaine was used for local anesthesia. Under ultrasound guidance a 6 Fr Safe-T-Centesis catheter was introduced. Thoracentesis was performed. The catheter was removed and a dressing applied. FINDINGS: A total of approximately 300 mL of clear, yellow fluid was removed. Samples were sent to the laboratory as requested by the clinical team. IMPRESSION: Successful ultrasound guided left thoracentesis yielding 300 mL of pleural fluid. Read by Candiss Norse, PA-C Electronically Signed   By: Aletta Edouard M.D.   On: 01/27/2019 11:15    Labs:  CBC: Recent Labs    01/29/19 0542 02/15/19 1123 02/15/19 1855 02/16/19 0436  WBC 12.4* 14.4* 11.3* 10.2  HGB 9.3* 11.6* 10.4* 9.8*  HCT 29.9* 37.4 33.7* 31.7*  PLT 342 438* 405* 371    COAGS: Recent Labs    07/09/18 1037 01/11/19 1109 01/16/19 0610 02/15/19 1855  INR 1.0 0.9 1.3* 1.3*    BMP: Recent Labs    01/29/19 0542 02/15/19 1123 02/15/19 1855 02/16/19 0436  NA 135 141 141 141  K 4.5 3.0* 2.4* 2.8*  CL 103 95* 99 105  CO2 '24 28 26 25  ' GLUCOSE 148* 202* 150* 142*  BUN 23* '18 14 12  ' CALCIUM 8.3* 9.1 8.5* 7.9*  CREATININE 0.68 1.01* 0.91 0.89  GFRNONAA >60 >60 >60 >60  GFRAA >60 >60 >60 >60    LIVER FUNCTION TESTS: Recent Labs    01/24/19 0200 01/25/19 0433 02/15/19 1123 02/15/19 1855  BILITOT 0.9 0.5 0.9 0.9  AST '27 30 15 ' 13*  ALT '18 19 13 11  ' ALKPHOS 61 68 81 68  PROT 5.8* 5.5* 8.4* 7.1  ALBUMIN  2.5* 2.2* 2.9* 2.3*    TUMOR MARKERS: No results for input(s): AFPTM, CEA, CA199, CHROMGRNA in the last 8760 hours.  Assessment and Plan: Intra-abdominal fluid collection  Patient s/p Whipple and distal pancreatectomy 12/15. With persistent fluid collections requiring drainage x3 left in place after her initial surgery.  She returned to ED with poor PO, dehydration, hypokalemia.  CT shows a large LUQ fluid collection.  IR consulted for aspiration and drainage.  Dr. Vernard Gambles has reviewed  case and agrees to drain manipulation/repositioning. Would like to avoid additional drain placement if possible.   Patient and husband updated at bedside.  They are agreeable to procedure today.  She has been NPO.   Risks and benefits discussed with the patient including bleeding, infection, damage to adjacent structures, bowel perforation/fistula connection, and sepsis.  All of the patient's questions were answered, patient is agreeable to proceed. Consent signed and in chart.  Thank you for this interesting consult.  I greatly enjoyed meeting Deatra Mcmahen and look forward to participating in their care.  A copy of this report was sent to the requesting provider on this date.  Electronically Signed: Docia Barrier, PA 02/16/2019, 10:44 AM   I spent a total of 40 Minutes    in face to face in clinical consultation, greater than 50% of which was counseling/coordinating care for intra-abdominal fluid collection.

## 2019-02-16 NOTE — Procedures (Signed)
  Procedure: Exchange and reposition L lateral abd drain to 37f pigtail EBL:   minimal Complications:  none immediate  See full dictation in [facility].  Dillard Cannon MD Main # [phone] Pager  [phone]

## 2019-02-17 LAB — CBC WITH DIFFERENTIAL/PLATELET
Abs Immature Granulocytes: 0.14 10*3/uL — ABNORMAL HIGH (ref 0.00–0.07)
Basophils Absolute: 0.1 10*3/uL (ref 0.0–0.1)
Basophils Relative: 1 %
Eosinophils Absolute: 0.2 10*3/uL (ref 0.0–0.5)
Eosinophils Relative: 2 %
HCT: 31.8 % — ABNORMAL LOW (ref 36.0–46.0)
Hemoglobin: 9.9 g/dL — ABNORMAL LOW (ref 12.0–15.0)
Immature Granulocytes: 2 %
Lymphocytes Relative: 23 %
Lymphs Abs: 2 10*3/uL (ref 0.7–4.0)
MCH: 27.8 pg (ref 26.0–34.0)
MCHC: 31.1 g/dL (ref 30.0–36.0)
MCV: 89.3 fL (ref 80.0–100.0)
Monocytes Absolute: 0.9 10*3/uL (ref 0.1–1.0)
Monocytes Relative: 11 %
Neutro Abs: 5.4 10*3/uL (ref 1.7–7.7)
Neutrophils Relative %: 61 %
Platelets: 345 10*3/uL (ref 150–400)
RBC: 3.56 MIL/uL — ABNORMAL LOW (ref 3.87–5.11)
RDW: 15 % (ref 11.5–15.5)
WBC: 8.7 10*3/uL (ref 4.0–10.5)
nRBC: 0 % (ref 0.0–0.2)

## 2019-02-17 LAB — GLUCOSE, CAPILLARY
Glucose-Capillary: 168 mg/dL — ABNORMAL HIGH (ref 70–99)
Glucose-Capillary: 160 mg/dL — ABNORMAL HIGH (ref 70–99)
Glucose-Capillary: 125 mg/dL — ABNORMAL HIGH (ref 70–99)
Glucose-Capillary: 98 mg/dL (ref 70–99)

## 2019-02-17 LAB — BASIC METABOLIC PANEL
Anion gap: 8 (ref 5–15)
BUN: 10 mg/dL (ref 6–20)
CO2: 23 mmol/L (ref 22–32)
Calcium: 7.7 mg/dL — ABNORMAL LOW (ref 8.9–10.3)
Chloride: 107 mmol/L (ref 98–111)
Creatinine, Ser: 0.86 mg/dL (ref 0.44–1.00)
GFR calc Af Amer: >60 mL/min (ref >60)
GFR calc non Af Amer: >60 mL/min (ref >60)
Glucose, Bld: 153 mg/dL — ABNORMAL HIGH (ref 70–99)
Potassium: 3.4 mmol/L — ABNORMAL LOW (ref 3.5–5.1)
Sodium: 138 mmol/L (ref 135–145)

## 2019-02-17 MED ORDER — POTASSIUM CHLORIDE 20 MEQ PO PACK
20.0000 meq | PACK | Freq: Two times a day (BID) | ORAL | Status: DC
  Administered 2019-02-17: 20 meq via ORAL
  Filled 2019-02-17: qty 1

## 2019-02-17 MED ORDER — POTASSIUM CHLORIDE CRYS ER 20 MEQ PO TBCR
20.0000 meq | EXTENDED_RELEASE_TABLET | Freq: Two times a day (BID) | ORAL | Status: DC
  Administered 2019-02-18: 20 meq via ORAL
  Filled 2019-02-17 (×3): qty 1

## 2019-02-17 MED ORDER — HYDROCODONE-ACETAMINOPHEN 5-325 MG PO TABS: 1.0000 | ORAL_TABLET | ORAL | Status: DC | PRN

## 2019-02-17 MED ORDER — SODIUM CHLORIDE 0.9% FLUSH
5.0000 mL | Freq: Three times a day (TID) | INTRAVENOUS | Status: DC
  Administered 2019-02-17 – 2019-02-19 (×6): 5 mL

## 2019-02-17 NOTE — Plan of Care (Signed)
  Problem: Education: Goal: Knowledge of General Education information will improve Description Including pain rating scale, medication(s)/side effects and non-pharmacologic comfort measures Outcome: Progressing   

## 2019-02-17 NOTE — Progress Notes (Addendum)
[facility] Office:  [phone] General Surgery Progress Note   LOS: 2 days  POD -     Chief Complaint: Abdominal pain  Assessment and Plan: 1.  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 DM 3. Anemia   Hgb - 9.9 - 02/17/2019 4. Hypokalemia  K+ - 3.4 - 02/17/2019  Give oral potassium 5.  Low magnesium  Replaced 02/16/2019 6.  Right foot drop - she is anxious about walking - uses walker - will get PT to see  But was able to get OOB 7.  DVT prophylaxis - On lovenox   Active Problems:   Left upper quadrant abdominal abscess (HCC)  Subjective:  Feels better.  Tolerating liquids. Wants to try reg food.  Objective:   Vitals:   02/16/19 2200 02/17/19 0546  BP: 106/75 112/80  Pulse: 97 90  Resp: 18 18  Temp: 97.6 F (36.4 C) 98.3 F (36.8 C)  SpO2: 96% 96%     Intake/Output from previous day:  01/16 0701 - 01/17 0700 In: 405 [I.V.:375; IV Piggyback:25] Out: 365 [Drains:365]  Intake/Output this shift:  Total I/O In: 410 [P.O.:410] Out: -    Physical Exam:   General: Obese WF who is alert and oriented.    HEENT: Normal. Pupils equal. .   Lungs: Clear   Abdomen: Soft   Wound: Well healed chevron incision 3 abdominal drains - 1/2/3 - 310/5/50   Lab Results:    Recent Labs    02/16/19 0436 02/17/19 0344  WBC 10.2 8.7  HGB 9.8* 9.9*  HCT 31.7* 31.8*  PLT 371 345    BMET   Recent Labs    02/16/19 0436 02/17/19 0344  NA 141 138  K 2.8* 3.4*  CL 105 107  CO2 25 23  GLUCOSE 142* 153*  BUN 12 10  CREATININE 0.89 0.86  CALCIUM 7.9* 7.7*    PT/INR   Recent Labs    02/15/19 1855  LABPROT 16.5*  INR 1.3*    ABG  No results for input(s): PHART, HCO3 in the last 72 hours.  Invalid input(s): PCO2, PO2   Studies/Results:  CT ABDOMEN  PELVIS WO CONTRAST  Result Date: 02/15/2019 CLINICAL DATA:  Nausea, dry heaving, diarrhea, pancreatic cancer, status post Whipple, evaluate pancreatic fluid leak EXAM: CT ABDOMEN AND PELVIS WITHOUT CONTRAST TECHNIQUE: Multidetector CT imaging of the abdomen and pelvis was performed following the standard protocol without IV contrast. COMPARISON:  08/09/2018 FINDINGS: Lower chest: Small left pleural effusion and associated atelectasis or consolidation. Hepatobiliary: No focal liver abnormality is seen. Status post cholecystectomy. No biliary dilatation. Pancreas: Postoperative findings of Whipple pancreaticoduodenectomy with pancreatic ductal stent (series 2, image 35). Spleen: Normal in size without significant abnormality. Adrenals/Urinary Tract: Adrenal glands are unremarkable. Small nonobstructive left renal calculi bladder is unremarkable. Stomach/Bowel: Status post Whipple pancreaticoduodenectomy and gastrojejunostomy. Extensive thickening of the colon wall with adjacent fat stranding, which involves the length of the colon to the rectum. Vascular/Lymphatic: Scattered aortic atherosclerosis. No enlarged abdominal or pelvic lymph nodes. Reproductive: No mass or other significant abnormality. Other: Multiple surgical drainage catheters about the left and right abdomen, tips positioned in the vicinity of the duodenectomy site, about the gastric body, and anterior to the pancreatic tail. There is retroperitoneal fluid at the  pancreatic head resection site anterior to the IVC with an adjacent drainage catheter, collection measuring approximately 5.4 x 3.0 cm (series 2, image 43). There is loculated appearing air and fluid in the left upper quadrant about the spleen and below the left hemidiaphragm, largest component of collection measuring approximately 13.1 x 10.3 cm (series 2, image 22). Extensive fat stranding of the omentum and mesenteric fat (series 2, image 48). Musculoskeletal: No acute or significant  osseous findings. IMPRESSION: 1. Status post Whipple pancreaticoduodenectomy with pancreatic ductal stent in place. 2. Multiple surgical drainage catheters about the left and right abdomen, tips positioned in the vicinity of the duodenectomy site, about the gastric body, and anterior to the pancreatic tail. 3. There is retroperitoneal fluid at the pancreatic head resection site anterior to the IVC with an adjacent drainage catheter, collection measuring approximately 5.4 x 3.0 cm (series 2, image 43). 4. There is loculated appearing air and fluid in the left upper quadrant about the spleen and below the left hemidiaphragm, largest component of collection measuring approximately 13.1 x 10.3 cm (series 2, image 22). 5. Biliary leak or abscess are differential considerations given these postoperative findings. The presence or absence of infection is not established by CT. 6. Extensive thickening of the colon wall with adjacent fat stranding, which involves the length of the colon to the rectum, consistent with nonspecific infectious, inflammatory, or ischemic colitis. 7. Extensive fat stranding of the omentum and mesenteric fat. 8. Small left pleural effusion and associated atelectasis or consolidation. 9. Small nonobstructive left renal calculi. 10.  Aortic Atherosclerosis (ICD10-I70.0). These results will be called to the ordering clinician or representative by the Radiologist Assistant, and communication documented in the PACS or zVision Dashboard. Electronically Signed   By: Eddie Candle M.D.   On: 02/15/2019 16:04   IR Catheter Tube Change  Result Date: 02/16/2019 INDICATION: Pancreatic neuroendocrine tumor post resection. Pancreatic leak. Nausea. 13 cm loculated left upper quadrant fluid collection despite presence of surgical drain. Drain revision requested. EXAM: EXCHANGE OF PERITONEAL DRAIN UNDER FLUOROSCOPY MEDICATIONS: The patient is currently admitted to the hospital and receiving intravenous  antibiotics. The antibiotics were administered within an appropriate time frame prior to the initiation of the procedure. ANESTHESIA/SEDATION: Intravenous Fentanyl 18mcg and Versed 2mg  were administered as conscious sedation during continuous monitoring of the patient's level of consciousness and physiological / cardiorespiratory status by the radiology RN, with a total moderate sedation time of 19 minutes. COMPLICATIONS: None immediate. PROCEDURE: Informed written consent was obtained from the patient after a thorough discussion of the procedural risks, benefits and alternatives. All questions were addressed. Maximal Sterile Barrier Technique was utilized including caps, mask, sterile gowns, sterile gloves, sterile drape, hand hygiene and skin antiseptic. A timeout was performed prior to the initiation of the procedure. The site surrounding the left surgical drain skin entry was prepped with chlorhexidine, draped in usual sterile fashion, infiltrated locally with over 1% lidocaine. Small contrast injection demonstrated communication with the left upper quadrant fluid collection. An angled glidewire was advanced through the surgical drain into the collection. The surgical drain was removed and initially a 16 French pigtail catheter was advanced but its length allowed advancement just to the margin of the collection, which was thought to be unacceptable. Therefore, this was exchanged for a long 12 French pigtail drain catheter, well formed centrally within the collection. Small contrast injection confirmed appropriate positioning. Nearly 400 mL of greenish opaque fluid were removed. The catheter was then secured externally with 0 Prolene suture  and StatLock and placed to gravity drainage. The patient tolerated the procedure well. IMPRESSION: 1. Technically successful exchange and repositioning of left upper quadrant percutaneous drain catheter. Electronically Signed   By: Lucrezia Europe M.D.   On: 02/16/2019 15:01    DG Chest Port 1 View  Result Date: 02/16/2019 CLINICAL DATA:  Tachycardia. EXAM: PORTABLE CHEST 1 VIEW COMPARISON:  One-view chest x-ray 01/27/2019 FINDINGS: The heart is enlarged, exaggerated by low lung volumes. Right lung is clear. Left basilar airspace disease is improved. Small left effusion is not excluded. Left upper quadrant drain is noted. IMPRESSION: 1. Improving left basilar airspace disease. 2. Low lung volumes. 3. No acute cardiopulmonary disease. Electronically Signed   By: San Morelle M.D.   On: 02/16/2019 09:27     Anti-infectives:   Anti-infectives (From admission, onward)   Start     Dose/Rate Route Frequency Ordered Stop   02/16/19 1830  vancomycin (VANCOREADY) IVPB 1250 mg/250 mL     1,250 mg 166.7 mL/hr over 90 Minutes Intravenous Every 24 hours 02/15/19 1813     02/15/19 2200  piperacillin-tazobactam (ZOSYN) IVPB 3.375 g  Status:  Discontinued     3.375 g 12.5 mL/hr over 240 Minutes Intravenous Every 8 hours 02/15/19 1750 02/15/19 1813   02/15/19 1830  piperacillin-tazobactam (ZOSYN) IVPB 3.375 g     3.375 g 12.5 mL/hr over 240 Minutes Intravenous Every 8 hours 02/15/19 1813     02/15/19 1830  vancomycin (VANCOREADY) IVPB 2000 mg/400 mL     2,000 mg 200 mL/hr over 120 Minutes Intravenous  Once 02/15/19 1813 02/15/19 2040      Alphonsa Overall, MD, [facility]: [phone] 02/17/2019

## 2019-02-17 NOTE — Evaluation (Signed)
Physical Therapy Evaluation Patient Details Name: Leah Farmer MRN: [id number] DOB: 10-13-1965 Today's Date: 02/17/2019   History of Present Illness  Patient is a 54 y/o female who presents with abscess/fluid collection LUQ s/p Exchange and reposition Lft lateral abd drain to 59f pigtail 02/16/19.  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  Clinical Impression  Patient presents with right foot drop, dyspnea on exertion, decreased activity tolerance, generalized weakness and impaired mobility s/p above. Pt uses RW for ambulation and has needed some assist at home from spouse and son. Family working on getting some more help for this week as son goes back to school. Today, pt tolerated gait training with steppage like gait due to right foot drop. 2/4 DOE noted but HR and Sp02 stable. Has been working with [facility] and recently got an AFO but does not have it here. Encouraged walking with spouse and nurse tech later today to improve strength/endurance. Would benefit from continuing [facility] services. Will follow acutely to maximize independence and mobility prior to return home.    Follow Up Recommendations Supervision for mobility/OOB;Home health PT(continue [facility] services)    Equipment Recommendations  None recommended by PT    Recommendations for Other Services       Precautions / Restrictions Precautions Precautions: Fall Precaution Comments: 3 drains Restrictions Weight Bearing Restrictions: No      Mobility  Bed Mobility               General bed mobility comments: Sitting in chair upon PT arrival.  Transfers Overall transfer level: Needs assistance Equipment used: Rolling walker (2 wheeled) Transfers: Sit to/from Stand Sit to Stand: Supervision         General transfer comment: Supervision for safety. Stood from chair x2, good placement of hands.  Ambulation/Gait Ambulation/Gait assistance: Min guard Gait Distance (Feet): 60 Feet(+  20') Assistive device: Rolling walker (2 wheeled) Gait Pattern/deviations: Step-through pattern;Steppage;Decreased dorsiflexion - right Gait velocity: slow   General Gait Details: Slow, mostly steady steppage gait due to right foot drop. Does not have AFO here. 2-3/4 DOE. VSS throughout. 1 seated rest break.  Stairs            Wheelchair Mobility    Modified Rankin (Stroke Patients Only)       Balance Overall balance assessment: Needs assistance Sitting-balance support: No upper extremity supported;Feet supported Sitting balance-[name]: Good Sitting balance - Comments: Assist to donn shoes   Standing balance support: During functional activity Standing balance-[name]: Fair Standing balance comment: Able to stand statically to donn mask without UE support, needs UE support for walking.                             Pertinent Vitals/Pain Pain Assessment: No/denies pain    Home Living Family/patient expects to be discharged to:: Private residence Living Arrangements: Spouse/significant other Available Help at Discharge: Family;Available 24 hours/day(son is going back to school this week so trying to find additional help) Type of Home: House Home Access: Stairs to enter   [employer] of Steps: 1 Home Layout: One level Home Equipment: [place] - single point;Walker - 2 wheels;Other (comment) Additional Comments: craft matic like bed    Prior Function Level of Independence: Independent with assistive device(s);Needs assistance   Gait / Transfers Assistance Needed: Pt using RW for ambulation since surgery 2 weeks ago.  ADL's / Homemaking Assistance Needed: Needs assist with getting in/out of the shower.  Comments: Spouse works but he was helping in evening and son helping out in AM. Son goes back to [employer] school this week so looking for additional help.     Hand Dominance   Dominant Hand: Right    Extremity/Trunk Assessment   Upper  Extremity Assessment Upper Extremity Assessment: Defer to OT evaluation    Lower Extremity Assessment Lower Extremity Assessment: RLE deficits/detail RLE Deficits / Details: Numbness distal to knee and into foot; 0/5 DF, some lateral ankle movement in inversion/eversion present. 4/5 knee extension/flexion. RLE Coordination: decreased fine motor       Communication   Communication: No difficulties  Cognition Arousal/Alertness: Awake/alert Behavior During Therapy: WFL for tasks assessed/performed Overall Cognitive Status: Within Functional Limits for tasks assessed                                        General Comments General comments (skin integrity, edema, etc.): SPouse present during session. VSS throughout with HR in 90s-100s    Exercises     Assessment/Plan    PT Assessment Patient needs continued PT services  PT Problem List Decreased strength;Decreased activity tolerance;Decreased balance;Decreased mobility;Decreased knowledge of use of DME;Impaired sensation;Decreased range of motion;Cardiopulmonary status limiting activity       PT Treatment Interventions DME instruction;Gait training;Functional mobility training;Therapeutic activities;Therapeutic exercise;Balance training;Patient/family education;Neuromuscular re-education    PT Goals (Current goals can be found in the Care Plan section)  Acute Rehab PT Goals Patient Stated Goal: to get this RLE to work PT Goal Formulation: With patient Time For Goal Achievement: 03/03/19 Potential to Achieve Goals: Good    Frequency Min 3X/week   Barriers to discharge Decreased caregiver support working on getting some help at home    Co-evaluation               AM-PAC PT "6 Clicks" Mobility  Outcome Measure Help needed turning from your back to your side while in a flat bed without using bedrails?: None Help needed moving from lying on your back to sitting on the side of a flat bed without using  bedrails?: None Help needed moving to and from a bed to a chair (including a wheelchair)?: None Help needed standing up from a chair using your arms (e.g., wheelchair or bedside chair)?: None Help needed to walk in hospital room?: A Little Help needed climbing 3-5 steps with a railing? : A Little 6 Click Score: 22    End of Session Equipment Utilized During Treatment: Gait belt Activity Tolerance: Patient tolerated treatment well;Patient limited by fatigue Patient left: in chair;with call bell/phone within reach;with family/visitor present Nurse Communication: Mobility status;Other (comment)(husband walking pt to bathroom/back) PT Visit Diagnosis: Other abnormalities of gait and mobility (R26.89);Muscle weakness (generalized) (M62.81)    Time: [id number] PT Time Calculation (min) (ACUTE ONLY): 28 min   Charges:   PT Evaluation $PT Eval Moderate Complexity: 1 Mod PT Treatments $Gait Training: 8-22 mins        Marisa Severin, PT, DPT Acute Rehabilitation Services Pager [phone] Office [place] 02/17/2019, 12:49 PM

## 2019-02-17 NOTE — Progress Notes (Signed)
Inpatient Diabetes Program Recommendations  AACE/ADA: New Consensus Statement on Inpatient Glycemic Control (2015)  Target Ranges:  Prepandial:   less than 140 mg/dL      Peak postprandial:   less than 180 mg/dL (1-2 hours)      Critically ill patients:  140 - 180 mg/dL   Lab Results  Component Value Date   GLUCAP 160 (H) 02/17/2019   HGBA1C 7.5 (H) 01/11/2019    Review of Glycemic Control  OP Meds: Lantus 25 units bid Orders: Lantus 15 units QHS, Novolog 0-15 units tidwc + 4 units tidwc  HgbA1C - 7.5% Blood sugars trending well since admission. Starting Soft Diet today.   Inpatient Diabetes Program Recommendations:     Agree with orders.  Will follow trends.  Thank you. Lorenda Peck, RD, LDN, CDE Inpatient Diabetes Coordinator [phone]

## 2019-02-18 LAB — CBC WITH DIFFERENTIAL/PLATELET
Abs Immature Granulocytes: 0.28 10*3/uL — ABNORMAL HIGH (ref 0.00–0.07)
Basophils Absolute: 0.1 10*3/uL (ref 0.0–0.1)
Basophils Relative: 1 %
Eosinophils Absolute: 0.3 10*3/uL (ref 0.0–0.5)
Eosinophils Relative: 2 %
HCT: 32.4 % — ABNORMAL LOW (ref 36.0–46.0)
Hemoglobin: 9.6 g/dL — ABNORMAL LOW (ref 12.0–15.0)
Immature Granulocytes: 3 %
Lymphocytes Relative: 22 %
Lymphs Abs: 2.4 10*3/uL (ref 0.7–4.0)
MCH: 26.9 pg (ref 26.0–34.0)
MCHC: 29.6 g/dL — ABNORMAL LOW (ref 30.0–36.0)
MCV: 90.8 fL (ref 80.0–100.0)
Monocytes Absolute: 0.8 10*3/uL (ref 0.1–1.0)
Monocytes Relative: 8 %
Neutro Abs: 6.8 10*3/uL (ref 1.7–7.7)
Neutrophils Relative %: 64 %
Platelets: 358 10*3/uL (ref 150–400)
RBC: 3.57 MIL/uL — ABNORMAL LOW (ref 3.87–5.11)
RDW: 14.9 % (ref 11.5–15.5)
WBC: 10.5 10*3/uL (ref 4.0–10.5)
nRBC: 0 % (ref 0.0–0.2)

## 2019-02-18 LAB — BASIC METABOLIC PANEL
Anion gap: 9 (ref 5–15)
BUN: 9 mg/dL (ref 6–20)
CO2: 22 mmol/L (ref 22–32)
Calcium: 7.8 mg/dL — ABNORMAL LOW (ref 8.9–10.3)
Chloride: 109 mmol/L (ref 98–111)
Creatinine, Ser: 0.87 mg/dL (ref 0.44–1.00)
GFR calc Af Amer: >60 mL/min (ref >60)
GFR calc non Af Amer: >60 mL/min (ref >60)
Glucose, Bld: 113 mg/dL — ABNORMAL HIGH (ref 70–99)
Potassium: 3.5 mmol/L (ref 3.5–5.1)
Sodium: 140 mmol/L (ref 135–145)

## 2019-02-18 LAB — GLUCOSE, CAPILLARY
Glucose-Capillary: 126 mg/dL — ABNORMAL HIGH (ref 70–99)
Glucose-Capillary: 156 mg/dL — ABNORMAL HIGH (ref 70–99)
Glucose-Capillary: 137 mg/dL — ABNORMAL HIGH (ref 70–99)
Glucose-Capillary: 143 mg/dL — ABNORMAL HIGH (ref 70–99)

## 2019-02-18 MED ORDER — MEGESTROL ACETATE 400 MG/10ML PO SUSP
200.0000 mg | Freq: Every day | ORAL | Status: DC
  Filled 2019-02-18 (×2): qty 10
  Filled 2019-02-18 (×2): qty 5

## 2019-02-18 MED ORDER — AMOXICILLIN-POT CLAVULANATE 875-125 MG PO TABS
1.0000 | ORAL_TABLET | Freq: Two times a day (BID) | ORAL | Status: DC
  Administered 2019-02-18: 1 via ORAL
  Filled 2019-02-18 (×2): qty 1

## 2019-02-18 MED ORDER — PANTOPRAZOLE SODIUM 40 MG PO TBEC
40.0000 mg | DELAYED_RELEASE_TABLET | Freq: Every day | ORAL | Status: DC
  Administered 2019-02-18: 40 mg via ORAL
  Filled 2019-02-18: qty 1

## 2019-02-18 NOTE — Progress Notes (Signed)
Physical Therapy Treatment Patient Details Name: Leah Farmer MRN: [id number] DOB: 1965-05-21 Today's Date: 02/18/2019    History of Present Illness Patient is a 54 y/o female who presents with abscess/fluid collection LUQ s/p Exchange and reposition Lft lateral abd drain to 5f pigtail 02/16/19.  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    PT Comments    Pt in bed upon PT arrival, agreeable to PT session at this time. The pt continues to present with limitations in functional mobility, endurance, and activity tolerance compared to their prior level of function and independence due to above dx. However, the pt was able to demonstrate improved ambulation endurance with this session, and reports she and her spouse were able to ambulate in her room without issue yesterday evening. Time was spent teaching the pt an HEP that she can complete in the bed or chair to maintain strength in addition to daily bouts of ambulation with staff or her husband. PT will continue to follow 1x/wk to progress HEP and make further recommendations.    Follow Up Recommendations  Supervision for mobility/OOB;Home health PT(contiue HHPT services)     Equipment Recommendations  None recommended by PT    Recommendations for Other Services       Precautions / Restrictions Precautions Precautions: Fall Precaution Comments: 3 drains Restrictions Weight Bearing Restrictions: No    Mobility  Bed Mobility Overal bed mobility: Needs Assistance Bed Mobility: Rolling;Sidelying to Sit;Supine to Sit Rolling: Modified independent (Device/Increase time) Sidelying to sit: Modified independent (Device/Increase time) Supine to sit: Modified independent (Device/Increase time)     General bed mobility comments: pt able to come to sitting EOB without assist, used rails  Transfers Overall transfer level: Needs assistance Equipment used: Rolling walker (2 wheeled) Transfers: Sit  to/from Stand Sit to Stand: Supervision         General transfer comment: Supervision for safety. Stood from chair x2, good placement of hands.  Ambulation/Gait Ambulation/Gait assistance: Min guard Gait Distance (Feet): 75 Feet(75 + 75 with seated rest between) Assistive device: Rolling walker (2 wheeled) Gait Pattern/deviations: Step-through pattern;Steppage;Decreased dorsiflexion - right Gait velocity: 0.34 m/s Gait velocity interpretation: <1.8 ft/sec, indicate of risk for recurrent falls General Gait Details: Slow, mostly steady steppage gait due to right foot drop. Does not have AFO here. 2-3/4 DOE. VSS throughout. seated rest break after each 75 ft walking bout   Stairs             Wheelchair Mobility    Modified Rankin (Stroke Patients Only)       Balance Overall balance assessment: Needs assistance Sitting-balance support: No upper extremity supported;Feet supported Sitting balance-[name]: Good Sitting balance - Comments: Assist to donn shoes   Standing balance support: During functional activity Standing balance-[name]: Fair Standing balance comment: Able to stand statically to donn mask without UE support, needs UE support for walking.                            Cognition Arousal/Alertness: Awake/alert Behavior During Therapy: WFL for tasks assessed/performed Overall Cognitive Status: Within Functional Limits for tasks assessed                         Following Commands: Follows one step commands consistently       General Comments: pt with good understanding of safety with management of drains as well as energy conservation  Exercises General Exercises - Lower Extremity Long Arc Quad: AROM;Both;10 reps;Seated Heel Raises: AROM;Both;15 reps;Seated    General Comments General comments (skin integrity, edema, etc.): spouse not present, pt reports she and spouse ambulated in room yesterday without incident.       Pertinent Vitals/Pain Pain Assessment: Faces Faces Pain Scale: Hurts a little bit Pain Location: incisions/drain sites Pain Intervention(s): Limited activity within patient's tolerance;Monitored during session;Repositioned    Home Living                      Prior Function            PT Goals (current goals can now be found in the care plan section) Acute Rehab PT Goals Patient Stated Goal: to get this RLE to work PT Goal Formulation: With patient Time For Goal Achievement: 03/03/19 Potential to Achieve Goals: Good Progress towards PT goals: Progressing toward goals    Frequency    Min 1X/week(1x per wk to progress HEP while here in hospital)      PT Plan Frequency needs to be updated    Co-evaluation              AM-PAC PT "6 Clicks" Mobility   Outcome Measure  Help needed turning from your back to your side while in a flat bed without using bedrails?: None Help needed moving from lying on your back to sitting on the side of a flat bed without using bedrails?: None Help needed moving to and from a bed to a chair (including a wheelchair)?: None Help needed standing up from a chair using your arms (e.g., wheelchair or bedside chair)?: None Help needed to walk in hospital room?: A Little Help needed climbing 3-5 steps with a railing? : A Little 6 Click Score: 22    End of Session Equipment Utilized During Treatment: Gait belt Activity Tolerance: Patient tolerated treatment well;Patient limited by fatigue Patient left: in chair;with call bell/phone within reach Nurse Communication: Mobility status PT Visit Diagnosis: Other abnormalities of gait and mobility (R26.89);Muscle weakness (generalized) (M62.81);Pain Pain - Right/Left: (abdomen/drain site) Pain - part of body: (abdomen/drain sites)     Time: [id number] PT Time Calculation (min) (ACUTE ONLY): 33 min  Charges:  $Gait Training: 8-22 mins $Therapeutic Exercise: 8-22 mins                      Karma Ganja, PT, DPT   Acute Rehabilitation Department Pager #: [phone]   Otho Bellows 02/18/2019, 12:01 PM

## 2019-02-18 NOTE — Progress Notes (Signed)
Referring Physician(s): Dr. Barry Dienes  Supervising Physician: Markus Daft  Patient Status:  [facility] - In-pt  Chief Complaint:  Re-check drain  Brief History:  Leah Farmer is a 54 y.o. 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    She underwent Whipple procedure, distal pancreatetomy 01/15/19.    At that time, 3 surgical drains were left in place.    She had a prolonged recovery complicated by respiratory failure, increased drain output (+ for amylase), and poor oral intake requiring TPN.  She was discharged home with drains in place.    She returned to the ED on 02/15/19 with abdominal pain, nausea, vomiting.  She is found to have a large LUQ fluid collection by CT.   She underwent exchange and repositioning of left upper quadrant percutaneous drain catheter by Dr. Vernard Gambles on 02/16/19.   Subjective:  No complaints. Says drain seems to be working well.  Allergies: Ciprofloxacin  Medications: Prior to Admission medications   Medication Sig Start Date End Date Taking? Authorizing Provider  blood glucose meter kit and supplies KIT Dispense based on patient and insurance preference. Use up to four times daily as directed. (FOR ICD-9 250.00, 250.01). 01/30/19  Yes Sheikh, Omair Latif, DO  clonazePAM (KLONOPIN) 0.5 MG tablet Take 0.25-0.5 mg by mouth daily as needed for anxiety.   Yes [provider]  feeding supplement, GLUCERNA SHAKE, (GLUCERNA SHAKE) LIQD Take 237 mLs by mouth 3 (three) times daily between meals. 01/29/19  Yes Stark Klein, MD  insulin glargine (LANTUS) 100 UNIT/ML injection Inject 0.25 mLs (25 Units total) into the skin 2 (two) times daily. 01/30/19  Yes Sheikh, Omair Latif, DO  Insulin Syringes, Disposable, U-100 1 ML MISC 1 Syringe by Does not apply route 2 (two) times daily. 01/30/19  Yes Sheikh, Omair Latif, DO  lisinopril-hydrochlorothiazide (ZESTORETIC) 20-12.5 MG tablet Take 1 tablet by mouth daily.   Yes [provider]    meclizine (ANTIVERT) 12.5 MG tablet Take 12.5 mg by mouth 3 (three) times daily as needed for dizziness.   Yes [provider]  Menthol, Topical Analgesic, (BIOFREEZE ROLL-ON EX) Apply 1 application topically 3 (three) times daily as needed (back pain.).   Yes [provider]  metoprolol succinate (TOPROL-XL) 50 MG 24 hr tablet Take 50 mg by mouth at bedtime. Take with or immediately following a meal.    Yes [provider]  norgestrel-ethinyl estradiol (CRYSELLE-28) 0.3-30 MG-MCG tablet Take 1 tablet by mouth at bedtime.    Yes [provider]  omeprazole (PRILOSEC) 20 MG capsule Take 20 mg by mouth daily.   Yes [provider]  oxyCODONE (OXY IR/ROXICODONE) 5 MG immediate release tablet Take 1-2 tablets (5-10 mg total) by mouth every 6 (six) hours as needed for moderate pain, severe pain or breakthrough pain. 01/19/19  Yes Stark Klein, MD  promethazine (PHENERGAN) 12.5 MG tablet Take 12.5 mg by mouth every 6 (six) hours as needed. 02/13/19  Yes [provider]  rosuvastatin (CRESTOR) 10 MG tablet Take 10 mg by mouth at bedtime.   Yes [provider]  traMADol (ULTRAM) 50 MG tablet Take 50 mg by mouth 2 (two) times daily as needed (kidney stone pain).  07/21/18  Yes [provider]  Vitamin D, Ergocalciferol, (DRISDOL) 1.25 MG (50000 UT) CAPS capsule Take 50,000 Units by mouth every Saturday.   Yes [provider]  amoxicillin-clavulanate (AUGMENTIN) 875-125 MG tablet Take 1 tablet by mouth 2 (two) times daily. 02/15/19   [provider]  pantoprazole (PROTONIX) 40 MG tablet Take 1 tablet (40 mg total) by mouth daily at 12 noon. Patient not taking: Reported on 02/15/2019 01/29/19   Stark Klein, MD  prochlorperazine (COMPAZINE) 10 MG tablet Take 1 tablet (10 mg total) by mouth every 6 (six) hours as needed for nausea or vomiting (Use for nausea and / or vomiting unresolved with ondansetron (Zofran).). Patient  not taking: Reported on 02/15/2019 01/19/19   Stark Klein, MD     Vital Signs: BP 119/77 (BP Location: Right Arm)   Pulse 88   Temp 98.1 F (36.7 C) (Oral)   Resp 18   Ht '5\' 4"'  (1.626 m)   Wt 103.1 kg   SpO2 96%   BMI 39.01 kg/m   Physical Exam Awake and alert NAD Left drain in place ~35 mL output recorded and ~50 mL white drainage with sediment in gravity bag. Flushes easily  Imaging: CT ABDOMEN PELVIS WO CONTRAST  Result Date: 02/15/2019 CLINICAL DATA:  Nausea, dry heaving, diarrhea, pancreatic cancer, status post Whipple, evaluate pancreatic fluid leak EXAM: CT ABDOMEN AND PELVIS WITHOUT CONTRAST TECHNIQUE: Multidetector CT imaging of the abdomen and pelvis was performed following the standard protocol without IV contrast. COMPARISON:  08/09/2018 FINDINGS: Lower chest: Small left pleural effusion and associated atelectasis or consolidation. Hepatobiliary: No focal liver abnormality is seen. Status post cholecystectomy. No biliary dilatation. Pancreas: Postoperative findings of Whipple pancreaticoduodenectomy with pancreatic ductal stent (series 2, image 35). Spleen: Normal in size without significant abnormality. Adrenals/Urinary Tract: Adrenal glands are unremarkable. Small nonobstructive left renal calculi bladder is unremarkable. Stomach/Bowel: Status post Whipple pancreaticoduodenectomy and gastrojejunostomy. Extensive thickening of the colon wall with adjacent fat stranding, which involves the length of the colon to the rectum. Vascular/Lymphatic: Scattered aortic atherosclerosis. No enlarged abdominal or pelvic lymph nodes. Reproductive: No mass or other significant abnormality. Other: Multiple surgical drainage catheters about the left and right abdomen, tips positioned in the vicinity of the duodenectomy site, about the gastric body, and anterior to the pancreatic tail. There is retroperitoneal fluid at the pancreatic head resection site anterior to the IVC with an adjacent  drainage catheter, collection measuring approximately 5.4 x 3.0 cm (series 2, image 43). There is loculated appearing air and fluid in the left upper quadrant about the spleen and below the left hemidiaphragm, largest component of collection measuring approximately 13.1 x 10.3 cm (series 2, image 22). Extensive fat stranding of the omentum and mesenteric fat (series 2, image 48). Musculoskeletal: No acute or significant osseous findings. IMPRESSION: 1. Status post Whipple pancreaticoduodenectomy with pancreatic ductal stent in place. 2. Multiple surgical drainage catheters about the left and right abdomen, tips positioned in the vicinity of the duodenectomy site, about the gastric body, and anterior to the pancreatic tail. 3. There is retroperitoneal fluid at the pancreatic head resection site anterior to the IVC with an adjacent drainage catheter, collection measuring approximately 5.4 x 3.0 cm (series 2, image 43). 4. There is loculated appearing air and fluid in the left upper quadrant about the spleen and below the left hemidiaphragm, largest component of collection measuring approximately 13.1 x 10.3 cm (series 2, image 22). 5. Biliary leak or abscess are differential considerations given these postoperative findings. The presence or absence of infection is not established by CT. 6. Extensive thickening of the colon wall with adjacent fat stranding, which involves the length of the colon to the rectum, consistent with nonspecific infectious, inflammatory, or ischemic colitis. 7. Extensive fat stranding of the omentum and mesenteric fat. 8.  Small left pleural effusion and associated atelectasis or consolidation. 9. Small nonobstructive left renal calculi. 10.  Aortic Atherosclerosis (ICD10-I70.0). These results will be called to the ordering clinician or representative by the Radiologist Assistant, and communication documented in the PACS or zVision Dashboard. Electronically Signed   By: Eddie Candle M.D.   On:  02/15/2019 16:04   IR Catheter Tube Change  Result Date: 02/16/2019 INDICATION: Pancreatic neuroendocrine tumor post resection. Pancreatic leak. Nausea. 13 cm loculated left upper quadrant fluid collection despite presence of surgical drain. Drain revision requested. EXAM: EXCHANGE OF PERITONEAL DRAIN UNDER FLUOROSCOPY MEDICATIONS: The patient is currently admitted to the hospital and receiving intravenous antibiotics. The antibiotics were administered within an appropriate time frame prior to the initiation of the procedure. ANESTHESIA/SEDATION: Intravenous Fentanyl 140mg and Versed 218mwere administered as conscious sedation during continuous monitoring of the patient's level of consciousness and physiological / cardiorespiratory status by the radiology RN, with a total moderate sedation time of 19 minutes. COMPLICATIONS: None immediate. PROCEDURE: Informed written consent was obtained from the patient after a thorough discussion of the procedural risks, benefits and alternatives. All questions were addressed. Maximal Sterile Barrier Technique was utilized including caps, mask, sterile gowns, sterile gloves, sterile drape, hand hygiene and skin antiseptic. A timeout was performed prior to the initiation of the procedure. The site surrounding the left surgical drain skin entry was prepped with chlorhexidine, draped in usual sterile fashion, infiltrated locally with over 1% lidocaine. Small contrast injection demonstrated communication with the left upper quadrant fluid collection. An angled glidewire was advanced through the surgical drain into the collection. The surgical drain was removed and initially a 16 French pigtail catheter was advanced but its length allowed advancement just to the margin of the collection, which was thought to be unacceptable. Therefore, this was exchanged for a long 12 French pigtail drain catheter, well formed centrally within the collection. Small contrast injection confirmed  appropriate positioning. Nearly 400 mL of greenish opaque fluid were removed. The catheter was then secured externally with 0 Prolene suture and StatLock and placed to gravity drainage. The patient tolerated the procedure well. IMPRESSION: 1. Technically successful exchange and repositioning of left upper quadrant percutaneous drain catheter. Electronically Signed   By: D Lucrezia Europe.D.   On: 02/16/2019 15:01   DG Chest Port 1 View  Result Date: 02/16/2019 CLINICAL DATA:  Tachycardia. EXAM: PORTABLE CHEST 1 VIEW COMPARISON:  One-view chest x-ray 01/27/2019 FINDINGS: The heart is enlarged, exaggerated by low lung volumes. Right lung is clear. Left basilar airspace disease is improved. Small left effusion is not excluded. Left upper quadrant drain is noted. IMPRESSION: 1. Improving left basilar airspace disease. 2. Low lung volumes. 3. No acute cardiopulmonary disease. Electronically Signed   By: ChSan Morelle.D.   On: 02/16/2019 09:27    Labs:  CBC: Recent Labs    02/15/19 1855 02/16/19 0436 02/17/19 0344 02/18/19 0300  WBC 11.3* 10.2 8.7 10.5  HGB 10.4* 9.8* 9.9* 9.6*  HCT 33.7* 31.7* 31.8* 32.4*  PLT 405* 371 345 358    COAGS: Recent Labs    07/09/18 1037 01/11/19 1109 01/16/19 0610 02/15/19 1855  INR 1.0 0.9 1.3* 1.3*    BMP: Recent Labs    02/15/19 1855 02/16/19 0436 02/17/19 0344 02/18/19 0300  NA 141 141 138 140  K 2.4* 2.8* 3.4* 3.5  CL 99 105 107 109  CO2 '26 25 23 22  ' GLUCOSE 150* 142* 153* 113*  BUN '14 12 10 ' 9  CALCIUM 8.5* 7.9* 7.7* 7.8*  CREATININE 0.91 0.89 0.86 0.87  GFRNONAA >60 >60 >60 >60  GFRAA >60 >60 >60 >60    LIVER FUNCTION TESTS: Recent Labs    01/24/19 0200 01/25/19 0433 02/15/19 1123 02/15/19 1855  BILITOT 0.9 0.5 0.9 0.9  AST '27 30 15 ' 13*  ALT '18 19 13 11  ' ALKPHOS 61 68 81 68  PROT 5.8* 5.5* 8.4* 7.1  ALBUMIN 2.5* 2.2* 2.9* 2.3*    Assessment and Plan:  Leah Farmer is a 54 y.o. female with a pancreatic  neuroendocrine tumor.    She underwent Whipple procedure, distal pancreatetomy 01/15/19.    She returned to the ED on 02/15/19 with abdominal pain, nausea, vomiting.   She was found to have a large LUQ fluid collection by CT.   She underwent  exchange and repositioning of left upper quadrant percutaneous drain catheter by Dr. Vernard Gambles on 02/16/19.   Continue routine drain care with flushes and record output.  Electronically Signed: Murrell Redden, PA-C 02/18/2019, 11:43 AM    I spent a total of 15 Minutes at the the patient's bedside AND on the patient's hospital floor or unit, greater than 50% of which was counseling/coordinating care for f/u drain exchange.

## 2019-02-18 NOTE — Progress Notes (Signed)
[facility] Office:  [phone] General Surgery Progress Note   LOS: 3 days  POD -     Chief Complaint: Abdominal pain  Assessment and Plan: 1.  Abscess/fluid collection LUQ  S/P Whipple - 01/15/2019 - Lothar Prehn for neuroendocrine tumor, 1/10 nodes  Has 3 drains -drainage decreased.  Has been on vanc/zosyn.  D/c IV antibiotics and switch to augmentin.    Diet as tolerated.   Nausea- aggressively treat.  6 small meals per day.    2. DM- SSI, lantus, Diabetes team assisting.   3. Anemia - multifactorial - stable.   4. Hypokalemia  K+ - 3.5 - 02/18/2019  Improved, but large chalky pills adding to nausea.   5.  Low magnesium  Replaced 02/16/2019 6.  Right foot drop - PT 7.  DVT prophylaxis - On lovenox    Active Problems:   Left upper quadrant abdominal abscess (HCC)  Subjective:  Small meals as tolerated.    Objective:   Vitals:   02/18/19 1154 02/18/19 1300  BP:  122/81  Pulse: 80 86  Resp:  18  Temp:  98.2 F (36.8 C)  SpO2: 98% 98%     Intake/Output from previous day:  01/17 0701 - 01/18 0700 In: 2081.5 [P.O.:890; I.V.:500; IV Piggyback:686.5] Out: 70 [Drains:70]  Intake/Output this shift:  No intake/output data recorded.   Physical Exam:   General: NAD, A&O x 3.  HEENT: Normal. Pupils equal. .   Lungs: breathing comfortably   Abdomen: Soft   Wound: Well healed chevron incision 3 abdominal drains - purulent.     Lab Results:    Recent Labs    02/17/19 0344 02/18/19 0300  WBC 8.7 10.5  HGB 9.9* 9.6*  HCT 31.8* 32.4*  PLT 345 358    BMET   Recent Labs    02/17/19 0344 02/18/19 0300  NA 138 140  K 3.4* 3.5  CL 107 109  CO2 23 22  GLUCOSE 153* 113*  BUN 10 9  CREATININE 0.86 0.87  CALCIUM 7.7* 7.8*    PT/INR   No results for input(s): LABPROT, INR in the last 72 hours.  ABG  No results for input(s): PHART, HCO3 in the last 72 hours.  Invalid input(s): PCO2, PO2   Studies/Results:  No results  found.   Anti-infectives:   Anti-infectives (From admission, onward)   Start     Dose/Rate Route Frequency Ordered Stop   02/18/19 1800  amoxicillin-clavulanate (AUGMENTIN) 875-125 MG per tablet 1 tablet     1 tablet Oral Every 12 hours 02/18/19 1241 02/28/19 2159   02/16/19 1830  vancomycin (VANCOREADY) IVPB 1250 mg/250 mL  Status:  Discontinued     1,250 mg 166.7 mL/hr over 90 Minutes Intravenous Every 24 hours 02/15/19 1813 02/18/19 1241   02/15/19 2200  piperacillin-tazobactam (ZOSYN) IVPB 3.375 g  Status:  Discontinued     3.375 g 12.5 mL/hr over 240 Minutes Intravenous Every 8 hours 02/15/19 1750 02/15/19 1813   02/15/19 1830  piperacillin-tazobactam (ZOSYN) IVPB 3.375 g  Status:  Discontinued     3.375 g 12.5 mL/hr over 240 Minutes Intravenous Every 8 hours 02/15/19 1813 02/18/19 1241   02/15/19 1830  vancomycin (VANCOREADY) IVPB 2000 mg/400 mL     2,000 mg 200 mL/hr over 120 Minutes Intravenous  Once 02/15/19 1813 02/15/19 2040      Milus Height, MD FACS Surgical Oncology, General Surgery, Trauma and [place] [facility], [place] for weekday/non holidays Check [email or web address] for  coverage night/weekend/holidays  Do not use SecureChat as we are not always in the epic system to get the message.  Also, we may be off.  It is not reliable for patient care.

## 2019-02-18 NOTE — Plan of Care (Signed)

## 2019-02-18 NOTE — Progress Notes (Signed)
Pharmacy Antibiotic Note  Leah Farmer is a 54 y.o. female admitted on 02/15/2019 with LUQ abscess.  Pharmacy has been consulted for Vanco, Zosyn dosing.  ID: Abx for LUQ abscess with 3 drains Afeb, WBC 10.5 up. Scr WNL. No cultures  Vanc 1/15 >> Zosyn 1/15 >>  1/15 COVID >> neg  Vancomycin 1250 mg IV Q 24 hrs. Goal AUC 400-550. Expected AUC: 409 SCr used: 0.86, VD 0.5  Plan: - Cont Vanc 1250 mg/24h . Could this be discontinued? - Cont Zosyn 3.375g EI q8h    Height: 5\' 4"  (162.6 cm) Weight: 227 lb 4.7 oz (103.1 kg) IBW/kg (Calculated) : 54.7  Temp (24hrs), Avg:98.4 F (36.9 C), Min:98.1 F (36.7 C), Max:99 F (37.2 C)  Recent Labs  Lab 02/15/19 1123 02/15/19 1855 02/16/19 0436 02/17/19 0344 02/18/19 0300  WBC 14.4* 11.3* 10.2 8.7 10.5  CREATININE 1.01* 0.91 0.89 0.86 0.87    Estimated Creatinine Clearance: 88.5 mL/min (by C-G formula based on SCr of 0.87 mg/dL).    Allergies  Allergen Reactions  . Ciprofloxacin Hives    Terrilynn Postell S. Alford Highland, PharmD, BCPS Clinical Staff Pharmacist [email or web address]  Wayland Salinas 02/18/2019 10:22 AM

## 2019-02-19 LAB — GLUCOSE, CAPILLARY: Glucose-Capillary: 133 mg/dL — ABNORMAL HIGH (ref 70–99)

## 2019-02-19 MED ORDER — MEGESTROL ACETATE 400 MG/10ML PO SUSP: 200.0000 mg | Freq: Every day | ORAL | 0 refills | Status: DC

## 2019-02-19 MED ORDER — SODIUM CHLORIDE 0.9% FLUSH: 5.0000 mL | Freq: Three times a day (TID) | INTRAVENOUS | 0 refills | Status: DC

## 2019-02-19 MED ORDER — POTASSIUM CHLORIDE CRYS ER 10 MEQ PO TBCR: 20.0000 meq | EXTENDED_RELEASE_TABLET | Freq: Two times a day (BID) | ORAL | 0 refills | Status: DC

## 2019-02-19 MED ORDER — ACETAMINOPHEN 325 MG PO TABS: 650.0000 mg | ORAL_TABLET | Freq: Four times a day (QID) | ORAL | Status: DC | PRN

## 2019-02-19 NOTE — Progress Notes (Signed)
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 Barry Dienes complicated by pancreatic leak with subsequent development of LUQ intra-abdominal fluid collection despite left surgical drain s/p LUQ drain revision in IR 02/16/2019 by Dr. Vernard Gambles. Patient awake and alert laying in bed with no complaints at this time. LUQ drain site c/d/i.   Allergies: Ciprofloxacin  Medications: Prior to Admission medications   Medication Sig Start Date End Date Taking? Authorizing Provider  blood glucose meter kit and supplies KIT Dispense based on patient and insurance preference. Use up to four times daily as directed. (FOR ICD-9 250.00, 250.01). 01/30/19  Yes Sheikh, Omair Latif, DO  clonazePAM (KLONOPIN) 0.5 MG tablet Take 0.25-0.5 mg by mouth daily as needed for anxiety.   Yes [provider]  feeding supplement, GLUCERNA SHAKE, (GLUCERNA SHAKE) LIQD Take 237 mLs by mouth 3 (three) times daily between meals. 01/29/19  Yes Stark Klein, MD  insulin glargine (LANTUS) 100 UNIT/ML injection Inject 0.25 mLs (25 Units total) into the skin 2 (two) times daily. 01/30/19  Yes Sheikh, Omair Latif, DO  Insulin Syringes, Disposable, U-100 1 ML MISC 1 Syringe by Does not apply route 2 (two) times daily. 01/30/19  Yes Sheikh, Omair Latif, DO  lisinopril-hydrochlorothiazide (ZESTORETIC) 20-12.5 MG tablet Take 1 tablet by mouth daily.   Yes [provider]  meclizine (ANTIVERT) 12.5 MG tablet Take 12.5 mg by mouth 3 (three) times daily as needed for dizziness.   Yes [provider]  Menthol, Topical Analgesic, (BIOFREEZE ROLL-ON EX) Apply 1 application topically 3 (three) times daily as needed (back pain.).   Yes [provider]  metoprolol succinate (TOPROL-XL) 50 MG 24 hr tablet Take 50 mg by mouth at bedtime. Take with or immediately following a meal.    Yes [provider]  norgestrel-ethinyl estradiol (CRYSELLE-28) 0.3-30 MG-MCG tablet Take 1 tablet by mouth at bedtime.    Yes [provider]  omeprazole (PRILOSEC) 20 MG capsule Take 20 mg by mouth daily.   Yes [provider]  oxyCODONE (OXY IR/ROXICODONE) 5 MG immediate release tablet Take 1-2 tablets (5-10 mg total) by mouth every 6 (six) hours as needed for moderate pain, severe pain or breakthrough pain. 01/19/19  Yes Stark Klein, MD  promethazine (PHENERGAN) 12.5 MG tablet Take 12.5 mg by mouth every 6 (six) hours as needed. 02/13/19  Yes [provider]  rosuvastatin (CRESTOR) 10 MG tablet Take 10 mg by mouth at bedtime.   Yes [provider]  traMADol (ULTRAM) 50 MG tablet Take 50 mg by mouth 2 (two) times daily as needed (kidney stone pain).  07/21/18  Yes [provider]  Vitamin D, Ergocalciferol, (DRISDOL) 1.25 MG (50000 UT) CAPS capsule Take 50,000 Units by mouth every Saturday.   Yes [provider]  acetaminophen (TYLENOL) 325 MG tablet Take 2 tablets (650 mg total) by mouth every 6 (six) hours as needed for mild pain (or temp > 100). 02/19/19   Stark Klein, MD  amoxicillin-clavulanate (AUGMENTIN) 875-125 MG tablet Take 1 tablet by mouth 2 (two) times daily. 02/15/19   [provider]  megestrol (MEGACE) 400 MG/10ML suspension Take 5 mLs (200 mg total) by mouth daily. 02/19/19   Stark Klein, MD  pantoprazole (PROTONIX) 40 MG  tablet Take 1 tablet (40 mg total) by mouth daily at 12 noon. Patient not taking: Reported on 02/15/2019 01/29/19   Stark Klein, MD  potassium chloride SA (KLOR-CON) 10 MEQ tablet Take 2 tablets (20 mEq total) by mouth 2 (two) times daily. 02/19/19   Stark Klein, MD  prochlorperazine (COMPAZINE) 10 MG tablet Take 1 tablet (10 mg total) by mouth  every 6 (six) hours as needed for nausea or vomiting (Use for nausea and / or vomiting unresolved with ondansetron (Zofran).). Patient not taking: Reported on 02/15/2019 01/19/19   Stark Klein, MD  sodium chloride flush (NS) 0.9 % SOLN 5 mLs by Intracatheter route every 8 (eight) hours. 02/19/19   Stark Klein, MD     Vital Signs: BP 130/79 (BP Location: Right Arm)   Pulse 89   Temp 97.7 F (36.5 C) (Oral)   Resp 18   Ht _0  (1.626 m)   Wt 227 lb 4.7 oz (103.1 kg)   SpO2 95%   BMI 39.01 kg/m   Physical Exam Vitals and nursing note reviewed.  Constitutional:      General: She is not in acute distress.    Appearance: Normal appearance.  Pulmonary:     Effort: Pulmonary effort is normal. No respiratory distress.  Abdominal:     Comments: LUQ drain site without tenderness, drainage, erythema, or active bleeding; approximately 25 cc of milky yellow fluid in gravity bag; drain flushes/aspirates without resistance.  Skin:    General: Skin is warm and dry.  Neurological:     Mental Status: She is alert and oriented to person, place, and time.  Psychiatric:        Mood and Affect: Mood normal.        Behavior: Behavior normal.     Imaging: CT ABDOMEN PELVIS WO CONTRAST  Result Date: 02/15/2019 CLINICAL DATA:  Nausea, dry heaving, diarrhea, pancreatic cancer, status post Whipple, evaluate pancreatic fluid leak EXAM: CT ABDOMEN AND PELVIS WITHOUT CONTRAST TECHNIQUE: Multidetector CT imaging of the abdomen and pelvis was performed following the standard protocol without IV contrast. COMPARISON:  08/09/2018 FINDINGS: Lower chest: Small left pleural effusion and associated atelectasis or consolidation. Hepatobiliary: No focal liver abnormality is seen. Status post cholecystectomy. No biliary dilatation. Pancreas: Postoperative findings of Whipple pancreaticoduodenectomy with pancreatic ductal stent (series 2, image 35). Spleen: Normal in size without significant abnormality.  Adrenals/Urinary Tract: Adrenal glands are unremarkable. Small nonobstructive left renal calculi bladder is unremarkable. Stomach/Bowel: Status post Whipple pancreaticoduodenectomy and gastrojejunostomy. Extensive thickening of the colon wall with adjacent fat stranding, which involves the length of the colon to the rectum. Vascular/Lymphatic: Scattered aortic atherosclerosis. No enlarged abdominal or pelvic lymph nodes. Reproductive: No mass or other significant abnormality. Other: Multiple surgical drainage catheters about the left and right abdomen, tips positioned in the vicinity of the duodenectomy site, about the gastric body, and anterior to the pancreatic tail. There is retroperitoneal fluid at the pancreatic head resection site anterior to the IVC with an adjacent drainage catheter, collection measuring approximately 5.4 x 3.0 cm (series 2, image 43). There is loculated appearing air and fluid in the left upper quadrant about the spleen and below the left hemidiaphragm, largest component of collection measuring approximately 13.1 x 10.3 cm (series 2, image 22). Extensive fat stranding of the omentum and mesenteric fat (series 2, image 48). Musculoskeletal: No acute or significant osseous findings. IMPRESSION: 1. Status post Whipple pancreaticoduodenectomy with pancreatic ductal stent in place. 2. Multiple surgical drainage catheters about the left  and right abdomen, tips positioned in the vicinity of the duodenectomy site, about the gastric body, and anterior to the pancreatic tail. 3. There is retroperitoneal fluid at the pancreatic head resection site anterior to the IVC with an adjacent drainage catheter, collection measuring approximately 5.4 x 3.0 cm (series 2, image 43). 4. There is loculated appearing air and fluid in the left upper quadrant about the spleen and below the left hemidiaphragm, largest component of collection measuring approximately 13.1 x 10.3 cm (series 2, image 22). 5. Biliary leak  or abscess are differential considerations given these postoperative findings. The presence or absence of infection is not established by CT. 6. Extensive thickening of the colon wall with adjacent fat stranding, which involves the length of the colon to the rectum, consistent with nonspecific infectious, inflammatory, or ischemic colitis. 7. Extensive fat stranding of the omentum and mesenteric fat. 8. Small left pleural effusion and associated atelectasis or consolidation. 9. Small nonobstructive left renal calculi. 10.  Aortic Atherosclerosis (ICD10-I70.0). These results will be called to the ordering clinician or representative by the Radiologist Assistant, and communication documented in the PACS or zVision Dashboard. Electronically Signed   By: Eddie Candle M.D.   On: 02/15/2019 16:04   IR Catheter Tube Change  Result Date: 02/16/2019 INDICATION: Pancreatic neuroendocrine tumor post resection. Pancreatic leak. Nausea. 13 cm loculated left upper quadrant fluid collection despite presence of surgical drain. Drain revision requested. EXAM: EXCHANGE OF PERITONEAL DRAIN UNDER FLUOROSCOPY MEDICATIONS: The patient is currently admitted to the hospital and receiving intravenous antibiotics. The antibiotics were administered within an appropriate time frame prior to the initiation of the procedure. ANESTHESIA/SEDATION: Intravenous Fentanyl 184mg and Versed '2mg'$  were administered as conscious sedation during continuous monitoring of the patient's level of consciousness and physiological / cardiorespiratory status by the radiology RN, with a total moderate sedation time of 19 minutes. COMPLICATIONS: None immediate. PROCEDURE: Informed written consent was obtained from the patient after a thorough discussion of the procedural risks, benefits and alternatives. All questions were addressed. Maximal Sterile Barrier Technique was utilized including caps, mask, sterile gowns, sterile gloves, sterile drape, hand hygiene and  skin antiseptic. A timeout was performed prior to the initiation of the procedure. The site surrounding the left surgical drain skin entry was prepped with chlorhexidine, draped in usual sterile fashion, infiltrated locally with over 1% lidocaine. Small contrast injection demonstrated communication with the left upper quadrant fluid collection. An angled glidewire was advanced through the surgical drain into the collection. The surgical drain was removed and initially a 16 French pigtail catheter was advanced but its length allowed advancement just to the margin of the collection, which was thought to be unacceptable. Therefore, this was exchanged for a long 12 French pigtail drain catheter, well formed centrally within the collection. Small contrast injection confirmed appropriate positioning. Nearly 400 mL of greenish opaque fluid were removed. The catheter was then secured externally with 0 Prolene suture and StatLock and placed to gravity drainage. The patient tolerated the procedure well. IMPRESSION: 1. Technically successful exchange and repositioning of left upper quadrant percutaneous drain catheter. Electronically Signed   By: DLucrezia EuropeM.D.   On: 02/16/2019 15:01   DG Chest Port 1 View  Result Date: 02/16/2019 CLINICAL DATA:  Tachycardia. EXAM: PORTABLE CHEST 1 VIEW COMPARISON:  One-view chest x-ray 01/27/2019 FINDINGS: The heart is enlarged, exaggerated by low lung volumes. Right lung is clear. Left basilar airspace disease is improved. Small left effusion is not excluded. Left upper quadrant drain is  noted. IMPRESSION: 1. Improving left basilar airspace disease. 2. Low lung volumes. 3. No acute cardiopulmonary disease. Electronically Signed   By: San Morelle M.D.   On: 02/16/2019 09:27    Labs:  CBC: Recent Labs    02/15/19 1855 02/16/19 0436 02/17/19 0344 02/18/19 0300  WBC 11.3* 10.2 8.7 10.5  HGB 10.4* 9.8* 9.9* 9.6*  HCT 33.7* 31.7* 31.8* 32.4*  PLT 405* 371 345 358     COAGS: Recent Labs    07/09/18 1037 01/11/19 1109 01/16/19 0610 02/15/19 1855  INR 1.0 0.9 1.3* 1.3*    BMP: Recent Labs    02/15/19 1855 02/16/19 0436 02/17/19 0344 02/18/19 0300  NA 141 141 138 140  K 2.4* 2.8* 3.4* 3.5  CL 99 105 107 109  CO2 _0 GLUCOSE 150* 142* 153* 113*  BUN _1 CALCIUM 8.5* 7.9* 7.7* 7.8*  CREATININE 0.91 0.89 0.86 0.87  GFRNONAA >60 >60 >60 >60  GFRAA >60 >60 >60 >60    LIVER FUNCTION TESTS: Recent Labs    01/24/19 0200 01/25/19 0433 02/15/19 1123 02/15/19 1855  BILITOT 0.9 0.5 0.9 0.9  AST _2 13*  ALT _3 ALKPHOS 61 68 81 68  PROT 5.8* 5.5* 8.4* 7.1  ALBUMIN 2.5* 2.2* 2.9* 2.3*    Assessment and Plan:  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 Barry Dienes complicated by pancreatic leak with subsequent development of LUQ intra-abdominal fluid collection despite left surgical drain s/p LUQ drain revision in IR 02/16/2019 by Dr. Vernard Gambles. LUQ drain stable with approximately 25 cc of milky yellow fluid in gravity bag (additional 125 cc output in past 24 hours per chart). Continue current drain management- continue with Qshift flushes/monitor of output. Further plans per [facility]- appreciate and agree with management.  If patient is to be discharged, below are discharge instructions: - Flush each drain once daily with 5-10 cc NS flush (patient will need order for flushes). - Record output from each drain once daily. - Follow-up at drain clinic 10-14 days after discharge for CT/possible drain injection (assess for possible drain removal)- order placed to facilitate this.  IR to follow.   Electronically Signed: Earley Abide, PA-C 02/19/2019, 9:33 AM   I spent a total of 25 Minutes at the the patient's bedside AND on the patient's hospital floor or unit, greater than 50% of  which was counseling/coordinating care for LUQ intra-abodminal fluid collection s/p drain placement.

## 2019-02-19 NOTE — TOC Initial Note (Signed)
Transition of Care [facility] - Initial/Assessment Note    Patient Details  Name: Leah Farmer MRN: [id number] Date of Birth: [birth year]  Transition of Care [facility]) CM/SW Contact:    Marilu Favre, RN Phone Number: 02/19/2019, 10:22 AM  Clinical Narrative:                  Patient from home with husband . Patient active with [facility] phone [phone], fax [phone].   Spoke with Melissa at [facility] and faxed orders and clinicals. Melissa aware discharge is today. Will fax discharge summary once available.  Expected Discharge Plan: [place] Barriers to Discharge: No Barriers Identified   Patient Goals and CMS Choice Patient states their goals for this hospitalization and ongoing recovery are:: to return to home CMS Medicare.gov Compare Post Acute Care list provided to:: Patient Choice offered to / list presented to : Patient  Expected Discharge Plan and Services Expected Discharge Plan: [place]   Discharge Planning Services: CM Consult Post Acute Care Choice: [place] arrangements for the past 2 months: Single Family Home Expected Discharge Date: 02/19/19               DME Arranged: N/A         HH Arranged: PT, RN HH Agency: [facility] Date HH Agency Contacted: 02/19/19 Time HH Agency Contacted: 83 Representative spoke with at [place]: [place]  Prior Living Arrangements/Services Living arrangements for the past 2 months: [place] with:: Spouse Patient language and need for interpreter reviewed:: Yes Do you feel safe going back to the place where you live?: Yes      Need for Family Participation in Patient Care: Yes (Comment) Care giver support system in place?: Yes (comment) Current home services: DME, Home PT, Home RN Criminal Activity/Legal Involvement Pertinent to Current Situation/Hospitalization: No - Comment as needed  Activities of Daily Living Home Assistive  Devices/Equipment: Eyeglasses, Environmental consultant (specify type) ADL Screening (condition at time of admission) Patient's cognitive ability adequate to safely complete daily activities?: Yes Is the patient deaf or have difficulty hearing?: No Does the patient have difficulty seeing, even when wearing glasses/contacts?: No Does the patient have difficulty concentrating, remembering, or making decisions?: No Patient able to express need for assistance with ADLs?: Yes Does the patient have difficulty dressing or bathing?: No Independently performs ADLs?: Yes (appropriate for developmental age) Does the patient have difficulty walking or climbing stairs?: Yes Weakness of Legs: Both Weakness of Arms/Hands: None  Permission Sought/Granted   Permission granted to share information with : Yes, Verbal Permission Granted     Permission granted to share info w AGENCY: [facility]        Emotional Assessment Appearance:: Appears stated age Attitude/Demeanor/Rapport: Engaged Affect (typically observed): Accepting Orientation: : Oriented to Situation, Oriented to  Time, Oriented to Place, Oriented to Self Alcohol / Substance Use: Not Applicable Psych Involvement: No (comment)  Admission diagnosis:  Pancreatic fluid leak [K86.89] Left upper quadrant abdominal abscess ([place]) [K65.1] Patient Active Problem List   Diagnosis Date Noted  . Dehydration 02/15/2019  . Left upper quadrant abdominal abscess ([place]) 02/15/2019  . OSA (obstructive sleep apnea)   . Pleural effusion, left   . Acute respiratory failure with hypoxia ([place])   . Diabetes mellitus type 2 in obese ([place]) 01/19/2019  . HCAP (healthcare-associated pneumonia) 01/19/2019  . Essential hypertension 01/19/2019  . Depression 01/19/2019  . Pancreatic cancer ([place]) 01/15/2019  .  Primary malignant neuroendocrine neoplasm of pancreas ([place]) 01/15/2019  . Encounter for screening colonoscopy 10/07/2018  . Pancreatic tumor 10/07/2018  . Nausea & vomiting  07/08/2018  . Acute flank pain 07/08/2018  . Flank pain 07/08/2018  . COVID-19 virus infection 07/08/2018  . Nephrolithiasis 07/08/2018  . Hydronephrosis, left 07/08/2018  . Acute lower UTI 07/08/2018  . Tachycardia 07/08/2018  . CAP (community acquired pneumonia) 07/08/2018  . Hypokalemia 07/08/2018  . Abnormal liver function 07/08/2018   PCP:  Margy Clarks, NP Pharmacy:   [facility] [place], [address] [place] [address] Phone: [phone] Fax: [phone]     Social Determinants of Health (SDOH) Interventions    Readmission Risk Interventions Readmission Risk Prevention Plan 01/31/2019  Transportation Screening Complete  PCP or Specialist Appt within 3-5 Days Complete  HRI or [place] Complete  Social Work Consult for [place] Planning/Counseling Complete  Palliative Care Screening Not Applicable  Medication Review Press photographer) Complete  Some recent data might be hidden

## 2019-02-19 NOTE — Discharge Summary (Signed)
Physician Discharge Summary  Patient ID: Leah Farmer MRN: [id number] DOB/AGE: 53/53/1967 53 y.o.  Admit date: 02/15/2019 Discharge date: 02/19/2019  Admission Diagnoses: Patient Active Problem List   Diagnosis Date Noted  . Dehydration 02/15/2019  . Left upper quadrant abdominal abscess ([place]) 02/15/2019  . OSA (obstructive sleep apnea)   . Pleural effusion, left   . Acute respiratory failure with hypoxia ([place])   . Diabetes mellitus type 2 in obese ([place]) 01/19/2019  . HCAP (healthcare-associated pneumonia) 01/19/2019  . Essential hypertension 01/19/2019  . Depression 01/19/2019  . Pancreatic cancer ([place]) 01/15/2019  . 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 Encounter for screening colonoscopy 10/07/2018  . Pancreatic tumor 10/07/2018  . Nausea & vomiting 07/08/2018  . Acute flank pain 07/08/2018  . Flank pain 07/08/2018  . COVID-19 virus infection 07/08/2018  . Nephrolithiasis 07/08/2018  . Hydronephrosis, left 07/08/2018  . Acute lower UTI 07/08/2018  . Tachycardia 07/08/2018  . CAP (community acquired pneumonia) 07/08/2018  . Hypokalemia 07/08/2018  . Abnormal liver function 07/08/2018    Discharge Diagnoses:  Active Problems:   Left upper quadrant abdominal abscess ([place])  and same as above  Discharged Condition: stable  Hospital Course:  Pt was admitted to the floor 02/15/2019 following outpatient CT demonstrating left upper quadrant fluid collection.  She had come to clinic that day with significant nausea/vomiting for 4-5 days.  She had a known pancreatic leak there and in the right abdomen following a whipple and a distal pancreatectomy for malignant neuroendocrine tumor of the pancreas 01/15/2019.  The drain was putting out, but the collection was quite large.  I spoke to IR and the next day she had the surgical drain switched out for a perc drain to get better drainage.  Her nausea improved and she was able to advance her diet  back. PT saw her and continued to recommend home health PT which she was getting previously.  She was on broad spectrum IV antibiotics while in house (vanc/zosyn).  This was transitioned to oral antibiotics prior to d/c which she tolerated.  She was discharged to home in stable condition.    Consults: interventional radiology  Significant Diagnostic Studies: labs: WBCs down to 10.5 prior to d/c.    Treatments: antibiotics and perc drain LUQ  Discharge Exam: Blood pressure 130/79, pulse 89, temperature 97.7 F (36.5 C), temperature source Oral, resp. rate 18, height '5\' 4"'$  (1.626 m), weight 103.1 kg, SpO2 95 %. General appearance: alert, cooperative and no distress Resp: breathing comfortably GI: soft, non distended, non tender except at drain sites.  3 drains wtih purulent fluid Extremities: +1-2 edema.    Disposition: Discharge disposition: 01-Home or Self Care       Discharge Instructions    Call MD for:  difficulty breathing, headache or visual disturbances   Complete by: As directed    Call MD for:  hives   Complete by: As directed    Call MD for:  persistant nausea and vomiting   Complete by: As directed    Call MD for:  redness, tenderness, or signs of infection (pain, swelling, redness, odor or green/yellow discharge around incision site)   Complete by: As directed    Call MD for:  severe uncontrolled pain   Complete by: As directed    Call MD for:  temperature >100.4   Complete by: As directed    Diet - low sodium heart healthy   Complete by: As directed    Increase activity  slowly   Complete by: As directed      Allergies as of 02/19/2019      Reactions   Ciprofloxacin Hives      Medication List    STOP taking these medications   prochlorperazine 10 MG tablet Commonly known as: COMPAZINE     TAKE these medications   acetaminophen 325 MG tablet Commonly known as: TYLENOL Take 2 tablets (650 mg total) by mouth every 6 (six) hours as needed for mild pain  (or temp > 100).   amoxicillin-clavulanate 875-125 MG tablet Commonly known as: AUGMENTIN Take 1 tablet by mouth 2 (two) times daily.   BIOFREEZE ROLL-ON EX Apply 1 application topically 3 (three) times daily as needed (back pain.).   blood glucose meter kit and supplies Kit Dispense based on patient and insurance preference. Use up to four times daily as directed. (FOR ICD-9 250.00, 250.01).   clonazePAM 0.5 MG tablet Commonly known as: KLONOPIN Take 0.25-0.5 mg by mouth daily as needed for anxiety.   [place]-28 0.3-30 MG-MCG tablet Generic drug: norgestrel-ethinyl estradiol Take 1 tablet by mouth at bedtime.   feeding supplement (GLUCERNA SHAKE) Liqd Take 237 mLs by mouth 3 (three) times daily between meals.   insulin glargine 100 UNIT/ML injection Commonly known as: LANTUS Inject 0.25 mLs (25 Units total) into the skin 2 (two) times daily.   Insulin Syringes (Disposable) U-100 1 ML Misc 1 Syringe by Does not apply route 2 (two) times daily.   lisinopril-hydrochlorothiazide 20-12.5 MG tablet Commonly known as: ZESTORETIC Take 1 tablet by mouth daily.   meclizine 12.5 MG tablet Commonly known as: ANTIVERT Take 12.5 mg by mouth 3 (three) times daily as needed for dizziness.   megestrol 400 MG/10ML suspension Commonly known as: MEGACE Take 5 mLs (200 mg total) by mouth daily.   metoprolol succinate 50 MG 24 hr tablet Commonly known as: TOPROL-XL Take 50 mg by mouth at bedtime. Take with or immediately following a meal.   omeprazole 20 MG capsule Commonly known as: PRILOSEC Take 20 mg by mouth daily.   oxyCODONE 5 MG immediate release tablet Commonly known as: Oxy IR/ROXICODONE Take 1-2 tablets (5-10 mg total) by mouth every 6 (six) hours as needed for moderate pain, severe pain or breakthrough pain.   pantoprazole 40 MG tablet Commonly known as: PROTONIX Take 1 tablet (40 mg total) by mouth daily at 12 noon.   potassium chloride 10 MEQ tablet Commonly known  as: KLOR-CON Take 2 tablets (20 mEq total) by mouth 2 (two) times daily.   promethazine 12.5 MG tablet Commonly known as: PHENERGAN Take 12.5 mg by mouth every 6 (six) hours as needed.   rosuvastatin 10 MG tablet Commonly known as: CRESTOR Take 10 mg by mouth at bedtime.   sodium chloride flush 0.9 % Soln Commonly known as: NS 5 mLs by Intracatheter route every 8 (eight) hours.   traMADol 50 MG tablet Commonly known as: ULTRAM Take 50 mg by mouth 2 (two) times daily as needed (kidney stone pain).   Vitamin D (Ergocalciferol) 1.25 MG (50000 UNIT) Caps capsule Commonly known as: DRISDOL Take 50,000 Units by mouth every Saturday.      Follow-up Information    Stark Klein, MD Follow up in 2 week(s).   Specialty: General Surgery Contact information: [address] [place] [address] [phone]           Signed: Stark Klein 02/19/2019, 7:49 AM

## 2019-02-19 NOTE — Progress Notes (Signed)
Leah Farmer to be D/C'd per MD order. Discussed with the patient and all questions fully answered. ? VSS, Skin clean, dry and intact without evidence of skin break down, no evidence of skin tears noted. ? Both IV catheters discontinued intact. Site without signs and symptoms of complications. Dressing and pressure applied. ? An After Visit Summary was printed and given to the patient. Patient informed where to pickup prescriptions. ? D/c education completed with patient/family including follow up instructions, medication list, d/c activities limitations if indicated, with other d/c instructions as indicated by MD - patient able to verbalize understanding, all questions fully answered.  ? Patient instructed to return to ED, call 911, or call MD for any changes in condition.  ? Patient to be escorted via [place], and D/C home via private auto with spouse.

## 2019-02-19 NOTE — Discharge Instructions (Signed)
Percutaneous Abscess Drain, Care After This sheet gives you information about how to care for yourself after your procedure. Your health care provider may also give you more specific instructions. If you have problems or questions, contact your health care provider. What can I expect after the procedure? After your procedure, it is common to have:  A small amount of bruising and discomfort in the area where the drainage tube (catheter) was placed.  Sleepiness and fatigue. This should go away after the medicines you were given have worn off. Follow these instructions at home: Incision care  Follow instructions from your health care provider about how to take care of your incision. Make sure you: ? Wash your hands with soap and water before you change your bandage (dressing). If soap and water are not available, use hand sanitizer. ? Change your dressing as told by your health care provider. ? Leave stitches (sutures), skin glue, or adhesive strips in place. These skin closures may need to stay in place for 2 weeks or longer. If adhesive strip edges start to loosen and curl up, you may trim the loose edges. Do not remove adhesive strips completely unless your health care provider tells you to do that.  Check your incision area every day for signs of infection. Check for: ? More redness, swelling, or pain. ? More fluid or blood. ? Warmth. ? Pus or a bad smell. ? Fluid leaking from around your catheter (instead of fluid draining through your catheter). Catheter care   Follow instructions from your health care provider about emptying and cleaning your catheter and collection bag. You may need to clean the catheter every day so it does not clog.  If directed, write down the following information every time you empty your bag: ? The date and time. ? The amount of drainage. General instructions  Rest at home for 1-2 days after your procedure. Return to your normal activities as told by your  health care provider.  Do not take baths, swim, or use a hot tub for 24 hours after your procedure, or until your health care provider says that this is okay.  Take over-the-counter and prescription medicines only as told by your health care provider.  Keep all follow-up visits as told by your health care provider. This is important. Contact a health care provider if:  You have less than 10 mL of drainage a day for 2-3 days in a row, or as directed by your health care provider.  You have more redness, swelling, or pain around your incision area.  You have more fluid or blood coming from your incision area.  Your incision area feels warm to the touch.  You have pus or a bad smell coming from your incision area.  You have fluid leaking from around your catheter (instead of through your catheter).  You have a fever or chills.  You have pain that does not get better with medicine. Get help right away if:  Your catheter comes out.  You suddenly stop having drainage from your catheter.  You suddenly have blood in the fluid that is draining from your catheter.  You become dizzy or you faint.  You develop a rash.  You have nausea or vomiting.  You have difficulty breathing or you feel short of breath.  You develop chest pain.  You have problems with your speech or vision.  You have trouble balancing or moving your arms or legs. Summary  It is common to have a small   amount of bruising and discomfort in the area where the drainage tube (catheter) was placed.  You may be directed to record the amount of drainage from the bag every time you empty it.  Follow instructions from your health care provider about emptying and cleaning your catheter and collection bag. This information is not intended to replace advice given to you by your health care provider. Make sure you discuss any questions you have with your health care provider. Document Revised: 12/30/2016 Document  Reviewed: 12/10/2015 Elsevier Patient Education  2020 [employer]  

## 2019-02-20 ENCOUNTER — Inpatient Hospital Stay (HOSPITAL_COMMUNITY)
Admission: EM | Admit: 2019-02-20 | Discharge: 2019-02-25 | Disposition: A | Discharge status: Home Health | Payer: BC Managed Care – PPO | Source: Home / Self Care | Attending: Student | Admitting: Student

## 2019-02-20 ENCOUNTER — Other Ambulatory Visit: Payer: Self-pay

## 2019-02-20 ENCOUNTER — Encounter (HOSPITAL_COMMUNITY): Payer: Self-pay

## 2019-02-20 ENCOUNTER — Other Ambulatory Visit: Payer: Self-pay | Admitting: General Surgery

## 2019-02-20 ENCOUNTER — Emergency Department (HOSPITAL_COMMUNITY): Payer: BC Managed Care – PPO

## 2019-02-20 DIAGNOSIS — J90 Pleural effusion, not elsewhere classified: Secondary | ICD-10-CM

## 2019-02-20 DIAGNOSIS — E1165 Type 2 diabetes mellitus with hyperglycemia: Secondary | ICD-10-CM | POA: Diagnosis present

## 2019-02-20 DIAGNOSIS — I10 Essential (primary) hypertension: Secondary | ICD-10-CM | POA: Diagnosis present

## 2019-02-20 DIAGNOSIS — I2609 Other pulmonary embolism with acute cor pulmonale: Secondary | ICD-10-CM

## 2019-02-20 DIAGNOSIS — R0602 Shortness of breath: Secondary | ICD-10-CM

## 2019-02-20 DIAGNOSIS — K651 Peritoneal abscess: Secondary | ICD-10-CM

## 2019-02-20 DIAGNOSIS — E1169 Type 2 diabetes mellitus with other specified complication: Secondary | ICD-10-CM

## 2019-02-20 DIAGNOSIS — I2699 Other pulmonary embolism without acute cor pulmonale: Secondary | ICD-10-CM

## 2019-02-20 HISTORY — DX: Pneumonia, unspecified organism: J18.9

## 2019-02-20 LAB — CBC WITH DIFFERENTIAL/PLATELET
Abs Immature Granulocytes: 0.45 10*3/uL — ABNORMAL HIGH (ref 0.00–0.07)
Basophils Absolute: 0.1 10*3/uL (ref 0.0–0.1)
Basophils Relative: 1 %
Eosinophils Absolute: 0 10*3/uL (ref 0.0–0.5)
Eosinophils Relative: 0 %
HCT: 40.7 % (ref 36.0–46.0)
Hemoglobin: 12.2 g/dL (ref 12.0–15.0)
Immature Granulocytes: 3 %
Lymphocytes Relative: 11 %
Lymphs Abs: 1.7 10*3/uL (ref 0.7–4.0)
MCH: 27.3 pg (ref 26.0–34.0)
MCHC: 30 g/dL (ref 30.0–36.0)
MCV: 91.1 fL (ref 80.0–100.0)
Monocytes Absolute: 0.7 10*3/uL (ref 0.1–1.0)
Monocytes Relative: 4 %
Neutro Abs: 13.4 10*3/uL — ABNORMAL HIGH (ref 1.7–7.7)
Neutrophils Relative %: 81 %
Platelets: 422 10*3/uL — ABNORMAL HIGH (ref 150–400)
RBC: 4.47 MIL/uL (ref 3.87–5.11)
RDW: 15.8 % — ABNORMAL HIGH (ref 11.5–15.5)
WBC: 16.3 10*3/uL — ABNORMAL HIGH (ref 4.0–10.5)
nRBC: 0 % (ref 0.0–0.2)

## 2019-02-20 LAB — LACTIC ACID, PLASMA: Lactic Acid, Venous: 1.6 mmol/L (ref 0.5–1.9)

## 2019-02-20 LAB — PHOSPHORUS: Phosphorus: 3.7 mg/dL (ref 2.5–4.6)

## 2019-02-20 LAB — COMPREHENSIVE METABOLIC PANEL
ALT: 32 U/L (ref 0–44)
AST: 36 U/L (ref 15–41)
Albumin: 2.8 g/dL — ABNORMAL LOW (ref 3.5–5.0)
Alkaline Phosphatase: 90 U/L (ref 38–126)
Anion gap: 14 (ref 5–15)
BUN: 9 mg/dL (ref 6–20)
CO2: 20 mmol/L — ABNORMAL LOW (ref 22–32)
Calcium: 8.7 mg/dL — ABNORMAL LOW (ref 8.9–10.3)
Chloride: 106 mmol/L (ref 98–111)
Creatinine, Ser: 0.84 mg/dL (ref 0.44–1.00)
GFR calc Af Amer: >60 mL/min (ref >60)
GFR calc non Af Amer: >60 mL/min (ref >60)
Glucose, Bld: 169 mg/dL — ABNORMAL HIGH (ref 70–99)
Potassium: 3.6 mmol/L (ref 3.5–5.1)
Sodium: 140 mmol/L (ref 135–145)
Total Bilirubin: 0.7 mg/dL (ref 0.3–1.2)
Total Protein: 7.6 g/dL (ref 6.5–8.1)

## 2019-02-20 LAB — BRAIN NATRIURETIC PEPTIDE: B Natriuretic Peptide: 118 pg/mL — ABNORMAL HIGH (ref 0.0–100.0)

## 2019-02-20 LAB — MAGNESIUM: Magnesium: 1.6 mg/dL — ABNORMAL LOW (ref 1.7–2.4)

## 2019-02-20 LAB — TROPONIN I (HIGH SENSITIVITY): Troponin I (High Sensitivity): 417 ng/L (ref <18)

## 2019-02-20 LAB — LIPASE, BLOOD: Lipase: 26 U/L (ref 11–51)

## 2019-02-20 MED ORDER — HEPARIN SODIUM (PORCINE) 5000 UNIT/ML IJ SOLN: 4000.0000 [IU] | Freq: Once | INTRAMUSCULAR | Status: DC

## 2019-02-20 MED ORDER — SODIUM CHLORIDE 0.9 % IV SOLN: INTRAVENOUS | Status: DC

## 2019-02-20 MED ORDER — HEPARIN (PORCINE) 25000 UT/250ML-% IV SOLN: 14.0000 [IU]/kg/h | INTRAVENOUS | Status: DC

## 2019-02-20 MED ORDER — ONDANSETRON HCL 4 MG PO TABS
4.0000 mg | ORAL_TABLET | Freq: Four times a day (QID) | ORAL | Status: DC | PRN
  Administered 2019-02-23 – 2019-02-24 (×2): 4 mg via ORAL
  Filled 2019-02-20 (×2): qty 1

## 2019-02-20 MED ORDER — HEPARIN (PORCINE) 25000 UT/250ML-% IV SOLN
2300.0000 [IU]/h | INTRAVENOUS | Status: DC
  Administered 2019-02-20: 1500 [IU]/h via INTRAVENOUS
  Administered 2019-02-21: 1900 [IU]/h via INTRAVENOUS
  Filled 2019-02-20 (×3): qty 250

## 2019-02-20 MED ORDER — INSULIN ASPART 100 UNIT/ML ~~LOC~~ SOLN
0.0000 [IU] | Freq: Three times a day (TID) | SUBCUTANEOUS | Status: DC
  Administered 2019-02-21 (×3): 2 [IU] via SUBCUTANEOUS
  Administered 2019-02-22: 3 [IU] via SUBCUTANEOUS
  Administered 2019-02-23: 2 [IU] via SUBCUTANEOUS

## 2019-02-20 MED ORDER — HEPARIN BOLUS VIA INFUSION
5000.0000 [IU] | Freq: Once | INTRAVENOUS | Status: AC
  Administered 2019-02-20: 5000 [IU] via INTRAVENOUS

## 2019-02-20 MED ORDER — MAGNESIUM SULFATE 2 GM/50ML IV SOLN
2.0000 g | Freq: Once | INTRAVENOUS | Status: AC
  Administered 2019-02-20: 2 g via INTRAVENOUS
  Filled 2019-02-20: qty 50

## 2019-02-20 MED ORDER — IOHEXOL 350 MG/ML SOLN
100.0000 mL | Freq: Once | INTRAVENOUS | Status: AC | PRN
  Administered 2019-02-20: 100 mL via INTRAVENOUS

## 2019-02-20 MED ORDER — LORAZEPAM 2 MG/ML IJ SOLN
0.5000 mg | Freq: Once | INTRAMUSCULAR | Status: AC
  Administered 2019-02-20: 0.5 mg via INTRAVENOUS
  Filled 2019-02-20: qty 1

## 2019-02-20 MED ORDER — ONDANSETRON HCL 4 MG/2ML IJ SOLN: 4.0000 mg | Freq: Four times a day (QID) | INTRAMUSCULAR | Status: DC | PRN

## 2019-02-20 NOTE — ED Notes (Signed)
C/o weakness and SOB, last COVID test done at [facility] on Friday, results were negative.

## 2019-02-20 NOTE — H&P (Addendum)
History and Physical    Khamiya Varin ZHY:[id number] DOB: December 07, 1965 DOA: 02/20/2019  PCP: Margy Clarks, NP   Patient coming from: Home  I have personally briefly reviewed patient's old medical records in [place]  Chief Complaint: Shortness of breath.  HPI: Leah Farmer is a 54 y.o. female with medical history significant of anxiety, depression, osteoarthritis of the lower back, type 2 diabetes, GERD, history of urolithiasis, history of NASH, history of tachycardia who last month on January 15, 2019 underwent Whipple procedure with distal pancreatic resection due to melena and neuroendocrine tumor of the pancreas.  Postop.  Was complicated by HCAP.  She was discharged on 1/05 2020, but was readmitted overnight for reinsertion no drains from 1/19 to 02/19/2018.  She was discharged home earlier today and now is coming into the emergency department due to sudden onset dyspnea.  Her BMI is 40.83 kg/m.  She is on birth control.  She is postop.  She denies fever, chills, sore throat, rhinorrhea, hemoptysis, productive cough, chest pain, dizziness, diaphoresis, PND orthopnea.  No dysuria, frequency or hematuria.  No polyuria, polydipsia, polyphagia or blurred vision.  ED Course: Initial vital signs temperature 97.9 F, pulse 137, blood pressure 148/89 mmHg, respirations 15 and O2 sat 86% on room air.  Current O2 sat is in the mid 90s on 2 LPM via [place].  She was started on a heparin infusion.  Her white count 16.3, hemoglobin 12.2 g/dL and platelets 422.  CMP shows a CO2 of 20 mmol/L.  The rest of her electrolytes are within normal range when calcium is corrected to an albumin of 2.8 g/dL.  The rest of the hepatic functions and renal functions are within normal range.  Glucose 169 mg/dL.  Review of Systems: As per HPI otherwise 10 point review of systems negative.   Past Medical History:  Diagnosis Date   Anxiety    Arthritis 2020   lower back and hip; takes only OTC Motrin    Depression    mild and situational; not medicated   Diabetes mellitus without complication ([place])    unsure if type 1 or 2 , dx as an adult    GERD (gastroesophageal reflux disease)    History of kidney stones 07/2018   hospitalized at [facility]; passed on her own   Hypertension    NAFL (nonalcoholic fatty liver) 29/52/8413   fatty liver seen on CT scan abd/ pelvis   Nephrolithiasis    lov in june 2020 , also had covid at this time    pancreatic ca dx'd 08/2018   pancreatic   PONV (postoperative nausea and vomiting)    Tachycardia    generally runs in the 120s    Past Surgical History:  Procedure Laterality Date   APPENDECTOMY  in 9s   CHOLECYSTECTOMY  in 20s   ESOPHAGOGASTRODUODENOSCOPY (EGD) WITH PROPOFOL N/A 11/01/2018   Procedure: ESOPHAGOGASTRODUODENOSCOPY (EGD) WITH PROPOFOL;  Surgeon: Milus Banister, MD;  Location: [facility];  Service: Endoscopy;  Laterality: N/A;   EUS N/A 11/01/2018   Procedure: UPPER ENDOSCOPIC ULTRASOUND (EUS) LINEAR;  Surgeon: Milus Banister, MD;  Location: [facility];  Service: Endoscopy;  Laterality: N/A;   FINE NEEDLE ASPIRATION N/A 11/01/2018   Procedure: FINE NEEDLE ASPIRATION (FNA) LINEAR;  Surgeon: Milus Banister, MD;  Location: [facility];  Service: Endoscopy;  Laterality: N/A;   IR CATHETER TUBE CHANGE  02/16/2019   IR THORACENTESIS ASP PLEURAL SPACE W/IMG GUIDE  01/21/2019   LAPAROSCOPY N/A 01/15/2019  Procedure: LAPAROSCOPY DIAGNOSTIC;  Surgeon: Stark Klein, MD;  Location: [place];  Service: General;  Laterality: N/A;   WHIPPLE PROCEDURE N/A 01/15/2019   Procedure: WHIPPLE PROCEDURE;  Surgeon: Stark Klein, MD;  Location: [place];  Service: General;  Laterality: N/A;     reports that she has never smoked. She has never used smokeless tobacco. She reports that she does not drink alcohol or use drugs.  Allergies  Allergen Reactions   Ciprofloxacin Hives    Family History  Problem Relation Age of Onset    Hypertension Mother    Diabetes Mother    Pancreatic cancer Mother    Hypertension Father    Diabetes Father    Esophageal cancer Maternal Uncle        Passed at 18. Found colon cancer on colonoscopy at that time.    Colon cancer Neg Hx    Colon polyps Neg Hx    Prior to Admission medications   Medication Sig Start Date End Date Taking? Authorizing Provider  acetaminophen (TYLENOL) 325 MG tablet Take 2 tablets (650 mg total) by mouth every 6 (six) hours as needed for mild pain (or temp > 100). 02/19/19  Yes Stark Klein, MD  amoxicillin-clavulanate (AUGMENTIN) 875-125 MG tablet Take 1 tablet by mouth 2 (two) times daily. 14 day starting on 02/19/2019 02/15/19  Yes [provider]  clonazePAM (KLONOPIN) 0.5 MG tablet Take 0.25-0.5 mg by mouth daily as needed for anxiety.   Yes [provider]  feeding supplement, GLUCERNA SHAKE, (GLUCERNA SHAKE) LIQD Take 237 mLs by mouth 3 (three) times daily between meals. 01/29/19  Yes Stark Klein, MD  insulin glargine (LANTUS) 100 UNIT/ML injection Inject 0.25 mLs (25 Units total) into the skin 2 (two) times daily. 01/30/19  Yes Sheikh, Omair Latif, DO  lisinopril-hydrochlorothiazide (ZESTORETIC) 20-12.5 MG tablet Take 1 tablet by mouth daily.   Yes [provider]  meclizine (ANTIVERT) 12.5 MG tablet Take 12.5 mg by mouth 3 (three) times daily as needed for dizziness.   Yes [provider]  megestrol (MEGACE) 400 MG/10ML suspension Take 5 mLs (200 mg total) by mouth daily. 02/19/19  Yes Stark Klein, MD  Menthol, Topical Analgesic, (BIOFREEZE ROLL-ON EX) Apply 1 application topically 3 (three) times daily as needed (back pain.).   Yes [provider]  metoprolol succinate (TOPROL-XL) 50 MG 24 hr tablet Take 50 mg by mouth at bedtime. Take with or immediately following a meal.    Yes [provider]  norgestrel-ethinyl estradiol (CRYSELLE-28) 0.3-30 MG-MCG tablet Take 1 tablet by mouth at bedtime.     Yes [provider]  omeprazole (PRILOSEC) 20 MG capsule Take 20 mg by mouth daily.   Yes [provider]  oxyCODONE (OXY IR/ROXICODONE) 5 MG immediate release tablet Take 1-2 tablets (5-10 mg total) by mouth every 6 (six) hours as needed for moderate pain, severe pain or breakthrough pain. 01/19/19  Yes Stark Klein, MD  potassium chloride SA (KLOR-CON) 10 MEQ tablet Take 2 tablets (20 mEq total) by mouth 2 (two) times daily. 02/19/19  Yes Stark Klein, MD  promethazine (PHENERGAN) 12.5 MG tablet Take 12.5 mg by mouth every 6 (six) hours as needed. 02/13/19  Yes [provider]  PROMETHEGAN 25 MG suppository Place 25 mg rectally every 6 (six) hours as needed for nausea or vomiting.  02/15/19  Yes [provider]  rosuvastatin (CRESTOR) 10 MG tablet Take 10 mg by mouth at bedtime.   Yes [provider]  sodium chloride  flush (NS) 0.9 % SOLN 5 mLs by Intracatheter route every 8 (eight) hours. 02/19/19  Yes Stark Klein, MD  traMADol (ULTRAM) 50 MG tablet Take 50 mg by mouth 2 (two) times daily as needed (kidney stone pain).  07/21/18  Yes [provider]  Vitamin D, Ergocalciferol, (DRISDOL) 1.25 MG (50000 UT) CAPS capsule Take 50,000 Units by mouth every Saturday.   Yes [provider]  blood glucose meter kit and supplies KIT Dispense based on patient and insurance preference. Use up to four times daily as directed. (FOR ICD-9 250.00, 250.01). Patient not taking: Reported on 02/20/2019 01/30/19   Raiford Noble Latif, DO  Insulin Syringes, Disposable, U-100 1 ML MISC 1 Syringe by Does not apply route 2 (two) times daily. Patient not taking: Reported on 02/20/2019 01/30/19   Raiford Noble Latif, DO  pantoprazole (PROTONIX) 40 MG tablet Take 1 tablet (40 mg total) by mouth daily at 12 noon. Patient not taking: Reported on 02/15/2019 01/29/19   Stark Klein, MD    Physical Exam: Vitals:   02/20/19 2100 02/20/19 2200 02/20/19 2230 02/20/19  2242  BP: (!) 134/91 (!) 135/95 120/90   Pulse:  (!) 119 (!) 118 (!) 116  Resp: (!) 23 (!) 26 (!) 22 20  Temp:      TempSrc:      SpO2:  93% 91% 93%  Weight:      Height:        Constitutional: NAD, calm, comfortable Eyes: PERRL, lids and conjunctivae normal ENMT: Mucous membranes are moist. Posterior pharynx clear of any exudate or lesions. Neck: normal, supple, no masses, no thyromegaly Respiratory: clear to auscultation bilaterally, no wheezing, no crackles. Normal respiratory effort. No accessory muscle use.  Cardiovascular: Tachycardic at 112 bpm, no murmurs / rubs / gallops. No extremity edema. 2+ pedal pulses. No carotid bruits.  Abdomen: Mild distention.  Surgical incision scars.  Drains in place.  Soft, no tenderness, no masses palpated. No hepatosplenomegaly. Bowel sounds positive.  Musculoskeletal: no clubbing / cyanosis. Good ROM, no contractures. Normal muscle tone.  Skin: no rashes, lesions, ulcers on limited dermatological examination. Neurologic: CN 2-12 grossly intact. Sensation intact, DTR normal. Strength 5/5 in all 4.  Psychiatric: Normal judgment and insight. Alert and oriented x 3. Normal mood.   Labs on Admission: I have personally reviewed following labs and imaging studies  CBC: Recent Labs  Lab 02/15/19 1123 02/15/19 1123 02/15/19 1855 02/16/19 0436 02/17/19 0344 02/18/19 0300 02/20/19 1815  WBC 14.4*   < > 11.3* 10.2 8.7 10.5 16.3*  NEUTROABS 12.0*  --  8.1*  --  5.4 6.8 13.4*  HGB 11.6*   < > 10.4* 9.8* 9.9* 9.6* 12.2  HCT 37.4   < > 33.7* 31.7* 31.8* 32.4* 40.7  MCV 89.5   < > 87.1 89.5 89.3 90.8 91.1  PLT 438*   < > 405* 371 345 358 422*   < > = values in this interval not displayed.   Basic Metabolic Panel: Recent Labs  Lab 02/15/19 1123 02/15/19 1123 02/15/19 1855 02/16/19 0436 02/17/19 0344 02/18/19 0300 02/20/19 1815 02/20/19 2031  NA 141   < > 141 141 138 140 140  --   K 3.0*   < > 2.4* 2.8* 3.4* 3.5 3.6  --   CL 95*   < > 99  105 107 109 106  --   CO2 28   < > '26 25 23 22 ' 20*  --   GLUCOSE 202*   < >  150* 142* 153* 113* 169*  --   BUN 18   < > '14 12 10 9 9  ' --   CREATININE 1.01*   < > 0.91 0.89 0.86 0.87 0.84  --   CALCIUM 9.1   < > 8.5* 7.9* 7.7* 7.8* 8.7*  --   MG 1.7  --   --  1.4*  --   --   --  1.6*  PHOS 2.6  --   --  2.5  --   --   --  3.7   < > = values in this interval not displayed.   GFR: Estimated Creatinine Clearance: 92.6 mL/min (by C-G formula based on SCr of 0.84 mg/dL). Liver Function Tests: Recent Labs  Lab 02/15/19 1123 02/15/19 1855 02/20/19 1815  AST 15 13* 36  ALT 13 11 32  ALKPHOS 81 68 90  BILITOT 0.9 0.9 0.7  PROT 8.4* 7.1 7.6  ALBUMIN 2.9* 2.3* 2.8*   Recent Labs  Lab 02/15/19 1123 02/20/19 1815  LIPASE 24 26   No results for input(s): AMMONIA in the last 168 hours. Coagulation Profile: Recent Labs  Lab 02/15/19 1855  INR 1.3*   Cardiac Enzymes: No results for input(s): CKTOTAL, CKMB, CKMBINDEX, TROPONINI in the last 168 hours. BNP (last 3 results) No results for input(s): PROBNP in the last 8760 hours. HbA1C: No results for input(s): HGBA1C in the last 72 hours. CBG: Recent Labs  Lab 02/18/19 0649 02/18/19 1212 02/18/19 1704 02/18/19 2114 02/19/19 0732  GLUCAP 126* 156* 137* 143* 133*   Lipid Profile: No results for input(s): CHOL, HDL, LDLCALC, TRIG, CHOLHDL, LDLDIRECT in the last 72 hours. Thyroid Function Tests: No results for input(s): TSH, T4TOTAL, FREET4, T3FREE, THYROIDAB in the last 72 hours. Anemia Panel: No results for input(s): VITAMINB12, FOLATE, FERRITIN, TIBC, IRON, RETICCTPCT in the last 72 hours. Urine analysis:    Component Value Date/Time   COLORURINE YELLOW 01/19/2019 1656   APPEARANCEUR HAZY (A) 01/19/2019 1656   LABSPEC 1.025 01/19/2019 1656   PHURINE 6.0 01/19/2019 1656   GLUCOSEU NEGATIVE 01/19/2019 1656   HGBUR SMALL (A) 01/19/2019 1656   BILIRUBINUR NEGATIVE 01/19/2019 1656   KETONESUR NEGATIVE 01/19/2019 1656    PROTEINUR 100 (A) 01/19/2019 1656   NITRITE NEGATIVE 01/19/2019 1656   LEUKOCYTESUR NEGATIVE 01/19/2019 1656    Radiological Exams on Admission: CT Angio Chest PE W/Cm &/Or Wo Cm  Result Date: 02/20/2019 CLINICAL DATA:  Shortness of breath EXAM: CT ANGIOGRAPHY CHEST WITH CONTRAST TECHNIQUE: Multidetector CT imaging of the chest was performed using the standard protocol during bolus administration of intravenous contrast. Multiplanar CT image reconstructions and MIPs were obtained to evaluate the vascular anatomy. CONTRAST:  150m OMNIPAQUE IOHEXOL 350 MG/ML SOLN COMPARISON:  January 20, 2019 FINDINGS: Cardiovascular: Contrast injection is sufficient to demonstrate satisfactory opacification of the pulmonary arteries to the segmental level.There are extensive bilateral lobar, segmental, and subsegmental pulmonary emboli. The main pulmonary artery is within normal limits for size. The RV LV ratio measures approximately 1.3, consistent with right-sided heart strain. There is no evidence for a thoracic aortic dissection. The heart size is normal. There is no evidence for significant pericardial effusion. Mediastinum/Nodes: --No mediastinal or hilar lymphadenopathy. --No axillary lymphadenopathy. --No supraclavicular lymphadenopathy. --there is a thyroid nodule involving the isthmus measuring approximately 2.2 cm. --The esophagus is unremarkable Lungs/Pleura: There is a moderate-sized left-sided pleural effusion. There is left lower lobe airspace disease which may represent atelectasis or developing infiltrate. An underlying pulmonary infarct is within the  differential given the patient's extensive pulmonary emboli. There is a mosaic appearance of the lung parenchyma bilaterally likely secondary to the presence of pulmonary emboli. There is an airspace opacity at the right lung base which may represent atelectasis, infiltrate, or developing pulmonary infarct. The trachea is unremarkable. Upper Abdomen: There is  a percutaneous drain in the left upper quadrant. There is a small residual collection in this region. There is a relatively stable hypoattenuating area adjacent to the falciform ligament. Musculoskeletal: No chest wall abnormality. No acute or significant osseous findings. Review of the MIP images confirms the above findings. IMPRESSION: 1. Extensive bilateral pulmonary emboli as detailed above with CT evidence for right-sided heart strain. 2. Moderate left-sided pleural effusion. 3. Left lower lobe airspace disease may represent atelectasis, infiltrate, or developing pulmonary infarcts. There are additional airspace opacities at the right lung base with a similar differential diagnosis. 4. Mosaic appearance of the lung parenchyma bilaterally likely secondary to the presence of extensive bilateral pulmonary emboli. 5. Percutaneous drain in the left upper quadrant with significant interval improvement in the previously demonstrated collection. A small residual collection remains. 6. There is a 2.2 cm thyroid nodule in the isthmus. A follow-up thyroid ultrasound as an outpatient is recommended for further evaluation.(Ref: J Am Coll Radiol. 2015 Feb;12(2): 143-50). These results were called by telephone at the time of interpretation on 02/20/2019 at 8:19 pm to provider Fredia Sorrow , who verbally acknowledged these results. Electronically Signed   By: Constance Holster M.D.   On: 02/20/2019 20:21   DG Chest Port 1 View  Result Date: 02/20/2019 CLINICAL DATA:  Shortness of breath. EXAM: PORTABLE CHEST 1 VIEW COMPARISON:  February 16, 2019 FINDINGS: Mild infiltrate is seen within the left lung base. There is no evidence of a pleural effusion or pneumothorax. The heart size and mediastinal contours are within normal limits. The visualized skeletal structures are unremarkable. IMPRESSION: 1. Mild left basilar infiltrate. Electronically Signed   By: Virgina Norfolk M.D.   On: 02/20/2019 18:22    EKG:  Independently reviewed. Vent. rate 135 BPM PR interval * ms QRS duration 84 ms QT/QTc 280/420 ms P-R-T axes 50 -16 170 Sinus tachycardia Multiform ventricular premature complexes Low voltage, precordial leads LVH with secondary repolarization abnormality  Assessment/Plan Principal Problem:   Bilateral pulmonary embolism (HCC) Admit to progressive unit/inpatient. Continue supplemental oxygen.   Continue heparin infusion. Trend troponin level. Get echocardiogram. Consult IR due to right heart strain.  Active Problems:   Diabetes mellitus type 2 in obese (HCC) N.p.o. for now. Continue Lantus 25 units q. twice daily CBG monitoring 4 times daily.    Essential hypertension Hold lisinopril and HCTZ. Continue metoprolol. Monitor blood pressure. Monitor renal function electrolytes.    Hypomagnesemia Replaced. Follow-up magnesium level as needed.   DVT prophylaxis: On heparin infusion. Code Status: Full code. Family Communication: Her husband was by bedside. Disposition Plan: Transfer to [facility] for further treatment and work-up. Consults called: Admission status: Inpatient/progressive unit.   Reubin Milan MD [facility]  If 7PM-7AM, please contact night-coverage [email or web address]  02/20/2019, 10:57 PM   This document was prepared using Dragon voice recognition software and may contain some unintended transcription errors.

## 2019-02-20 NOTE — Progress Notes (Signed)
ANTICOAGULATION CONSULT NOTE - Initial Consult  Pharmacy Consult for Heparin Indication: pulmonary embolus  Allergies  Allergen Reactions  . Ciprofloxacin Hives    Patient Measurements: Height: 5\' 4"  (162.6 cm) Weight: 232 lb (105.2 kg) IBW/kg (Calculated) : 54.7 Heparin Dosing Weight: 82 kg  Vital Signs: Temp: 97.9 F (36.6 C) (01/20 1647) Temp Source: Oral (01/20 1647) BP: 119/71 (01/20 2000) Pulse Rate: 122 (01/20 2000)  Labs: Recent Labs    02/18/19 0300 02/20/19 1815  HGB 9.6* 12.2  HCT 32.4* 40.7  PLT 358 422*  CREATININE 0.87 0.84    Estimated Creatinine Clearance: 92.6 mL/min (by C-G formula based on SCr of 0.84 mg/dL).   Medical History: Past Medical History:  Diagnosis Date  . Anxiety   . Arthritis 2020   lower back and hip; takes only OTC Motrin  . Depression    mild and situational; not medicated  . Diabetes mellitus without complication ([place])    unsure if type 1 or 2 , dx as an adult   . GERD (gastroesophageal reflux disease)   . History of kidney stones 07/2018   hospitalized at [facility]; passed on her own  . Hypertension   . NAFL (nonalcoholic fatty liver) 0000000   fatty liver seen on CT scan abd/ pelvis  . Nephrolithiasis    lov in june 2020 , also had covid at this time   . pancreatic ca dx'd 08/2018   pancreatic  . PONV (postoperative nausea and vomiting)   . Tachycardia    generally runs in the 120s   Assessment: 54 year old female to begin heparin for bilateral pulmonary emboli with evidence of heart strain.    Goal of Therapy:  Heparin level 0.3-0.7 units/ml Monitor platelets by anticoagulation protocol: Yes   Plan:  Heparin 5000 units iv bolus x 1 Heparin drip at 1500 units / hr Heparin level and CBC at 4 am Daily heparin level, CBC starting 1/22  Thank you Anette Guarneri, PharmD 02/20/2019,8:32 PM

## 2019-02-20 NOTE — ED Notes (Signed)
Date and time results received: 02/20/19 2300 (use smartphrase ".now" to insert current time)  Test: Troponin Critical Value: 417  Name of Provider Notified: Dr Olevia Bowens  Orders Received? Or Actions Taken?: Orders Received - See Orders for details

## 2019-02-20 NOTE — ED Triage Notes (Signed)
Pt had a Whipple surgery last month. Had some increased fluid on her lungs and abdomen after the surgery. Was admitted to [facility] due to this and discharged yesterday. Is SOB now and has husband talking for her. HR 137. O2 sats 88% on RA

## 2019-02-20 NOTE — ED Provider Notes (Addendum)
[facility] EMERGENCY DEPARTMENT Provider Note   CSN: [id number] Arrival date & time: 02/20/19  1608     History Chief Complaint  Patient presents with  . Post-op Problem    Leah Farmer is a 54 y.o. female.  Patient presents with sudden onset of shortness of breath last evening following discharge from [facility].  Patient's oxygen sats on room air were 88%.  Patient started on 2 L and oxygen sats came up to the low 90s.  Patient status post Whipple procedure in December done by Dr. Barry Dienes.  Patient with a recent admission on January 15 and is stated went home on January 19 for fluid collection right upper quadrant had a readjustment and a new drain placed.  And patient was on IV antibiotics and then oral antibiotics.  Patient states from abdominal standpoint she feels much better.  Just been the sudden onset of shortness of breath.  Patient has the diagnosis of a malignant neuroendocrine neoplasm of the pancreas.  That is the reason for the Whipple procedure.  Patient denies any fevers.        Past Medical History:  Diagnosis Date  . Anxiety   . Arthritis 2020   lower back and hip; takes only OTC Motrin  . Depression    mild and situational; not medicated  . Diabetes mellitus without complication ([place])    unsure if type 1 or 2 , dx as an adult   . GERD (gastroesophageal reflux disease)   . History of kidney stones 07/2018   hospitalized at [facility]; passed on her own  . Hypertension   . NAFL (nonalcoholic fatty liver) 03/55/9741   fatty liver seen on CT scan abd/ pelvis  . Nephrolithiasis    lov in june 2020 , also had covid at this time   . pancreatic ca dx'd 08/2018   pancreatic  . Pneumonia   . PONV (postoperative nausea and vomiting)   . Tachycardia    generally runs in the 120s    Patient Active Problem List   Diagnosis Date Noted  . Bilateral pulmonary embolism ([place]) 02/20/2019  . Hypomagnesemia 02/20/2019  . Dehydration 02/15/2019  . Left upper quadrant  abdominal abscess ([place]) 02/15/2019  . OSA (obstructive sleep apnea)   . Pleural effusion, left   . Acute respiratory failure with hypoxia ([place])   . Diabetes mellitus type 2 in obese ([place]) 01/19/2019  . HCAP (healthcare-associated pneumonia) 01/19/2019  . Essential hypertension 01/19/2019  . Depression 01/19/2019  . Pancreatic cancer ([place]) 01/15/2019  . Primary malignant neuroendocrine neoplasm of pancreas ([place]) 01/15/2019  . Encounter for screening colonoscopy 10/07/2018  . Pancreatic tumor 10/07/2018  . Nausea & vomiting 07/08/2018  . Acute flank pain 07/08/2018  . Flank pain 07/08/2018  . COVID-19 virus infection 07/08/2018  . Nephrolithiasis 07/08/2018  . Hydronephrosis, left 07/08/2018  . Acute lower UTI 07/08/2018  . Tachycardia 07/08/2018  . CAP (community acquired pneumonia) 07/08/2018  . Hypokalemia 07/08/2018  . Abnormal liver function 07/08/2018    Past Surgical History:  Procedure Laterality Date  . APPENDECTOMY  in 20s  . CHOLECYSTECTOMY  in 20s  . ESOPHAGOGASTRODUODENOSCOPY (EGD) WITH PROPOFOL N/A 11/01/2018   Procedure: ESOPHAGOGASTRODUODENOSCOPY (EGD) WITH PROPOFOL;  Surgeon: Milus Banister, MD;  Location: [facility] ENDOSCOPY;  Service: Endoscopy;  Laterality: N/A;  . EUS N/A 11/01/2018   Procedure: UPPER ENDOSCOPIC ULTRASOUND (EUS) LINEAR;  Surgeon: Milus Banister, MD;  Location: [facility] ENDOSCOPY;  Service: Endoscopy;  Laterality: N/A;  . FINE NEEDLE ASPIRATION N/A  11/01/2018   Procedure: FINE NEEDLE ASPIRATION (FNA) LINEAR;  Surgeon: Milus Banister, MD;  Location: [facility] ENDOSCOPY;  Service: Endoscopy;  Laterality: N/A;  . IR CATHETER TUBE CHANGE  02/16/2019  . IR THORACENTESIS ASP PLEURAL SPACE W/IMG GUIDE  01/21/2019  . LAPAROSCOPY N/A 01/15/2019   Procedure: LAPAROSCOPY DIAGNOSTIC;  Surgeon: Stark Klein, MD;  Location: [place];  Service: General;  Laterality: N/A;  . WHIPPLE PROCEDURE N/A 01/15/2019   Procedure: WHIPPLE PROCEDURE;  Surgeon: Stark Klein, MD;  Location:  [place];  Service: General;  Laterality: N/A;     OB History   No obstetric history on file.     Family History  Problem Relation Age of Onset  . Hypertension Mother   . Diabetes Mother   . Pancreatic cancer Mother   . Hypertension Father   . Diabetes Father   . Esophageal cancer Maternal Uncle        Passed at 3. Found colon cancer on colonoscopy at that time.   . Colon cancer Neg Hx   . Colon polyps Neg Hx     Social History   Tobacco Use  . Smoking status: Never Smoker  . Smokeless tobacco: Never Used  Substance Use Topics  . Alcohol use: Never  . Drug use: Never    Home Medications Prior to Admission medications   Medication Sig Start Date End Date Taking? Authorizing Provider  acetaminophen (TYLENOL) 325 MG tablet Take 2 tablets (650 mg total) by mouth every 6 (six) hours as needed for mild pain (or temp > 100). 02/19/19  Yes Stark Klein, MD  amoxicillin-clavulanate (AUGMENTIN) 875-125 MG tablet Take 1 tablet by mouth 2 (two) times daily. 14 day starting on 02/19/2019 02/15/19  Yes [provider]  clonazePAM (KLONOPIN) 0.5 MG tablet Take 0.25-0.5 mg by mouth daily as needed for anxiety.   Yes [provider]  feeding supplement, GLUCERNA SHAKE, (GLUCERNA SHAKE) LIQD Take 237 mLs by mouth 3 (three) times daily between meals. 01/29/19  Yes Stark Klein, MD  insulin glargine (LANTUS) 100 UNIT/ML injection Inject 0.25 mLs (25 Units total) into the skin 2 (two) times daily. 01/30/19  Yes Sheikh, Omair Latif, DO  lisinopril-hydrochlorothiazide (ZESTORETIC) 20-12.5 MG tablet Take 1 tablet by mouth daily.   Yes [provider]  meclizine (ANTIVERT) 12.5 MG tablet Take 12.5 mg by mouth 3 (three) times daily as needed for dizziness.   Yes [provider]  megestrol (MEGACE) 400 MG/10ML suspension Take 5 mLs (200 mg total) by mouth daily. 02/19/19  Yes Stark Klein, MD  Menthol, Topical Analgesic, (BIOFREEZE ROLL-ON EX) Apply 1 application  topically 3 (three) times daily as needed (back pain.).   Yes [provider]  metoprolol succinate (TOPROL-XL) 50 MG 24 hr tablet Take 50 mg by mouth at bedtime. Take with or immediately following a meal.    Yes [provider]  norgestrel-ethinyl estradiol (CRYSELLE-28) 0.3-30 MG-MCG tablet Take 1 tablet by mouth at bedtime.    Yes [provider]  omeprazole (PRILOSEC) 20 MG capsule Take 20 mg by mouth daily.   Yes [provider]  oxyCODONE (OXY IR/ROXICODONE) 5 MG immediate release tablet Take 1-2 tablets (5-10 mg total) by mouth every 6 (six) hours as needed for moderate pain, severe pain or breakthrough pain. 01/19/19  Yes Stark Klein, MD  potassium chloride SA (KLOR-CON) 10 MEQ tablet Take 2 tablets (20 mEq total) by mouth 2 (two) times daily. 02/19/19  Yes Stark Klein, MD  promethazine (PHENERGAN) 12.5  MG tablet Take 12.5 mg by mouth every 6 (six) hours as needed. 02/13/19  Yes [provider]  PROMETHEGAN 25 MG suppository Place 25 mg rectally every 6 (six) hours as needed for nausea or vomiting.  02/15/19  Yes [provider]  rosuvastatin (CRESTOR) 10 MG tablet Take 10 mg by mouth at bedtime.   Yes [provider]  sodium chloride flush (NS) 0.9 % SOLN 5 mLs by Intracatheter route every 8 (eight) hours. 02/19/19  Yes Stark Klein, MD  traMADol (ULTRAM) 50 MG tablet Take 50 mg by mouth 2 (two) times daily as needed (kidney stone pain).  07/21/18  Yes [provider]  Vitamin D, Ergocalciferol, (DRISDOL) 1.25 MG (50000 UT) CAPS capsule Take 50,000 Units by mouth every Saturday.   Yes [provider]  blood glucose meter kit and supplies KIT Dispense based on patient and insurance preference. Use up to four times daily as directed. (FOR ICD-9 250.00, 250.01). Patient not taking: Reported on 02/20/2019 01/30/19   Raiford Noble Latif, DO  Insulin Syringes, Disposable, U-100 1 ML MISC 1 Syringe by Does not apply route  2 (two) times daily. Patient not taking: Reported on 02/20/2019 01/30/19   Raiford Noble Latif, DO  lipase/protease/amylase (CREON) 36000 UNITS CPEP capsule Two to three tabs with meals and 1-2 tabs with snacks. 02/22/19   Stark Klein, MD  pantoprazole (PROTONIX) 40 MG tablet Take 1 tablet (40 mg total) by mouth daily at 12 noon. Patient not taking: Reported on 02/15/2019 01/29/19   Stark Klein, MD    Allergies    Ciprofloxacin  Review of Systems   Review of Systems  Constitutional: Positive for fatigue. Negative for chills and fever.  HENT: Negative for rhinorrhea and sore throat.   Eyes: Negative for visual disturbance.  Respiratory: Positive for shortness of breath. Negative for cough.   Cardiovascular: Negative for chest pain and leg swelling.  Gastrointestinal: Negative for abdominal pain, diarrhea, nausea and vomiting.  Genitourinary: Negative for dysuria.  Musculoskeletal: Negative for back pain and neck pain.  Skin: Negative for rash.  Neurological: Negative for dizziness, light-headedness and headaches.  Hematological: Does not bruise/bleed easily.  Psychiatric/Behavioral: Negative for confusion.    Physical Exam Updated Vital Signs BP 114/74 (BP Location: Left Arm)   Pulse 98   Temp (!) 97.4 F (36.3 C) (Oral)   Resp 15   Ht 1.626 m ('5\' 4"'$ )   Wt 107.5 kg   SpO2 97%   BMI 40.68 kg/m   Physical Exam Vitals and nursing note reviewed.  Constitutional:      General: She is not in acute distress.    Appearance: Normal appearance. She is well-developed.  HENT:     Head: Normocephalic and atraumatic.  Eyes:     Extraocular Movements: Extraocular movements intact.     Conjunctiva/sclera: Conjunctivae normal.     Pupils: Pupils are equal, round, and reactive to light.  Cardiovascular:     Rate and Rhythm: Normal rate and regular rhythm.     Heart sounds: No murmur.  Pulmonary:     Effort: Respiratory distress present.     Breath sounds: Normal breath sounds.    Abdominal:     Palpations: Abdomen is soft.     Tenderness: There is no abdominal tenderness.     Comments: Drains all in place.  Abdomen slightly distended no significant tenderness.  Musculoskeletal:        General: Normal range of motion.     Cervical back: Normal  range of motion and neck supple.  Skin:    General: Skin is warm and dry.     Capillary Refill: Capillary refill takes less than 2 seconds.  Neurological:     General: No focal deficit present.     Mental Status: She is alert and oriented to person, place, and time.     ED Results / Procedures / Treatments   Labs (all labs ordered are listed, but only abnormal results are displayed) Labs Reviewed  CBC WITH DIFFERENTIAL/PLATELET - Abnormal; Notable for the following components:      Result Value   WBC 16.3 (*)    RDW 15.8 (*)    Platelets 422 (*)    Neutro Abs 13.4 (*)    Abs Immature Granulocytes 0.45 (*)    All other components within normal limits  COMPREHENSIVE METABOLIC PANEL - Abnormal; Notable for the following components:   CO2 20 (*)    Glucose, Bld 169 (*)    Calcium 8.7 (*)    Albumin 2.8 (*)    All other components within normal limits  MAGNESIUM - Abnormal; Notable for the following components:   Magnesium 1.6 (*)    All other components within normal limits  BRAIN NATRIURETIC PEPTIDE - Abnormal; Notable for the following components:   B Natriuretic Peptide 118.0 (*)    All other components within normal limits  GLUCOSE, CAPILLARY - Abnormal; Notable for the following components:   Glucose-Capillary 124 (*)    All other components within normal limits  COMPREHENSIVE METABOLIC PANEL - Abnormal; Notable for the following components:   CO2 19 (*)    Glucose, Bld 159 (*)    Calcium 8.3 (*)    Total Protein 6.2 (*)    Albumin 2.1 (*)    All other components within normal limits  HEPARIN LEVEL (UNFRACTIONATED) - Abnormal; Notable for the following components:   Heparin Unfractionated 0.13 (*)     All other components within normal limits  CBC - Abnormal; Notable for the following components:   WBC 12.2 (*)    Hemoglobin 11.4 (*)    MCHC 29.7 (*)    RDW 15.9 (*)    All other components within normal limits  HEPARIN LEVEL (UNFRACTIONATED) - Abnormal; Notable for the following components:   Heparin Unfractionated 0.12 (*)    All other components within normal limits  GLUCOSE, CAPILLARY - Abnormal; Notable for the following components:   Glucose-Capillary 138 (*)    All other components within normal limits  GLUCOSE, CAPILLARY - Abnormal; Notable for the following components:   Glucose-Capillary 132 (*)    All other components within normal limits  HEPARIN LEVEL (UNFRACTIONATED) - Abnormal; Notable for the following components:   Heparin Unfractionated 1.01 (*)    All other components within normal limits  GLUCOSE, CAPILLARY - Abnormal; Notable for the following components:   Glucose-Capillary 121 (*)    All other components within normal limits  COMPREHENSIVE METABOLIC PANEL - Abnormal; Notable for the following components:   Potassium 3.3 (*)    CO2 21 (*)    Glucose, Bld 100 (*)    Calcium 8.1 (*)    Total Protein 6.4 (*)    Albumin 2.2 (*)    All other components within normal limits  CBC WITH DIFFERENTIAL/PLATELET - Abnormal; Notable for the following components:   WBC 13.3 (*)    Hemoglobin 10.7 (*)    HCT 35.4 (*)    RDW 15.9 (*)    Neutro Abs 8.3 (*)  Monocytes Absolute 1.2 (*)    Abs Immature Granulocytes 0.36 (*)    All other components within normal limits  GLUCOSE, CAPILLARY - Abnormal; Notable for the following components:   Glucose-Capillary 107 (*)    All other components within normal limits  GLUCOSE, CAPILLARY - Abnormal; Notable for the following components:   Glucose-Capillary 113 (*)    All other components within normal limits  GLUCOSE, CAPILLARY - Abnormal; Notable for the following components:   Glucose-Capillary 160 (*)    All other  components within normal limits  TROPONIN I (HIGH SENSITIVITY) - Abnormal; Notable for the following components:   Troponin I (High Sensitivity) 417 (*)    All other components within normal limits  TROPONIN I (HIGH SENSITIVITY) - Abnormal; Notable for the following components:   Troponin I (High Sensitivity) 293 (*)    All other components within normal limits  CULTURE, BLOOD (ROUTINE X 2)  CULTURE, BLOOD (ROUTINE X 2)  SARS CORONAVIRUS 2 (TAT 6-24 HRS)  LIPASE, BLOOD  LACTIC ACID, PLASMA  PHOSPHORUS  MAGNESIUM  PHOSPHORUS  HEPARIN LEVEL (UNFRACTIONATED)  MAGNESIUM  PHOSPHORUS  HEPARIN LEVEL (UNFRACTIONATED)  CBC  PREALBUMIN    EKG EKG Interpretation  Date/Time:  Wednesday February 20 2019 17:59:54 EST Ventricular Rate:  135 PR Interval:    QRS Duration: 84 QT Interval:  280 QTC Calculation: 420 R Axis:   -16 Text Interpretation: Age not entered, assumed to be  54 years old for purpose of ECG interpretation Sinus tachycardia Multiform ventricular premature complexes Low voltage, precordial leads LVH with secondary repolarization abnormality Artifact Confirmed by Fredia Sorrow [phone]) on 02/20/2019 7:28:55 PM   Radiology CT Angio Chest PE W/Cm &/Or Wo Cm  Result Date: 02/20/2019 CLINICAL DATA:  Shortness of breath EXAM: CT ANGIOGRAPHY CHEST WITH CONTRAST TECHNIQUE: Multidetector CT imaging of the chest was performed using the standard protocol during bolus administration of intravenous contrast. Multiplanar CT image reconstructions and MIPs were obtained to evaluate the vascular anatomy. CONTRAST:  14m OMNIPAQUE IOHEXOL 350 MG/ML SOLN COMPARISON:  January 20, 2019 FINDINGS: Cardiovascular: Contrast injection is sufficient to demonstrate satisfactory opacification of the pulmonary arteries to the segmental level.There are extensive bilateral lobar, segmental, and subsegmental pulmonary emboli. The main pulmonary artery is within normal limits for size. The RV LV ratio  measures approximately 1.3, consistent with right-sided heart strain. There is no evidence for a thoracic aortic dissection. The heart size is normal. There is no evidence for significant pericardial effusion. Mediastinum/Nodes: --No mediastinal or hilar lymphadenopathy. --No axillary lymphadenopathy. --No supraclavicular lymphadenopathy. --there is a thyroid nodule involving the isthmus measuring approximately 2.2 cm. --The esophagus is unremarkable Lungs/Pleura: There is a moderate-sized left-sided pleural effusion. There is left lower lobe airspace disease which may represent atelectasis or developing infiltrate. An underlying pulmonary infarct is within the differential given the patient's extensive pulmonary emboli. There is a mosaic appearance of the lung parenchyma bilaterally likely secondary to the presence of pulmonary emboli. There is an airspace opacity at the right lung base which may represent atelectasis, infiltrate, or developing pulmonary infarct. The trachea is unremarkable. Upper Abdomen: There is a percutaneous drain in the left upper quadrant. There is a small residual collection in this region. There is a relatively stable hypoattenuating area adjacent to the falciform ligament. Musculoskeletal: No chest wall abnormality. No acute or significant osseous findings. Review of the MIP images confirms the above findings. IMPRESSION: 1. Extensive bilateral pulmonary emboli as detailed above with CT evidence for right-sided heart strain. 2.  Moderate left-sided pleural effusion. 3. Left lower lobe airspace disease may represent atelectasis, infiltrate, or developing pulmonary infarcts. There are additional airspace opacities at the right lung base with a similar differential diagnosis. 4. Mosaic appearance of the lung parenchyma bilaterally likely secondary to the presence of extensive bilateral pulmonary emboli. 5. Percutaneous drain in the left upper quadrant with significant interval improvement in  the previously demonstrated collection. A small residual collection remains. 6. There is a 2.2 cm thyroid nodule in the isthmus. A follow-up thyroid ultrasound as an outpatient is recommended for further evaluation.(Ref: J Am Coll Radiol. 2015 Feb;12(2): 143-50). These results were called by telephone at the time of interpretation on 02/20/2019 at 8:19 pm to provider Fredia Sorrow , who verbally acknowledged these results. Electronically Signed   By: Constance Holster M.D.   On: 02/20/2019 20:21   DG CHEST PORT 1 VIEW  Result Date: 02/22/2019 CLINICAL DATA:  Shortness of breath.  Abdominal drainage catheter. EXAM: PORTABLE CHEST 1 VIEW COMPARISON:  CT 02/20/2019.  Chest x-ray 02/16/2019. FINDINGS: Heart size stable. Persistent left base atelectasis and infiltrate. Persistent small left pleural effusion. No pneumothorax. Stable position left upper quadrant drainage catheter. IMPRESSION: 1. Persistent left base atelectasis and infiltrate. Persistent small left pleural effusion. No interim change. 2.  Stable position of left upper quadrant drainage catheter. Electronically Signed   By: Marcello Moores  Register   On: 02/22/2019 08:13   DG Chest Port 1 View  Result Date: 02/20/2019 CLINICAL DATA:  Shortness of breath. EXAM: PORTABLE CHEST 1 VIEW COMPARISON:  February 16, 2019 FINDINGS: Mild infiltrate is seen within the left lung base. There is no evidence of a pleural effusion or pneumothorax. The heart size and mediastinal contours are within normal limits. The visualized skeletal structures are unremarkable. IMPRESSION: 1. Mild left basilar infiltrate. Electronically Signed   By: Virgina Norfolk M.D.   On: 02/20/2019 18:22   ECHOCARDIOGRAM COMPLETE  Result Date: 02/21/2019   ECHOCARDIOGRAM REPORT   Patient Name:   AARION Escalona Date of Exam: 02/21/2019 Medical Rec #:  [id number]         Height:       64.0 in Accession #:    [id number]        Weight:       237.9 lb Date of Birth:  [birth year]        BSA:           2.11 m Patient Age:    23 years          BP:           135/92 mmHg Patient Gender: F                 HR:           114 bpm. Exam Location:  Inpatient Procedure: 2D Echo, Color Doppler, Cardiac Doppler and Intracardiac            Opacification Agent Indications:     I26.02 Pulmonary embolus  History:         Patient has no prior history of Echocardiogram examinations.                  Risk Factors:Hypertension, Diabetes and Sleep Apnea. COVID+ on                  07/08/18.  Sonographer:     Raquel Sarna Senior RDCS Referring Phys:  [id number] [place] Diagnosing Phys: Loralie Champagne MD  Sonographer Comments: Technically difficult study  due to poor echo windows and patient is morbidly obese. IMPRESSIONS  1. Left ventricular ejection fraction, by visual estimation, is 60 to 65%. The left ventricle has normal function. There is no left ventricular hypertrophy.  2. Definity contrast agent was given IV to delineate the left ventricular endocardial borders.  3. Left ventricular diastolic parameters are consistent with Grade I diastolic dysfunction (impaired relaxation).  4. The left ventricle has no regional wall motion abnormalities.  5. Poorly visualized RV. From the parasternal long axis, the RV appears dilated and hypokinetic though only a small portion of the RV can be seen. The interventricular septum was D-shaped, suggesting RV pressure/volume overload.  6. Left atrial size was normal.  7. Right atrial size was not well visualized.  8. The mitral valve is normal in structure. No evidence of mitral valve regurgitation. No evidence of mitral stenosis.  9. The tricuspid valve is not well visualized. Tricuspid valve regurgitation is not demonstrated. 10. The aortic valve is tricuspid. Aortic valve regurgitation is not visualized. No evidence of aortic valve sclerosis or stenosis. 11. The inferior vena cava is dilated in size with <50% respiratory variability, suggesting right atrial pressure of 15 mmHg. 12. TR signal is  inadequate for assessing pulmonary artery systolic pressure. 13. The interatrial septum was not well visualized. FINDINGS  Left Ventricle: Left ventricular ejection fraction, by visual estimation, is 60 to 65%. The left ventricle has normal function. Definity contrast agent was given IV to delineate the left ventricular endocardial borders. The left ventricle has no regional wall motion abnormalities. The left ventricular internal cavity size was the left ventricle is normal in size. There is no left ventricular hypertrophy. Left ventricular diastolic parameters are consistent with Grade I diastolic dysfunction (impaired relaxation). Right Ventricle: The right ventricular size is not well visualized. Right vetricular wall thickness was not assessed. Global RV systolic function is was not well visualized. Left Atrium: Left atrial size was normal in size. Right Atrium: Right atrial size was not well visualized Pericardium: There is no evidence of pericardial effusion. Mitral Valve: The mitral valve is normal in structure. No evidence of mitral valve regurgitation. No evidence of mitral valve stenosis by observation. Tricuspid Valve: The tricuspid valve is not well visualized. Tricuspid valve regurgitation is not demonstrated. Aortic Valve: The aortic valve is tricuspid. Aortic valve regurgitation is not visualized. The aortic valve is structurally normal, with no evidence of sclerosis or stenosis. Pulmonic Valve: The pulmonic valve was not well visualized. Pulmonic valve regurgitation is not visualized. Pulmonic regurgitation is not visualized. Aorta: The aortic root is normal in size and structure. Venous: The inferior vena cava is dilated in size with less than 50% respiratory variability, suggesting right atrial pressure of 15 mmHg. IAS/Shunts: The interatrial septum was not well visualized.  LEFT VENTRICLE PLAX 2D LVIDd:         4.20 cm  Diastology LVIDs:         2.90 cm  LV e' lateral:   7.51 cm/s LV PW:          1.10 cm  LV E/e' lateral: 8.7 LV IVS:        1.00 cm  LV e' medial:    6.85 cm/s LVOT diam:     2.10 cm  LV E/e' medial:  9.5 LV SV:         46 ml LV SV Index:   20.48 LVOT Area:     3.46 cm  RIGHT VENTRICLE RV S prime:  9.03 cm/s TAPSE (M-mode): 1.3 cm LEFT ATRIUM             Index LA diam:        2.70 cm 1.28 cm/m LA Vol (A2C):   41.9 ml 19.90 ml/m LA Vol (A4C):   24.6 ml 11.68 ml/m LA Biplane Vol: 34.7 ml 16.48 ml/m  AORTIC VALVE LVOT Vmax:   73.20 cm/s LVOT Vmean:  50.300 cm/s LVOT VTI:    0.113 m  AORTA Ao Root diam: 3.20 cm Ao Asc diam:  3.30 cm MITRAL VALVE MV Area (PHT): 3.68 cm             SHUNTS MV PHT:        59.74 msec           Systemic VTI:  0.11 m MV Decel Time: 206 msec             Systemic Diam: 2.10 cm MV E velocity: 65.00 cm/s 103 cm/s MV A velocity: 88.30 cm/s 70.3 cm/s MV E/A ratio:  0.74       1.5  Loralie Champagne MD Electronically signed by Loralie Champagne MD Signature Date/Time: 02/21/2019/5:17:45 PM    Final (Updated)    VAS [place] LOWER EXTREMITY VENOUS (DVT)  Result Date: 02/21/2019  Lower Venous Study Indications: Pain, Swelling, and Palpable Cord.  Risk Factors: Confirmed PE. Anticoagulation: Heparin. Comparison Study: No prior study. Performing Technologist: Baldwin Crown RDMS, RVT  Examination Guidelines: A complete evaluation includes B-mode imaging, spectral Doppler, color Doppler, and power Doppler as needed of all accessible portions of each vessel. Bilateral testing is considered an integral part of a complete examination. Limited examinations for reoccurring indications may be performed as noted.  +---------+---------------+---------+-----------+----------+-------------------+ RIGHT    CompressibilityPhasicitySpontaneityPropertiesThrombus Aging      +---------+---------------+---------+-----------+----------+-------------------+ CFV      Full           Yes      Yes                                       +---------+---------------+---------+-----------+----------+-------------------+ SFJ      Full                                                             +---------+---------------+---------+-----------+----------+-------------------+ FV Prox  Full                                                             +---------+---------------+---------+-----------+----------+-------------------+ FV Mid   Full                                                             +---------+---------------+---------+-----------+----------+-------------------+ FV DistalFull                                                             +---------+---------------+---------+-----------+----------+-------------------+  POP      None           No       No                   Acute               +---------+---------------+---------+-----------+----------+-------------------+ PTV      Full                                         visualized with                                                           color flow          +---------+---------------+---------+-----------+----------+-------------------+ PERO     None                                                             +---------+---------------+---------+-----------+----------+-------------------+   +---------+---------------+---------+-----------+----------+--------------+ LEFT     CompressibilityPhasicitySpontaneityPropertiesThrombus Aging +---------+---------------+---------+-----------+----------+--------------+ CFV      Full           Yes      Yes                                 +---------+---------------+---------+-----------+----------+--------------+ SFJ      Full                                                        +---------+---------------+---------+-----------+----------+--------------+ FV Prox  Full                                                         +---------+---------------+---------+-----------+----------+--------------+ FV Mid   Full                                                        +---------+---------------+---------+-----------+----------+--------------+ FV DistalPartial                                                     +---------+---------------+---------+-----------+----------+--------------+ PFV      Full                                                        +---------+---------------+---------+-----------+----------+--------------+  POP      None           No       No                   Acute          +---------+---------------+---------+-----------+----------+--------------+ PTV      None                                                        +---------+---------------+---------+-----------+----------+--------------+ PERO     None                                                        +---------+---------------+---------+-----------+----------+--------------+ Poorly visualized bilateral calf veins.    Summary: Right: Findings consistent with acute deep vein thrombosis involving the right popliteal vein, and right peroneal veins. No cystic structure found in the popliteal fossa. Left: Findings consistent with acute deep vein thrombosis involving the left distal femoral vein, left popliteal vein, left posterior tibial veins, and left peroneal veins. No cystic structure found in the popliteal fossa.  *See table(s) above for measurements and observations. Electronically signed by Monica Martinez MD on 02/21/2019 at 5:02:51 PM.    Final     Procedures Procedures (including critical care time)  CRITICAL CARE Performed by: Fredia Sorrow Total critical care time: 30 minutes Critical care time was exclusive of separately billable procedures and treating other patients. Critical care was necessary to treat or prevent imminent or life-threatening deterioration. Critical care was time spent  personally by me on the following activities: development of treatment plan with patient and/or surrogate as well as nursing, discussions with consultants, evaluation of patient's response to treatment, examination of patient, obtaining history from patient or surrogate, ordering and performing treatments and interventions, ordering and review of laboratory studies, ordering and review of radiographic studies, pulse oximetry and re-evaluation of patient's condition.   Medications Ordered in ED Medications  ondansetron (ZOFRAN) tablet 4 mg (has no administration in time range)    Or  ondansetron (ZOFRAN) injection 4 mg (has no administration in time range)  insulin aspart (novoLOG) injection 0-15 Units (3 Units Subcutaneous Given 02/22/19 1355)  amoxicillin-clavulanate (AUGMENTIN) 875-125 MG per tablet 1 tablet (1 tablet Oral Given 02/22/19 0852)  clonazepam (KLONOPIN) disintegrating tablet 0.25-0.5 mg (0.5 mg Oral Given 02/21/19 2150)  insulin glargine (LANTUS) injection 25 Units (25 Units Subcutaneous Given 02/22/19 0852)  meclizine (ANTIVERT) tablet 12.5 mg (has no administration in time range)  metoprolol succinate (TOPROL-XL) 24 hr tablet 50 mg (50 mg Oral Given 02/21/19 2150)  pantoprazole (PROTONIX) EC tablet 40 mg (40 mg Oral Given 02/22/19 0852)  oxyCODONE (Oxy IR/ROXICODONE) immediate release tablet 5-10 mg (has no administration in time range)  Vitamin D (Ergocalciferol) (DRISDOL) capsule 50,000 Units (has no administration in time range)  rosuvastatin (CRESTOR) tablet 10 mg (10 mg Oral Given 02/21/19 2150)  promethazine (PHENERGAN) suppository 25 mg (has no administration in time range)  lidocaine (XYLOCAINE) 1 % (with pres) injection (has no administration in time range)  perflutren lipid microspheres (DEFINITY) IV suspension (2 mLs Intravenous Given 02/21/19 1225)  heparin ADULT infusion 100 units/mL (  25000 units/267m sodium chloride 0.45%) (1,950 Units/hr Intravenous Rate/Dose Change  02/22/19 1339)  lipase/protease/amylase (CREON) capsule 72,000 Units (has no administration in time range)  lipase/protease/amylase (CREON) capsule 36,000 Units (36,000 Units Oral Given 02/22/19 1353)  feeding supplement (GLUCERNA SHAKE) (GLUCERNA SHAKE) liquid 237 mL (237 mLs Oral Given 02/22/19 1354)  feeding supplement (PRO-STAT SUGAR FREE 64) liquid 30 mL (has no administration in time range)  iohexol (OMNIPAQUE) 350 MG/ML injection 100 mL (100 mLs Intravenous Contrast Given 02/20/19 1949)  heparin bolus via infusion 5,000 Units (5,000 Units Intravenous Bolus from Bag 02/20/19 2050)  magnesium sulfate IVPB 2 g 50 mL (2 g Intravenous Transfusing/Transfer 02/20/19 2328)  LORazepam (ATIVAN) injection 0.5 mg (0.5 mg Intravenous Given 02/20/19 2327)  heparin bolus via infusion 2,000 Units (2,000 Units Intravenous Bolus from Bag 02/21/19 0636)  heparin bolus via infusion 3,000 Units (3,000 Units Intravenous Bolus from Bag 02/21/19 1349)    ED Course  I have reviewed the triage vital signs and the nursing notes.  Pertinent labs & imaging results that were available during my care of the patient were reviewed by me and considered in my medical decision making (see chart for details).    MDM Rules/Calculators/A&P                      Chest x-ray was a little equivocal.  But raise some concern may be for pneumonia.  Was certainly possible after being in the hospital.  Blood cultures lactic acid ordered based on that.  But CT angio was ordered which shows multiple bilateral PEs with an RV to LV ratio of 1.3.  Discussed with hospitalist also discussed with critical care.  Critical care felt patient could be admitted by hospitalist service.  [facility]  Patient is to have an echocardiogram in the morning.  Patient started on heparin drip.  Patient is tachycardic here requiring oxygen but blood pressures are stable.  Heart  rates around 120s.  Patient is tachypneic.  Regular Covid testing done for admission clinically not concerned about Covid infection.  Patient was negative during her recent admission.    Final Clinical Impression(s) / ED Diagnoses Final diagnoses:  Acute pulmonary embolism with acute cor pulmonale, unspecified pulmonary embolism type ([facility]    Rx / DC Orders ED Discharge Orders         Ordered    lipase/protease/amylase (CREON) 36000 UNITS CPEP capsule     02/22/19 1333           ZFredia Sorrow MD 02/22/19 1447    ZFredia Sorrow MD 02/22/19 1448

## 2019-02-21 ENCOUNTER — Inpatient Hospital Stay (HOSPITAL_COMMUNITY): Payer: BC Managed Care – PPO

## 2019-02-21 ENCOUNTER — Ambulatory Visit (HOSPITAL_COMMUNITY): Payer: BC Managed Care – PPO

## 2019-02-21 DIAGNOSIS — I2699 Other pulmonary embolism without acute cor pulmonale: Secondary | ICD-10-CM

## 2019-02-21 DIAGNOSIS — E669 Obesity, unspecified: Secondary | ICD-10-CM

## 2019-02-21 DIAGNOSIS — I10 Essential (primary) hypertension: Secondary | ICD-10-CM

## 2019-02-21 DIAGNOSIS — J90 Pleural effusion, not elsewhere classified: Secondary | ICD-10-CM

## 2019-02-21 DIAGNOSIS — M7989 Other specified soft tissue disorders: Secondary | ICD-10-CM

## 2019-02-21 DIAGNOSIS — I2602 Saddle embolus of pulmonary artery with acute cor pulmonale: Secondary | ICD-10-CM

## 2019-02-21 DIAGNOSIS — R52 Pain, unspecified: Secondary | ICD-10-CM

## 2019-02-21 DIAGNOSIS — E1169 Type 2 diabetes mellitus with other specified complication: Secondary | ICD-10-CM

## 2019-02-21 DIAGNOSIS — J9601 Acute respiratory failure with hypoxia: Secondary | ICD-10-CM

## 2019-02-21 DIAGNOSIS — I82403 Acute embolism and thrombosis of unspecified deep veins of lower extremity, bilateral: Secondary | ICD-10-CM

## 2019-02-21 DIAGNOSIS — C7A8 Other malignant neuroendocrine tumors: Secondary | ICD-10-CM

## 2019-02-21 LAB — ECHOCARDIOGRAM COMPLETE
Height: 64 in
Weight: 3806.02 oz

## 2019-02-21 LAB — COMPREHENSIVE METABOLIC PANEL
ALT: 27 U/L (ref 0–44)
AST: 30 U/L (ref 15–41)
Albumin: 2.1 g/dL — ABNORMAL LOW (ref 3.5–5.0)
Alkaline Phosphatase: 67 U/L (ref 38–126)
Anion gap: 13 (ref 5–15)
BUN: 7 mg/dL (ref 6–20)
CO2: 19 mmol/L — ABNORMAL LOW (ref 22–32)
Calcium: 8.3 mg/dL — ABNORMAL LOW (ref 8.9–10.3)
Chloride: 109 mmol/L (ref 98–111)
Creatinine, Ser: 0.88 mg/dL (ref 0.44–1.00)
GFR calc Af Amer: >60 mL/min (ref >60)
GFR calc non Af Amer: >60 mL/min (ref >60)
Glucose, Bld: 159 mg/dL — ABNORMAL HIGH (ref 70–99)
Potassium: 3.6 mmol/L (ref 3.5–5.1)
Sodium: 141 mmol/L (ref 135–145)
Total Bilirubin: 0.6 mg/dL (ref 0.3–1.2)
Total Protein: 6.2 g/dL — ABNORMAL LOW (ref 6.5–8.1)

## 2019-02-21 LAB — CBC
HCT: 38.4 % (ref 36.0–46.0)
Hemoglobin: 11.4 g/dL — ABNORMAL LOW (ref 12.0–15.0)
MCH: 27.1 pg (ref 26.0–34.0)
MCHC: 29.7 g/dL — ABNORMAL LOW (ref 30.0–36.0)
MCV: 91.2 fL (ref 80.0–100.0)
Platelets: 311 10*3/uL (ref 150–400)
RBC: 4.21 MIL/uL (ref 3.87–5.11)
RDW: 15.9 % — ABNORMAL HIGH (ref 11.5–15.5)
WBC: 12.2 10*3/uL — ABNORMAL HIGH (ref 4.0–10.5)
nRBC: 0 % (ref 0.0–0.2)

## 2019-02-21 LAB — GLUCOSE, CAPILLARY
Glucose-Capillary: 124 mg/dL — ABNORMAL HIGH (ref 70–99)
Glucose-Capillary: 138 mg/dL — ABNORMAL HIGH (ref 70–99)
Glucose-Capillary: 132 mg/dL — ABNORMAL HIGH (ref 70–99)
Glucose-Capillary: 121 mg/dL — ABNORMAL HIGH (ref 70–99)
Glucose-Capillary: 107 mg/dL — ABNORMAL HIGH (ref 70–99)

## 2019-02-21 LAB — HEPARIN LEVEL (UNFRACTIONATED)
Heparin Unfractionated: 0.13 IU/mL — ABNORMAL LOW (ref 0.30–0.70)
Heparin Unfractionated: 0.12 IU/mL — ABNORMAL LOW (ref 0.30–0.70)
Heparin Unfractionated: 1.01 IU/mL — ABNORMAL HIGH (ref 0.30–0.70)

## 2019-02-21 LAB — SARS CORONAVIRUS 2 (TAT 6-24 HRS): SARS Coronavirus 2: NEGATIVE

## 2019-02-21 LAB — TROPONIN I (HIGH SENSITIVITY): Troponin I (High Sensitivity): 293 ng/L (ref <18)

## 2019-02-21 LAB — PHOSPHORUS: Phosphorus: 3.8 mg/dL (ref 2.5–4.6)

## 2019-02-21 LAB — MAGNESIUM: Magnesium: 2 mg/dL (ref 1.7–2.4)

## 2019-02-21 MED ORDER — HEPARIN (PORCINE) 25000 UT/250ML-% IV SOLN: 1950.0000 [IU]/h | INTRAVENOUS | Status: DC

## 2019-02-21 MED ORDER — METOPROLOL SUCCINATE ER 50 MG PO TB24
50.0000 mg | ORAL_TABLET | Freq: Every day | ORAL | Status: DC
  Administered 2019-02-21 – 2019-02-24 (×4): 50 mg via ORAL
  Filled 2019-02-21 (×4): qty 1

## 2019-02-21 MED ORDER — ROSUVASTATIN CALCIUM 5 MG PO TABS
10.0000 mg | ORAL_TABLET | Freq: Every day | ORAL | Status: DC
  Administered 2019-02-21 – 2019-02-24 (×4): 10 mg via ORAL
  Filled 2019-02-21 (×4): qty 2

## 2019-02-21 MED ORDER — AMOXICILLIN-POT CLAVULANATE 875-125 MG PO TABS
1.0000 | ORAL_TABLET | Freq: Two times a day (BID) | ORAL | Status: DC
  Administered 2019-02-21 – 2019-02-25 (×9): 1 via ORAL
  Filled 2019-02-21 (×9): qty 1

## 2019-02-21 MED ORDER — OXYCODONE HCL 5 MG PO TABS: 5.0000 mg | ORAL_TABLET | Freq: Four times a day (QID) | ORAL | Status: DC | PRN

## 2019-02-21 MED ORDER — PROMETHAZINE HCL 25 MG RE SUPP: 25.0000 mg | Freq: Four times a day (QID) | RECTAL | Status: DC | PRN

## 2019-02-21 MED ORDER — CLONAZEPAM 0.125 MG PO TBDP
0.2500 mg | ORAL_TABLET | Freq: Every day | ORAL | Status: DC | PRN
  Administered 2019-02-21: 0.5 mg via ORAL
  Administered 2019-02-22 – 2019-02-24 (×3): 0.25 mg via ORAL
  Filled 2019-02-21: qty 4
  Filled 2019-02-21 (×3): qty 2

## 2019-02-21 MED ORDER — HEPARIN BOLUS VIA INFUSION
2000.0000 [IU] | Freq: Once | INTRAVENOUS | Status: AC
  Administered 2019-02-21: 2000 [IU] via INTRAVENOUS
  Filled 2019-02-21: qty 2000

## 2019-02-21 MED ORDER — PANTOPRAZOLE SODIUM 40 MG PO TBEC
40.0000 mg | DELAYED_RELEASE_TABLET | Freq: Every day | ORAL | Status: DC
  Administered 2019-02-21 – 2019-02-25 (×5): 40 mg via ORAL
  Filled 2019-02-21 (×5): qty 1

## 2019-02-21 MED ORDER — HEPARIN BOLUS VIA INFUSION
3000.0000 [IU] | Freq: Once | INTRAVENOUS | Status: AC
  Administered 2019-02-21: 3000 [IU] via INTRAVENOUS
  Filled 2019-02-21: qty 3000

## 2019-02-21 MED ORDER — PERFLUTREN LIPID MICROSPHERE
1.0000 mL | INTRAVENOUS | Status: AC | PRN
  Administered 2019-02-21: 2 mL via INTRAVENOUS
  Filled 2019-02-21: qty 10

## 2019-02-21 MED ORDER — LIDOCAINE HCL 1 % IJ SOLN
INTRAMUSCULAR | Status: AC
  Filled 2019-02-21: qty 20

## 2019-02-21 MED ORDER — MECLIZINE HCL 25 MG PO TABS
12.5000 mg | ORAL_TABLET | Freq: Three times a day (TID) | ORAL | Status: DC | PRN
  Filled 2019-02-21: qty 1

## 2019-02-21 MED ORDER — INSULIN GLARGINE 100 UNIT/ML ~~LOC~~ SOLN
25.0000 [IU] | Freq: Two times a day (BID) | SUBCUTANEOUS | Status: DC
  Administered 2019-02-21 – 2019-02-25 (×9): 25 [IU] via SUBCUTANEOUS
  Filled 2019-02-21 (×10): qty 0.25

## 2019-02-21 MED ORDER — VITAMIN D (ERGOCALCIFEROL) 1.25 MG (50000 UNIT) PO CAPS
50000.0000 [IU] | ORAL_CAPSULE | ORAL | Status: DC
  Administered 2019-02-23: 50000 [IU] via ORAL
  Filled 2019-02-21 (×2): qty 1

## 2019-02-21 NOTE — Progress Notes (Signed)
Echocardiogram 2D Echocardiogram has been performed.  Oneal Deputy Teyah Rossy 02/21/2019, 12:44 PM

## 2019-02-21 NOTE — Progress Notes (Signed)
ANTICOAGULATION CONSULT NOTE Pharmacy Consult for Heparin Indication: pulmonary embolus   Assessment: 54 year old female to begin heparin for bilateral pulmonary emboli with evidence of heart strain.   Initial heparin level 0.13 units/ml. No issues noted with infusion.  No bleeding reported  Goal of Therapy:  Heparin level 0.3-0.7 units/ml Monitor platelets by anticoagulation protocol: Yes   Plan:  Heparin 2000 units iv bolus x 1 Heparin drip at 1900 units / hr Heparin level in 6 hours Daily heparin level, CBC starting 1/22  Thank you Excell Seltzer, PharmD 02/21/2019,5:27 AM

## 2019-02-21 NOTE — TOC Benefit Eligibility Note (Signed)
Transition of Care [facility]) Benefit Eligibility Note    Patient Details  Name: Leah Farmer MRN: [id number] Date of Birth: January 11, 1966   Medication/Dose: Arne Cleveland  AND XARELTO                       Additional Notes: NO PHARMACY BENEFITS WITH [facility].    Memory Argue Phone Number: 02/21/2019, 4:28 PM

## 2019-02-21 NOTE — Progress Notes (Signed)
IR Brief Note:  IR consulted for possible PE lysis in patient recently known to IR from drain exchange 02/15/18. 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  At the time of surgery, 3 drains were left in place.  Her LUQ fluid collection was incompletely drain and drain was repositioned 02/15/18. She was discharged 02/19/19, however returned 02/20/19 with acute onset shortness of breath.   Patient found to have bilateral pulmonary embolus with right heart strain by CT.  Troponin 293.  ECHO pending.  DVT/doppler study pending.   Consultation completed by CCM earlier today and after discussion, patient has elected to hold on lysis procedure at this time in favor of conservative management due to multiple recent procedures including major abdominal surgery complicated by post-op fluid collections and recent drain exchange.   She remains on heparin.   Dr. Earleen Newport notified of patient decision.  IR remains available if patient does not improve on heparin drip.    Brynda Greathouse, MS RD PA-C 4:08 PM

## 2019-02-21 NOTE — Consult Note (Signed)
NAME:  Leah Farmer, MRN:  [id number], DOB:  [birth year], LOS: 1 ADMISSION DATE:  02/20/2019, CONSULTATION DATE:  02/21/2019 REFERRING MD:  Alfredia Ferguson, CHIEF COMPLAINT:  Submassive PE  Brief History   Submassive PE Recent Whipple 01/15/19 for neuroendocrine tumor (Curative surgery) Recent hospital admission for fluid accumulation in RUQ abd  History of present illness   Leah Farmer is a 54 y/o woman admitted with acute hypoxic respiratory failure after presenting with acute onset SOB and tachycardia. She had been discharged home the day prior and felt fine. She slept well overnight but was SOB when she was walking back from the bathroom first thing in the morning yesterday. Her husband assumed she had a panic attack, but several hours after a dose of benzos and a nap she remained dyspneic. In the ED she was found to have submassive PEs and was started on heparin. 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 She had fluid accumulation in the LUQ of the abdomen with drain relocation during her recent admission at [facility]. She has 1 left sided and 2 right sided drains with nonbloody fluid.  Past Medical History  Neuroendocrine tumor NAFL Nephrolithiasis GERD DM  Significant Hospital Events     Consults:  PCCM  Procedures:    Significant Diagnostic Tests:  CTA- bilateral PEs with RV>LV diameter, LLL airspace disease and moderate left dependent effusion  Micro Data:  sars-cov2 negative  Antimicrobials:    Interim history/subjective:    Objective   Blood pressure (!) 135/92, pulse (!) 116, temperature (!) 97.4 F (36.3 C), temperature source Oral, resp. rate 20, height _0  (1.626 m), weight 107.9 kg, SpO2 91 %.        Intake/Output Summary (Last 24 hours) at 02/21/2019 1139 Last data filed at 02/21/2019 7579 Gross per 24 hour  Intake 1156.09 ml  Output --  Net 1156.09 ml   Filed  Weights   02/20/19 1647 02/21/19 0033  Weight: 105.2 kg 107.9 kg    Examination: General: obese woman laying in bed in NAD HENT: [place]/AT, eyes anicteric Lungs: CTAB, breathing comfortably on  3L Cardiovascular: tachycardic, reg rhythm, no murmurs Abdomen: obese, soft, drains with chylous appearing output Extremities: minimal nonpitting LE edema Neuro: awake and alert, answering questions appropriately, moving all extremities spontaneously  Derm: no rashes or wounds  Resolved Hospital Problem list     Assessment & Plan:  Submassive PE- provoked with recent major surgery and malignancy. PESI   IV -Discussed risks vs benefits of catheter directed lytics for submassive PE, especially in regard to ongoing healing from her major abdominal surgery about 5 weeks ago and recent drain replacement earlier this week. She is frustrated with already having 2 post-op complications and would prefer to take a conservative approach to her PE with anticoagulation alone. -If she should develop signs of deterioration, abdominal surgery >4 weeks ago is a relative C/I rather than a strict contraindication, so thrombolytics (IV or catheter directed) could be considered. -recommend LE [place] to evaluate for residual clot burden -recommend echo -Will likely need 3-6 months AC as long as follow up testing confirms resolution of malignancy on follow up imaging. Likely will be a good candidate for eliquis. -con't telemetry monitoring  Acute hypoxic respiratory failure due to PE -titrate down FiO2 as able to maintain SpO2 >90%  Pleural effusion, likely transudative from recent surgery and L-sided abdominal fluid collection -will defer thoracentesis until she  is safe to hold [facility] or there is compelling evidence of infection to warrant the bleeding risk of thora on [facility]  Care plan was discussed at bedside with Leah Farmer and her husband. Dr. Alfredia Ferguson updated.  Labs   CBC: Recent Labs  Lab 02/15/19 1123 02/15/19 1123  02/15/19 1855 02/15/19 1855 02/16/19 0436 02/17/19 0344 02/18/19 0300 02/20/19 1815 02/21/19 0411  WBC 14.4*   < > 11.3*   < > 10.2 8.7 10.5 16.3* 12.2*  NEUTROABS 12.0*  --  8.1*  --   --  5.4 6.8 13.4*  --   HGB 11.6*   < > 10.4*   < > 9.8* 9.9* 9.6* 12.2 11.4*  HCT 37.4   < > 33.7*   < > 31.7* 31.8* 32.4* 40.7 38.4  MCV 89.5   < > 87.1   < > 89.5 89.3 90.8 91.1 91.2  PLT 438*   < > 405*   < > 371 345 358 422* 311   < > = values in this interval not displayed.    Basic Metabolic Panel: Recent Labs  Lab 02/15/19 1123 02/15/19 1855 02/16/19 0436 02/17/19 0344 02/18/19 0300 02/20/19 1815 02/20/19 2031 02/21/19 0411  NA 141   < > 141 138 140 140  --  141  K 3.0*   < > 2.8* 3.4* 3.5 3.6  --  3.6  CL 95*   < > 105 107 109 106  --  109  CO2 28   < > _0 20*  --  19*  GLUCOSE 202*   < > 142* 153* 113* 169*  --  159*  BUN 18   < > _1 --  7  CREATININE 1.01*   < > 0.89 0.86 0.87 0.84  --  0.88  CALCIUM 9.1   < > 7.9* 7.7* 7.8* 8.7*  --  8.3*  MG 1.7  --  1.4*  --   --   --  1.6* 2.0  PHOS 2.6  --  2.5  --   --   --  3.7 3.8   < > = values in this interval not displayed.   GFR: Estimated Creatinine Clearance: 89.7 mL/min (by C-G formula based on SCr of 0.88 mg/dL). Recent Labs  Lab 02/17/19 0344 02/18/19 0300 02/20/19 1815 02/20/19 2030 02/21/19 0411  WBC 8.7 10.5 16.3*  --  12.2*  LATICACIDVEN  --   --   --  1.6  --     Liver Function Tests: Recent Labs  Lab 02/15/19 1123 02/15/19 1855 02/20/19 1815 02/21/19 0411  AST 15 13* 36 30  ALT 13 11 32 27  ALKPHOS 81 68 90 67  BILITOT 0.9 0.9 0.7 0.6  PROT 8.4* 7.1 7.6 6.2*  ALBUMIN 2.9* 2.3* 2.8* 2.1*   Recent Labs  Lab 02/15/19 1123 02/20/19 1815  LIPASE 24 26   No results for input(s): AMMONIA in the last 168 hours.  ABG    Component Value Date/Time   PHART 7.432 01/21/2019 0430   PCO2ART 31.5 (L) 01/21/2019 0430   PO2ART 164.0 (H) 01/21/2019 0430   HCO3 21.0 01/21/2019 0430   TCO2 22  01/21/2019 0430   ACIDBASEDEF 3.0 (H) 01/21/2019 0430   O2SAT 100.0 01/21/2019 0430     Coagulation Profile: Recent Labs  Lab 02/15/19 1855  INR 1.3*    Cardiac Enzymes: No results for input(s): CKTOTAL, CKMB, CKMBINDEX, TROPONINI in the last 168 hours.  HbA1C: Hgb A1c MFr  Bld  Date/Time Value Ref Range Status  01/11/2019 11:10 AM 7.5 (H) 4.8 - 5.6 % Final    Comment:    (NOTE) Pre diabetes:          5.7%-6.4% Diabetes:              >6.4% Glycemic control for   <7.0% adults with diabetes     CBG: Recent Labs  Lab 02/18/19 1704 02/18/19 2114 02/19/19 0732 02/21/19 0103 02/21/19 0610  GLUCAP 137* 143* 133* 124* 138*    Review of Systems:   + SOB Recent previous nausea and vomiting resolved Otherwise negative ROS  Past Medical History  She,  has a past medical history of Anxiety, Arthritis (2020), Depression, Diabetes mellitus without complication ([place]), GERD (gastroesophageal reflux disease), History of kidney stones (07/2018), Hypertension, NAFL (nonalcoholic fatty liver) (24/40/1027), Nephrolithiasis, pancreatic ca (dx'd 08/2018), PONV (postoperative nausea and vomiting), and Tachycardia.   Surgical History    Past Surgical History:  Procedure Laterality Date  . APPENDECTOMY  in 20s  . CHOLECYSTECTOMY  in 20s  . ESOPHAGOGASTRODUODENOSCOPY (EGD) WITH PROPOFOL N/A 11/01/2018   Procedure: ESOPHAGOGASTRODUODENOSCOPY (EGD) WITH PROPOFOL;  Surgeon: Milus Banister, MD;  Location: [facility];  Service: Endoscopy;  Laterality: N/A;  . EUS N/A 11/01/2018   Procedure: UPPER ENDOSCOPIC ULTRASOUND (EUS) LINEAR;  Surgeon: Milus Banister, MD;  Location: [facility];  Service: Endoscopy;  Laterality: N/A;  . FINE NEEDLE ASPIRATION N/A 11/01/2018   Procedure: FINE NEEDLE ASPIRATION (FNA) LINEAR;  Surgeon: Milus Banister, MD;  Location: [facility];  Service: Endoscopy;  Laterality: N/A;  . IR CATHETER TUBE CHANGE  02/16/2019  . IR THORACENTESIS ASP PLEURAL SPACE W/IMG  GUIDE  01/21/2019  . LAPAROSCOPY N/A 01/15/2019   Procedure: LAPAROSCOPY DIAGNOSTIC;  Surgeon: Stark Klein, MD;  Location: [place];  Service: General;  Laterality: N/A;  . WHIPPLE PROCEDURE N/A 01/15/2019   Procedure: WHIPPLE PROCEDURE;  Surgeon: Stark Klein, MD;  Location: [place];  Service: General;  Laterality: N/A;     Social History   reports that she has never smoked. She has never used smokeless tobacco. She reports that she does not drink alcohol or use drugs.   Family History   Her family history includes Diabetes in her father and mother; Esophageal cancer in her maternal uncle; Hypertension in her father and mother; Pancreatic cancer in her mother. There is no history of Colon cancer or Colon polyps.   Allergies Allergies  Allergen Reactions  . Ciprofloxacin Hives     Home Medications  Prior to Admission medications   Medication Sig Start Date End Date Taking? Authorizing Provider  acetaminophen (TYLENOL) 325 MG tablet Take 2 tablets (650 mg total) by mouth every 6 (six) hours as needed for mild pain (or temp > 100). 02/19/19  Yes Stark Klein, MD  amoxicillin-clavulanate (AUGMENTIN) 875-125 MG tablet Take 1 tablet by mouth 2 (two) times daily. 14 day starting on 02/19/2019 02/15/19  Yes [provider]  clonazePAM (KLONOPIN) 0.5 MG tablet Take 0.25-0.5 mg by mouth daily as needed for anxiety.   Yes [provider]  feeding supplement, GLUCERNA SHAKE, (GLUCERNA SHAKE) LIQD Take 237 mLs by mouth 3 (three) times daily between meals. 01/29/19  Yes Stark Klein, MD  insulin glargine (LANTUS) 100 UNIT/ML injection Inject 0.25 mLs (25 Units total) into the skin 2 (two) times daily. 01/30/19  Yes Sheikh, Omair Latif, DO  lisinopril-hydrochlorothiazide (ZESTORETIC) 20-12.5 MG tablet Take 1 tablet by mouth daily.   Yes [provider]  meclizine (ANTIVERT) 12.5 MG tablet Take 12.5 mg by mouth 3 (three) times daily as needed for dizziness.   Yes [provider]  megestrol (MEGACE) 400 MG/10ML suspension Take 5 mLs (200 mg total) by mouth daily. 02/19/19  Yes Stark Klein, MD  Menthol, Topical Analgesic, (BIOFREEZE ROLL-ON EX) Apply 1 application topically 3 (three) times daily as needed (back pain.).   Yes [provider]  metoprolol succinate (TOPROL-XL) 50 MG 24 hr tablet Take 50 mg by mouth at bedtime. Take with or immediately following a meal.    Yes [provider]  norgestrel-ethinyl estradiol (CRYSELLE-28) 0.3-30 MG-MCG tablet Take 1 tablet by mouth at bedtime.    Yes [provider]  omeprazole (PRILOSEC) 20 MG capsule Take 20 mg by mouth daily.   Yes [provider]  oxyCODONE (OXY IR/ROXICODONE) 5 MG immediate release tablet Take 1-2 tablets (5-10 mg total) by mouth every 6 (six) hours as needed for moderate pain, severe pain or breakthrough pain. 01/19/19  Yes Stark Klein, MD  potassium chloride SA (KLOR-CON) 10 MEQ tablet Take 2 tablets (20 mEq total) by mouth 2 (two) times daily. 02/19/19  Yes Stark Klein, MD  promethazine (PHENERGAN) 12.5 MG tablet Take 12.5 mg by mouth every 6 (six) hours as needed. 02/13/19  Yes [provider]  PROMETHEGAN 25 MG suppository Place 25 mg rectally every 6 (six) hours as needed for nausea or vomiting.  02/15/19  Yes [provider]  rosuvastatin (CRESTOR) 10 MG tablet Take 10 mg by mouth at bedtime.   Yes [provider]  sodium chloride flush (NS) 0.9 % SOLN 5 mLs by Intracatheter route every 8 (eight) hours. 02/19/19  Yes Stark Klein, MD  traMADol (ULTRAM) 50 MG tablet Take 50 mg by mouth 2 (two) times daily as needed (kidney stone pain).  07/21/18  Yes [provider]  Vitamin D, Ergocalciferol, (DRISDOL) 1.25 MG (50000 UT) CAPS capsule Take 50,000 Units by mouth every Saturday.   Yes [provider]  blood glucose meter kit and supplies KIT Dispense based on patient and insurance preference. Use up to four times  daily as directed. (FOR ICD-9 250.00, 250.01). Patient not taking: Reported on 02/20/2019 01/30/19   Raiford Noble Latif, DO  Insulin Syringes, Disposable, U-100 1 ML MISC 1 Syringe by Does not apply route 2 (two) times daily. Patient not taking: Reported on 02/20/2019 01/30/19   Raiford Noble Latif, DO  pantoprazole (PROTONIX) 40 MG tablet Take 1 tablet (40 mg total) by mouth daily at 12 noon. Patient not taking: Reported on 02/15/2019 01/29/19   Stark Klein, MD      Julian Hy, DO 02/21/19 1:49 PM [facility]

## 2019-02-21 NOTE — Progress Notes (Signed)
ANTICOAGULATION CONSULT NOTE  Pharmacy Consult for Heparin Indication: pulmonary embolus 02/20/19  Patient Measurements: Height: 5\' 4"  (162.6 cm) Weight: 237 lb 14 oz (107.9 kg) IBW/kg (Calculated) : 54.7 Heparin Dosing Weight: 82 kg  Vital Signs: Temp: 98.9 F (37.2 C) (01/21 2125) Temp Source: Oral (01/21 2125) BP: 137/88 (01/21 2125) Pulse Rate: 110 (01/21 2125)  Labs: Recent Labs    02/20/19 1815 02/20/19 2031 02/21/19 0411 02/21/19 1131 02/21/19 2139  HGB 12.2  --  11.4*  --   --   HCT 40.7  --  38.4  --   --   PLT 422*  --  311  --   --   HEPARINUNFRC  --   --  0.13* 0.12* 1.01*  CREATININE 0.84  --  0.88  --   --   TROPONINIHS  --  417* 293*  --   --     Estimated Creatinine Clearance: 89.7 mL/min (by C-G formula based on SCr of 0.88 mg/dL).  Assessment: 54 year old female on heparin for acute bilateral pulmonary emboli with evidence of heart strain 02/20/19.    Heparin level up to 1.01 after several boluses and infusion increase in response to subtherapeutic levels and extensive clot burden. No bleeding per RN. Will hold heparin for 2 hours and decrease rate.    Goal of Therapy:  Heparin level 0.3-0.7 units/ml Monitor platelets by anticoagulation protocol: Yes   Plan:  -Asked RN to hold heparin for 2 hrs  -Decrease infusion to 2000 units/hr -F/u AM Heparin level wth CBC daily, monitor for bleeding -Consider change to lovenox if no procedures planned   Benetta Spar, PharmD, BCPS, [facility] Clinical Pharmacist  Please check AMION for all [place] phone numbers After 10:00 PM, call [place]

## 2019-02-21 NOTE — Progress Notes (Signed)
Bilateral lower extremity venous duplex completed.  Results given to Zigmund Daniel, RN  Preliminary results can be found under CV proc under chart review.  02/21/2019 3:29 PM  Namish Krise, K., RDMS, RVT

## 2019-02-21 NOTE — Progress Notes (Signed)
Pt. Troponin 293. On call for [facility] paged to make aware.

## 2019-02-21 NOTE — Plan of Care (Signed)
  Problem: Activity: Goal: Risk for activity intolerance will decrease Outcome: Progressing   Problem: Safety: Goal: Ability to remain free from injury will improve Outcome: Progressing   

## 2019-02-21 NOTE — Progress Notes (Signed)
PROGRESS NOTE    Leah Farmer  [id number] DOB: Feb 03, 1965 DOA: 02/20/2019 PCP: Margy Clarks, NP  Brief Narrative:  HPI per Dr. Tennis Must on 02/20/2019 Leah Farmer is a 54 y.o. female with medical history significant of anxiety, depression, osteoarthritis of the lower back, type 2 diabetes, GERD, history of urolithiasis, history of NASH, history of tachycardia who last month on January 15, 2019 underwent Whipple procedure with distal pancreatic resection due to melena and neuroendocrine tumor of the pancreas.  Postop.  Was complicated by HCAP.  She was discharged on 1/05 2020, but was readmitted overnight for reinsertion no drains from 1/19 to 02/19/2018.  She was discharged home earlier today and now is coming into the emergency department due to sudden onset dyspnea.  Her BMI is 40.83 kg/m.  She is on birth control.  She is postop.  She denies fever, chills, sore throat, rhinorrhea, hemoptysis, productive cough, chest pain, dizziness, diaphoresis, PND orthopnea.  No dysuria, frequency or hematuria.  No polyuria, polydipsia, polyphagia or blurred vision.  ED Course: Initial vital signs temperature 97.9 F, pulse 137, blood pressure 148/89 mmHg, respirations 15 and O2 sat 86% on room air.  Current O2 sat is in the mid 90s on 2 LPM via [place].  She was started on a heparin infusion.  Her white count 16.3, hemoglobin 12.2 g/dL and platelets 422.  CMP shows a CO2 of 20 mmol/L.  The rest of her electrolytes are within normal range when calcium is corrected to an albumin of 2.8 g/dL.  The rest of the hepatic functions and renal functions are within normal range.  Glucose 169 mg/dL.  **Interim History  Patient was found to have bilateral DVTs as well as bilateral PEs and started on anticoagulation.  Pulmonary and IR were consulted for further evaluation.  Currently she has chosen the conservative approach to her pulmonary embolus which is anticoagulation  Assessment & Plan:   Principal  Problem:   Bilateral pulmonary embolism (HCC) Active Problems:   Diabetes mellitus type 2 in obese [facility])   Essential hypertension   Hypomagnesemia  Acute Respiratory Failure with Hypoxia Bilateral Pulmonary Embolism with associated Bilateral DVT's -Admit to progressive unit/inpatient. -CTA showed Extensive Bilateral Pulmonary Emboli with CT evidence for Right Heart Strain and there was a Mosaic Appearance of the Lung Parenchyma bilaterally likely 2/2 to the presence of extensive bilateral Pulmonary Emboli -LE Duplex showed "Findings consistent with acute deep vein thrombosis involving the right popliteal vein, and right peroneal veins. No cystic structure found in the popliteal fossa. Left: Findings consistent with acute deep vein thrombosis involving the left distal femoral vein, left popliteal vein, left posterior tibial veins, and left peroneal veins. No cystic structure found in the popliteal fossa." -PESI Class of IV  -SARS-CoV-2 NEGATIVE  -Continue supplemental oxygen.   -Continue heparin infusion. -Trend troponin level and went from 417 -> 293 -Get Echocardiogram. -Consult IR due to right heart strain and PCCM for EKOS and Lytics Evaluation -Pulmonary elevated and discussed the risks and benefits of catheter directed lytics for submassive PE and patient prefers to take a conservative approach currently with just anticoagulation; if she develops signs of deterioration she could still have complex be considered -Will need at least 3 to 6 months of anticoagulation and likely be changed to a NOAC in the next few days -Continue supplemental oxygen via nasal cannula and wean O2 as tolerated with a goal of SPO2 greater than 90% -Continue to monitor patient's clinical response to intervention -Repeat CXR in  AM  Pleural Effusion  -Had recurrent Thoracentesis last admission -CTA showed "Moderate Left-Sided Pleural Effusion" -Consulted IR for Thoracentesis and possible EKOS but pulmonary  recommends deferring thoracentesis until she is safe to hold anticoagulation orders compelling evidence of infection toward the bleeding risk with oral and anticoagulation  Diabetes Mellitus, Type 2, uncontrolled with hyperglycemia, improved  -Patient's A1C is 7.5% from 12/11. On Janumet XR as an outpatient.  -Had worsened in setting of pancreatectomy and TPN.  -C/w Lantus 25 units sq BID and with Sensitive Novolog SSI AC -CBG's ranging from 124-138  Essential Hypertension -Continue Metoprolol Tartrate 25 mg pO BID,  -Currently holding Lisinopril 20 mg po Daily and HCTZ 12.5 mg po Daily  -Continue to Monitor BP's per Protocol   Sinus Tachycardia -Persistent. Patient is on Metoprolol 25 mg po BID but has an elevated HR in the setting of PE -Troponin I were initially elevated and trending down and went from 417 -> 293 -Check ECHOCARDIOGRAM  Primary Malignant Neuroendocrine Pancreatic Tumor -Patient is s/p pancreaticoduodenectomy and hepaticojejunostomy done by Dr. Marcello Moores.  -Has 3 drains in place -CTA showed "Percutaneous drain in the left upper quadrant with significant interval improvement in the previously demonstrated collection. A small residual collection remains." -Continue to Monitor   Normocytic Anemia  -? Dilutional Drop from IVF -Patient's Hb/Hct went from 12.2/40.7 -> 11.4/38.4 -Check Anemia Panel in AM -Repeat CBC in AM   Leukocytosis -In setting of Bilateral PE and DVT's.  -Blood cultures with no growth to date at <12 Hours  -On Augmentin from prior Whipple Surgery  -WBC went from 16.3 -> 12.2 -Continue to Monitor and Trend -Repeat CMP in Am   Hypomagnesemia, improved -Mag Level was 1.6 and improved to 2.0 -Continue to Monitor and Replete as Necessary   Metabolic Acidosis -Mild. CO2 is 19, Chloride was 109, and AG was 23 -Was getting NS at 75 mL/hr but will now stop -Continue to Monitor and Trend -Repeat CMP in AM   Anxiety -Patient states she has  panic attacks -Continue Clonazepam 0.25-0.5 mg po Dailyprn Anxiety  Right Foot Drop -Patient has a brace -Will obtain PT/OT Evaluate to Treat   Morbid Obesity -Estimated body mass index is 40.83 kg/m as calculated from the following:   Height as of this encounter: 5\' 4"  (1.626 m).   Weight as of this encounter: 107.9 kg. -Weight Loss and Dietary Counseling given   DVT prophylaxis: Anticoagulated with Heparin gtt Code Status: FULL CODE  Family Communication: No family present at bedside  Disposition Plan: Patient from Home   Consultants:   Interventional Radiology    Procedures:  LE VENOUS DUPLEX Findings consistent with acute deep vein thrombosis involving the right popliteal vein, and right peroneal veins. No cystic structure found in the popliteal fossa. Left: Findings consistent with acute deep vein thrombosis involving the left distal femoral vein, left popliteal vein, left posterior tibial veins, and left peroneal veins. No cystic structure found in the popliteal fossa.  Antimicrobials:  Anti-infectives (From admission, onward)   Start     Dose/Rate Route Frequency Ordered Stop   02/21/19 1000  amoxicillin-clavulanate (AUGMENTIN) 875-125 MG per tablet 1 tablet    Note to Pharmacy: 14 day starting on 02/19/2019     1 tablet Oral 2 times daily 02/21/19 0551       Subjective: Seen and examined at bedside and thinks that her shortness of breath is a little bit better.  Denies any lightheadedness or dizziness.  States that she is truly  short of breath yesterday.  No chest pain.  No other concerns or complaints at this time.  Objective: Vitals:   02/20/19 2300 02/21/19 0033 02/21/19 0058 02/21/19 0457  BP: 132/87 (!) 133/92 125/82 134/82  Pulse: (!) 119 (!) 116 (!) 108 (!) 103  Resp: (!) 25 18  18   Temp:  98 F (36.7 C)  98.5 F (36.9 C)  TempSrc:  Oral  Oral  SpO2: 92% 91% 97% 98%  Weight:  107.9 kg    Height:  5\' 4"  (1.626 m)      Intake/Output Summary (Last 24  hours) at 02/21/2019 0800 Last data filed at 02/21/2019 0600 Gross per 24 hour  Intake 1156.09 ml  Output --  Net 1156.09 ml   Filed Weights   02/20/19 1647 02/21/19 0033  Weight: 105.2 kg 107.9 kg   Examination: Physical Exam:  Constitutional: WN/WD obese Caucasian female in NAD and appears anxious and a little uncomfortable Eyes: Lids and conjunctivae normal, sclerae anicteric  ENMT: External Ears, Nose appear normal. Grossly normal hearing Neck: Appears normal, supple, no cervical masses, normal ROM, no appreciable thyromegaly; no JVD Respiratory: Diminished to auscultation bilaterally with coarse breath sounds, no wheezing, rales, rhonchi or crackles. Normal respiratory effort and patient is not tachypenic. No accessory muscle use.  Wearing 2 L of supplemental oxygen via nasal cannula Cardiovascular: Tachycardic rate and regular rhythm, no murmurs / rubs / gallops. S1 and S2 auscultated.  1+ extremity edema..  Abdomen: Soft, non-tender, distended secondary body habitus and she has 3 abdominal drains in place 2 on the right side and one on the left. Bowel sounds positive x4.  GU: Deferred. Musculoskeletal: No clubbing / cyanosis of digits/nails. No joint deformity upper and lower extremities but has a dropfoot on the right Skin: No rashes, lesions, ulcers on a limited skin evaluation but has a abdominal incision from her prior Whipple. No induration; Warm and dry.  Neurologic: CN 2-12 grossly intact with no focal deficits. Romberg sign and cerebellar reflexes not assessed.  Psychiatric: Normal judgment and insight. Alert and oriented x 3. Anxious mood and appropriate affect.   Data Reviewed: I have personally reviewed following labs and imaging studies  CBC: Recent Labs  Lab 02/15/19 1123 02/15/19 1123 02/15/19 1855 02/15/19 1855 02/16/19 0436 02/17/19 0344 02/18/19 0300 02/20/19 1815 02/21/19 0411  WBC 14.4*   < > 11.3*   < > 10.2 8.7 10.5 16.3* 12.2*  NEUTROABS 12.0*  --   8.1*  --   --  5.4 6.8 13.4*  --   HGB 11.6*   < > 10.4*   < > 9.8* 9.9* 9.6* 12.2 11.4*  HCT 37.4   < > 33.7*   < > 31.7* 31.8* 32.4* 40.7 38.4  MCV 89.5   < > 87.1   < > 89.5 89.3 90.8 91.1 91.2  PLT 438*   < > 405*   < > 371 345 358 422* 311   < > = values in this interval not displayed.   Basic Metabolic Panel: Recent Labs  Lab 02/15/19 1123 02/15/19 1855 02/16/19 0436 02/17/19 0344 02/18/19 0300 02/20/19 1815 02/20/19 2031 02/21/19 0411  NA 141   < > 141 138 140 140  --  141  K 3.0*   < > 2.8* 3.4* 3.5 3.6  --  3.6  CL 95*   < > 105 107 109 106  --  109  CO2 28   < > 25 23 22  20*  --  19*  GLUCOSE 202*   < > 142* 153* 113* 169*  --  159*  BUN 18   < > 12 10 9 9   --  7  CREATININE 1.01*   < > 0.89 0.86 0.87 0.84  --  0.88  CALCIUM 9.1   < > 7.9* 7.7* 7.8* 8.7*  --  8.3*  MG 1.7  --  1.4*  --   --   --  1.6*  --   PHOS 2.6  --  2.5  --   --   --  3.7  --    < > = values in this interval not displayed.   GFR: Estimated Creatinine Clearance: 89.7 mL/min (by C-G formula based on SCr of 0.88 mg/dL). Liver Function Tests: Recent Labs  Lab 02/15/19 1123 02/15/19 1855 02/20/19 1815 02/21/19 0411  AST 15 13* 36 30  ALT 13 11 32 27  ALKPHOS 81 68 90 67  BILITOT 0.9 0.9 0.7 0.6  PROT 8.4* 7.1 7.6 6.2*  ALBUMIN 2.9* 2.3* 2.8* 2.1*   Recent Labs  Lab 02/15/19 1123 02/20/19 1815  LIPASE 24 26   No results for input(s): AMMONIA in the last 168 hours. Coagulation Profile: Recent Labs  Lab 02/15/19 1855  INR 1.3*   Cardiac Enzymes: No results for input(s): CKTOTAL, CKMB, CKMBINDEX, TROPONINI in the last 168 hours. BNP (last 3 results) No results for input(s): PROBNP in the last 8760 hours. HbA1C: No results for input(s): HGBA1C in the last 72 hours. CBG: Recent Labs  Lab 02/18/19 1704 02/18/19 2114 02/19/19 0732 02/21/19 0103 02/21/19 0610  GLUCAP 137* 143* 133* 124* 138*   Lipid Profile: No results for input(s): CHOL, HDL, LDLCALC, TRIG, CHOLHDL,  LDLDIRECT in the last 72 hours. Thyroid Function Tests: No results for input(s): TSH, T4TOTAL, FREET4, T3FREE, THYROIDAB in the last 72 hours. Anemia Panel: No results for input(s): VITAMINB12, FOLATE, FERRITIN, TIBC, IRON, RETICCTPCT in the last 72 hours. Sepsis Labs: Recent Labs  Lab 02/20/19 2030  LATICACIDVEN 1.6    Recent Results (from the past 240 hour(s))  SARS CORONAVIRUS 2 (TAT 6-24 HRS) Nasopharyngeal Nasopharyngeal Swab     Status: None   Collection Time: 02/15/19  6:34 PM   Specimen: Nasopharyngeal Swab  Result Value Ref Range Status   SARS Coronavirus 2 NEGATIVE NEGATIVE Final    Comment: (NOTE) SARS-CoV-2 target nucleic acids are NOT DETECTED. The SARS-CoV-2 RNA is generally detectable in upper and lower respiratory specimens during the acute phase of infection. Negative results do not preclude SARS-CoV-2 infection, do not rule out co-infections with other pathogens, and should not be used as the sole basis for treatment or other patient management decisions. Negative results must be combined with clinical observations, patient history, and epidemiological information. The expected result is Negative. Fact Sheet for Patients: SugarRoll.be Fact Sheet for Healthcare Providers: https://www.woods-mathews.com/ This test is not yet approved or cleared by the [place] FDA and  has been authorized for detection and/or diagnosis of SARS-CoV-2 by FDA under an Emergency Use Authorization (EUA). This EUA will remain  in effect (meaning this test can be used) for the duration of the COVID-19 declaration under Section 56 4(b)(1) of the Act, 21 U.S.C. section 360bbb-3(b)(1), unless the authorization is terminated or revoked sooner. Performed at [facility], [place] [address], [place], [place] [address]   Culture, blood (Routine X 2) w Reflex to ID Panel     Status: None (Preliminary result)   Collection Time: 02/20/19  8:31 PM  Specimen: BLOOD  Result Value Ref Range Status   Specimen Description BLOOD LEFT ANTECUBITAL  Final   Special Requests   Final    BOTTLES DRAWN AEROBIC AND ANAEROBIC Blood Culture adequate volume   Culture   Final    NO GROWTH < 12 HOURS Performed at [facility], [address], [place], [place] [address]    Report Status PENDING  Incomplete  Culture, blood (Routine X 2) w Reflex to ID Panel     Status: None (Preliminary result)   Collection Time: 02/20/19  8:31 PM   Specimen: BLOOD RIGHT HAND  Result Value Ref Range Status   Specimen Description BLOOD RIGHT HAND  Final   Special Requests   Final    BOTTLES DRAWN AEROBIC AND ANAEROBIC Blood Culture adequate volume   Culture   Final    NO GROWTH < 12 HOURS Performed at [facility], [address], [place], [place] [address]    Report Status PENDING  Incomplete     Radiology Studies: CT Angio Chest PE W/Cm &/Or Wo Cm  Result Date: 02/20/2019 CLINICAL DATA:  Shortness of breath EXAM: CT ANGIOGRAPHY CHEST WITH CONTRAST TECHNIQUE: Multidetector CT imaging of the chest was performed using the standard protocol during bolus administration of intravenous contrast. Multiplanar CT image reconstructions and MIPs were obtained to evaluate the vascular anatomy. CONTRAST:  125mL OMNIPAQUE IOHEXOL 350 MG/ML SOLN COMPARISON:  January 20, 2019 FINDINGS: Cardiovascular: Contrast injection is sufficient to demonstrate satisfactory opacification of the pulmonary arteries to the segmental level.There are extensive bilateral lobar, segmental, and subsegmental pulmonary emboli. The main pulmonary artery is within normal limits for size. The RV LV ratio measures approximately 1.3, consistent with right-sided heart strain. There is no evidence for a thoracic aortic dissection. The heart size is normal. There is no evidence for significant pericardial effusion. Mediastinum/Nodes: --No mediastinal or hilar lymphadenopathy. --No axillary lymphadenopathy. --No  supraclavicular lymphadenopathy. --there is a thyroid nodule involving the isthmus measuring approximately 2.2 cm. --The esophagus is unremarkable Lungs/Pleura: There is a moderate-sized left-sided pleural effusion. There is left lower lobe airspace disease which may represent atelectasis or developing infiltrate. An underlying pulmonary infarct is within the differential given the patient's extensive pulmonary emboli. There is a mosaic appearance of the lung parenchyma bilaterally likely secondary to the presence of pulmonary emboli. There is an airspace opacity at the right lung base which may represent atelectasis, infiltrate, or developing pulmonary infarct. The trachea is unremarkable. Upper Abdomen: There is a percutaneous drain in the left upper quadrant. There is a small residual collection in this region. There is a relatively stable hypoattenuating area adjacent to the falciform ligament. Musculoskeletal: No chest wall abnormality. No acute or significant osseous findings. Review of the MIP images confirms the above findings. IMPRESSION: 1. Extensive bilateral pulmonary emboli as detailed above with CT evidence for right-sided heart strain. 2. Moderate left-sided pleural effusion. 3. Left lower lobe airspace disease may represent atelectasis, infiltrate, or developing pulmonary infarcts. There are additional airspace opacities at the right lung base with a similar differential diagnosis. 4. Mosaic appearance of the lung parenchyma bilaterally likely secondary to the presence of extensive bilateral pulmonary emboli. 5. Percutaneous drain in the left upper quadrant with significant interval improvement in the previously demonstrated collection. A small residual collection remains. 6. There is a 2.2 cm thyroid nodule in the isthmus. A follow-up thyroid ultrasound as an outpatient is recommended for further evaluation.(Ref: J Am Coll Radiol. 2015 Feb;12(2): 143-50). These results were called by telephone  at  the time of interpretation on 02/20/2019 at 8:19 pm to provider Fredia Sorrow , who verbally acknowledged these results. Electronically Signed   By: Constance Holster M.D.   On: 02/20/2019 20:21   DG Chest Port 1 View  Result Date: 02/20/2019 CLINICAL DATA:  Shortness of breath. EXAM: PORTABLE CHEST 1 VIEW COMPARISON:  February 16, 2019 FINDINGS: Mild infiltrate is seen within the left lung base. There is no evidence of a pleural effusion or pneumothorax. The heart size and mediastinal contours are within normal limits. The visualized skeletal structures are unremarkable. IMPRESSION: 1. Mild left basilar infiltrate. Electronically Signed   By: Virgina Norfolk M.D.   On: 02/20/2019 18:22   Scheduled Meds: . amoxicillin-clavulanate  1 tablet Oral BID  . insulin aspart  0-15 Units Subcutaneous TID WC  . insulin glargine  25 Units Subcutaneous BID  . metoprolol succinate  50 mg Oral QHS  . pantoprazole  40 mg Oral Daily  . rosuvastatin  10 mg Oral QHS  . [START ON 02/23/2019] Vitamin D (Ergocalciferol)  50,000 Units Oral Q Sat   Continuous Infusions: . sodium chloride 75 mL/hr at 02/20/19 2007  . heparin 1,900 Units/hr (02/21/19 [id number])    LOS: 1 day   Kerney Elbe, DO [facility] PAGER is on AMION  If 7PM-7AM, please contact night-coverage [email or web address]

## 2019-02-21 NOTE — Progress Notes (Addendum)
[place] for Heparin Indication: pulmonary embolus  Allergies  Allergen Reactions  . Ciprofloxacin Hives    Patient Measurements: Height: 5\' 4"  (162.6 cm) Weight: 237 lb 14 oz (107.9 kg) IBW/kg (Calculated) : 54.7 Heparin Dosing Weight: 82 kg  Vital Signs: Temp: 98.1 F (36.7 C) (01/21 1148) Temp Source: Oral (01/21 1148) BP: 136/87 (01/21 1148) Pulse Rate: 106 (01/21 1148)  Labs: Recent Labs    02/20/19 1815 02/20/19 2031 02/21/19 0411 02/21/19 1131  HGB 12.2  --  11.4*  --   HCT 40.7  --  38.4  --   PLT 422*  --  311  --   HEPARINUNFRC  --   --  0.13* 0.12*  CREATININE 0.84  --  0.88  --   TROPONINIHS  --  417* 293*  --     Estimated Creatinine Clearance: 89.7 mL/min (by C-G formula based on SCr of 0.88 mg/dL).   Medical History: Past Medical History:  Diagnosis Date  . Anxiety   . Arthritis 2020   lower back and hip; takes only OTC Motrin  . Depression    mild and situational; not medicated  . Diabetes mellitus without complication ([place])    unsure if type 1 or 2 , dx as an adult   . GERD (gastroesophageal reflux disease)   . History of kidney stones 07/2018   hospitalized at [facility]; passed on her own  . Hypertension   . NAFL (nonalcoholic fatty liver) 0000000   fatty liver seen on CT scan abd/ pelvis  . Nephrolithiasis    lov in june 2020 , also had covid at this time   . pancreatic ca dx'd 08/2018   pancreatic  . PONV (postoperative nausea and vomiting)   . Tachycardia    generally runs in the 120s   Assessment: 54 year old female on heparin for bilateral pulmonary emboli with evidence of heart strain.  IR consulted -heparin level= 0.12 after re-bolus and infusion increase  Goal of Therapy:  Heparin level 0.3-0.7 units/ml Monitor platelets by anticoagulation protocol: Yes   Plan:  -Heparin 3000 units iv bolus x 1 -Increase infusion to 2300 units/hr -Heparin level in 6 hours and daily wth CBC  daily -Consider change to lovenox if no procedures planned  Hildred Laser, PharmD Clinical Pharmacist **Pharmacist phone directory can now be found on [email or web address] ([email or web address]).  Listed under [place].

## 2019-02-22 ENCOUNTER — Encounter (HOSPITAL_COMMUNITY): Payer: Self-pay | Admitting: Internal Medicine

## 2019-02-22 ENCOUNTER — Inpatient Hospital Stay (HOSPITAL_COMMUNITY): Payer: BC Managed Care – PPO

## 2019-02-22 DIAGNOSIS — I824Y3 Acute embolism and thrombosis of unspecified deep veins of proximal lower extremity, bilateral: Secondary | ICD-10-CM

## 2019-02-22 LAB — CBC WITH DIFFERENTIAL/PLATELET
Abs Immature Granulocytes: 0.36 10*3/uL — ABNORMAL HIGH (ref 0.00–0.07)
Basophils Absolute: 0.1 10*3/uL (ref 0.0–0.1)
Basophils Relative: 1 %
Eosinophils Absolute: 0.2 10*3/uL (ref 0.0–0.5)
Eosinophils Relative: 2 %
HCT: 35.4 % — ABNORMAL LOW (ref 36.0–46.0)
Hemoglobin: 10.7 g/dL — ABNORMAL LOW (ref 12.0–15.0)
Immature Granulocytes: 3 %
Lymphocytes Relative: 24 %
Lymphs Abs: 3.2 10*3/uL (ref 0.7–4.0)
MCH: 27.3 pg (ref 26.0–34.0)
MCHC: 30.2 g/dL (ref 30.0–36.0)
MCV: 90.3 fL (ref 80.0–100.0)
Monocytes Absolute: 1.2 10*3/uL — ABNORMAL HIGH (ref 0.1–1.0)
Monocytes Relative: 9 %
Neutro Abs: 8.3 10*3/uL — ABNORMAL HIGH (ref 1.7–7.7)
Neutrophils Relative %: 61 %
Platelets: 379 10*3/uL (ref 150–400)
RBC: 3.92 MIL/uL (ref 3.87–5.11)
RDW: 15.9 % — ABNORMAL HIGH (ref 11.5–15.5)
WBC: 13.3 10*3/uL — ABNORMAL HIGH (ref 4.0–10.5)
nRBC: 0 % (ref 0.0–0.2)

## 2019-02-22 LAB — COMPREHENSIVE METABOLIC PANEL
ALT: 25 U/L (ref 0–44)
AST: 28 U/L (ref 15–41)
Albumin: 2.2 g/dL — ABNORMAL LOW (ref 3.5–5.0)
Alkaline Phosphatase: 69 U/L (ref 38–126)
Anion gap: 12 (ref 5–15)
BUN: 7 mg/dL (ref 6–20)
CO2: 21 mmol/L — ABNORMAL LOW (ref 22–32)
Calcium: 8.1 mg/dL — ABNORMAL LOW (ref 8.9–10.3)
Chloride: 107 mmol/L (ref 98–111)
Creatinine, Ser: 0.9 mg/dL (ref 0.44–1.00)
GFR calc Af Amer: >60 mL/min (ref >60)
GFR calc non Af Amer: >60 mL/min (ref >60)
Glucose, Bld: 100 mg/dL — ABNORMAL HIGH (ref 70–99)
Potassium: 3.3 mmol/L — ABNORMAL LOW (ref 3.5–5.1)
Sodium: 140 mmol/L (ref 135–145)
Total Bilirubin: 0.3 mg/dL (ref 0.3–1.2)
Total Protein: 6.4 g/dL — ABNORMAL LOW (ref 6.5–8.1)

## 2019-02-22 LAB — GLUCOSE, CAPILLARY
Glucose-Capillary: 113 mg/dL — ABNORMAL HIGH (ref 70–99)
Glucose-Capillary: 160 mg/dL — ABNORMAL HIGH (ref 70–99)
Glucose-Capillary: 105 mg/dL — ABNORMAL HIGH (ref 70–99)
Glucose-Capillary: 122 mg/dL — ABNORMAL HIGH (ref 70–99)

## 2019-02-22 LAB — HEPARIN LEVEL (UNFRACTIONATED)
Heparin Unfractionated: 0.48 IU/mL (ref 0.30–0.70)
Heparin Unfractionated: 0.66 IU/mL (ref 0.30–0.70)

## 2019-02-22 LAB — MAGNESIUM: Magnesium: 1.8 mg/dL (ref 1.7–2.4)

## 2019-02-22 LAB — PREALBUMIN: Prealbumin: 14.1 mg/dL — ABNORMAL LOW (ref 18–38)

## 2019-02-22 LAB — PHOSPHORUS: Phosphorus: 3.4 mg/dL (ref 2.5–4.6)

## 2019-02-22 MED ORDER — POTASSIUM CHLORIDE CRYS ER 20 MEQ PO TBCR
40.0000 meq | EXTENDED_RELEASE_TABLET | Freq: Once | ORAL | Status: DC
  Filled 2019-02-22: qty 2

## 2019-02-22 MED ORDER — ENOXAPARIN SODIUM 120 MG/0.8ML ~~LOC~~ SOLN
110.0000 mg | Freq: Two times a day (BID) | SUBCUTANEOUS | Status: DC
  Administered 2019-02-22 – 2019-02-25 (×6): 110 mg via SUBCUTANEOUS
  Filled 2019-02-22 (×7): qty 0.8

## 2019-02-22 MED ORDER — PANCRELIPASE (LIP-PROT-AMYL) 36000-114000 UNITS PO CPEP: ORAL_CAPSULE | ORAL | 11 refills | Status: DC

## 2019-02-22 MED ORDER — PANCRELIPASE (LIP-PROT-AMYL) 12000-38000 UNITS PO CPEP
36000.0000 [IU] | ORAL_CAPSULE | ORAL | Status: DC
  Administered 2019-02-22 – 2019-02-23 (×2): 36000 [IU] via ORAL
  Filled 2019-02-22: qty 3

## 2019-02-22 MED ORDER — POTASSIUM CHLORIDE 20 MEQ PO PACK
40.0000 meq | PACK | Freq: Once | ORAL | Status: AC
  Administered 2019-02-22: 40 meq via ORAL
  Filled 2019-02-22: qty 2

## 2019-02-22 MED ORDER — PANCRELIPASE (LIP-PROT-AMYL) 12000-38000 UNITS PO CPEP
72000.0000 [IU] | ORAL_CAPSULE | Freq: Three times a day (TID) | ORAL | Status: DC
  Administered 2019-02-22 – 2019-02-24 (×5): 72000 [IU] via ORAL
  Filled 2019-02-22 (×8): qty 6

## 2019-02-22 MED ORDER — GLUCERNA SHAKE PO LIQD
237.0000 mL | Freq: Three times a day (TID) | ORAL | Status: DC
  Administered 2019-02-22 – 2019-02-24 (×5): 237 mL via ORAL

## 2019-02-22 MED ORDER — PRO-STAT SUGAR FREE PO LIQD
30.0000 mL | Freq: Two times a day (BID) | ORAL | Status: DC
  Administered 2019-02-22 – 2019-02-23 (×2): 30 mL via ORAL
  Filled 2019-02-22 (×5): qty 30

## 2019-02-22 NOTE — TOC Benefit Eligibility Note (Signed)
Transition of Care [facility]) Benefit Eligibility Note    Patient Details  Name: Leah Farmer MRN: [id number] Date of Birth: 1965-09-23   Medication/Dose: Alveda Reasons 20 MG DAILY  Covered?: Yes  Tier: (NO TIER)  Prescription Coverage Preferred Pharmacy: Roseanne Kaufman with Person/Company/Phone Number:: [facility] RX # [phone]  Co-Pay: $40.00  Prior Approval: No  Deductible: (OUT-OF-POCKET-NOT MET)  Additional Notes: XARELTO 15 MG : NOT COVER, P/A # [id number]  .   ELIQUIS  2.5 MG BID  and  ELIQUIS 5 MG , COVER- YES, CO-PAY- $ 40.00  FOR EACH PRESCRIPTION,  TIER- NO , P/A NO    Memory Argue Phone Number: 02/22/2019, 5:15 PM

## 2019-02-22 NOTE — Progress Notes (Addendum)
PROGRESS NOTE    Leah Farmer  [id number] DOB: [birth year] DOA: 02/20/2019 PCP: Margy Clarks, NP  Brief Narrative:  HPI per Dr. Tennis Must on 02/20/2019 Leah Farmer is a 54 y.o. female with medical history significant of anxiety, depression, osteoarthritis of the lower back, type 2 diabetes, GERD, history of urolithiasis, history of NASH, history of tachycardia who last month on January 15, 2019 underwent Whipple procedure with distal pancreatic resection due to melena and neuroendocrine tumor of the pancreas.  Postop.  Was complicated by HCAP.  She was discharged on 1/05 2020, but was readmitted overnight for reinsertion no drains from 1/19 to 02/19/2018.  She was discharged home earlier today and now is coming into the emergency department due to sudden onset dyspnea.  Her BMI is 40.83 kg/m.  She is on birth control.  She is postop.  She denies fever, chills, sore throat, rhinorrhea, hemoptysis, productive cough, chest pain, dizziness, diaphoresis, PND orthopnea.  No dysuria, frequency or hematuria.  No polyuria, polydipsia, polyphagia or blurred vision.  ED Course: Initial vital signs temperature 97.9 F, pulse 137, blood pressure 148/89 mmHg, respirations 15 and O2 sat 86% on room air.  Current O2 sat is in the mid 90s on 2 LPM via [place].  She was started on a heparin infusion.  Her white count 16.3, hemoglobin 12.2 g/dL and platelets 422.  CMP shows a CO2 of 20 mmol/L.  The rest of her electrolytes are within normal range when calcium is corrected to an albumin of 2.8 g/dL.  The rest of the hepatic functions and renal functions are within normal range.  Glucose 169 mg/dL.   Assessment & Plan:   Principal Problem:   Bilateral pulmonary embolism (HCC) Active Problems:   Diabetes mellitus type 2 in obese [facility])   Essential hypertension   Hypomagnesemia  Acute Respiratory Failure with Hypoxia Bilateral Pulmonary Embolism with associated Bilateral DVT's -Admit to progressive  unit/inpatient. -CTA showed Extensive Bilateral Pulmonary Emboli with CT evidence for Right Heart Strain and there was a Mosaic Appearance of the Lung Parenchyma bilaterally likely 2/2 to the presence of extensive bilateral Pulmonary Emboli -LE Duplex showed "Findings consistent with acute deep vein thrombosis involving the right popliteal vein, and right peroneal veins. No cystic structure found in the popliteal fossa. Left: Findings consistent with acute deep vein thrombosis involving the left distal femoral vein, left popliteal vein, left posterior tibial veins, and left peroneal veins. No cystic structure found in the popliteal fossa." -PESI Class of IV  -SARS-CoV-2 NEGATIVE  -Continue supplemental oxygen.   -Currently on heparin infusion. -Trend troponin level and went from 417 -> 293 -Consult IR due to right heart strain and PCCM for EKOS and Lytics Evaluation -Pulmonary elevated and discussed the risks and benefits of catheter directed lytics for submassive PE and patient prefers to take a conservative approach currently with just anticoagulation; if she develops signs of deterioration she could still be considered for EKOS -Will need at least 3 to 6 months of anticoagulation and likely be changed to a NOAC in the next few days -Continue supplemental oxygen via nasal cannula and wean O2 as tolerated with a goal of SPO2 greater than 90% -Continue to monitor patient's clinical response to intervention -TOC determining cost of NOAC at the time of discharge -Discussed with pulmonology and will transition the patient to therapeutic lovenox for now, until she is clinically stable and can be changed to NOAC.   Pleural Effusion  -Had recurrent Thoracentesis last admission -CTA showed "Moderate  Left-Sided Pleural Effusion" -Consulted IR for Thoracentesis and possible EKOS but pulmonary recommends deferring thoracentesis until she is safe to hold anticoagulation since there is no compelling  evidence of infection toward the bleeding risk with oral and anticoagulation  Diabetes Mellitus, Type 2, uncontrolled with hyperglycemia, improved  -Patient's A1C is 7.5% from 12/11. On Janumet XR as an outpatient.  -Had worsened in setting of pancreatectomy and TPN.  -C/w Lantus 25 units sq BID and with Sensitive Novolog SSI AC -CBG's ranging from 105-113  Essential Hypertension -Continue Metoprolol Tartrate 25 mg pO BID,  -Currently holding Lisinopril 20 mg po Daily and HCTZ 12.5 mg po Daily  -Continue to Monitor BP's per Protocol   Sinus Tachycardia -Persistent. Patient is on Metoprolol 25 mg po BID but has an elevated HR in the setting of PE -Troponin I were initially elevated and trending down and went from 417 -> 293 -Echo shows normal EF.  Primary Malignant Neuroendocrine Pancreatic Tumor -Patient is s/p pancreaticoduodenectomy and hepaticojejunostomy done by Dr. Marcello Moores.  -General surgery following -Has 3 drains in place -CTA showed "Percutaneous drain in the left upper quadrant with significant interval improvement in the previously demonstrated collection. A small residual collection remains." -Continue to Monitor   Normocytic Anemia  -? Dilutional Drop from IVF -No signs of bleeding -Continue to follow hemoglobin.  Leukocytosis -In setting of Bilateral PE and DVT's.  -Blood cultures with no growth to date at 2 days -On Augmentin from prior Whipple Surgery  -WBC went from 16.3 -> 12.2 -Continue to Monitor and Trend -Repeat CMP in Am   Hypomagnesemia, improved -Mag Level was 1.6 and improved to 1.8 -Continue to Monitor and Replete as Necessary   Anxiety -Patient states she has panic attacks -Continue Clonazepam 0.25-0.5 mg po Dailyprn Anxiety  Right Foot Drop -Patient has a brace -Will obtain PT/OT Evaluate to Treat   Morbid Obesity -Estimated body mass index is 40.68 kg/m as calculated from the following:   Height as of this encounter: 5\' 4"   (1.626 m).   Weight as of this encounter: 107.5 kg. -Weight Loss and Dietary Counseling given   DVT prophylaxis: Anticoagulated with therapeutic Lovenox. Code Status: FULL CODE  Family Communication: Discussed with husband at the bedside Disposition Plan: Return home once heart rate and respiratory status is stabilized.  Consultants:   Interventional Radiology   General surgery  Pulmonology   Procedures:  LE VENOUS DUPLEX Findings consistent with acute deep vein thrombosis involving the right popliteal vein, and right peroneal veins. No cystic structure found in the popliteal fossa. Left: Findings consistent with acute deep vein thrombosis involving the left distal femoral vein, left popliteal vein, left posterior tibial veins, and left peroneal veins. No cystic structure found in the popliteal fossa.  Antimicrobials:  Anti-infectives (From admission, onward)   Start     Dose/Rate Route Frequency Ordered Stop   02/21/19 1000  amoxicillin-clavulanate (AUGMENTIN) 875-125 MG per tablet 1 tablet    Note to Pharmacy: 14 day starting on 02/19/2019     1 tablet Oral 2 times daily 02/21/19 0551       Subjective: She is starting to feel better.  She has not gotten up out of bed yet.  At rest, she does not feel short of breath.  No significant chest pain.  Objective: Vitals:   02/22/19 0107 02/22/19 0427 02/22/19 1253 02/22/19 2118  BP: 132/84 117/89 114/74 140/85  Pulse: (!) 102 100 98 (!) 108  Resp: 16 18 15 20   Temp: 98.4  F (36.9 C) 99.1 F (37.3 C) (!) 97.4 F (36.3 C) 98.4 F (36.9 C)  TempSrc: Oral Oral Oral Oral  SpO2: 96% 98% 97% 93%  Weight:  107.5 kg    Height:        Intake/Output Summary (Last 24 hours) at 02/22/2019 2121 Last data filed at 02/22/2019 1245 Gross per 24 hour  Intake 605 ml  Output 75 ml  Net 530 ml   Filed Weights   02/20/19 1647 02/21/19 0033 02/22/19 0427  Weight: 105.2 kg 107.9 kg 107.5 kg   Examination: General exam: Alert, awake,  oriented x 3 Respiratory system: Clear to auscultation. Respiratory effort normal. Cardiovascular system: Tachycardic. No murmurs, rubs, gallops. Gastrointestinal system: Abdomen is obese, soft and nontender. No organomegaly or masses felt. Normal bowel sounds heard.  Drains with white appearing output Central nervous system: Alert and oriented. No focal neurological deficits. Extremities: No C/C/E, +pedal pulses Skin: No rashes, lesions or ulcers Psychiatry: Judgement and insight appear normal. Mood & affect appropriate.    Data Reviewed: I have personally reviewed following labs and imaging studies  CBC: Recent Labs  Lab 02/17/19 0344 02/18/19 0300 02/20/19 1815 02/21/19 0411 02/22/19 0017  WBC 8.7 10.5 16.3* 12.2* 13.3*  NEUTROABS 5.4 6.8 13.4*  --  8.3*  HGB 9.9* 9.6* 12.2 11.4* 10.7*  HCT 31.8* 32.4* 40.7 38.4 35.4*  MCV 89.3 90.8 91.1 91.2 90.3  PLT 345 358 422* 311 [id number]   Basic Metabolic Panel: Recent Labs  Lab 02/16/19 0436 02/16/19 0436 02/17/19 0344 02/18/19 0300 02/20/19 1815 02/20/19 2031 02/21/19 0411 02/22/19 0017  NA 141   < > 138 140 140  --  141 140  K 2.8*   < > 3.4* 3.5 3.6  --  3.6 3.3*  CL 105   < > 107 109 106  --  109 107  CO2 25   < > 23 22 20*  --  19* 21*  GLUCOSE 142*   < > 153* 113* 169*  --  159* 100*  BUN 12   < > 10 9 9   --  7 7  CREATININE 0.89   < > 0.86 0.87 0.84  --  0.88 0.90  CALCIUM 7.9*   < > 7.7* 7.8* 8.7*  --  8.3* 8.1*  MG 1.4*  --   --   --   --  1.6* 2.0 1.8  PHOS 2.5  --   --   --   --  3.7 3.8 3.4   < > = values in this interval not displayed.   GFR: Estimated Creatinine Clearance: 87.5 mL/min (by C-G formula based on SCr of 0.9 mg/dL). Liver Function Tests: Recent Labs  Lab 02/20/19 1815 02/21/19 0411 02/22/19 0017  AST 36 30 28  ALT 32 27 25  ALKPHOS 90 67 69  BILITOT 0.7 0.6 0.3  PROT 7.6 6.2* 6.4*  ALBUMIN 2.8* 2.1* 2.2*   Recent Labs  Lab 02/20/19 1815  LIPASE 26   No results for input(s): AMMONIA  in the last 168 hours. Coagulation Profile: No results for input(s): INR, PROTIME in the last 168 hours. Cardiac Enzymes: No results for input(s): CKTOTAL, CKMB, CKMBINDEX, TROPONINI in the last 168 hours. BNP (last 3 results) No results for input(s): PROBNP in the last 8760 hours. HbA1C: No results for input(s): HGBA1C in the last 72 hours. CBG: Recent Labs  Lab 02/21/19 1629 02/21/19 2129 02/22/19 0622 02/22/19 1250 02/22/19 1619  GLUCAP 121* 107* 113* 160* 105*  Lipid Profile: No results for input(s): CHOL, HDL, LDLCALC, TRIG, CHOLHDL, LDLDIRECT in the last 72 hours. Thyroid Function Tests: No results for input(s): TSH, T4TOTAL, FREET4, T3FREE, THYROIDAB in the last 72 hours. Anemia Panel: No results for input(s): VITAMINB12, FOLATE, FERRITIN, TIBC, IRON, RETICCTPCT in the last 72 hours. Sepsis Labs: Recent Labs  Lab 02/20/19 2030  LATICACIDVEN 1.6    Recent Results (from the past 240 hour(s))  SARS CORONAVIRUS 2 (TAT 6-24 HRS) Nasopharyngeal Nasopharyngeal Swab     Status: None   Collection Time: 02/15/19  6:34 PM   Specimen: Nasopharyngeal Swab  Result Value Ref Range Status   SARS Coronavirus 2 NEGATIVE NEGATIVE Final    Comment: (NOTE) SARS-CoV-2 target nucleic acids are NOT DETECTED. The SARS-CoV-2 RNA is generally detectable in upper and lower respiratory specimens during the acute phase of infection. Negative results do not preclude SARS-CoV-2 infection, do not rule out co-infections with other pathogens, and should not be used as the sole basis for treatment or other patient management decisions. Negative results must be combined with clinical observations, patient history, and epidemiological information. The expected result is Negative. Fact Sheet for Patients: SugarRoll.be Fact Sheet for Healthcare Providers: https://www.woods-mathews.com/ This test is not yet approved or cleared by the [place] FDA and    has been authorized for detection and/or diagnosis of SARS-CoV-2 by FDA under an Emergency Use Authorization (EUA). This EUA will remain  in effect (meaning this test can be used) for the duration of the COVID-19 declaration under Section 56 4(b)(1) of the Act, 21 U.S.C. section 360bbb-3(b)(1), unless the authorization is terminated or revoked sooner. Performed at [facility], [place] [address] [place], [place] [address]   Culture, blood (Routine X 2) w Reflex to ID Panel     Status: None (Preliminary result)   Collection Time: 02/20/19  8:31 PM   Specimen: BLOOD  Result Value Ref Range Status   Specimen Description BLOOD LEFT ANTECUBITAL  Final   Special Requests   Final    BOTTLES DRAWN AEROBIC AND ANAEROBIC Blood Culture adequate volume   Culture   Final    NO GROWTH 2 DAYS Performed at [facility], [address] [place], [place] [address]    Report Status PENDING  Incomplete  Culture, blood (Routine X 2) w Reflex to ID Panel     Status: None (Preliminary result)   Collection Time: 02/20/19  8:31 PM   Specimen: BLOOD RIGHT HAND  Result Value Ref Range Status   Specimen Description BLOOD RIGHT HAND  Final   Special Requests   Final    BOTTLES DRAWN AEROBIC AND ANAEROBIC Blood Culture adequate volume   Culture   Final    NO GROWTH 2 DAYS Performed at [facility], [address], [place], [place] [address]    Report Status PENDING  Incomplete  SARS CORONAVIRUS 2 (TAT 6-24 HRS) Nasopharyngeal Nasopharyngeal Swab     Status: None   Collection Time: 02/20/19  8:33 PM   Specimen: Nasopharyngeal Swab  Result Value Ref Range Status   SARS Coronavirus 2 NEGATIVE NEGATIVE Final    Comment: (NOTE) SARS-CoV-2 target nucleic acids are NOT DETECTED. The SARS-CoV-2 RNA is generally detectable in upper and lower respiratory specimens during the acute phase of infection. Negative results do not preclude SARS-CoV-2 infection, do not rule out co-infections with other pathogens, and  should not be used as the sole basis for treatment or other patient management decisions. Negative results must be combined with clinical  observations, patient history, and epidemiological information. The expected result is Negative. Fact Sheet for Patients: SugarRoll.be Fact Sheet for Healthcare Providers: https://www.woods-mathews.com/ This test is not yet approved or cleared by the [place] FDA and  has been authorized for detection and/or diagnosis of SARS-CoV-2 by FDA under an Emergency Use Authorization (EUA). This EUA will remain  in effect (meaning this test can be used) for the duration of the COVID-19 declaration under Section 56 4(b)(1) of the Act, 21 U.S.C. section 360bbb-3(b)(1), unless the authorization is terminated or revoked sooner. Performed at [facility], [place] [address] [place], [place] [address]    Interventions: Glucerna shake, Prostat Radiology Studies: DG CHEST PORT 1 VIEW  Result Date: 02/22/2019 CLINICAL DATA:  Shortness of breath.  Abdominal drainage catheter. EXAM: PORTABLE CHEST 1 VIEW COMPARISON:  CT 02/20/2019.  Chest x-ray 02/16/2019. FINDINGS: Heart size stable. Persistent left base atelectasis and infiltrate. Persistent small left pleural effusion. No pneumothorax. Stable position left upper quadrant drainage catheter. IMPRESSION: 1. Persistent left base atelectasis and infiltrate. Persistent small left pleural effusion. No interim change. 2.  Stable position of left upper quadrant drainage catheter. Electronically Signed   By: Marcello Moores  Register   On: 02/22/2019 08:13   ECHOCARDIOGRAM COMPLETE  Result Date: 02/21/2019   ECHOCARDIOGRAM REPORT   Patient Name:   CRESSIE Pennix Date of Exam: 02/21/2019 Medical Rec #:  [id number]         Height:       64.0 in Accession #:    [id number]        Weight:       237.9 lb Date of Birth:  1965-10-31        BSA:          2.11 m Patient Age:    89 years          BP:            135/92 mmHg Patient Gender: F                 HR:           114 bpm. Exam Location:  Inpatient Procedure: 2D Echo, Color Doppler, Cardiac Doppler and Intracardiac            Opacification Agent Indications:     I26.02 Pulmonary embolus  History:         Patient has no prior history of Echocardiogram examinations.                  Risk Factors:Hypertension, Diabetes and Sleep Apnea. COVID+ on                  07/08/18.  Sonographer:     Raquel Sarna Senior RDCS Referring Phys:  [id number] [place] Diagnosing Phys: Loralie Champagne MD  Sonographer Comments: Technically difficult study due to poor echo windows and patient is morbidly obese. IMPRESSIONS  1. Left ventricular ejection fraction, by visual estimation, is 60 to 65%. The left ventricle has normal function. There is no left ventricular hypertrophy.  2. Definity contrast agent was given IV to delineate the left ventricular endocardial borders.  3. Left ventricular diastolic parameters are consistent with Grade I diastolic dysfunction (impaired relaxation).  4. The left ventricle has no regional wall motion abnormalities.  5. Poorly visualized RV. From the parasternal long axis, the RV appears dilated and hypokinetic though only a small portion of the RV can be seen. The interventricular septum was D-shaped, suggesting RV pressure/volume overload.  6. Left atrial size was normal.  7. Right atrial size was not well visualized.  8. The mitral valve is normal in structure. No evidence of mitral valve regurgitation. No evidence of mitral stenosis.  9. The tricuspid valve is not well visualized. Tricuspid valve regurgitation is not demonstrated. 10. The aortic valve is tricuspid. Aortic valve regurgitation is not visualized. No evidence of aortic valve sclerosis or stenosis. 11. The inferior vena cava is dilated in size with <50% respiratory variability, suggesting right atrial pressure of 15 mmHg. 12. TR signal is inadequate for assessing pulmonary artery  systolic pressure. 13. The interatrial septum was not well visualized. FINDINGS  Left Ventricle: Left ventricular ejection fraction, by visual estimation, is 60 to 65%. The left ventricle has normal function. Definity contrast agent was given IV to delineate the left ventricular endocardial borders. The left ventricle has no regional wall motion abnormalities. The left ventricular internal cavity size was the left ventricle is normal in size. There is no left ventricular hypertrophy. Left ventricular diastolic parameters are consistent with Grade I diastolic dysfunction (impaired relaxation). Right Ventricle: The right ventricular size is not well visualized. Right vetricular wall thickness was not assessed. Global RV systolic function is was not well visualized. Left Atrium: Left atrial size was normal in size. Right Atrium: Right atrial size was not well visualized Pericardium: There is no evidence of pericardial effusion. Mitral Valve: The mitral valve is normal in structure. No evidence of mitral valve regurgitation. No evidence of mitral valve stenosis by observation. Tricuspid Valve: The tricuspid valve is not well visualized. Tricuspid valve regurgitation is not demonstrated. Aortic Valve: The aortic valve is tricuspid. Aortic valve regurgitation is not visualized. The aortic valve is structurally normal, with no evidence of sclerosis or stenosis. Pulmonic Valve: The pulmonic valve was not well visualized. Pulmonic valve regurgitation is not visualized. Pulmonic regurgitation is not visualized. Aorta: The aortic root is normal in size and structure. Venous: The inferior vena cava is dilated in size with less than 50% respiratory variability, suggesting right atrial pressure of 15 mmHg. IAS/Shunts: The interatrial septum was not well visualized.  LEFT VENTRICLE PLAX 2D LVIDd:         4.20 cm  Diastology LVIDs:         2.90 cm  LV e' lateral:   7.51 cm/s LV PW:         1.10 cm  LV E/e' lateral: 8.7 LV IVS:         1.00 cm  LV e' medial:    6.85 cm/s LVOT diam:     2.10 cm  LV E/e' medial:  9.5 LV SV:         46 ml LV SV Index:   20.48 LVOT Area:     3.46 cm  RIGHT VENTRICLE RV S prime:     9.03 cm/s TAPSE (M-mode): 1.3 cm LEFT ATRIUM             Index LA diam:        2.70 cm 1.28 cm/m LA Vol (A2C):   41.9 ml 19.90 ml/m LA Vol (A4C):   24.6 ml 11.68 ml/m LA Biplane Vol: 34.7 ml 16.48 ml/m  AORTIC VALVE LVOT Vmax:   73.20 cm/s LVOT Vmean:  50.300 cm/s LVOT VTI:    0.113 m  AORTA Ao Root diam: 3.20 cm Ao Asc diam:  3.30 cm MITRAL VALVE MV Area (PHT): 3.68 cm             SHUNTS  MV PHT:        59.74 msec           Systemic VTI:  0.11 m MV Decel Time: 206 msec             Systemic Diam: 2.10 cm MV E velocity: 65.00 cm/s 103 cm/s MV A velocity: 88.30 cm/s 70.3 cm/s MV E/A ratio:  0.74       1.5  Loralie Champagne MD Electronically signed by Loralie Champagne MD Signature Date/Time: 02/21/2019/5:17:45 PM    Final (Updated)    VAS [place] LOWER EXTREMITY VENOUS (DVT)  Result Date: 02/21/2019  Lower Venous Study Indications: Pain, Swelling, and Palpable Cord.  Risk Factors: Confirmed PE. Anticoagulation: Heparin. Comparison Study: No prior study. Performing Technologist: Baldwin Crown RDMS, RVT  Examination Guidelines: A complete evaluation includes B-mode imaging, spectral Doppler, color Doppler, and power Doppler as needed of all accessible portions of each vessel. Bilateral testing is considered an integral part of a complete examination. Limited examinations for reoccurring indications may be performed as noted.  +---------+---------------+---------+-----------+----------+-------------------+ RIGHT    CompressibilityPhasicitySpontaneityPropertiesThrombus Aging      +---------+---------------+---------+-----------+----------+-------------------+ CFV      Full           Yes      Yes                                      +---------+---------------+---------+-----------+----------+-------------------+ SFJ      Full                                                              +---------+---------------+---------+-----------+----------+-------------------+ FV Prox  Full                                                             +---------+---------------+---------+-----------+----------+-------------------+ FV Mid   Full                                                             +---------+---------------+---------+-----------+----------+-------------------+ FV DistalFull                                                             +---------+---------------+---------+-----------+----------+-------------------+ POP      None           No       No                   Acute               +---------+---------------+---------+-----------+----------+-------------------+ PTV      Full  visualized with                                                           color flow          +---------+---------------+---------+-----------+----------+-------------------+ PERO     None                                                             +---------+---------------+---------+-----------+----------+-------------------+   +---------+---------------+---------+-----------+----------+--------------+ LEFT     CompressibilityPhasicitySpontaneityPropertiesThrombus Aging +---------+---------------+---------+-----------+----------+--------------+ CFV      Full           Yes      Yes                                 +---------+---------------+---------+-----------+----------+--------------+ SFJ      Full                                                        +---------+---------------+---------+-----------+----------+--------------+ FV Prox  Full                                                        +---------+---------------+---------+-----------+----------+--------------+ FV Mid   Full                                                         +---------+---------------+---------+-----------+----------+--------------+ FV DistalPartial                                                     +---------+---------------+---------+-----------+----------+--------------+ PFV      Full                                                        +---------+---------------+---------+-----------+----------+--------------+ POP      None           No       No                   Acute          +---------+---------------+---------+-----------+----------+--------------+ PTV      None                                                        +---------+---------------+---------+-----------+----------+--------------+  PERO     None                                                        +---------+---------------+---------+-----------+----------+--------------+ Poorly visualized bilateral calf veins.    Summary: Right: Findings consistent with acute deep vein thrombosis involving the right popliteal vein, and right peroneal veins. No cystic structure found in the popliteal fossa. Left: Findings consistent with acute deep vein thrombosis involving the left distal femoral vein, left popliteal vein, left posterior tibial veins, and left peroneal veins. No cystic structure found in the popliteal fossa.  *See table(s) above for measurements and observations. Electronically signed by Monica Martinez MD on 02/21/2019 at 5:02:51 PM.    Final    Scheduled Meds: . amoxicillin-clavulanate  1 tablet Oral BID  . enoxaparin (LOVENOX) injection  110 mg Subcutaneous Q12H  . feeding supplement (GLUCERNA SHAKE)  237 mL Oral TID BM  . feeding supplement (PRO-STAT SUGAR FREE 64)  30 mL Oral BID  . insulin aspart  0-15 Units Subcutaneous TID WC  . insulin glargine  25 Units Subcutaneous BID  . lipase/protease/amylase  36,000 Units Oral With snacks  . lipase/protease/amylase  72,000 Units Oral TID WC  . metoprolol succinate  50 mg Oral QHS  . pantoprazole   40 mg Oral Daily  . rosuvastatin  10 mg Oral QHS  . [START ON 02/23/2019] Vitamin D (Ergocalciferol)  50,000 Units Oral Q Sat   Continuous Infusions:   LOS: 2 days   Kathie Dike, MD [facility] PAGER is on AMION  If 7PM-7AM, please contact night-coverage [email or web address]

## 2019-02-22 NOTE — Progress Notes (Signed)
  ANTICOAGULATION CONSULT NOTE - Initial Consult  Pharmacy Consult for enoxaparin Indication: pulmonary embolus   Patient Measurements: Height: 5\' 4"  (162.6 cm) Weight: 236 lb 15.9 oz (107.5 kg) IBW/kg (Calculated) : 54.7  Vital Signs: Temp: 97.4 F (36.3 C) (01/22 1253) Temp Source: Oral (01/22 1253) BP: 114/74 (01/22 1253) Pulse Rate: 98 (01/22 1253)  Labs: Recent Labs    02/20/19 1815 02/20/19 1815 02/20/19 2031 02/21/19 0411 02/21/19 1131 02/21/19 2139 02/22/19 0017 02/22/19 0533 02/22/19 1040  HGB 12.2   < >  --  11.4*  --   --  10.7*  --   --   HCT 40.7  --   --  38.4  --   --  35.4*  --   --   PLT 422*  --   --  311  --   --  379  --   --   HEPARINUNFRC  --   --   --  0.13*   < > 1.01*  --  0.48 0.66  CREATININE 0.84  --   --  0.88  --   --  0.90  --   --   TROPONINIHS  --   --  417* 293*  --   --   --   --   --    < > = values in this interval not displayed.    Estimated Creatinine Clearance: 87.5 mL/min (by C-G formula based on SCr of 0.9 mg/dL).  Assessment: 54 yo W with acute submassive BL PE and R heart strain. Patient was on heparin infusion, now changing to enoxaparin. Team is still deciding which oral anticoagulant will be the most affordable for patient. Expected to only be on enoxaprain for a short term so will hold off on anti-Xa levels for now (BMI = 40). Can re-consider if on enoxaparin for a prolonged period.    Goal of Therapy:  Monitor platelets by anticoagulation protocol: Yes   Plan:  Enoxaparin 110mg  Q12 hr  Monitor for signs/symptoms of bleeding  F/u transition to oral anticoagulant Consider anti-xa level if not transitioned soon   Benetta Spar, PharmD, BCPS, [facility] Clinical Pharmacist  Please check AMION for all [place] phone numbers After 10:00 PM, call [place] [phone]

## 2019-02-22 NOTE — Progress Notes (Addendum)
ANTICOAGULATION CONSULT NOTE  Pharmacy Consult for Heparin Indication: pulmonary embolus 02/20/19  Patient Measurements: Height: 5\' 4"  (162.6 cm) Weight: 236 lb 15.9 oz (107.5 kg) IBW/kg (Calculated) : 54.7 Heparin Dosing Weight: 82 kg  Vital Signs: Temp: 99.1 F (37.3 C) (01/22 0427) Temp Source: Oral (01/22 0427) BP: 117/89 (01/22 0427) Pulse Rate: 100 (01/22 0427)  Labs: Recent Labs    02/20/19 1815 02/20/19 1815 02/20/19 2031 02/21/19 0411 02/21/19 0411 02/21/19 1131 02/21/19 2139 02/22/19 0017 02/22/19 0533  HGB 12.2   < >  --  11.4*  --   --   --  10.7*  --   HCT 40.7  --   --  38.4  --   --   --  35.4*  --   PLT 422*  --   --  311  --   --   --  379  --   HEPARINUNFRC  --   --   --  0.13*   < > 0.12* 1.01*  --  0.48  CREATININE 0.84  --   --  0.88  --   --   --  0.90  --   TROPONINIHS  --   --  417* 293*  --   --   --   --   --    < > = values in this interval not displayed.    Estimated Creatinine Clearance: 87.5 mL/min (by C-G formula based on SCr of 0.9 mg/dL).  Assessment: 54 year old female on heparin for acute bilateral pulmonary emboli with evidence of heart strain 02/20/19.    Heparin level therapeutic s/p rate decrease to 2000 units/hr, H/H low but stable, plts wnl, no bleeding reported.   Goal of Therapy:  Heparin level 0.3-0.7 units/ml Monitor platelets by anticoagulation protocol: Yes   Plan:  Continue heparin gtt at 2000 units/hr F/u 6 hour heparin level to confirm  Addendum: F/u heparin level therapeutic at 0.66, but uptrend Decrease heparin gtt to 1950 units/hr F/u heparin level with AM labs  Bertis Ruddy, PharmD Clinical Pharmacist Please check AMION for all [place] numbers 02/22/2019 8:14 AM

## 2019-02-22 NOTE — Progress Notes (Signed)
Initial Nutrition Assessment  RD working remotely.  DOCUMENTATION CODES:   Morbid obesity  INTERVENTION:   - Glucerna Shake po TID, each supplement provides 220 kcal and 10 grams of protein  - Pro-stat 30 ml po BID, each supplement provides 100 kcal and 15 grams of protein  - Encourage adequate PO intake  NUTRITION DIAGNOSIS:   Inadequate oral intake related to decreased appetite, nausea as evidenced by per patient/family report.  GOAL:   Patient will meet greater than or equal to 90% of their needs  MONITOR:   PO intake, Supplement acceptance, Labs, Weight trends  REASON FOR ASSESSMENT:   Malnutrition Screening Tool    ASSESSMENT:   54 year old female who presented to the ED on 1/20 with SOB. PMH of anxiety, depression, osteoarthritis of the lower back, T2DM, GERD, NASH. Last month on 01/15/19, pt underwent Whipple procedure with distal pancreatic resection due to melena and neuroendocrine tumor of the pancreas. Pt found to have bilateral DVTs and bilateral pulmonary emboli.   Pt currently on a soft diet. Meal completion 25% at breakfast this AM.  Spoke with pt via phone call to room. Pt reports that her appetite is "not great" and is worse when she was experiencing nausea. Pt reports having nausea frequently PTA but states she has not had any since admission. Pt believes that her appetite is picking up slowly.  Pt states that she feels afraid at times to eat due to her surgery. Pt does not want to eat too much. RD discussed the importance of smaller, more frequent meals throughout the day as well as focusing on protein-rich foods and eating those foods first.  Pt states that at lunch today, she completed 50% of her meal try that included chicken, mashed potatoes, and collards. Pt reports that at home, she eats less than this because she is "nibbling" throughout the day on items like peanut butter crackers and cheese toast which seems to settle her stomach.  Pt reports  that she has been trying to drink some shakes at home and prefers Glucerna. Pt states that one day it will go well with the shakes and the next day it will not. Pt reports she does not drink a full shake in one sitting.  Pt endorses weight loss that began in December 2020 after her surgery. Pt reports her UBW is 250 lbs. Current weight is 237 lbs. Pt with a 7 kg weight loss since 01/11/19. This is a 6.1% weight loss which is significant for timeframe. Pt is at risk for malnutrition.  Of note, pt with non-pitting generalized edema which is likely masking additional weight loss.  Medications reviewed and include: SSI, Lantus 25 units BID, protonix, vitamin D, heparin  Labs reviewed: potassium 3.3 CBG's: 107-160 x 24 hours  NUTRITION - FOCUSED PHYSICAL EXAM:  Unable to complete at this time. RD working remotely.  Diet Order:   Diet Order            DIET SOFT Room service appropriate? Yes; Fluid consistency: Thin  Diet effective now              EDUCATION NEEDS:   Education needs have been addressed  Skin:  Skin Assessment: Skin Integrity Issues: Incisions: closed abdomen  Last BM:  02/21/19  Height:   Ht Readings from Last 1 Encounters:  02/21/19 5\' 4"  (1.626 m)    Weight:   Wt Readings from Last 1 Encounters:  02/22/19 107.5 kg    Ideal Body Weight:  54.5 kg  BMI:  Body mass index is 40.68 kg/m.  Estimated Nutritional Needs:   Kcal:  2100-2300  Protein:  110-130 grams  Fluid:  >/= 2.0 L    Gaynell Face, MS, RD, LDN Inpatient Clinical Dietitian Pager: [phone] Weekend/After Hours: [phone]

## 2019-02-22 NOTE — Progress Notes (Addendum)
Pt readmitted from [place] with bilateral PEs and right heart strain.    WBCs have remained sl elevated.  Continue augmentin.  Continue perc drains.  Flush left sided drain (orders are written for this).  Anticoagulation OK.    Will recheck prealbumin.  Diet as tolerated.    Adding creon for pancreatic exocrine insufficiency.   Megace was written for appetite stimulation, but can increase thrombotic risk, so will not restart.    Will follow up Monday if patient still in house.  Will make arrangements for outpatient follow up.

## 2019-02-22 NOTE — Progress Notes (Signed)
NAME:  Leah Farmer, MRN:  [id number], DOB:  08/27/1965, LOS: 2 ADMISSION DATE:  02/20/2019, CONSULTATION DATE:  02/21/2019 REFERRING MD:  Leah Farmer, CHIEF COMPLAINT:  Submassive PE  Brief History   Submassive PE Recent Whipple 01/15/19 for neuroendocrine tumor (Curative surgery) Recent hospital admission for fluid accumulation in RUQ abd  History of present illness   Leah Farmer is a 54 y/o woman admitted with acute hypoxic respiratory failure after presenting with acute onset SOB and tachycardia. She had been discharged home the day prior and felt fine. She slept well overnight but was SOB when she was walking back from the bathroom first thing in the morning yesterday. Her husband assumed she had a panic attack, but several hours after a dose of benzos and a nap she remained dyspneic. In the ED she was found to have submassive PEs and was started on heparin. 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 She had fluid accumulation in the LUQ of the abdomen with drain relocation during her recent admission at [facility]. She has 1 left sided and 2 right sided drains with nonbloody fluid.  Past Medical History  Neuroendocrine tumor NAFL Nephrolithiasis GERD DM  Significant Hospital Events     Consults:  PCCM IR  Procedures:    Significant Diagnostic Tests:  CTA- bilateral PEs with RV>LV diameter, LLL airspace disease and moderate left dependent effusion LE doppler US- b/l DVTs Echo- RV enlarged with reduced systolic function. Elevated RAP. Normal LV  Micro Data:  sars-cov2 negative  Antimicrobials:  Augmentin 1/20>>  Interim history/subjective:  Feeling less SOB today. No CP. Hasn't been OOB yet. Was working with PT prior to admission due to foot drop. No bleeding other than menstruation.  Objective   Blood pressure 117/89, pulse 100, temperature 99.1 F (37.3 C), temperature source Oral,  resp. rate 18, height 5\' 4"  (1.626 m), weight 107.5 kg, SpO2 98 %.        Intake/Output Summary (Last 24 hours) at 02/22/2019 [id number] Last data filed at 02/22/2019 [id number] Gross per 24 hour  Intake 250 ml  Output 75 ml  Net 175 ml   Filed Weights   02/20/19 1647 02/21/19 0033 02/22/19 0427  Weight: 105.2 kg 107.9 kg 107.5 kg    Examination: General: obese woman laying in bed in NAD, appears stated age HENT: [place]/AT, eyes anicteric Lungs: CTAB, no tachypnea, breathing comfortably on [place] Cardiovascular: less tachycardic, regular rhythm, no murmurs Abdomen: obese, soft, well healed abd scar, 3 drains with thin white drainage- no blood in drains Extremities: b/l LE edema Neuro: awake and alert, moving all extremities spontaneously Derm: no rashes or wounds  Resolved Hospital Problem list     Assessment & Plan:  Submassive PE- provoked with recent major surgery and malignancy with BLE DVTs. PESI   IV -Previously discussed risks vs benefits of catheter directed lytics for submassive PE with Leah Farmer and her husband, and they opted for [facility] rather than additional invasive procedures that could increase her bleeding risk in light of her whipple last month and drain replacement a few days ago. Doing well on AC. -Recommend switching to Eliquis, which she should be discharged on for 3-6 months. May wan to monitor for full resolution of LE DVTs prior to discontinuation of AC. -Recommend OP echo 1-3 months after to ensure resolution of right heart strain.  -Con't to monitor on tele. If she should develop signs of deterioration,  abdominal surgery >4 weeks ago is a relative C/I rather than a strict contraindication, so thrombolytics (IV or catheter directed) could be considered. -con't telemetry monitoring -recommend PT consult and cautious OOB mobility -can follow up with Pulmonology at [facility] in about 1 month (appt requested; our office will call her)  Acute hypoxic respiratory failure due to  PE -titrate O2 as required or spo2 >90%  Pleural effusion, likely transudative from recent surgery and L-sided abdominal fluid collection -will defer thoracentesis until she is safe to hold [facility] or there is compelling evidence of infection to warrant the bleeding risk of thora on [facility]  We will sign off. Please call with questions.  Labs   CBC: Recent Labs  Lab 02/15/19 1855 02/16/19 0436 02/17/19 0344 02/18/19 0300 02/20/19 1815 02/21/19 0411 02/22/19 0017  WBC 11.3*   < > 8.7 10.5 16.3* 12.2* 13.3*  NEUTROABS 8.1*  --  5.4 6.8 13.4*  --  8.3*  HGB 10.4*   < > 9.9* 9.6* 12.2 11.4* 10.7*  HCT 33.7*   < > 31.8* 32.4* 40.7 38.4 35.4*  MCV 87.1   < > 89.3 90.8 91.1 91.2 90.3  PLT 405*   < > 345 358 422* 311 379   < > = values in this interval not displayed.    Basic Metabolic Panel: Recent Labs  Lab 02/15/19 1123 02/15/19 1855 02/16/19 0436 02/16/19 0436 02/17/19 0344 02/18/19 0300 02/20/19 1815 02/20/19 2031 02/21/19 0411 02/22/19 0017  NA 141   < > 141   < > 138 140 140  --  141 140  K 3.0*   < > 2.8*   < > 3.4* 3.5 3.6  --  3.6 3.3*  CL 95*   < > 105   < > 107 109 106  --  109 107  CO2 28   < > 25   < > 23 22 20*  --  19* 21*  GLUCOSE 202*   < > 142*   < > 153* 113* 169*  --  159* 100*  BUN 18   < > 12   < > 10 9 9   --  7 7  CREATININE 1.01*   < > 0.89   < > 0.86 0.87 0.84  --  0.88 0.90  CALCIUM 9.1   < > 7.9*   < > 7.7* 7.8* 8.7*  --  8.3* 8.1*  MG 1.7  --  1.4*  --   --   --   --  1.6* 2.0 1.8  PHOS 2.6  --  2.5  --   --   --   --  3.7 3.8 3.4   < > = values in this interval not displayed.   GFR: Estimated Creatinine Clearance: 87.5 mL/min (by C-G formula based on SCr of 0.9 mg/dL). Recent Labs  Lab 02/18/19 0300 02/20/19 1815 02/20/19 2030 02/21/19 0411 02/22/19 0017  WBC 10.5 16.3*  --  12.2* 13.3*  LATICACIDVEN  --   --  1.6  --   --     Liver Function Tests: Recent Labs  Lab 02/15/19 1123 02/15/19 1855 02/20/19 1815 02/21/19 0411  02/22/19 0017  AST 15 13* 36 30 28  ALT 13 11 32 27 25  ALKPHOS 81 68 90 67 69  BILITOT 0.9 0.9 0.7 0.6 0.3  PROT 8.4* 7.1 7.6 6.2* 6.4*  ALBUMIN 2.9* 2.3* 2.8* 2.1* 2.2*   Recent Labs  Lab 02/15/19 1123 02/20/19 1815  LIPASE 24 26  No results for input(s): AMMONIA in the last 168 hours.  ABG    Component Value Date/Time   PHART 7.432 01/21/2019 0430   PCO2ART 31.5 (L) 01/21/2019 0430   PO2ART 164.0 (H) 01/21/2019 0430   HCO3 21.0 01/21/2019 0430   TCO2 22 01/21/2019 0430   ACIDBASEDEF 3.0 (H) 01/21/2019 0430   O2SAT 100.0 01/21/2019 0430     Coagulation Profile: Recent Labs  Lab 02/15/19 1855  INR 1.3*    Cardiac Enzymes: No results for input(s): CKTOTAL, CKMB, CKMBINDEX, TROPONINI in the last 168 hours.  HbA1C: Hgb A1c MFr Bld  Date/Time Value Ref Range Status  01/11/2019 11:10 AM 7.5 (H) 4.8 - 5.6 % Final    Comment:    (NOTE) Pre diabetes:          5.7%-6.4% Diabetes:              >6.4% Glycemic control for   <7.0% adults with diabetes     CBG: Recent Labs  Lab 02/21/19 0610 02/21/19 1145 02/21/19 1629 02/21/19 2129 02/22/19 0622  GLUCAP 138* 132* Benton Roosevelt, DO 02/22/19 9:49 AM [facility]

## 2019-02-23 LAB — BASIC METABOLIC PANEL
Anion gap: 10 (ref 5–15)
BUN: 9 mg/dL (ref 6–20)
CO2: 22 mmol/L (ref 22–32)
Calcium: 8.4 mg/dL — ABNORMAL LOW (ref 8.9–10.3)
Chloride: 109 mmol/L (ref 98–111)
Creatinine, Ser: 0.83 mg/dL (ref 0.44–1.00)
GFR calc Af Amer: >60 mL/min (ref >60)
GFR calc non Af Amer: >60 mL/min (ref >60)
Glucose, Bld: 93 mg/dL (ref 70–99)
Potassium: 3.2 mmol/L — ABNORMAL LOW (ref 3.5–5.1)
Sodium: 141 mmol/L (ref 135–145)

## 2019-02-23 LAB — CBC
HCT: 33.5 % — ABNORMAL LOW (ref 36.0–46.0)
Hemoglobin: 10.1 g/dL — ABNORMAL LOW (ref 12.0–15.0)
MCH: 27.4 pg (ref 26.0–34.0)
MCHC: 30.1 g/dL (ref 30.0–36.0)
MCV: 90.8 fL (ref 80.0–100.0)
Platelets: 328 10*3/uL (ref 150–400)
RBC: 3.69 MIL/uL — ABNORMAL LOW (ref 3.87–5.11)
RDW: 15.9 % — ABNORMAL HIGH (ref 11.5–15.5)
WBC: 10.1 10*3/uL (ref 4.0–10.5)
nRBC: 0 % (ref 0.0–0.2)

## 2019-02-23 LAB — GLUCOSE, CAPILLARY
Glucose-Capillary: 90 mg/dL (ref 70–99)
Glucose-Capillary: 121 mg/dL — ABNORMAL HIGH (ref 70–99)
Glucose-Capillary: 107 mg/dL — ABNORMAL HIGH (ref 70–99)
Glucose-Capillary: 109 mg/dL — ABNORMAL HIGH (ref 70–99)

## 2019-02-23 LAB — OCCULT BLOOD X 1 CARD TO LAB, STOOL: Fecal Occult Bld: POSITIVE — AB

## 2019-02-23 MED ORDER — POTASSIUM CHLORIDE CRYS ER 20 MEQ PO TBCR: 40.0000 meq | EXTENDED_RELEASE_TABLET | Freq: Once | ORAL | Status: DC

## 2019-02-23 MED ORDER — LOPERAMIDE HCL 2 MG PO CAPS
2.0000 mg | ORAL_CAPSULE | ORAL | Status: DC | PRN
  Administered 2019-02-23: 2 mg via ORAL
  Filled 2019-02-23: qty 1

## 2019-02-23 MED ORDER — POTASSIUM CHLORIDE 20 MEQ PO PACK
40.0000 meq | PACK | Freq: Once | ORAL | Status: AC
  Administered 2019-02-23: 40 meq via ORAL
  Filled 2019-02-23: qty 2

## 2019-02-23 NOTE — Progress Notes (Signed)
Patient refused evening dose of Creon 72,000 units and states "It makes me nauseous and is not helping my diarrhea." Medication education given to patient and spouse (currently at bedside). Nurse encouraged patient to take medication, patient continues to refuse. PRN dose of Imodium given at 17:59 and Zofran given at 17:09. Nurse encouraged patient to take next dose of Creon when due, after Zofran and Imodium has had time to work, patient agrees. 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

## 2019-02-23 NOTE — Progress Notes (Signed)
PROGRESS NOTE    Leah Farmer  [id number] DOB: Sep 24, 1965 DOA: 02/20/2019 PCP: Margy Clarks, NP    Brief Narrative:  HPI per Dr. Tennis Must on 02/20/2019 Leah Farmer a 54 y.o.femalewith medical history significant ofanxiety, depression, osteoarthritis of the lower back, type 2 diabetes, GERD, history of urolithiasis, history of NASH, history of tachycardia who last month on January 15, 2019 underwent Whipple procedure with distal pancreatic resection due to melena and neuroendocrine tumor of the pancreas. Postop. Was complicated byHCAP. She was discharged on 1/05 2020, but was readmitted overnight for reinsertion no drains from 1/19 to 02/19/2018. She was discharged home earlier today and now is coming into the emergency department due to sudden onset dyspnea. Her BMI is 40.83 kg/m. She is on birth control. She is postop. She denies fever, chills, sore throat, rhinorrhea, hemoptysis, productive cough, chest pain, dizziness, diaphoresis, PND orthopnea. No dysuria, frequency or hematuria. No polyuria, polydipsia, polyphagia or blurred vision.  ED Course:Initial vital signs temperature 97.9 F, pulse 137, blood pressure 148/89 mmHg, respirations 15 and O2 sat 86% on room air. Current O2 sat is in the mid 90s on 2 LPM via [place]. She was started on a heparin infusion.  Her white count 16.3, hemoglobin 12.2 g/dL and platelets 422. CMP shows a CO2 of 20 mmol/L. The rest of her electrolytes are within normal range when calcium is corrected to an albumin of 2.8 g/dL. The rest of the hepatic functions and renal functions are within normal range. Glucose 169 mg/dL.     Consultants:   Int radiology, general surgery, pulmonology  Procedures: LE venous duplex Findings consistent with acute deep vein thrombosis involving the right popliteal vein, and right peroneal veins. No cystic structure found in the popliteal fossa. Left: Findings consistent with acute deep vein  thrombosis involving the left distal femoral vein, left popliteal vein, left posterior tibial veins, and left peroneal veins. No cystic structure found in the popliteal fossa.     Subjective: Husband at bedside.  Patient complaining of weakness and trying to have the strength in gait stability to be able to get up.  Has not tried to ambulate.  Feels shortness of breath is a little better but not at baseline.  Still having some loose stools.  No chest pain.  Objective: Vitals:   02/22/19 2118 02/23/19 0031 02/23/19 0406 02/23/19 0821  BP: 140/85 132/84 (!) 141/82 135/84  Pulse: (!) 108 96 99 95  Resp: 20 18 20 14   Temp: 98.4 F (36.9 C) 98.1 F (36.7 C) 97.9 F (36.6 C)   TempSrc: Oral Oral Oral   SpO2: 93% 95% 92% 95%  Weight:   108.2 kg   Height:        Intake/Output Summary (Last 24 hours) at 02/23/2019 1240 Last data filed at 02/22/2019 1245 Gross per 24 hour  Intake 240 ml  Output --  Net 240 ml   Filed Weights   02/21/19 0033 02/22/19 0427 02/23/19 0406  Weight: 107.9 kg 107.5 kg 108.2 kg    Examination:  General exam: Appears calm and comfortable, NAD, husband at bedside Respiratory system: Clear to auscultation. Respiratory effort normal. Cardiovascular system: S1 & S2 heard, RRR. No JVD, murmurs, rubs, gallops or clicks.  Gastrointestinal system: Abdomen is nondistended, soft and nontender. No organomegaly or masses felt. Normal bowel sounds heard. Central nervous system: Alert and oriented. No focal neurological deficits. Extremities: No edema Skin: Warm dry Psychiatry: Judgement and insight appear normal. Mood & affect appropriate.  Data Reviewed: I have personally reviewed following labs and imaging studies  CBC: Recent Labs  Lab 02/17/19 0344 02/17/19 0344 02/18/19 0300 02/20/19 1815 02/21/19 0411 02/22/19 0017 02/23/19 0618  WBC 8.7   < > 10.5 16.3* 12.2* 13.3* 10.1  NEUTROABS 5.4  --  6.8 13.4*  --  8.3*  --   HGB 9.9*   < > 9.6* 12.2  11.4* 10.7* 10.1*  HCT 31.8*   < > 32.4* 40.7 38.4 35.4* 33.5*  MCV 89.3   < > 90.8 91.1 91.2 90.3 90.8  PLT 345   < > 358 422* 311 379 328   < > = values in this interval not displayed.   Basic Metabolic Panel: Recent Labs  Lab 02/18/19 0300 02/20/19 1815 02/20/19 2031 02/21/19 0411 02/22/19 0017 02/23/19 0618  NA 140 140  --  141 140 141  K 3.5 3.6  --  3.6 3.3* 3.2*  CL 109 106  --  109 107 109  CO2 22 20*  --  19* 21* 22  GLUCOSE 113* 169*  --  159* 100* 93  BUN 9 9  --  7 7 9   CREATININE 0.87 0.84  --  0.88 0.90 0.83  CALCIUM 7.8* 8.7*  --  8.3* 8.1* 8.4*  MG  --   --  1.6* 2.0 1.8  --   PHOS  --   --  3.7 3.8 3.4  --    GFR: Estimated Creatinine Clearance: 95.3 mL/min (by C-G formula based on SCr of 0.83 mg/dL). Liver Function Tests: Recent Labs  Lab 02/20/19 1815 02/21/19 0411 02/22/19 0017  AST 36 30 28  ALT 32 27 25  ALKPHOS 90 67 69  BILITOT 0.7 0.6 0.3  PROT 7.6 6.2* 6.4*  ALBUMIN 2.8* 2.1* 2.2*   Recent Labs  Lab 02/20/19 1815  LIPASE 26   No results for input(s): AMMONIA in the last 168 hours. Coagulation Profile: No results for input(s): INR, PROTIME in the last 168 hours. Cardiac Enzymes: No results for input(s): CKTOTAL, CKMB, CKMBINDEX, TROPONINI in the last 168 hours. BNP (last 3 results) No results for input(s): PROBNP in the last 8760 hours. HbA1C: No results for input(s): HGBA1C in the last 72 hours. CBG: Recent Labs  Lab 02/22/19 0622 02/22/19 1250 02/22/19 1619 02/22/19 2119 02/23/19 0612  GLUCAP 113* 160* 105* 122* 90   Lipid Profile: No results for input(s): CHOL, HDL, LDLCALC, TRIG, CHOLHDL, LDLDIRECT in the last 72 hours. Thyroid Function Tests: No results for input(s): TSH, T4TOTAL, FREET4, T3FREE, THYROIDAB in the last 72 hours. Anemia Panel: No results for input(s): VITAMINB12, FOLATE, FERRITIN, TIBC, IRON, RETICCTPCT in the last 72 hours. Sepsis Labs: Recent Labs  Lab 02/20/19 2030  LATICACIDVEN 1.6    Recent  Results (from the past 240 hour(s))  SARS CORONAVIRUS 2 (TAT 6-24 HRS) Nasopharyngeal Nasopharyngeal Swab     Status: None   Collection Time: 02/15/19  6:34 PM   Specimen: Nasopharyngeal Swab  Result Value Ref Range Status   SARS Coronavirus 2 NEGATIVE NEGATIVE Final    Comment: (NOTE) SARS-CoV-2 target nucleic acids are NOT DETECTED. The SARS-CoV-2 RNA is generally detectable in upper and lower respiratory specimens during the acute phase of infection. Negative results do not preclude SARS-CoV-2 infection, do not rule out co-infections with other pathogens, and should not be used as the sole basis for treatment or other patient management decisions. Negative results must be combined with clinical observations, patient history, and epidemiological information. The expected result  is Negative. Fact Sheet for Patients: SugarRoll.be Fact Sheet for Healthcare Providers: https://www.woods-mathews.com/ This test is not yet approved or cleared by the [place] FDA and  has been authorized for detection and/or diagnosis of SARS-CoV-2 by FDA under an Emergency Use Authorization (EUA). This EUA will remain  in effect (meaning this test can be used) for the duration of the COVID-19 declaration under Section 56 4(b)(1) of the Act, 21 U.S.C. section 360bbb-3(b)(1), unless the authorization is terminated or revoked sooner. Performed at [facility], [place] [address] [place], [place] [address]   Culture, blood (Routine X 2) w Reflex to ID Panel     Status: None (Preliminary result)   Collection Time: 02/20/19  8:31 PM   Specimen: BLOOD  Result Value Ref Range Status   Specimen Description BLOOD LEFT ANTECUBITAL  Final   Special Requests   Final    BOTTLES DRAWN AEROBIC AND ANAEROBIC Blood Culture adequate volume   Culture   Final    NO GROWTH 3 DAYS Performed at [facility], [address], [place], [place] [address]    Report Status PENDING   Incomplete  Culture, blood (Routine X 2) w Reflex to ID Panel     Status: None (Preliminary result)   Collection Time: 02/20/19  8:31 PM   Specimen: BLOOD RIGHT HAND  Result Value Ref Range Status   Specimen Description BLOOD RIGHT HAND  Final   Special Requests   Final    BOTTLES DRAWN AEROBIC AND ANAEROBIC Blood Culture adequate volume   Culture   Final    NO GROWTH 3 DAYS Performed at [facility], [address], [place], [place] [address]    Report Status PENDING  Incomplete  SARS CORONAVIRUS 2 (TAT 6-24 HRS) Nasopharyngeal Nasopharyngeal Swab     Status: None   Collection Time: 02/20/19  8:33 PM   Specimen: Nasopharyngeal Swab  Result Value Ref Range Status   SARS Coronavirus 2 NEGATIVE NEGATIVE Final    Comment: (NOTE) SARS-CoV-2 target nucleic acids are NOT DETECTED. The SARS-CoV-2 RNA is generally detectable in upper and lower respiratory specimens during the acute phase of infection. Negative results do not preclude SARS-CoV-2 infection, do not rule out co-infections with other pathogens, and should not be used as the sole basis for treatment or other patient management decisions. Negative results must be combined with clinical observations, patient history, and epidemiological information. The expected result is Negative. Fact Sheet for Patients: SugarRoll.be Fact Sheet for Healthcare Providers: https://www.woods-mathews.com/ This test is not yet approved or cleared by the [place] FDA and  has been authorized for detection and/or diagnosis of SARS-CoV-2 by FDA under an Emergency Use Authorization (EUA). This EUA will remain  in effect (meaning this test can be used) for the duration of the COVID-19 declaration under Section 56 4(b)(1) of the Act, 21 U.S.C. section 360bbb-3(b)(1), unless the authorization is terminated or revoked sooner. Performed at [facility], [place] [address], [place], [place] [address]           Radiology Studies: DG CHEST PORT 1 VIEW  Result Date: 02/22/2019 CLINICAL DATA:  Shortness of breath.  Abdominal drainage catheter. EXAM: PORTABLE CHEST 1 VIEW COMPARISON:  CT 02/20/2019.  Chest x-ray 02/16/2019. FINDINGS: Heart size stable. Persistent left base atelectasis and infiltrate. Persistent small left pleural effusion. No pneumothorax. Stable position left upper quadrant drainage catheter. IMPRESSION: 1. Persistent left base atelectasis and infiltrate. Persistent small left pleural effusion. No interim change. 2.  Stable position  of left upper quadrant drainage catheter. Electronically Signed   By: Marcello Moores  Register   On: 02/22/2019 08:13   ECHOCARDIOGRAM COMPLETE  Result Date: 02/21/2019   ECHOCARDIOGRAM REPORT   Patient Name:   MAYLEY Gaugh Date of Exam: 02/21/2019 Medical Rec #:  [id number]         Height:       64.0 in Accession #:    [id number]        Weight:       237.9 lb Date of Birth:  15-Jun-1965        BSA:          2.11 m Patient Age:    38 years          BP:           135/92 mmHg Patient Gender: F                 HR:           114 bpm. Exam Location:  Inpatient Procedure: 2D Echo, Color Doppler, Cardiac Doppler and Intracardiac            Opacification Agent Indications:     I26.02 Pulmonary embolus  History:         Patient has no prior history of Echocardiogram examinations.                  Risk Factors:Hypertension, Diabetes and Sleep Apnea. COVID+ on                  07/08/18.  Sonographer:     Raquel Sarna Senior RDCS Referring Phys:  [id number] [place] Diagnosing Phys: Loralie Champagne MD  Sonographer Comments: Technically difficult study due to poor echo windows and patient is morbidly obese. IMPRESSIONS  1. Left ventricular ejection fraction, by visual estimation, is 60 to 65%. The left ventricle has normal function. There is no left ventricular hypertrophy.  2. Definity contrast agent was given IV to delineate the left ventricular endocardial borders.  3. Left  ventricular diastolic parameters are consistent with Grade I diastolic dysfunction (impaired relaxation).  4. The left ventricle has no regional wall motion abnormalities.  5. Poorly visualized RV. From the parasternal long axis, the RV appears dilated and hypokinetic though only a small portion of the RV can be seen. The interventricular septum was D-shaped, suggesting RV pressure/volume overload.  6. Left atrial size was normal.  7. Right atrial size was not well visualized.  8. The mitral valve is normal in structure. No evidence of mitral valve regurgitation. No evidence of mitral stenosis.  9. The tricuspid valve is not well visualized. Tricuspid valve regurgitation is not demonstrated. 10. The aortic valve is tricuspid. Aortic valve regurgitation is not visualized. No evidence of aortic valve sclerosis or stenosis. 11. The inferior vena cava is dilated in size with <50% respiratory variability, suggesting right atrial pressure of 15 mmHg. 12. TR signal is inadequate for assessing pulmonary artery systolic pressure. 13. The interatrial septum was not well visualized. FINDINGS  Left Ventricle: Left ventricular ejection fraction, by visual estimation, is 60 to 65%. The left ventricle has normal function. Definity contrast agent was given IV to delineate the left ventricular endocardial borders. The left ventricle has no regional wall motion abnormalities. The left ventricular internal cavity size was the left ventricle is normal in size. There is no left ventricular hypertrophy. Left ventricular diastolic parameters are consistent with Grade I diastolic dysfunction (impaired relaxation). Right Ventricle: The right ventricular size  is not well visualized. Right vetricular wall thickness was not assessed. Global RV systolic function is was not well visualized. Left Atrium: Left atrial size was normal in size. Right Atrium: Right atrial size was not well visualized Pericardium: There is no evidence of pericardial  effusion. Mitral Valve: The mitral valve is normal in structure. No evidence of mitral valve regurgitation. No evidence of mitral valve stenosis by observation. Tricuspid Valve: The tricuspid valve is not well visualized. Tricuspid valve regurgitation is not demonstrated. Aortic Valve: The aortic valve is tricuspid. Aortic valve regurgitation is not visualized. The aortic valve is structurally normal, with no evidence of sclerosis or stenosis. Pulmonic Valve: The pulmonic valve was not well visualized. Pulmonic valve regurgitation is not visualized. Pulmonic regurgitation is not visualized. Aorta: The aortic root is normal in size and structure. Venous: The inferior vena cava is dilated in size with less than 50% respiratory variability, suggesting right atrial pressure of 15 mmHg. IAS/Shunts: The interatrial septum was not well visualized.  LEFT VENTRICLE PLAX 2D LVIDd:         4.20 cm  Diastology LVIDs:         2.90 cm  LV e' lateral:   7.51 cm/s LV PW:         1.10 cm  LV E/e' lateral: 8.7 LV IVS:        1.00 cm  LV e' medial:    6.85 cm/s LVOT diam:     2.10 cm  LV E/e' medial:  9.5 LV SV:         46 ml LV SV Index:   20.48 LVOT Area:     3.46 cm  RIGHT VENTRICLE RV S prime:     9.03 cm/s TAPSE (M-mode): 1.3 cm LEFT ATRIUM             Index LA diam:        2.70 cm 1.28 cm/m LA Vol (A2C):   41.9 ml 19.90 ml/m LA Vol (A4C):   24.6 ml 11.68 ml/m LA Biplane Vol: 34.7 ml 16.48 ml/m  AORTIC VALVE LVOT Vmax:   73.20 cm/s LVOT Vmean:  50.300 cm/s LVOT VTI:    0.113 m  AORTA Ao Root diam: 3.20 cm Ao Asc diam:  3.30 cm MITRAL VALVE MV Area (PHT): 3.68 cm             SHUNTS MV PHT:        59.74 msec           Systemic VTI:  0.11 m MV Decel Time: 206 msec             Systemic Diam: 2.10 cm MV E velocity: 65.00 cm/s 103 cm/s MV A velocity: 88.30 cm/s 70.3 cm/s MV E/A ratio:  0.74       1.5  Loralie Champagne MD Electronically signed by Loralie Champagne MD Signature Date/Time: 02/21/2019/5:17:45 PM    Final (Updated)    VAS  [place] LOWER EXTREMITY VENOUS (DVT)  Result Date: 02/21/2019  Lower Venous Study Indications: Pain, Swelling, and Palpable Cord.  Risk Factors: Confirmed PE. Anticoagulation: Heparin. Comparison Study: No prior study. Performing Technologist: Baldwin Crown RDMS, RVT  Examination Guidelines: A complete evaluation includes B-mode imaging, spectral Doppler, color Doppler, and power Doppler as needed of all accessible portions of each vessel. Bilateral testing is considered an integral part of a complete examination. Limited examinations for reoccurring indications may be performed as noted.  +---------+---------------+---------+-----------+----------+-------------------+  RIGHT     Compressibility Phasicity Spontaneity Properties  Thrombus Aging       +---------+---------------+---------+-----------+----------+-------------------+  CFV       Full            Yes       Yes                                         +---------+---------------+---------+-----------+----------+-------------------+  SFJ       Full                                                                  +---------+---------------+---------+-----------+----------+-------------------+  FV Prox   Full                                                                  +---------+---------------+---------+-----------+----------+-------------------+  FV Mid    Full                                                                  +---------+---------------+---------+-----------+----------+-------------------+  FV Distal Full                                                                  +---------+---------------+---------+-----------+----------+-------------------+  POP       None            No        No                     Acute                +---------+---------------+---------+-----------+----------+-------------------+  PTV       Full                                             visualized with                                                                   color flow           +---------+---------------+---------+-----------+----------+-------------------+  PERO      None                                                                  +---------+---------------+---------+-----------+----------+-------------------+   +---------+---------------+---------+-----------+----------+--------------+  LEFT      Compressibility Phasicity Spontaneity Properties Thrombus Aging  +---------+---------------+---------+-----------+----------+--------------+  CFV       Full            Yes       Yes                                    +---------+---------------+---------+-----------+----------+--------------+  SFJ       Full                                                             +---------+---------------+---------+-----------+----------+--------------+  FV Prox   Full                                                             +---------+---------------+---------+-----------+----------+--------------+  FV Mid    Full                                                             +---------+---------------+---------+-----------+----------+--------------+  FV Distal Partial                                                          +---------+---------------+---------+-----------+----------+--------------+  PFV       Full                                                             +---------+---------------+---------+-----------+----------+--------------+  POP       None            No        No                     Acute           +---------+---------------+---------+-----------+----------+--------------+  PTV       None                                                             +---------+---------------+---------+-----------+----------+--------------+  PERO      None                                                             +---------+---------------+---------+-----------+----------+--------------+  Poorly visualized bilateral calf veins.    Summary: Right: Findings consistent with  acute deep vein thrombosis involving the right popliteal vein, and right peroneal veins. No cystic structure found in the popliteal fossa. Left: Findings consistent with acute deep vein thrombosis involving the left distal femoral vein, left popliteal vein, left posterior tibial veins, and left peroneal veins. No cystic structure found in the popliteal fossa.  *See table(s) above for measurements and observations. Electronically signed by Monica Martinez MD on 02/21/2019 at 5:02:51 PM.    Final         Scheduled Meds:  amoxicillin-clavulanate  1 tablet Oral BID   enoxaparin (LOVENOX) injection  110 mg Subcutaneous Q12H   feeding supplement (GLUCERNA SHAKE)  237 mL Oral TID BM   feeding supplement (PRO-STAT SUGAR FREE 64)  30 mL Oral BID   insulin aspart  0-15 Units Subcutaneous TID WC   insulin glargine  25 Units Subcutaneous BID   lipase/protease/amylase  36,000 Units Oral With snacks   lipase/protease/amylase  72,000 Units Oral TID WC   metoprolol succinate  50 mg Oral QHS   pantoprazole  40 mg Oral Daily   rosuvastatin  10 mg Oral QHS   Vitamin D (Ergocalciferol)  50,000 Units Oral Q Sat   Continuous Infusions:  Assessment & Plan:   Principal Problem:   Bilateral pulmonary embolism (HCC) Active Problems:   Diabetes mellitus type 2 in obese [facility])   Essential hypertension   Hypomagnesemia   Acute Respiratory Failure with Hypoxia Bilateral Pulmonary Embolism with associated Bilateral DVT's -CTA showed Extensive Bilateral Pulmonary Emboli with CT evidence for Right Heart Strain  -US venous +DVT (see results)  -SARS-CoV-2 NEGATIVE  -Continue supplemental oxygen.  -Was on heparin infusion. Started on Lovenox on 02/22/2019 -Trend troponin level and went from 417 -> 293 -Consult IR due to right heart strain and PCCM for EKOS and Lytics Evaluation -Pulmonary evaluated and discussed the risks and benefits of catheter directed lytics for submassive PE and patient  prefers to take a conservative approach currently with just anticoagulation; if she develops signs of deterioration she could still be considered for EKOS -Will need at least 3 to 6 months of anticoagulation and likely be changed to a NOAC in the next few days -Continue supplemental oxygen via nasal cannula and wean O2 as tolerated with a goal of SPO2 greater than 90% -TOC determining cost of NOAC at the time of discharge    Pleural Effusion  -Had recurrent Thoracentesis last admission -CTA showed "Moderate Left-Sided Pleural Effusion" -Consulted IR for Thoracentesis and possible EKOS but pulmonary recommends deferring thoracentesis until she is safe to hold anticoagulation since there is no compelling evidence of infection toward the bleeding risk with oral and anticoagulation  Diabetes Mellitus, Type 2, uncontrolled with hyperglycemia, improved -Patient's A1C is 7.5% from 12/11. On Janumet XR as an outpatient.  -Had worsened in setting of pancreatectomy and TPN.  -C/w Lantus 25 units sq BID and with Sensitive Novolog SSI AC   Essential Hypertension -Continue Metoprolol Tartrate 25 mg pO BID,  -Currently holding Lisinopril 20 mg po Daily and HCTZ 12.5 mg po Daily -Continue to Monitor BP's per Protocol   Sinus Tachycardia -Persistent. Patient is on Metoprolol 25 mg po BID but has an elevated HR in the setting of PE -Troponin I were initially elevated and trending down and went from 417 -> 293 -Echo shows normal EF.  Primary Malignant Neuroendocrine Pancreatic Tumor -Patient is s/p pancreaticoduodenectomy and hepaticojejunostomy done by Dr. Marcello Moores.  -  General surgery following.  Plan to recheck prealbumin.  Continue percutaneous drains.  Megace was sent but can increase thrombotic risk so will not start. Creon for pancreatic exocrine insufficiency added -Has 3 drains in place -CTA showed "Percutaneous drain in the left upper quadrant with significant interval improvement in the  previously demonstrated collection. A small residual collection remains."   Normocytic Anemia  -? Dilutional Drop from IVF -No signs of bleeding -Continue to follow hemoglobin.  Leukocytosis -In setting of Bilateral PE and DVT's.  -Blood cultures with no growth to date at 2 days -On Augmentin from prior Whipple Surgery , to continue Trending down   Hypomagnesemia, improved -Mag Level was 1.6 and improved to 1.8 -Continue to Monitor and Replete as Necessary    Hypokalemia- Likely from loose stools.  We will replace Monitor   Anxiety -Patient states she has panic attacks -Continue Clonazepam 0.25-0.5 mg po DailyprnAnxiety  Right Foot Drop -Patient has a brace -Will obtain PT/OT Evaluate to Treat , especially since she feels weak and is on a/c.  Morbid Obesity -Estimated body mass index is 40.68 kg/m as calculated from the following:   Height as of this encounter: 5\' 4"  (1.626 m).   Weight as of this encounter: 107.5 kg. -Weight Loss and Dietary Counseling given   DVT prophylaxis: Anticoagulated with therapeutic Lovenox. Code Status: FULL CODE  Family Communication: Discussed with husband at the bedside Disposition Plan: PT/OT ordered. Return home once respiratory status close to baseline.          LOS: 3 days   Time spent: 45 minutes with >50% on coc    Nolberto Hanlon, MD [facility] Pager [phone]  If 7PM-7AM, please contact night-coverage [email or web address] Password [facility] 02/23/2019, 12:40 PM

## 2019-02-23 NOTE — Progress Notes (Addendum)
Patient states she is feeling nauseous and feels like swallowing morning medications made her nauseous. PRN dose of Zofran given. Patient has had 2 loose stools today and is requesting Imodium PRN; Kurtis Bushman, MD paged.

## 2019-02-23 NOTE — Evaluation (Signed)
Physical Therapy Evaluation Patient Details Name: Leah Farmer MRN: [id number] DOB: 25-Mar-1965 Today's Date: 02/23/2019   History of Present Illness  Pt is a 54 y/o female with PMH including but not limited to anxiety, depression, osteoarthritis of the lower back, type 2 diabetes, GERD, history of urolithiasis, history of NASH, history of tachycardia who last month on January 15, 2019 underwent Whipple procedure with distal pancreatic resection due to melena and neuroendocrine tumor of the pancreas.  Postop.  Was complicated by HCAP.  She was discharged on 1/05 2020, but was readmitted overnight for reinsertion no drains from 1/19 to 02/19/2018.  She was discharged home earlier today and now is coming into the emergency department due to sudden onset dyspnea. Pt found to have bilateral acute DVTs and bilateral PEs and was started on heparin drip.    Clinical Impression  Pt presented supine in bed with HOB elevated, awake and willing to participate in therapy session. Prior to admission, pt was ambulating with use of a cane and a RW. Pt's husband present throughout evaluation, attentive and eager to assist pt as he has been doing at home. At the time of evaluation, pt very limited secondary to fatigue. She performed bed mobility with supervision and sat EOB without UE supports and supervision for ~30 mins. Pt on 1L of O2 throughout with SPO2 maintaining at 90-93%. PT encouraged pursed lip breathing and increasing activity as tolerated. Pt would continue to benefit from skilled physical therapy services at this time while admitted and after d/c to address the below listed limitations in order to improve overall safety and independence with functional mobility.     Follow Up Recommendations Home health PT;Supervision for mobility/OOB    Equipment Recommendations  None recommended by PT    Recommendations for Other Services       Precautions / Restrictions Precautions Precautions:  Fall Precaution Comments: 3 abdominal drains Restrictions Weight Bearing Restrictions: No      Mobility  Bed Mobility Overal bed mobility: Needs Assistance Bed Mobility: Supine to Sit     Supine to sit: Supervision     General bed mobility comments: for safety with drains  Transfers                 General transfer comment: pt deferred  Ambulation/Gait                Stairs            Wheelchair Mobility    Modified Rankin (Stroke Patients Only)       Balance Overall balance assessment: Needs assistance Sitting-balance support: No upper extremity supported;Feet supported Sitting balance-Leah Farmer: Good                                       Pertinent Vitals/Pain Pain Assessment: Faces Faces Pain Farmer: Hurts a little bit Pain Location: incisions/drain sites Pain Descriptors / Indicators: Discomfort Pain Intervention(s): Monitored during session    Home Living Family/patient expects to be discharged to:: Private residence Living Arrangements: Spouse/significant other Available Help at Discharge: Family;Available 24 hours/day(son is going back to school this week so trying to find additional help) Type of Home: House Home Access: Stairs to enter   [employer] of Steps: 1 Home Layout: One level Home Equipment: [place] - single point;Walker - 2 wheels;Other (comment)      Prior Function Level of Independence: Independent with assistive device(s)  Gait / Transfers Assistance Needed: ambulates with either a cane or RW           Hand Dominance        Extremity/Trunk Assessment   Upper Extremity Assessment Upper Extremity Assessment: Generalized weakness    Lower Extremity Assessment Lower Extremity Assessment: Generalized weakness;RLE deficits/detail RLE Deficits / Details: pt with decreased sensation to dorsal and plantar aspect of entire foot; pt also with 0/5 ankle DF       Communication    Communication: No difficulties  Cognition Arousal/Alertness: Awake/alert Behavior During Therapy: WFL for tasks assessed/performed Overall Cognitive Status: Within Functional Limits for tasks assessed                                        General Comments      Exercises     Assessment/Plan    PT Assessment Patient needs continued PT services  PT Problem List Decreased strength;Decreased range of motion;Decreased mobility;Decreased activity tolerance;Decreased balance;Decreased coordination;Decreased knowledge of use of DME;Decreased safety awareness;Decreased knowledge of precautions;Cardiopulmonary status limiting activity       PT Treatment Interventions DME instruction;Gait training;Stair training;Functional mobility training;Therapeutic activities;Balance training;Therapeutic exercise;Neuromuscular re-education;Patient/family education    PT Goals (Current goals can be found in the Care Plan section)  Acute Rehab PT Goals Patient Stated Goal: "to go home" PT Goal Formulation: With patient/family Time For Goal Achievement: 03/09/19 Potential to Achieve Goals: Good    Frequency Min 3X/week   Barriers to discharge        Co-evaluation               AM-PAC PT "6 Clicks" Mobility  Outcome Measure Help needed turning from your back to your side while in a flat bed without using bedrails?: None Help needed moving from lying on your back to sitting on the side of a flat bed without using bedrails?: None Help needed moving to and from a bed to a chair (including a wheelchair)?: None Help needed standing up from a chair using your arms (e.g., wheelchair or bedside chair)?: A Little Help needed to walk in hospital room?: A Little Help needed climbing 3-5 steps with a railing? : A Little 6 Click Score: 21    End of Session   Activity Tolerance: Patient limited by fatigue Patient left: in bed;with call bell/phone within reach;with family/visitor  present;Other (comment)(seated EOB) Nurse Communication: Mobility status PT Visit Diagnosis: Other abnormalities of gait and mobility (R26.89);Muscle weakness (generalized) (M62.81)    Time: [id number] PT Time Calculation (min) (ACUTE ONLY): 47 min   Charges:   PT Evaluation $PT Eval Moderate Complexity: 1 Mod PT Treatments $Therapeutic Activity: 23-37 mins        Anastasio Champion, DPT  Acute Rehabilitation Services Pager [phone] Office [place] 02/23/2019, 3:21 PM

## 2019-02-24 DIAGNOSIS — E1165 Type 2 diabetes mellitus with hyperglycemia: Secondary | ICD-10-CM

## 2019-02-24 DIAGNOSIS — D3A8 Other benign neuroendocrine tumors: Secondary | ICD-10-CM

## 2019-02-24 DIAGNOSIS — T8143XA Infection following a procedure, organ and space surgical site, initial encounter: Secondary | ICD-10-CM

## 2019-02-24 DIAGNOSIS — E876 Hypokalemia: Secondary | ICD-10-CM

## 2019-02-24 DIAGNOSIS — R638 Other symptoms and signs concerning food and fluid intake: Secondary | ICD-10-CM

## 2019-02-24 DIAGNOSIS — R197 Diarrhea, unspecified: Secondary | ICD-10-CM

## 2019-02-24 DIAGNOSIS — K909 Intestinal malabsorption, unspecified: Secondary | ICD-10-CM

## 2019-02-24 DIAGNOSIS — K8689 Other specified diseases of pancreas: Secondary | ICD-10-CM

## 2019-02-24 DIAGNOSIS — R112 Nausea with vomiting, unspecified: Secondary | ICD-10-CM

## 2019-02-24 LAB — MAGNESIUM: Magnesium: 1.7 mg/dL (ref 1.7–2.4)

## 2019-02-24 LAB — CBC
HCT: 34.3 % — ABNORMAL LOW (ref 36.0–46.0)
Hemoglobin: 10.4 g/dL — ABNORMAL LOW (ref 12.0–15.0)
MCH: 27.4 pg (ref 26.0–34.0)
MCHC: 30.3 g/dL (ref 30.0–36.0)
MCV: 90.5 fL (ref 80.0–100.0)
Platelets: 328 10*3/uL (ref 150–400)
RBC: 3.79 MIL/uL — ABNORMAL LOW (ref 3.87–5.11)
RDW: 15.9 % — ABNORMAL HIGH (ref 11.5–15.5)
WBC: 8.5 10*3/uL (ref 4.0–10.5)
nRBC: 0 % (ref 0.0–0.2)

## 2019-02-24 LAB — BASIC METABOLIC PANEL
Anion gap: 11 (ref 5–15)
BUN: 10 mg/dL (ref 6–20)
CO2: 23 mmol/L (ref 22–32)
Calcium: 8.6 mg/dL — ABNORMAL LOW (ref 8.9–10.3)
Chloride: 110 mmol/L (ref 98–111)
Creatinine, Ser: 0.84 mg/dL (ref 0.44–1.00)
GFR calc Af Amer: >60 mL/min (ref >60)
GFR calc non Af Amer: >60 mL/min (ref >60)
Glucose, Bld: 81 mg/dL (ref 70–99)
Potassium: 3.2 mmol/L — ABNORMAL LOW (ref 3.5–5.1)
Sodium: 144 mmol/L (ref 135–145)

## 2019-02-24 LAB — GLUCOSE, CAPILLARY
Glucose-Capillary: 84 mg/dL (ref 70–99)
Glucose-Capillary: 119 mg/dL — ABNORMAL HIGH (ref 70–99)
Glucose-Capillary: 112 mg/dL — ABNORMAL HIGH (ref 70–99)
Glucose-Capillary: 124 mg/dL — ABNORMAL HIGH (ref 70–99)

## 2019-02-24 MED ORDER — POTASSIUM CHLORIDE CRYS ER 20 MEQ PO TBCR
40.0000 meq | EXTENDED_RELEASE_TABLET | ORAL | Status: DC
  Filled 2019-02-24: qty 2

## 2019-02-24 MED ORDER — POTASSIUM CHLORIDE 10 MEQ/100ML IV SOLN
INTRAVENOUS | Status: AC
  Filled 2019-02-24: qty 100

## 2019-02-24 MED ORDER — POTASSIUM CHLORIDE 10 MEQ/100ML IV SOLN
10.0000 meq | INTRAVENOUS | Status: AC
  Administered 2019-02-24 (×5): 10 meq via INTRAVENOUS
  Filled 2019-02-24 (×4): qty 100

## 2019-02-24 MED ORDER — MAGNESIUM SULFATE 2 GM/50ML IV SOLN
2.0000 g | Freq: Once | INTRAVENOUS | Status: AC
  Administered 2019-02-24: 2 g via INTRAVENOUS
  Filled 2019-02-24 (×2): qty 50

## 2019-02-24 MED ORDER — SACCHAROMYCES BOULARDII 250 MG PO CAPS
250.0000 mg | ORAL_CAPSULE | Freq: Two times a day (BID) | ORAL | Status: DC
  Administered 2019-02-24 – 2019-02-25 (×2): 250 mg via ORAL
  Filled 2019-02-24 (×2): qty 1

## 2019-02-24 MED ORDER — ENOXAPARIN SODIUM 100 MG/ML ~~LOC~~ SOLN: 100.0000 mg | Freq: Two times a day (BID) | SUBCUTANEOUS | 0 refills | Status: DC

## 2019-02-24 MED ORDER — SODIUM CHLORIDE 0.9 % IV SOLN
8.0000 mg | Freq: Three times a day (TID) | INTRAVENOUS | Status: DC | PRN
  Filled 2019-02-24: qty 4

## 2019-02-24 MED ORDER — POTASSIUM CHLORIDE 10 MEQ/100ML IV SOLN
INTRAVENOUS | Status: AC
  Administered 2019-02-24: 10 meq
  Filled 2019-02-24: qty 100

## 2019-02-24 MED ORDER — SODIUM CHLORIDE 0.9 % IV SOLN
INTRAVENOUS | Status: DC | PRN
  Administered 2019-02-24: 1000 mL via INTRAVENOUS

## 2019-02-24 MED ORDER — APIXABAN 5 MG PO TABS: 5.0000 mg | ORAL_TABLET | Freq: Two times a day (BID) | ORAL | 1 refills | Status: DC

## 2019-02-24 MED ORDER — ONDANSETRON HCL 4 MG PO TABS
8.0000 mg | ORAL_TABLET | Freq: Three times a day (TID) | ORAL | Status: DC | PRN
  Administered 2019-02-24 – 2019-02-25 (×2): 8 mg via ORAL
  Filled 2019-02-24 (×3): qty 2

## 2019-02-24 NOTE — Progress Notes (Addendum)
Patient refuses 0900 scheduled dose of potassium PO 40 mEq tablet, states it makes her nauseous. Nurse suggested potassium powder PO form to dissolve in water, patient refused and states it makes her vomit. Cyndia Skeeters, MD paged.   Nurse attempted to break PO potassium tablet in half and put in applesauce. Patient agreed to try but vomited 100 mL of green-yellow emesis directly after swallowing. Patient currently tearful and states "I am tired of getting sick and the potassium makes me sick". Patient's husband currently at bedside, requesting to speak with MD. Cyndia Skeeters, MD paged.

## 2019-02-24 NOTE — Progress Notes (Signed)
Patient weaned to room air from 2 liters via nasal cannula. Current oxygen saturation is 93% on room air. Nurse will continue to monitor oxygen saturation throughout day.

## 2019-02-24 NOTE — Progress Notes (Addendum)
Patient continues to refuse oral medications and states she feels nauseous. PRN dose of Zofran given at 13:35 with some  improvement to the nausea, however, patient's husband states "I do not want her taking anymore oral medications because that is making her more sick and more nauseous." Patient states she is in agreement with her husband and refuses oral Creon and Florastor. Currently, patient lying in bed attempting to eat dinner tray, IV potassium running through IV at 50 mL per hour due to slight burning at IV site. IV site was flushed with blood return noted.

## 2019-02-24 NOTE — Progress Notes (Signed)
PROGRESS NOTE  Leah Farmer [id number] DOB: 1965-12-31   PCP: Margy Clarks, NP  Patient is from: Home.  DOA: 02/20/2019 LOS: 4  Brief Narrative / Interim history: 53 year old female with history of DM-2, neuroendocrine tumor of pancreas status post Whipple procedure with distal pancreatic resection in 01/2019, NASH, anxiety, depression, GERD and osteoarthritis presented with dyspnea on 02/20/2019 and admitted with respiratory failure with hypoxia in the setting of bilateral PE with right heart strain/ bilateral DVT, pleural effusion and possible pneumonia.  Initially started on IV heparin, and transitioned to subcu Lovenox.  IR and PCCM consulted for EKOS and lytic evaluation but recommended conservative approach after meeting and discussing with patient's.   Patient has 3 abdominal drains which are placed previous hospitalization.  General surgery following.  Subjective: No major events overnight or this morning.  Feels better but continues to endorse nausea and diarrhea.  Denies chest pain or dyspnea.  Somewhat anxious about the blood clot.  Patient had an episode of emesis later in the morning after she took her p.o. potassium.   Objective: Vitals:   02/24/19 0518 02/24/19 0833 02/24/19 1055 02/24/19 1056  BP: 127/82 123/81    Pulse: 90 97    Resp: 16 20    Temp: 98.2 F (36.8 C) (!) 97.5 F (36.4 C)    TempSrc: Oral Oral    SpO2: 93% 94% 94% 93%  Weight: 107.7 kg     Height:        Intake/Output Summary (Last 24 hours) at 02/24/2019 1411 Last data filed at 02/24/2019 1200 Gross per 24 hour  Intake 480 ml  Output 225 ml  Net 255 ml   Filed Weights   02/22/19 0427 02/23/19 0406 02/24/19 0518  Weight: 107.5 kg 108.2 kg 107.7 kg    Examination:  GENERAL: No acute distress.  Appears well.  HEENT: MMM.  Vision and hearing grossly intact.  NECK: Supple.  No apparent JVD.  RESP:  No IWOB.  Diminished aeration bilaterally. CVS:  RRR. Heart sounds normal.    ABD/GI/GU: Bowel sounds present. Soft. Non tender.  Two abdominal drains on the right and one on the left with small purulent material. MSK/EXT:  Moves extremities. No apparent deformity. No edema.  SKIN: no apparent skin lesion or wound NEURO: Awake, alert and oriented appropriately.  No apparent focal neuro deficit. PSYCH: Calm. Normal affect.   Procedures:  None  Assessment & Plan: Acute respiratory failure with hypoxia: Likely due to extensive bilateral PE, and pleural effusion. -Management of PE and pleural effusion as below. -Wean oxygen as able -Encourage incentive spirometry -OOB/PT/OT  Bilateral PE with evidence of heart strain-likely provoked in the setting of recent surgery Bilateral DVT -EKOS and lytic therapy entertained by IR and PCCM but patient preferred conservative approach -Continue subcu Lovenox twice daily-we will discharge on Lovenox at least for 1 month, then NOACs -Need at least 3 to 6 months of anticoagulation -Will send prescription for Lovenox to her pharmacy to ensure her insurance covers -Wean oxygen as able.  Pleural effusion: CTA chest revealed moderate left-sided pleural effusion. Had recurrent thoracocentesis last admission -PCCM recommends deferring thoracocentesis this time until she is safe to hold anticoagulation  Uncontrolled diabetes with hyperglycemia: A1c 7.5% on 01/11/2020. Recent Labs    02/23/19 2050 02/24/19 0618 02/24/19 1233  GLUCAP 109* 84 119*  -Continue current regimen with a statin.  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 Marcello Moores in 01/2019. Intra-abdominal fluid collections-CTA showed improvement Pancreatic  insufficiency -General surgery following  -Continue percutaneous drains  -Continue creon and supplements.  Megace discontinued due to increased risk of VTE  -Continue Augmentin from prior Whipple surgery-need to clarify duration of  treatment.  -Continue supportive care with antiemetics, Imodium and probiotics  Nausea/vomiting: Struggling with oral medications.  She had emesis after oral potassium. -Increase Zofran to 8 mg every 8 hours  Essential hypertension: Normotensive. -Continue metoprolol -Lisinopril and HCTZ on hold  Sinus tachycardia: Likely due to PE.  Resolved. -Metoprolol as above.  Elevated troponin: Likely demand ischemia from heart strain in the setting of PE.  Echo with normal EF. -No further work-up  Normocytic anemia: Hgb 9-10 (baseline)> 12.2 (admit)> 10.1 > 10.4.  FOBT positive but H&H stable and at baseline. She is also on therapeutic dose anticoagulation which cannot be interrupted at this time.  Initial value might be falsely elevated from hemoconcentration. -Check anemia panel -Continue monitoring   Hypokalemia/hypomagnesemia -Replenish potassium with IV KCl due to emesis -We will give 2 g of IV magnesium sulfate.  Anxiety/depression: Stable -Continue Klonopin as needed  Right foot drop: Chronic -Continue brace, PT/OT  Morbid obesity: BMI 40.75 -Encourage lifestyle change to lose weight once she recovers from acute illness.  Increase nutritional need due to acute illness, pancreatic insufficiency and poor p.o. intake Nutrition Problem: Inadequate oral intake Etiology: decreased appetite, nausea  Signs/Symptoms: per patient/family report  Interventions: Glucerna shake, Prostat   DVT prophylaxis: On therapeutic Lovenox Code Status: Full code Family Communication: Updated patient's husband over the phone Disposition Plan: Remains inpatient until she tolerates p.o. well and respiratory failure resolved. Consultants: PCCM (off), IR (off) and general surgery   Microbiology summarized: [id number] negative. Blood cultures negative.  Sch Meds:  Scheduled Meds: . amoxicillin-clavulanate  1 tablet Oral BID  . enoxaparin (LOVENOX) injection  110 mg Subcutaneous Q12H  . feeding  supplement (GLUCERNA SHAKE)  237 mL Oral TID BM  . feeding supplement (PRO-STAT SUGAR FREE 64)  30 mL Oral BID  . insulin aspart  0-15 Units Subcutaneous TID WC  . insulin glargine  25 Units Subcutaneous BID  . lipase/protease/amylase  36,000 Units Oral With snacks  . lipase/protease/amylase  72,000 Units Oral TID WC  . metoprolol succinate  50 mg Oral QHS  . pantoprazole  40 mg Oral Daily  . rosuvastatin  10 mg Oral QHS  . Vitamin D (Ergocalciferol)  50,000 Units Oral Q Sat   Continuous Infusions: . sodium chloride 1,000 mL (02/24/19 1149)  . ondansetron (ZOFRAN) IV    . potassium chloride 10 mEq (02/24/19 1328)   PRN Meds:.sodium chloride, clonazepam, loperamide, meclizine, ondansetron **OR** ondansetron (ZOFRAN) IV, oxyCODONE, promethazine  Antimicrobials: Anti-infectives (From admission, onward)   Start     Dose/Rate Route Frequency Ordered Stop   02/21/19 1000  amoxicillin-clavulanate (AUGMENTIN) 875-125 MG per tablet 1 tablet    Note to Pharmacy: 14 day starting on 02/19/2019     1 tablet Oral 2 times daily 02/21/19 0551         I have personally reviewed the following labs and images: CBC: Recent Labs  Lab 02/18/19 0300 02/18/19 0300 02/20/19 1815 02/21/19 0411 02/22/19 0017 02/23/19 0618 02/24/19 0534  WBC 10.5   < > 16.3* 12.2* 13.3* 10.1 8.5  NEUTROABS 6.8  --  13.4*  --  8.3*  --   --   HGB 9.6*   < > 12.2 11.4* 10.7* 10.1* 10.4*  HCT 32.4*   < > 40.7 38.4 35.4* 33.5* 34.3*  MCV  90.8   < > 91.1 91.2 90.3 90.8 90.5  PLT 358   < > 422* 311 379 328 328   < > = values in this interval not displayed.   BMP &GFR Recent Labs  Lab 02/20/19 1815 02/20/19 2031 02/21/19 0411 02/22/19 0017 02/23/19 0618 02/24/19 0534  NA 140  --  141 140 141 144  K 3.6  --  3.6 3.3* 3.2* 3.2*  CL 106  --  109 107 109 110  CO2 20*  --  19* 21* 22 23  GLUCOSE 169*  --  159* 100* 93 81  BUN 9  --  7 7 9 10   CREATININE 0.84  --  0.88 0.90 0.83 0.84  CALCIUM 8.7*  --  8.3*  8.1* 8.4* 8.6*  MG  --  1.6* 2.0 1.8  --  1.7  PHOS  --  3.7 3.8 3.4  --   --    Estimated Creatinine Clearance: 93.9 mL/min (by C-G formula based on SCr of 0.84 mg/dL). Liver & Pancreas: Recent Labs  Lab 02/20/19 1815 02/21/19 0411 02/22/19 0017  AST 36 30 28  ALT 32 27 25  ALKPHOS 90 67 69  BILITOT 0.7 0.6 0.3  PROT 7.6 6.2* 6.4*  ALBUMIN 2.8* 2.1* 2.2*   Recent Labs  Lab 02/20/19 1815  LIPASE 26   No results for input(s): AMMONIA in the last 168 hours. Diabetic: No results for input(s): HGBA1C in the last 72 hours. Recent Labs  Lab 02/23/19 1300 02/23/19 1656 02/23/19 2050 02/24/19 0618 02/24/19 1233  GLUCAP 121* 107* 109* 84 119*   Cardiac Enzymes: No results for input(s): CKTOTAL, CKMB, CKMBINDEX, TROPONINI in the last 168 hours. No results for input(s): PROBNP in the last 8760 hours. Coagulation Profile: No results for input(s): INR, PROTIME in the last 168 hours. Thyroid Function Tests: No results for input(s): TSH, T4TOTAL, FREET4, T3FREE, THYROIDAB in the last 72 hours. Lipid Profile: No results for input(s): CHOL, HDL, LDLCALC, TRIG, CHOLHDL, LDLDIRECT in the last 72 hours. Anemia Panel: No results for input(s): VITAMINB12, FOLATE, FERRITIN, TIBC, IRON, RETICCTPCT in the last 72 hours. Urine analysis:    Component Value Date/Time   COLORURINE YELLOW 01/19/2019 1656   APPEARANCEUR HAZY (A) 01/19/2019 1656   LABSPEC 1.025 01/19/2019 1656   PHURINE 6.0 01/19/2019 1656   GLUCOSEU NEGATIVE 01/19/2019 1656   HGBUR SMALL (A) 01/19/2019 1656   BILIRUBINUR NEGATIVE 01/19/2019 1656   KETONESUR NEGATIVE 01/19/2019 1656   PROTEINUR 100 (A) 01/19/2019 1656   NITRITE NEGATIVE 01/19/2019 1656   LEUKOCYTESUR NEGATIVE 01/19/2019 1656   Sepsis Labs: Invalid input(s): PROCALCITONIN, [place]  Microbiology: Recent Results (from the past 240 hour(s))  SARS CORONAVIRUS 2 (TAT 6-24 HRS) Nasopharyngeal Nasopharyngeal Swab     Status: None   Collection Time:  02/15/19  6:34 PM   Specimen: Nasopharyngeal Swab  Result Value Ref Range Status   SARS Coronavirus 2 NEGATIVE NEGATIVE Final    Comment: (NOTE) SARS-CoV-2 target nucleic acids are NOT DETECTED. The SARS-CoV-2 RNA is generally detectable in upper and lower respiratory specimens during the acute phase of infection. Negative results do not preclude SARS-CoV-2 infection, do not rule out co-infections with other pathogens, and should not be used as the sole basis for treatment or other patient management decisions. Negative results must be combined with clinical observations, patient history, and epidemiological information. The expected result is Negative. Fact Sheet for Patients: SugarRoll.be Fact Sheet for Healthcare Providers: https://www.woods-mathews.com/ This test is not yet approved or  cleared by the [place] and  has been authorized for detection and/or diagnosis of SARS-CoV-2 by FDA under an Emergency Use Authorization (EUA). This EUA will remain  in effect (meaning this test can be used) for the duration of the COVID-19 declaration under Section 56 4(b)(1) of the Act, 21 U.S.C. section 360bbb-3(b)(1), unless the authorization is terminated or revoked sooner. Performed at [facility], [address] [place], [place] [address]   Culture, blood (Routine X 2) w Reflex to ID Panel     Status: None (Preliminary result)   Collection Time: 02/20/19  8:31 PM   Specimen: BLOOD  Result Value Ref Range Status   Specimen Description BLOOD LEFT ANTECUBITAL  Final   Special Requests   Final    BOTTLES DRAWN AEROBIC AND ANAEROBIC Blood Culture adequate volume   Culture   Final    NO GROWTH 4 DAYS Performed at [facility], [address], [place], [place] [address]    Report Status PENDING  Incomplete  Culture, blood (Routine X 2) w Reflex to ID Panel     Status: None (Preliminary result)   Collection Time: 02/20/19  8:31 PM    Specimen: BLOOD RIGHT HAND  Result Value Ref Range Status   Specimen Description BLOOD RIGHT HAND  Final   Special Requests   Final    BOTTLES DRAWN AEROBIC AND ANAEROBIC Blood Culture adequate volume   Culture   Final    NO GROWTH 4 DAYS Performed at [facility], [address] [place], [place] [address]    Report Status PENDING  Incomplete  SARS CORONAVIRUS 2 (TAT 6-24 HRS) Nasopharyngeal Nasopharyngeal Swab     Status: None   Collection Time: 02/20/19  8:33 PM   Specimen: Nasopharyngeal Swab  Result Value Ref Range Status   SARS Coronavirus 2 NEGATIVE NEGATIVE Final    Comment: (NOTE) SARS-CoV-2 target nucleic acids are NOT DETECTED. The SARS-CoV-2 RNA is generally detectable in upper and lower respiratory specimens during the acute phase of infection. Negative results do not preclude SARS-CoV-2 infection, do not rule out co-infections with other pathogens, and should not be used as the sole basis for treatment or other patient management decisions. Negative results must be combined with clinical observations, patient history, and epidemiological information. The expected result is Negative. Fact Sheet for Patients: SugarRoll.be Fact Sheet for Healthcare Providers: https://www.woods-mathews.com/ This test is not yet approved or cleared by the [place] FDA and  has been authorized for detection and/or diagnosis of SARS-CoV-2 by FDA under an Emergency Use Authorization (EUA). This EUA will remain  in effect (meaning this test can be used) for the duration of the COVID-19 declaration under Section 56 4(b)(1) of the Act, 21 U.S.C. section 360bbb-3(b)(1), unless the authorization is terminated or revoked sooner. Performed at [facility], [place] [address], [place],  [address]     Radiology Studies: No results found.  45 minutes with more than 50% spent in reviewing records, counseling patient/family and coordinating  care.   Mahin Guardia T. [place]  If 7PM-7AM, please contact night-coverage [email or web address] Password [facility] 02/24/2019, 2:11 PM

## 2019-02-25 DIAGNOSIS — F329 Major depressive disorder, single episode, unspecified: Secondary | ICD-10-CM

## 2019-02-25 DIAGNOSIS — F419 Anxiety disorder, unspecified: Secondary | ICD-10-CM

## 2019-02-25 DIAGNOSIS — R188 Other ascites: Secondary | ICD-10-CM

## 2019-02-25 DIAGNOSIS — D649 Anemia, unspecified: Secondary | ICD-10-CM

## 2019-02-25 LAB — COMPREHENSIVE METABOLIC PANEL
ALT: 14 U/L (ref 0–44)
AST: 15 U/L (ref 15–41)
Albumin: 2 g/dL — ABNORMAL LOW (ref 3.5–5.0)
Alkaline Phosphatase: 58 U/L (ref 38–126)
Anion gap: 11 (ref 5–15)
BUN: 8 mg/dL (ref 6–20)
CO2: 24 mmol/L (ref 22–32)
Calcium: 8.4 mg/dL — ABNORMAL LOW (ref 8.9–10.3)
Chloride: 107 mmol/L (ref 98–111)
Creatinine, Ser: 0.94 mg/dL (ref 0.44–1.00)
GFR calc Af Amer: >60 mL/min (ref >60)
GFR calc non Af Amer: >60 mL/min (ref >60)
Glucose, Bld: 76 mg/dL (ref 70–99)
Potassium: 3.7 mmol/L (ref 3.5–5.1)
Sodium: 142 mmol/L (ref 135–145)
Total Bilirubin: 0.2 mg/dL — ABNORMAL LOW (ref 0.3–1.2)
Total Protein: 5.8 g/dL — ABNORMAL LOW (ref 6.5–8.1)

## 2019-02-25 LAB — CULTURE, BLOOD (ROUTINE X 2)
Culture: NO GROWTH
Culture: NO GROWTH
Special Requests: ADEQUATE
Special Requests: ADEQUATE

## 2019-02-25 LAB — CBC
HCT: 35.3 % — ABNORMAL LOW (ref 36.0–46.0)
Hemoglobin: 10.6 g/dL — ABNORMAL LOW (ref 12.0–15.0)
MCH: 27 pg (ref 26.0–34.0)
MCHC: 30 g/dL (ref 30.0–36.0)
MCV: 90.1 fL (ref 80.0–100.0)
Platelets: 320 10*3/uL (ref 150–400)
RBC: 3.92 MIL/uL (ref 3.87–5.11)
RDW: 15.9 % — ABNORMAL HIGH (ref 11.5–15.5)
WBC: 8.8 10*3/uL (ref 4.0–10.5)
nRBC: 0 % (ref 0.0–0.2)

## 2019-02-25 LAB — HEPARIN ANTI-XA: Heparin LMW: 1.18 IU/mL

## 2019-02-25 LAB — GLUCOSE, CAPILLARY
Glucose-Capillary: 82 mg/dL (ref 70–99)
Glucose-Capillary: 119 mg/dL — ABNORMAL HIGH (ref 70–99)

## 2019-02-25 LAB — MAGNESIUM: Magnesium: 2.2 mg/dL (ref 1.7–2.4)

## 2019-02-25 MED ORDER — PROMETHAZINE HCL 25 MG/ML IJ SOLN: 12.5000 mg | Freq: Four times a day (QID) | INTRAMUSCULAR | Status: DC | PRN

## 2019-02-25 MED ORDER — METOCLOPRAMIDE HCL 10 MG PO TABS: 10.0000 mg | ORAL_TABLET | Freq: Three times a day (TID) | ORAL | Status: DC

## 2019-02-25 MED ORDER — SACCHAROMYCES BOULARDII 250 MG PO CAPS: 250.0000 mg | ORAL_CAPSULE | Freq: Two times a day (BID) | ORAL | 0 refills | Status: DC

## 2019-02-25 MED ORDER — METOCLOPRAMIDE HCL 10 MG PO TABS: 10.0000 mg | ORAL_TABLET | Freq: Three times a day (TID) | ORAL | 3 refills | Status: DC

## 2019-02-25 MED ORDER — LISINOPRIL 20 MG PO TABS: 20.0000 mg | ORAL_TABLET | Freq: Every day | ORAL | 1 refills | Status: AC

## 2019-02-25 MED ORDER — ENOXAPARIN SODIUM 100 MG/ML ~~LOC~~ SOLN: 100.0000 mg | Freq: Two times a day (BID) | SUBCUTANEOUS | 0 refills | Status: DC

## 2019-02-25 MED ORDER — ONDANSETRON HCL 8 MG PO TABS: 8.0000 mg | ORAL_TABLET | Freq: Three times a day (TID) | ORAL | 3 refills | Status: AC | PRN

## 2019-02-25 MED ORDER — METOCLOPRAMIDE HCL 5 MG/ML IJ SOLN: 10.0000 mg | Freq: Four times a day (QID) | INTRAMUSCULAR | Status: DC

## 2019-02-25 MED ORDER — MIRTAZAPINE 15 MG PO TABS: 7.5000 mg | ORAL_TABLET | Freq: Every day | ORAL | 1 refills | Status: DC

## 2019-02-25 MED ORDER — LOPERAMIDE HCL 2 MG PO CAPS: 2.0000 mg | ORAL_CAPSULE | ORAL | 0 refills | Status: DC | PRN

## 2019-02-25 NOTE — Discharge Summary (Signed)
Physician Discharge Summary  Leah Farmer TIR:[id number] DOB: 1965/03/14 DOA: 02/20/2019  PCP: Margy Clarks, NP  Admit date: 02/20/2019 Discharge date: 02/25/2019  Admitted From: Home Disposition: Home  Recommendations for Outpatient Follow-up:  1. Follow ups as below. 2. Please obtain CBC/BMP/Mag at follow up 3. Please follow up on the following pending results: None  Home Health: PT Equipment/Devices: None  Discharge Condition: Stable CODE STATUS: Full code  Follow-up Information    Stark Klein, MD Follow up in 2 week(s).   Specialty: General Surgery Why: 1-2 weeks.  Our office will make arrangements. Contact information: [address] [place] [address] [phone]        Margy Clarks, NP. Schedule an appointment as soon as possible for a visit in 1 week(s).   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  Initially started on IV heparin, and transitioned to subcu Lovenox.  IR and PCCM consulted for EKOS and lytic evaluation but recommended conservative approach after meeting and discussing with patient's.   Patient remained stable on subcu Lovenox injection for PE and bilateral DVT.  Respiratory failure resolved.  She was ambulated and maintained appropriate saturation on room air.  Discharged on subcu Lovenox 100 mg every 12 hours for 1 months followed by Eliquis 5 mg twice daily for a total of 3 to 6 months.  Megace and OCP discontinued.   Patient was also followed by  general surgery during this hospitalization.  She was started on Creon and Reglan for hepatic insufficiency, nausea and vomiting.  Patient has 3 abdominal drains which are placed previous hospitalization.   She would be discharged with abdominal drains.  Patient will follow up with general surgery and IR outpatient.  See individual problem list below for more on hospital course.  Discharge Diagnoses:  Acute respiratory failure with hypoxia: Likely due to extensive bilateral PE, and pleural effusion.  Ambulated on room air and maintain appropriate saturation.  -Management of PE and pleural effusion as below. -Encouraged to continue incentive spirometry  Bilateral provoked PE with evidence of heart strain Bilateral provoked DVT -likely provoked in the setting of recent surgery, Megace  -EKOS and lytic therapy entertained by IR and PCCM but patient preferred conservative approach -Continue subcu Lovenox twice daily-we will discharge on Lovenox at least for 1 month, then NOACs -Discharged on Lovenox for 1 month, followed by Eliquis.  Needs anticoagulation for 6 months.  Pleural effusion: CTA chest revealed moderate left-sided pleural effusion. Had recurrent thoracocentesis last admission -PCCM recommends deferring thoracocentesis this time until she is safe to hold anticoagulation  Uncontrolled diabetes with hyperglycemia: A1c 7.5% on 01/11/2020. Recent Labs    02/24/19 2052 02/25/19 0600 02/25/19 1122  GLUCAP 124* 82 119*  -Discharged on home medications  Primary malignant neuroendocrine tumor of pancreas s/p pancreaticoduodenectomy and hepaticojejunostomy by Dr. Marcello Moores in 01/2019. Intra-abdominal fluid collections-CTA showed improvement Pancreatic insufficiency -General surgery following             -Continue percutaneous drains             -Continue creon and  supplements.  Megace discontinued due to increased risk of VTE             -Continue Augmentin from prior Whipple  surgery             -Continue supportive care with antiemetics, Imodium and probiotics  Nausea/vomiting:  Improved.  Antiemetics as below.  Essential hypertension: Normotensive. -Continue metoprolol and lisinopril. -Discontinued HCTZ in the setting of hypokalemia  Sinus tachycardia: Likely due to PE.  Resolved. -Metoprolol as above.  Elevated troponin: Likely demand ischemia from heart strain in the setting of PE.  Echo with normal EF. -No further work-up  Normocytic anemia: Hgb 9-10 (baseline)> 12.2 (admit)> 10.1 > 10.4.  Initial value might be falsely elevated from hemoconcentration. FOBT positive but H&H stable and at baseline.  She is also on therapeutic dose anticoagulation which cannot be interrupted at this time.  -Recheck CBC at follow-up.  Hypokalemia/hypomagnesemia: Resolved.  Anxiety/depression: Stable -Continue Klonopin as needed -Added low-dose Remeron which could also help with appetite.  Right foot drop: Chronic -Continue brace and PT  Morbid obesity: BMI 40.75 -Encourage lifestyle change to lose weight once she recovers from acute illness.  Increase nutritional need due to acute illness, pancreatic insufficiency and poor p.o. intake -Nutritional supplements     Discharge Instructions  Discharge Instructions    Call MD for:  difficulty breathing, headache or visual disturbances   Complete by: As directed    Call MD for:  extreme fatigue   Complete by: As directed    Call MD for:  persistant dizziness or light-headedness   Complete by: As directed    Call MD for:  persistant nausea and vomiting   Complete by: As directed    Call MD for:  severe uncontrolled pain   Complete by: As directed    Call MD for:  temperature >100.4   Complete by: As directed    Diet - low sodium heart healthy   Complete by: As directed    Diet Carb Modified   Complete by: As directed    Discharge instructions   Complete by: As directed    It has been a pleasure  taking care of you! You were hospitalized and treated for blood clot in your lungs and your legs.  We are discharging you on blood thinners.  It is very important that you consistently take your blood thinner as prescribed.  There are also some other new medications or changes to your home medication during this hospitalization. Please review your new medication list and the directions before you take your medications.  Please follow-up with your primary care doctor in 1 to 2 weeks.  We also recommend follow-up with your surgeon and interventional radiology as previously recommended.   Take care,   Increase activity slowly   Complete by: As directed      Allergies as of 02/25/2019      Reactions   Ciprofloxacin Hives      Medication List    STOP taking these medications   blood glucose meter kit and supplies Kit   Cryselle-28 0.3-30 MG-MCG tablet Generic drug: norgestrel-ethinyl estradiol   Insulin Syringes (Disposable) U-100 1 ML Misc   lisinopril-hydrochlorothiazide 20-12.5 MG tablet Commonly known as: ZESTORETIC   megestrol 400 MG/10ML suspension Commonly known as: MEGACE   pantoprazole 40 MG tablet Commonly known as: PROTONIX     TAKE these medications   acetaminophen 325 MG tablet Commonly known as: TYLENOL Take 2 tablets (650 mg total) by mouth every  6 (six) hours as needed for mild pain (or temp > 100).   amoxicillin-clavulanate 875-125 MG tablet Commonly known as: AUGMENTIN Take 1 tablet by mouth 2 (two) times daily. 14 day starting on 02/19/2019   apixaban 5 MG Tabs tablet Commonly known as: Eliquis Take 1 tablet (5 mg total) by mouth 2 (two) times daily. Start taking on: March 26, 2019   BIOFREEZE ROLL-ON EX Apply 1 application topically 3 (three) times daily as needed (back pain.).   clonazePAM 0.5 MG tablet Commonly known as: KLONOPIN Take 0.25-0.5 mg by mouth daily as needed for anxiety.   enoxaparin 100 MG/ML injection Commonly known as:  LOVENOX Inject 1 mL (100 mg total) into the skin every 12 (twelve) hours.   feeding supplement (GLUCERNA SHAKE) Liqd Take 237 mLs by mouth 3 (three) times daily between meals.   insulin glargine 100 UNIT/ML injection Commonly known as: LANTUS Inject 0.25 mLs (25 Units total) into the skin 2 (two) times daily.   lipase/protease/amylase 36000 UNITS Cpep capsule Commonly known as: CREON Two to three tabs with meals and 1-2 tabs with snacks.   lisinopril 20 MG tablet Commonly known as: ZESTRIL Take 1 tablet (20 mg total) by mouth daily.   loperamide 2 MG capsule Commonly known as: IMODIUM Take 1 capsule (2 mg total) by mouth as needed for diarrhea or loose stools.   meclizine 12.5 MG tablet Commonly known as: ANTIVERT Take 12.5 mg by mouth 3 (three) times daily as needed for dizziness.   metoCLOPramide 10 MG tablet Commonly known as: REGLAN Take 1 tablet (10 mg total) by mouth 4 (four) times daily -  before meals and at bedtime.   metoprolol succinate 50 MG 24 hr tablet Commonly known as: TOPROL-XL Take 50 mg by mouth at bedtime. Take with or immediately following a meal.   mirtazapine 15 MG tablet Commonly known as: Remeron Take 0.5 tablets (7.5 mg total) by mouth at bedtime.   omeprazole 20 MG capsule Commonly known as: PRILOSEC Take 20 mg by mouth daily.   ondansetron 8 MG tablet Commonly known as: ZOFRAN Take 1 tablet (8 mg total) by mouth every 8 (eight) hours as needed for nausea.   oxyCODONE 5 MG immediate release tablet Commonly known as: Oxy IR/ROXICODONE Take 1-2 tablets (5-10 mg total) by mouth every 6 (six) hours as needed for moderate pain, severe pain or breakthrough pain.   potassium chloride 10 MEQ tablet Commonly known as: KLOR-CON Take 2 tablets (20 mEq total) by mouth 2 (two) times daily.   promethazine 12.5 MG tablet Commonly known as: PHENERGAN Take 12.5 mg by mouth every 6 (six) hours as needed.   Promethegan 25 MG suppository Generic drug:  promethazine Place 25 mg rectally every 6 (six) hours as needed for nausea or vomiting.   rosuvastatin 10 MG tablet Commonly known as: CRESTOR Take 10 mg by mouth at bedtime.   saccharomyces boulardii 250 MG capsule Commonly known as: FLORASTOR Take 1 capsule (250 mg total) by mouth 2 (two) times daily.   sodium chloride flush 0.9 % Soln Commonly known as: NS 5 mLs by Intracatheter route every 8 (eight) hours.   traMADol 50 MG tablet Commonly known as: ULTRAM Take 50 mg by mouth 2 (two) times daily as needed (kidney stone pain).   Vitamin D (Ergocalciferol) 1.25 MG (50000 UNIT) Caps capsule Commonly known as: DRISDOL Take 50,000 Units by mouth every Saturday.       Consultations:  General surgery, PCCM, IR  Procedures/Studies:  2D Echo on 02/21/2019  1. Left ventricular ejection fraction, by visual estimation, is 60 to 65%. The left ventricle has normal function. There is no left ventricular hypertrophy.  2. Definity contrast agent was given IV to delineate the left ventricular endocardial borders.  3. Left ventricular diastolic parameters are consistent with Grade I diastolic dysfunction (impaired relaxation).  4. The left ventricle has no regional wall motion abnormalities.  5. Poorly visualized RV. From the parasternal long axis, the RV appears dilated and hypokinetic though only a small portion of the RV can be seen. The interventricular septum was D-shaped, suggesting RV pressure/volume overload.  6. Left atrial size was normal.  7. Right atrial size was not well visualized.  8. The mitral valve is normal in structure. No evidence of mitral valve regurgitation. No evidence of mitral stenosis.  9. The tricuspid valve is not well visualized. Tricuspid valve regurgitation is not demonstrated. 10. The aortic valve is tricuspid. Aortic valve regurgitation is not visualized. No evidence of aortic valve sclerosis or stenosis. 11. The inferior vena cava is dilated in size with  <50% respiratory variability, suggesting right atrial pressure of 15 mmHg. 12. TR signal is inadequate for assessing pulmonary artery systolic pressure. 13. The interatrial septum was not well visualized.   CT ABDOMEN PELVIS WO CONTRAST  Result Date: 02/15/2019 CLINICAL DATA:  Nausea, dry heaving, diarrhea, pancreatic cancer, status post Whipple, evaluate pancreatic fluid leak EXAM: CT ABDOMEN AND PELVIS WITHOUT CONTRAST TECHNIQUE: Multidetector CT imaging of the abdomen and pelvis was performed following the standard protocol without IV contrast. COMPARISON:  08/09/2018 FINDINGS: Lower chest: Small left pleural effusion and associated atelectasis or consolidation. Hepatobiliary: No focal liver abnormality is seen. Status post cholecystectomy. No biliary dilatation. Pancreas: Postoperative findings of Whipple pancreaticoduodenectomy with pancreatic ductal stent (series 2, image 35). Spleen: Normal in size without significant abnormality. Adrenals/Urinary Tract: Adrenal glands are unremarkable. Small nonobstructive left renal calculi bladder is unremarkable. Stomach/Bowel: Status post Whipple pancreaticoduodenectomy and gastrojejunostomy. Extensive thickening of the colon wall with adjacent fat stranding, which involves the length of the colon to the rectum. Vascular/Lymphatic: Scattered aortic atherosclerosis. No enlarged abdominal or pelvic lymph nodes. Reproductive: No mass or other significant abnormality. Other: Multiple surgical drainage catheters about the left and right abdomen, tips positioned in the vicinity of the duodenectomy site, about the gastric body, and anterior to the pancreatic tail. There is retroperitoneal fluid at the pancreatic head resection site anterior to the IVC with an adjacent drainage catheter, collection measuring approximately 5.4 x 3.0 cm (series 2, image 43). There is loculated appearing air and fluid in the left upper quadrant about the spleen and below the left  hemidiaphragm, largest component of collection measuring approximately 13.1 x 10.3 cm (series 2, image 22). Extensive fat stranding of the omentum and mesenteric fat (series 2, image 48). Musculoskeletal: No acute or significant osseous findings. IMPRESSION: 1. Status post Whipple pancreaticoduodenectomy with pancreatic ductal stent in place. 2. Multiple surgical drainage catheters about the left and right abdomen, tips positioned in the vicinity of the duodenectomy site, about the gastric body, and anterior to the pancreatic tail. 3. There is retroperitoneal fluid at the pancreatic head resection site anterior to the IVC with an adjacent drainage catheter, collection measuring approximately 5.4 x 3.0 cm (series 2, image 43). 4. There is loculated appearing air and fluid in the left upper quadrant about the spleen and below the left hemidiaphragm, largest component of collection measuring approximately 13.1 x 10.3 cm (series 2, image 22).  5. Biliary leak or abscess are differential considerations given these postoperative findings. The presence or absence of infection is not established by CT. 6. Extensive thickening of the colon wall with adjacent fat stranding, which involves the length of the colon to the rectum, consistent with nonspecific infectious, inflammatory, or ischemic colitis. 7. Extensive fat stranding of the omentum and mesenteric fat. 8. Small left pleural effusion and associated atelectasis or consolidation. 9. Small nonobstructive left renal calculi. 10.  Aortic Atherosclerosis (ICD10-I70.0). These results will be called to the ordering clinician or representative by the Radiologist Assistant, and communication documented in the PACS or zVision Dashboard. Electronically Signed   By: Eddie Candle M.D.   On: 02/15/2019 16:04   DG Chest 1 View  Result Date: 01/27/2019 CLINICAL DATA:  Post left-sided thoracentesis. EXAM: CHEST  1 VIEW COMPARISON:  Radiographs 01/26/2019 and 01/24/2019. CT  01/20/2019. FINDINGS: 1128 hours. The left pleural effusion is decreased in volume. There is mildly improved aeration of the left lung base. No pneumothorax. Right arm PICC projects to the superior cavoatrial junction. The heart size and mediastinal contours are stable. IMPRESSION: 1. Decreased left pleural effusion and improved aeration of the left lung base status post thoracentesis. 2. No pneumothorax seen. Electronically Signed   By: Richardean Sale M.D.   On: 01/27/2019 11:46   CT Angio Chest PE W/Cm &/Or Wo Cm  Result Date: 02/20/2019 CLINICAL DATA:  Shortness of breath EXAM: CT ANGIOGRAPHY CHEST WITH CONTRAST TECHNIQUE: Multidetector CT imaging of the chest was performed using the standard protocol during bolus administration of intravenous contrast. Multiplanar CT image reconstructions and MIPs were obtained to evaluate the vascular anatomy. CONTRAST:  1109m OMNIPAQUE IOHEXOL 350 MG/ML SOLN COMPARISON:  January 20, 2019 FINDINGS: Cardiovascular: Contrast injection is sufficient to demonstrate satisfactory opacification of the pulmonary arteries to the segmental level.There are extensive bilateral lobar, segmental, and subsegmental pulmonary emboli. The main pulmonary artery is within normal limits for size. The RV LV ratio measures approximately 1.3, consistent with right-sided heart strain. There is no evidence for a thoracic aortic dissection. The heart size is normal. There is no evidence for significant pericardial effusion. Mediastinum/Nodes: --No mediastinal or hilar lymphadenopathy. --No axillary lymphadenopathy. --No supraclavicular lymphadenopathy. --there is a thyroid nodule involving the isthmus measuring approximately 2.2 cm. --The esophagus is unremarkable Lungs/Pleura: There is a moderate-sized left-sided pleural effusion. There is left lower lobe airspace disease which may represent atelectasis or developing infiltrate. An underlying pulmonary infarct is within the differential given the  patient's extensive pulmonary emboli. There is a mosaic appearance of the lung parenchyma bilaterally likely secondary to the presence of pulmonary emboli. There is an airspace opacity at the right lung base which may represent atelectasis, infiltrate, or developing pulmonary infarct. The trachea is unremarkable. Upper Abdomen: There is a percutaneous drain in the left upper quadrant. There is a small residual collection in this region. There is a relatively stable hypoattenuating area adjacent to the falciform ligament. Musculoskeletal: No chest wall abnormality. No acute or significant osseous findings. Review of the MIP images confirms the above findings. IMPRESSION: 1. Extensive bilateral pulmonary emboli as detailed above with CT evidence for right-sided heart strain. 2. Moderate left-sided pleural effusion. 3. Left lower lobe airspace disease may represent atelectasis, infiltrate, or developing pulmonary infarcts. There are additional airspace opacities at the right lung base with a similar differential diagnosis. 4. Mosaic appearance of the lung parenchyma bilaterally likely secondary to the presence of extensive bilateral pulmonary emboli. 5. Percutaneous drain in the  left upper quadrant with significant interval improvement in the previously demonstrated collection. A small residual collection remains. 6. There is a 2.2 cm thyroid nodule in the isthmus. A follow-up thyroid ultrasound as an outpatient is recommended for further evaluation.(Ref: J Am Coll Radiol. 2015 Feb;12(2): 143-50). These results were called by telephone at the time of interpretation on 02/20/2019 at 8:19 pm to provider Fredia Sorrow , who verbally acknowledged these results. Electronically Signed   By: Constance Holster M.D.   On: 02/20/2019 20:21   IR Catheter Tube Change  Result Date: 02/16/2019 INDICATION: Pancreatic neuroendocrine tumor post resection. Pancreatic leak. Nausea. 13 cm loculated left upper quadrant fluid  collection despite presence of surgical drain. Drain revision requested. EXAM: EXCHANGE OF PERITONEAL DRAIN UNDER FLUOROSCOPY MEDICATIONS: The patient is currently admitted to the hospital and receiving intravenous antibiotics. The antibiotics were administered within an appropriate time frame prior to the initiation of the procedure. ANESTHESIA/SEDATION: Intravenous Fentanyl 156mg and Versed 222mwere administered as conscious sedation during continuous monitoring of the patient's level of consciousness and physiological / cardiorespiratory status by the radiology RN, with a total moderate sedation time of 19 minutes. COMPLICATIONS: None immediate. PROCEDURE: Informed written consent was obtained from the patient after a thorough discussion of the procedural risks, benefits and alternatives. All questions were addressed. Maximal Sterile Barrier Technique was utilized including caps, mask, sterile gowns, sterile gloves, sterile drape, hand hygiene and skin antiseptic. A timeout was performed prior to the initiation of the procedure. The site surrounding the left surgical drain skin entry was prepped with chlorhexidine, draped in usual sterile fashion, infiltrated locally with over 1% lidocaine. Small contrast injection demonstrated communication with the left upper quadrant fluid collection. An angled glidewire was advanced through the surgical drain into the collection. The surgical drain was removed and initially a 16 French pigtail catheter was advanced but its length allowed advancement just to the margin of the collection, which was thought to be unacceptable. Therefore, this was exchanged for a long 12 French pigtail drain catheter, well formed centrally within the collection. Small contrast injection confirmed appropriate positioning. Nearly 400 mL of greenish opaque fluid were removed. The catheter was then secured externally with 0 Prolene suture and StatLock and placed to gravity drainage. The patient  tolerated the procedure well. IMPRESSION: 1. Technically successful exchange and repositioning of left upper quadrant percutaneous drain catheter. Electronically Signed   By: D Lucrezia Europe.D.   On: 02/16/2019 15:01   DG CHEST PORT 1 VIEW  Result Date: 02/22/2019 CLINICAL DATA:  Shortness of breath.  Abdominal drainage catheter. EXAM: PORTABLE CHEST 1 VIEW COMPARISON:  CT 02/20/2019.  Chest x-ray 02/16/2019. FINDINGS: Heart size stable. Persistent left base atelectasis and infiltrate. Persistent small left pleural effusion. No pneumothorax. Stable position left upper quadrant drainage catheter. IMPRESSION: 1. Persistent left base atelectasis and infiltrate. Persistent small left pleural effusion. No interim change. 2.  Stable position of left upper quadrant drainage catheter. Electronically Signed   By: ThMarcello MooresRegister   On: 02/22/2019 08:13   DG Chest Port 1 View  Result Date: 02/20/2019 CLINICAL DATA:  Shortness of breath. EXAM: PORTABLE CHEST 1 VIEW COMPARISON:  February 16, 2019 FINDINGS: Mild infiltrate is seen within the left lung base. There is no evidence of a pleural effusion or pneumothorax. The heart size and mediastinal contours are within normal limits. The visualized skeletal structures are unremarkable. IMPRESSION: 1. Mild left basilar infiltrate. Electronically Signed   By: ThVirgina Norfolk.D.   On: 02/20/2019  18:22   DG Chest Port 1 View  Result Date: 02/16/2019 CLINICAL DATA:  Tachycardia. EXAM: PORTABLE CHEST 1 VIEW COMPARISON:  One-view chest x-ray 01/27/2019 FINDINGS: The heart is enlarged, exaggerated by low lung volumes. Right lung is clear. Left basilar airspace disease is improved. Small left effusion is not excluded. Left upper quadrant drain is noted. IMPRESSION: 1. Improving left basilar airspace disease. 2. Low lung volumes. 3. No acute cardiopulmonary disease. Electronically Signed   By: San Morelle M.D.   On: 02/16/2019 09:27   ECHOCARDIOGRAM COMPLETE  Result  Date: 02/21/2019   ECHOCARDIOGRAM REPORT   Patient Name:   Leah Farmer Date of Exam: 02/21/2019 Medical Rec #:  [id number]         Height:       64.0 in Accession #:    [id number]        Weight:       237.9 lb Date of Birth:  Aug 24, 1965        BSA:          2.11 m Patient Age:    35 years          BP:           135/92 mmHg Patient Gender: F                 HR:           114 bpm. Exam Location:  Inpatient Procedure: 2D Echo, Color Doppler, Cardiac Doppler and Intracardiac            Opacification Agent Indications:     I26.02 Pulmonary embolus  History:         Patient has no prior history of Echocardiogram examinations.                  Risk Factors:Hypertension, Diabetes and Sleep Apnea. COVID+ on                  07/08/18.  Sonographer:     Raquel Sarna Senior RDCS Referring Phys:  [id number] [place] Diagnosing Phys: Loralie Champagne MD  Sonographer Comments: Technically difficult study due to poor echo windows and patient is morbidly obese. IMPRESSIONS  1. Left ventricular ejection fraction, by visual estimation, is 60 to 65%. The left ventricle has normal function. There is no left ventricular hypertrophy.  2. Definity contrast agent was given IV to delineate the left ventricular endocardial borders.  3. Left ventricular diastolic parameters are consistent with Grade I diastolic dysfunction (impaired relaxation).  4. The left ventricle has no regional wall motion abnormalities.  5. Poorly visualized RV. From the parasternal long axis, the RV appears dilated and hypokinetic though only a small portion of the RV can be seen. The interventricular septum was D-shaped, suggesting RV pressure/volume overload.  6. Left atrial size was normal.  7. Right atrial size was not well visualized.  8. The mitral valve is normal in structure. No evidence of mitral valve regurgitation. No evidence of mitral stenosis.  9. The tricuspid valve is not well visualized. Tricuspid valve regurgitation is not demonstrated. 10. The aortic  valve is tricuspid. Aortic valve regurgitation is not visualized. No evidence of aortic valve sclerosis or stenosis. 11. The inferior vena cava is dilated in size with <50% respiratory variability, suggesting right atrial pressure of 15 mmHg. 12. TR signal is inadequate for assessing pulmonary artery systolic pressure. 13. The interatrial septum was not well visualized. FINDINGS  Left Ventricle: Left ventricular ejection fraction, by visual estimation,  is 60 to 65%. The left ventricle has normal function. Definity contrast agent was given IV to delineate the left ventricular endocardial borders. The left ventricle has no regional wall motion abnormalities. The left ventricular internal cavity size was the left ventricle is normal in size. There is no left ventricular hypertrophy. Left ventricular diastolic parameters are consistent with Grade I diastolic dysfunction (impaired relaxation). Right Ventricle: The right ventricular size is not well visualized. Right vetricular wall thickness was not assessed. Global RV systolic function is was not well visualized. Left Atrium: Left atrial size was normal in size. Right Atrium: Right atrial size was not well visualized Pericardium: There is no evidence of pericardial effusion. Mitral Valve: The mitral valve is normal in structure. No evidence of mitral valve regurgitation. No evidence of mitral valve stenosis by observation. Tricuspid Valve: The tricuspid valve is not well visualized. Tricuspid valve regurgitation is not demonstrated. Aortic Valve: The aortic valve is tricuspid. Aortic valve regurgitation is not visualized. The aortic valve is structurally normal, with no evidence of sclerosis or stenosis. Pulmonic Valve: The pulmonic valve was not well visualized. Pulmonic valve regurgitation is not visualized. Pulmonic regurgitation is not visualized. Aorta: The aortic root is normal in size and structure. Venous: The inferior vena cava is dilated in size with less than  50% respiratory variability, suggesting right atrial pressure of 15 mmHg. IAS/Shunts: The interatrial septum was not well visualized.  LEFT VENTRICLE PLAX 2D LVIDd:         4.20 cm  Diastology LVIDs:         2.90 cm  LV e' lateral:   7.51 cm/s LV PW:         1.10 cm  LV E/e' lateral: 8.7 LV IVS:        1.00 cm  LV e' medial:    6.85 cm/s LVOT diam:     2.10 cm  LV E/e' medial:  9.5 LV SV:         46 ml LV SV Index:   20.48 LVOT Area:     3.46 cm  RIGHT VENTRICLE RV S prime:     9.03 cm/s TAPSE (M-mode): 1.3 cm LEFT ATRIUM             Index LA diam:        2.70 cm 1.28 cm/m LA Vol (A2C):   41.9 ml 19.90 ml/m LA Vol (A4C):   24.6 ml 11.68 ml/m LA Biplane Vol: 34.7 ml 16.48 ml/m  AORTIC VALVE LVOT Vmax:   73.20 cm/s LVOT Vmean:  50.300 cm/s LVOT VTI:    0.113 m  AORTA Ao Root diam: 3.20 cm Ao Asc diam:  3.30 cm MITRAL VALVE MV Area (PHT): 3.68 cm             SHUNTS MV PHT:        59.74 msec           Systemic VTI:  0.11 m MV Decel Time: 206 msec             Systemic Diam: 2.10 cm MV E velocity: 65.00 cm/s 103 cm/s MV A velocity: 88.30 cm/s 70.3 cm/s MV E/A ratio:  0.74       1.5  Loralie Champagne MD Electronically signed by Loralie Champagne MD Signature Date/Time: 02/21/2019/5:17:45 PM    Final (Updated)    VAS [place] LOWER EXTREMITY VENOUS (DVT)  Result Date: 02/21/2019  Lower Venous Study Indications: Pain, Swelling, and Palpable Cord.  Risk Factors: Confirmed PE. Anticoagulation:  Heparin. Comparison Study: No prior study. Performing Technologist: Baldwin Crown RDMS, RVT  Examination Guidelines: A complete evaluation includes B-mode imaging, spectral Doppler, color Doppler, and power Doppler as needed of all accessible portions of each vessel. Bilateral testing is considered an integral part of a complete examination. Limited examinations for reoccurring indications may be performed as noted.  +---------+---------------+---------+-----------+----------+-------------------+  RIGHT      Compressibility Phasicity Spontaneity Properties Thrombus Aging       +---------+---------------+---------+-----------+----------+-------------------+  CFV       Full            Yes       Yes                                         +---------+---------------+---------+-----------+----------+-------------------+  SFJ       Full                                                                  +---------+---------------+---------+-----------+----------+-------------------+  FV Prox   Full                                                                  +---------+---------------+---------+-----------+----------+-------------------+  FV Mid    Full                                                                  +---------+---------------+---------+-----------+----------+-------------------+  FV Distal Full                                                                  +---------+---------------+---------+-----------+----------+-------------------+  POP       None            No        No                     Acute                +---------+---------------+---------+-----------+----------+-------------------+  PTV       Full                                             visualized with  color flow           +---------+---------------+---------+-----------+----------+-------------------+  PERO      None                                                                  +---------+---------------+---------+-----------+----------+-------------------+   +---------+---------------+---------+-----------+----------+--------------+  LEFT      Compressibility Phasicity Spontaneity Properties Thrombus Aging  +---------+---------------+---------+-----------+----------+--------------+  CFV       Full            Yes       Yes                                    +---------+---------------+---------+-----------+----------+--------------+  SFJ       Full                                                              +---------+---------------+---------+-----------+----------+--------------+  FV Prox   Full                                                             +---------+---------------+---------+-----------+----------+--------------+  FV Mid    Full                                                             +---------+---------------+---------+-----------+----------+--------------+  FV Distal Partial                                                          +---------+---------------+---------+-----------+----------+--------------+  PFV       Full                                                             +---------+---------------+---------+-----------+----------+--------------+  POP       None            No        No                     Acute           +---------+---------------+---------+-----------+----------+--------------+  PTV       None                                                             +---------+---------------+---------+-----------+----------+--------------+  PERO      None                                                             +---------+---------------+---------+-----------+----------+--------------+ Poorly visualized bilateral calf veins.    Summary: Right: Findings consistent with acute deep vein thrombosis involving the right popliteal vein, and right peroneal veins. No cystic structure found in the popliteal fossa. Left: Findings consistent with acute deep vein thrombosis involving the left distal femoral vein, left popliteal vein, left posterior tibial veins, and left peroneal veins. No cystic structure found in the popliteal fossa.  *See table(s) above for measurements and observations. Electronically signed by Monica Martinez MD on 02/21/2019 at 5:02:51 PM.    Final    US THORACENTESIS ASP PLEURAL SPACE W/IMG GUIDE  Result Date: 01/27/2019 INDICATION: Patient with history of pancreatic tumor, acute hypoxic respiratory failure, possible pneumonia  and small left-sided pleural effusion. Request to IR for diagnostic and therapeutic thoracentesis. EXAM: ULTRASOUND GUIDED LEFT THORACENTESIS MEDICATIONS: 10 mL 1% lidocaine COMPLICATIONS: None immediate. PROCEDURE: An ultrasound guided thoracentesis was thoroughly discussed with the patient and questions answered. The benefits, risks, alternatives and complications were also discussed. The patient understands and wishes to proceed with the procedure. Written consent was obtained. Ultrasound was performed to localize and mark an adequate pocket of fluid in the left chest. The area was then prepped and draped in the normal sterile fashion. 1% Lidocaine was used for local anesthesia. Under ultrasound guidance a 6 Fr Safe-T-Centesis catheter was introduced. Thoracentesis was performed. The catheter was removed and a dressing applied. FINDINGS: A total of approximately 300 mL of clear, yellow fluid was removed. Samples were sent to the laboratory as requested by the clinical team. IMPRESSION: Successful ultrasound guided left thoracentesis yielding 300 mL of pleural fluid. Read by Candiss Norse, PA-C Electronically Signed   By: Aletta Edouard M.D.   On: 01/27/2019 11:15      Discharge Exam: Vitals:   02/24/19 2051 02/25/19 0509  BP: 128/87 134/85  Pulse: (!) 109 95  Resp: 20 18  Temp: 98.7 F (37.1 C) 98.3 F (36.8 C)  SpO2: 91% 92%    GENERAL: No acute distress.  Appears well.  HEENT: MMM.  Vision and hearing grossly intact.  NECK: Supple.  No apparent JVD.  RESP:  No IWOB.  Fair aeration bilaterally. CVS:  RRR. Heart sounds normal.  ABD/GI/GU: Bowel sounds present. Soft. Non tender.  Two abdominal drains on the right and one on the left with a small purulent looking material.  MSK/EXT:  Moves extremities. No apparent deformity or edema.  SKIN: no apparent skin lesion or wound NEURO: Awake, alert and oriented appropriately.  No apparent focal neuro deficit. PSYCH: Calm. Normal affect.     The results of significant diagnostics from this hospitalization (including imaging, microbiology, ancillary and laboratory) are listed below for reference.     Microbiology: Recent Results (from the past 240 hour(s))  SARS CORONAVIRUS 2 (TAT 6-24 HRS) Nasopharyngeal Nasopharyngeal Swab     Status: None   Collection Time: 02/15/19  6:34 PM   Specimen: Nasopharyngeal Swab  Result Value Ref Range Status   SARS Coronavirus 2 NEGATIVE NEGATIVE Final    Comment: (NOTE) SARS-CoV-2 target nucleic acids are NOT DETECTED. The SARS-CoV-2 RNA is generally detectable in  upper and lower respiratory specimens during the acute phase of infection. Negative results do not preclude SARS-CoV-2 infection, do not rule out co-infections with other pathogens, and should not be used as the sole basis for treatment or other patient management decisions. Negative results must be combined with clinical observations, patient history, and epidemiological information. The expected result is Negative. Fact Sheet for Patients: SugarRoll.be Fact Sheet for Healthcare Providers: https://www.woods-mathews.com/ This test is not yet approved or cleared by the [place] FDA and  has been authorized for detection and/or diagnosis of SARS-CoV-2 by FDA under an Emergency Use Authorization (EUA). This EUA will remain  in effect (meaning this test can be used) for the duration of the COVID-19 declaration under Section 56 4(b)(1) of the Act, 21 U.S.C. section 360bbb-3(b)(1), unless the authorization is terminated or revoked sooner. Performed at [facility], [place] [address], [place], [place] [address]   Culture, blood (Routine X 2) w Reflex to ID Panel     Status: None   Collection Time: 02/20/19  8:31 PM   Specimen: BLOOD  Result Value Ref Range Status   Specimen Description BLOOD LEFT ANTECUBITAL  Final   Special Requests   Final    BOTTLES DRAWN AEROBIC AND  ANAEROBIC Blood Culture adequate volume   Culture   Final    NO GROWTH 5 DAYS Performed at [facility], [address] [place], [place] [address]    Report Status 02/25/2019 FINAL  Final  Culture, blood (Routine X 2) w Reflex to ID Panel     Status: None   Collection Time: 02/20/19  8:31 PM   Specimen: BLOOD RIGHT HAND  Result Value Ref Range Status   Specimen Description BLOOD RIGHT HAND  Final   Special Requests   Final    BOTTLES DRAWN AEROBIC AND ANAEROBIC Blood Culture adequate volume   Culture   Final    NO GROWTH 5 DAYS Performed at [facility], [address], [place], [place] [address]    Report Status 02/25/2019 FINAL  Final  SARS CORONAVIRUS 2 (TAT 6-24 HRS) Nasopharyngeal Nasopharyngeal Swab     Status: None   Collection Time: 02/20/19  8:33 PM   Specimen: Nasopharyngeal Swab  Result Value Ref Range Status   SARS Coronavirus 2 NEGATIVE NEGATIVE Final    Comment: (NOTE) SARS-CoV-2 target nucleic acids are NOT DETECTED. The SARS-CoV-2 RNA is generally detectable in upper and lower respiratory specimens during the acute phase of infection. Negative results do not preclude SARS-CoV-2 infection, do not rule out co-infections with other pathogens, and should not be used as the sole basis for treatment or other patient management decisions. Negative results must be combined with clinical observations, patient history, and epidemiological information. The expected result is Negative. Fact Sheet for Patients: SugarRoll.be Fact Sheet for Healthcare Providers: https://www.woods-mathews.com/ This test is not yet approved or cleared by the [place] FDA and  has been authorized for detection and/or diagnosis of SARS-CoV-2 by FDA under an Emergency Use Authorization (EUA). This EUA will remain  in effect (meaning this test can be used) for the duration of the COVID-19 declaration under Section 56 4(b)(1) of the Act, 21  U.S.C. section 360bbb-3(b)(1), unless the authorization is terminated or revoked sooner. Performed at [facility], [place] [address] [place],  [address]      Labs: BNP (last 3 results) Recent Labs    07/08/18 1616 02/20/19 1815  BNP 12.0 536.4*   Basic Metabolic Panel: Recent Labs  Lab 02/20/19 1815 02/20/19 2031 02/21/19 0411 02/22/19 0017 02/23/19 0618 02/24/19 0534 02/25/19 0505  NA   < >  --  141 140 141 144 142  K   < >  --  3.6 3.3* 3.2* 3.2* 3.7  CL   < >  --  109 107 109 110 107  CO2   < >  --  19* 21* '22 23 24  ' GLUCOSE   < >  --  159* 100* 93 81 76  BUN   < >  --  '7 7 9 10 8  ' CREATININE   < >  --  0.88 0.90 0.83 0.84 0.94  CALCIUM   < >  --  8.3* 8.1* 8.4* 8.6* 8.4*  MG  --  1.6* 2.0 1.8  --  1.7 2.2  PHOS  --  3.7 3.8 3.4  --   --   --    < > = values in this interval not displayed.   Liver Function Tests: Recent Labs  Lab 02/20/19 1815 02/21/19 0411 02/22/19 0017 02/25/19 0505  AST 36 '30 28 15  ' ALT 32 '27 25 14  ' ALKPHOS 90 67 69 58  BILITOT 0.7 0.6 0.3 0.2*  PROT 7.6 6.2* 6.4* 5.8*  ALBUMIN 2.8* 2.1* 2.2* 2.0*   Recent Labs  Lab 02/20/19 1815  LIPASE 26   No results for input(s): AMMONIA in the last 168 hours. CBC: Recent Labs  Lab 02/20/19 1815 02/20/19 1815 02/21/19 0411 02/22/19 0017 02/23/19 0618 02/24/19 0534 02/25/19 0505  WBC 16.3*   < > 12.2* 13.3* 10.1 8.5 8.8  NEUTROABS 13.4*  --   --  8.3*  --   --   --   HGB 12.2   < > 11.4* 10.7* 10.1* 10.4* 10.6*  HCT 40.7   < > 38.4 35.4* 33.5* 34.3* 35.3*  MCV 91.1   < > 91.2 90.3 90.8 90.5 90.1  PLT 422*   < > 311 379 328 328 320   < > = values in this interval not displayed.   Cardiac Enzymes: No results for input(s): CKTOTAL, CKMB, CKMBINDEX, TROPONINI in the last 168 hours. BNP: Invalid input(s): POCBNP CBG: Recent Labs  Lab 02/24/19 0618 02/24/19 1233 02/24/19 1705 02/24/19 2052 02/25/19 0600  GLUCAP 84 119* 112* 124* 82   D-Dimer No results for  input(s): DDIMER in the last 72 hours. Hgb A1c No results for input(s): HGBA1C in the last 72 hours. Lipid Profile No results for input(s): CHOL, HDL, LDLCALC, TRIG, CHOLHDL, LDLDIRECT in the last 72 hours. Thyroid function studies No results for input(s): TSH, T4TOTAL, T3FREE, THYROIDAB in the last 72 hours.  Invalid input(s): FREET3 Anemia work up No results for input(s): VITAMINB12, FOLATE, FERRITIN, TIBC, IRON, RETICCTPCT in the last 72 hours. Urinalysis    Component Value Date/Time   COLORURINE YELLOW 01/19/2019 1656   APPEARANCEUR HAZY (A) 01/19/2019 1656   LABSPEC 1.025 01/19/2019 1656   PHURINE 6.0 01/19/2019 1656   GLUCOSEU NEGATIVE 01/19/2019 1656   HGBUR SMALL (A) 01/19/2019 1656   BILIRUBINUR NEGATIVE 01/19/2019 1656   [place] 01/19/2019 1656   PROTEINUR 100 (A) 01/19/2019 1656   NITRITE NEGATIVE 01/19/2019 1656   LEUKOCYTESUR NEGATIVE 01/19/2019 1656   Sepsis Labs Invalid input(s): PROCALCITONIN,  WBC,  LACTICIDVEN    Time coordinating discharge: 45 minutes  SIGNED:  Mercy Riding, MD  [facility] 02/25/2019, 9:50 AM  If 7PM-7AM, please contact night-coverage [email or web address] Password [email or web address]

## 2019-02-25 NOTE — Progress Notes (Signed)
   Subjective/Chief Complaint: Had a bad day yesterday with nausea.    Objective: Vital signs in last 24 hours: Temp:  [98.3 F (36.8 C)-98.7 F (37.1 C)] 98.3 F (36.8 C) (01/25 0509) Pulse Rate:  [95-109] 95 (01/25 0509) Resp:  [18-20] 18 (01/25 0509) BP: (128-134)/(85-87) 134/85 (01/25 0509) SpO2:  [91 %-94 %] 92 % (01/25 0509) Weight:  [108.1 kg] 108.1 kg (01/25 0509) Last BM Date: 02/24/19  Intake/Output from previous day: 01/24 0701 - 01/25 0700 In: 981.7 [P.O.:720; I.V.:6.7; IV Piggyback:255] Out: 835 [Urine:550; Emesis/NG output:100; Drains:185] Intake/Output this shift: No intake/output data recorded.  General appearance: alert, cooperative and no distress GI: soft, non distended.  drains remain purulent. Extremities: +2 edema.  Lab Results:  Recent Labs    02/24/19 0534 02/25/19 0505  WBC 8.5 8.8  HGB 10.4* 10.6*  HCT 34.3* 35.3*  PLT 328 320   BMET Recent Labs    02/24/19 0534 02/25/19 0505  NA 144 142  K 3.2* 3.7  CL 110 107  CO2 23 24  GLUCOSE 81 76  BUN 10 8  CREATININE 0.84 0.94  CALCIUM 8.6* 8.4*   PT/INR No results for input(s): LABPROT, INR in the last 72 hours. ABG No results for input(s): PHART, HCO3 in the last 72 hours.  Invalid input(s): PCO2, PO2  Studies/Results: No results found.  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    add reglan to nausea regimen  Creon as tolerated.  Told patient to try just one with meals/snacks if can tolerate.  Can use imodium if needed for diarrhea, but won't target cause. Continue augmentin glucerna as tolerated.     LOS: 5 days     Stark Klein 02/25/2019

## 2019-02-25 NOTE — TOC Transition Note (Signed)
Transition of Care [facility]) - CM/SW Discharge Note   Patient Details  Name: Leah Farmer MRN: [id number] Date of Birth: [birth year]  [facility] CM/SW Contact:  Zenon Mayo, RN Phone Number: 02/25/2019, 11:09 AM   Clinical Narrative:    Patient is for discharge today, NCM gave patient the eliquis coupon for 30 day free and the 10.00 co pay, also informed patient that if deductible is me than can use the 10 co pay card for refills if not the co pay will be 40.00 .  Also patient states she is active with [facility] for [facility] services. NCM contacted to confirm that she is  And the soc will begin on Wed for HHPT.    Final next level of care: Home w Home Health Services Barriers to Discharge: No Barriers Identified   Patient Goals and CMS Choice   CMS Medicare.gov Compare Post Acute Care list provided to:: Patient Choice offered to / list presented to : Patient  Discharge Placement                       Discharge Plan and Services                DME Arranged: (NA)           HH Agency: [facility] Date HH Agency Contacted: 02/25/19 Time [employer]: 1109 Representative spoke with at [place]: rep  Social Determinants of Health ([place]) Interventions     Readmission Risk Interventions Readmission Risk Prevention Plan 01/31/2019  Transportation Screening Complete  PCP or Specialist Appt within 3-5 Days Complete  HRI or [place] Complete  Social Work Consult for [place] Planning/Counseling Complete  Palliative Care Screening Not Applicable  Medication Review [employer]) Complete  Some recent data might be hidden

## 2019-03-05 ENCOUNTER — Other Ambulatory Visit: Payer: BC Managed Care – PPO

## 2019-03-05 ENCOUNTER — Inpatient Hospital Stay: Admission: RE | Admit: 2019-03-05 | Payer: BC Managed Care – PPO | Source: Ambulatory Visit

## 2019-03-06 ENCOUNTER — Telehealth: Payer: Self-pay

## 2019-03-06 NOTE — Telephone Encounter (Signed)
Patient has been scheduled to see Dr. Elsworth Soho on 03/27/2019. Nothing further needed at this time.

## 2019-03-06 NOTE — Telephone Encounter (Signed)
-----   Message from Amado Coe, RN sent at 02/28/2019  2:29 PM EST ----- Regarding: RE: post-hosp follow up at [facility] I do not believe there is a list, they can be seen in office here with one of the providers going out to [place], and then follow up out there. Right now maybe March could be open clinic date not sure on that yet though.   Let me know if you want me to get her in with a provider out there, [facility] Dad as of right now. ----- Message ----- From: Randa Spike, CMA Sent: 02/25/2019   8:05 AM EST To: Amado Coe, RN, Julian Hy, DO Subject: RE: post-hosp follow up at [facility]                  I will send this staff message to [place]. ----- Message ----- From: Julian Hy, DO Sent: 02/22/2019   8:06 PM EST To: Randa Spike, CMA Subject: RE: post-hosp follow up at [facility]                  Is there a list we can keep her on to keep track of her?  Thanks!  LPC ----- Message ----- From: Randa Spike, CMA Sent: 02/22/2019   1:14 PM EST To: Julian Hy, DO Subject: RE: post-hosp follow up at [facility]                  I don't think the schedule has been made for the [facility] office yet. ----- Message ----- From: Julian Hy, DO Sent: 02/22/2019   9:52 AM EST To: [facility] Triage Pool Subject: post-hosp follow up at [facility]                      Can someone please help set up post-hospital follow up in about a month with [facility]? This woman lives in [place].  Thanks!  LPC

## 2019-03-14 ENCOUNTER — Ambulatory Visit
Admission: RE | Admit: 2019-03-14 | Discharge: 2019-03-14 | Disposition: A | Discharge status: Home / Self Care | Payer: BC Managed Care – PPO | Source: Ambulatory Visit | Attending: General Surgery | Admitting: General Surgery

## 2019-03-14 ENCOUNTER — Ambulatory Visit
Admission: RE | Admit: 2019-03-14 | Discharge: 2019-03-14 | Disposition: A | Discharge status: Home / Self Care | Payer: BC Managed Care – PPO | Source: Ambulatory Visit | Attending: Student | Admitting: Student

## 2019-03-14 DIAGNOSIS — K651 Peritoneal abscess: Secondary | ICD-10-CM

## 2019-03-14 HISTORY — PX: IR RADIOLOGIST EVAL & MGMT: IMG5224

## 2019-03-14 MED ORDER — IOPAMIDOL (ISOVUE-300) INJECTION 61%
125.0000 mL | Freq: Once | INTRAVENOUS | Status: AC | PRN
  Administered 2019-03-14: 14:00:00 125 mL via INTRAVENOUS

## 2019-03-14 NOTE — Progress Notes (Signed)
Referring Physician(s): Byerly,F  Chief Complaint: The patient is seen in follow up today s/p image guided exchange and repositioning of left upper quadrant abdominal drain on 02/16/2019.  History of present illness: Mrs. Gladish is a 54 year old female with history of diabetes, COVID-19 in June of last year, neuroendocrine tumor of the pancreas, status post Whipple procedure with distal pancreatic resection/surgical drain placements x3 in December of last year, NASH, anxiety, depression, GERD and osteoarthritis who was also recently found to have bilateral PE/DVT/pleural effusion.  Due to persistent loculated left upper quadrant fluid collection despite surgical drain she underwent drain exchange and repositioning on 02/16/2019.  She presents again today for follow-up CT and drain evaluation.  She is flushing her drain once daily with 5 cc sterile normal saline.  Average output from the drain is about 50 cc/day of turbid yellow fluid.  She currently denies fever, headache, chest pain, worsening abdominal pain, back pain, vomiting or abnormal bleeding.  She does have dyspnea with exertion, occasional cough and some intermittent nausea.  She is on Lovenox and Eliquis.  She has completed antibiotic therapy.   Past Medical History:  Diagnosis Date  . Anxiety   . Arthritis 2020   lower back and hip; takes only OTC Motrin  . Depression    mild and situational; not medicated  . Diabetes mellitus without complication ([place])    unsure if type 1 or 2 , dx as an adult   . GERD (gastroesophageal reflux disease)   . History of kidney stones 07/2018   hospitalized at [facility]; passed on her own  . Hypertension   . NAFL (nonalcoholic fatty liver) 0000000   fatty liver seen on CT scan abd/ pelvis  . Nephrolithiasis    lov in june 2020 , also had covid at this time   . pancreatic ca dx'd 08/2018   pancreatic  . Pneumonia   . PONV (postoperative nausea and vomiting)   . Tachycardia    generally runs in  the 120s    Past Surgical History:  Procedure Laterality Date  . APPENDECTOMY  in 20s  . CHOLECYSTECTOMY  in 20s  . ESOPHAGOGASTRODUODENOSCOPY (EGD) WITH PROPOFOL N/A 11/01/2018   Procedure: ESOPHAGOGASTRODUODENOSCOPY (EGD) WITH PROPOFOL;  Surgeon: Milus Banister, MD;  Location: [facility];  Service: Endoscopy;  Laterality: N/A;  . EUS N/A 11/01/2018   Procedure: UPPER ENDOSCOPIC ULTRASOUND (EUS) LINEAR;  Surgeon: Milus Banister, MD;  Location: [facility];  Service: Endoscopy;  Laterality: N/A;  . FINE NEEDLE ASPIRATION N/A 11/01/2018   Procedure: FINE NEEDLE ASPIRATION (FNA) LINEAR;  Surgeon: Milus Banister, MD;  Location: [facility];  Service: Endoscopy;  Laterality: N/A;  . IR CATHETER TUBE CHANGE  02/16/2019  . IR THORACENTESIS ASP PLEURAL SPACE W/IMG GUIDE  01/21/2019  . LAPAROSCOPY N/A 01/15/2019   Procedure: LAPAROSCOPY DIAGNOSTIC;  Surgeon: Stark Klein, MD;  Location: [place];  Service: General;  Laterality: N/A;  . WHIPPLE PROCEDURE N/A 01/15/2019   Procedure: WHIPPLE PROCEDURE;  Surgeon: Stark Klein, MD;  Location: [place];  Service: General;  Laterality: N/A;    Allergies: Ciprofloxacin  Medications: Prior to Admission medications   Medication Sig Start Date End Date Taking? Authorizing Provider  acetaminophen (TYLENOL) 325 MG tablet Take 2 tablets (650 mg total) by mouth every 6 (six) hours as needed for mild pain (or temp > 100). 02/19/19   Stark Klein, MD  amoxicillin-clavulanate (AUGMENTIN) 875-125 MG tablet Take 1 tablet by mouth 2 (two) times daily. 14 day starting  on 02/19/2019 02/15/19   [provider]  apixaban (ELIQUIS) 5 MG TABS tablet Take 1 tablet (5 mg total) by mouth 2 (two) times daily. 03/26/19   Mercy Riding, MD  clonazePAM (KLONOPIN) 0.5 MG tablet Take 0.25-0.5 mg by mouth daily as needed for anxiety.    [provider]  enoxaparin (LOVENOX) 100 MG/ML injection Inject 1 mL (100 mg total) into the skin every 12 (twelve) hours.  02/25/19 03/27/19  Mercy Riding, MD  feeding supplement, GLUCERNA SHAKE, (GLUCERNA SHAKE) LIQD Take 237 mLs by mouth 3 (three) times daily between meals. 01/29/19   Stark Klein, MD  insulin glargine (LANTUS) 100 UNIT/ML injection Inject 0.25 mLs (25 Units total) into the skin 2 (two) times daily. 01/30/19   Raiford Noble Latif, DO  lipase/protease/amylase (CREON) 36000 UNITS CPEP capsule Two to three tabs with meals and 1-2 tabs with snacks. 02/22/19   Stark Klein, MD  lisinopril (ZESTRIL) 20 MG tablet Take 1 tablet (20 mg total) by mouth daily. 02/25/19 08/24/19  Mercy Riding, MD  loperamide (IMODIUM) 2 MG capsule Take 1 capsule (2 mg total) by mouth as needed for diarrhea or loose stools. 02/25/19   Stark Klein, MD  meclizine (ANTIVERT) 12.5 MG tablet Take 12.5 mg by mouth 3 (three) times daily as needed for dizziness.    [provider]  Menthol, Topical Analgesic, (BIOFREEZE ROLL-ON EX) Apply 1 application topically 3 (three) times daily as needed (back pain.).    [provider]  metoCLOPramide (REGLAN) 10 MG tablet Take 1 tablet (10 mg total) by mouth 4 (four) times daily -  before meals and at bedtime. 02/25/19   Stark Klein, MD  metoprolol succinate (TOPROL-XL) 50 MG 24 hr tablet Take 50 mg by mouth at bedtime. Take with or immediately following a meal.     [provider]  mirtazapine (REMERON) 15 MG tablet Take 0.5 tablets (7.5 mg total) by mouth at bedtime. 02/25/19 06/25/19  Mercy Riding, MD  omeprazole (PRILOSEC) 20 MG capsule Take 20 mg by mouth daily.    [provider]  ondansetron (ZOFRAN) 8 MG tablet Take 1 tablet (8 mg total) by mouth every 8 (eight) hours as needed for nausea. 02/25/19   Stark Klein, MD  oxyCODONE (OXY IR/ROXICODONE) 5 MG immediate release tablet Take 1-2 tablets (5-10 mg total) by mouth every 6 (six) hours as needed for moderate pain, severe pain or breakthrough pain. 01/19/19   Stark Klein, MD  potassium chloride SA  (KLOR-CON) 10 MEQ tablet Take 2 tablets (20 mEq total) by mouth 2 (two) times daily. 02/19/19   Stark Klein, MD  promethazine (PHENERGAN) 12.5 MG tablet Take 12.5 mg by mouth every 6 (six) hours as needed. 02/13/19   [provider]  PROMETHEGAN 25 MG suppository Place 25 mg rectally every 6 (six) hours as needed for nausea or vomiting.  02/15/19   [provider]  rosuvastatin (CRESTOR) 10 MG tablet Take 10 mg by mouth at bedtime.    [provider]  saccharomyces boulardii (FLORASTOR) 250 MG capsule Take 1 capsule (250 mg total) by mouth 2 (two) times daily. 02/25/19   Mercy Riding, MD  sodium chloride flush (NS) 0.9 % SOLN 5 mLs by Intracatheter route every 8 (eight) hours. 02/19/19   Stark Klein, MD  traMADol (ULTRAM) 50 MG tablet Take 50 mg by mouth 2 (two) times daily as needed (kidney stone pain).  07/21/18   [provider]  Vitamin D, Ergocalciferol, (  DRISDOL) 1.25 MG (50000 UT) CAPS capsule Take 50,000 Units by mouth every Saturday.    [provider]     Family History  Problem Relation Age of Onset  . Hypertension Mother   . Diabetes Mother   . Pancreatic cancer Mother   . Hypertension Father   . Diabetes Father   . Esophageal cancer Maternal Uncle        Passed at 27. Found colon cancer on colonoscopy at that time.   . Colon cancer Neg Hx   . Colon polyps Neg Hx     Social History   Socioeconomic History  . Marital status: Married    Spouse name: Not on file  . Number of children: Not on file  . Years of education: Not on file  . Highest education level: Not on file  Occupational History  . Not on file  Tobacco Use  . Smoking status: Never Smoker  . Smokeless tobacco: Never Used  Substance and Sexual Activity  . Alcohol use: Never  . Drug use: Never  . Sexual activity: Not Currently  Other Topics Concern  . Not on file  Social History Narrative  . Not on file   Social Determinants of Health   Financial Resource  Strain:   . Difficulty of Paying Living Expenses: Not on file  Food Insecurity:   . Worried About Charity fundraiser in the Last Year: Not on file  . Ran Out of Food in the Last Year: Not on file  Transportation Needs:   . Lack of Transportation (Medical): Not on file  . Lack of Transportation (Non-Medical): Not on file  Physical Activity:   . Days of Exercise per Week: Not on file  . Minutes of Exercise per Session: Not on file  Stress:   . Feeling of Stress : Not on file  Social Connections:   . Frequency of Communication with Friends and Family: Not on file  . Frequency of Social Gatherings with Friends and Family: Not on file  . Attends Religious Services: Not on file  . Active Member of Clubs or Organizations: Not on file  . Attends Archivist Meetings: Not on file  . Marital Status: Not on file     Vital Signs: BP 109/87 (BP Location: Right Arm)   Pulse (!) 130   SpO2 97%   Physical Exam awake, alert.  Chest with distant breath sounds bilaterally.  Heart with tachycardic but regular rhythm ;abdomen obese, soft, positive bowel sounds, 2 right lat abd surgical drains in place with minimal output, left upper quadrant IR drain intact, insertion site okay, nontender to palpation, approximately 30 cc of turbid yellow fluid in gravity bag  Imaging: No results found.  Labs:  CBC: Recent Labs    02/22/19 0017 02/23/19 0618 02/24/19 0534 02/25/19 0505  WBC 13.3* 10.1 8.5 8.8  HGB 10.7* 10.1* 10.4* 10.6*  HCT 35.4* 33.5* 34.3* 35.3*  PLT 379 328 328 320    COAGS: Recent Labs    07/09/18 1037 01/11/19 1109 01/16/19 0610 02/15/19 1855  INR 1.0 0.9 1.3* 1.3*    BMP: Recent Labs    02/22/19 0017 02/23/19 0618 02/24/19 0534 02/25/19 0505  NA 140 141 144 142  K 3.3* 3.2* 3.2* 3.7  CL 107 109 110 107  CO2 21* 22 23 24   GLUCOSE 100* 93 81 76  BUN 7 9 10 8   CALCIUM 8.1* 8.4* 8.6* 8.4*  CREATININE 0.90 0.83 0.84 0.94  GFRNONAA >60 >  60 >60 >60    GFRAA >60 >60 >60 >60    LIVER FUNCTION TESTS: Recent Labs    02/20/19 1815 02/21/19 0411 02/22/19 0017 02/25/19 0505  BILITOT 0.7 0.6 0.3 0.2*  AST 36 30 28 15   ALT 32 27 25 14   ALKPHOS 90 67 69 58  PROT 7.6 6.2* 6.4* 5.8*  ALBUMIN 2.8* 2.1* 2.2* 2.0*    Assessment: 54 year old female with history of diabetes, COVID-19 in June of last year, neuroendocrine tumor of the pancreas, status post Whipple procedure with distal pancreatic resection/surgical drain placements x3 in December of last year, NASH, anxiety, depression, GERD and osteoarthritis who was also recently found to have bilateral PE/DVT/pleural effusion.  Due to persistent loculated left upper quadrant fluid collection despite surgical drain she underwent drain exchange and repositioning on 02/16/2019.  She presents again today for follow-up CT and drain evaluation.  She is flushing her drain once daily with 5 cc sterile normal saline.  Average output from the drain is about 50 cc/day of turbid yellow fluid.  She currently denies fever, headache, chest pain, worsening abdominal pain, back pain, vomiting or abnormal bleeding.  She does have dyspnea with exertion, occasional cough and some intermittent nausea.  She is on Lovenox and Eliquis.  She has completed antibiotic therapy.  Preliminary findings from CT A/P today shows significant decrease in size of left upper quadrant fluid collection and resolution of right abdominal fluid collections at surgical drain sites.  Imaging studies were reviewed by Dr. Pascal Lux. Due to significant output from left upper quadrant drain it will remain in place; patient was offered removal of the existing right surgical drains, however she does not wish to pursue removal at this time.  She is scheduled for follow-up with Dr. Barry Dienes on March 1 at which time they can discuss removal of all drains if outputs are negligible.  Patient will maintain current drain care/output monitoring and dressing  changes.   Signed: D. Rowe Robert, PA-C 03/14/2019, 2:46 PM   Please refer to Dr. Deniece Portela attestation of this note for management and plan    Patient ID: Leah Farmer, female   DOB: [birth year], 54 y.o.   MRN: [id number]

## 2019-03-20 ENCOUNTER — Encounter: Payer: Self-pay | Admitting: Neurology

## 2019-03-25 ENCOUNTER — Telehealth: Payer: Self-pay | Admitting: Critical Care Medicine

## 2019-03-25 NOTE — Telephone Encounter (Signed)
Spoke with patient's husband. He stated that she has been doing well since being home from the hospital. She is scheduled to have the drain removed on 04/01/19. Patient lives in [place] and is not able to tolerate long car rides. She has been dealing with some increase nausea this week. She is scheduled to see RA on 03/27/19 at [place] would like to know if we could convert her visit over to a video visit or televisit. He is capable of checking her vitals at home the day of the visit. I did advise him that since she has never been seen in our office before, I would need to check with the provider. He verbalized understanding.   RA, please advise. Thanks!

## 2019-03-26 NOTE — Telephone Encounter (Signed)
She saw Dr. Carlis Abbott in the hospital. New patient for me so I do not think I can do televisit  Okay to defer her follow-up visit for 2 to 4 weeks until she can come into the office or arrange televisit with Dr. Carlis Abbott

## 2019-03-26 NOTE — Telephone Encounter (Signed)
Spoke with the pt's spouse and have scheduled appt with Dr Carlis Abbott for 04/15/2019

## 2019-03-27 ENCOUNTER — Ambulatory Visit: Payer: BC Managed Care – PPO | Admitting: Pulmonary Disease

## 2019-04-15 ENCOUNTER — Ambulatory Visit (INDEPENDENT_AMBULATORY_CARE_PROVIDER_SITE_OTHER): Payer: BC Managed Care – PPO | Admitting: Critical Care Medicine

## 2019-04-15 ENCOUNTER — Other Ambulatory Visit: Payer: Self-pay

## 2019-04-15 ENCOUNTER — Encounter: Payer: Self-pay | Admitting: Critical Care Medicine

## 2019-04-15 DIAGNOSIS — Z86711 Personal history of pulmonary embolism: Secondary | ICD-10-CM

## 2019-04-15 DIAGNOSIS — Z7901 Long term (current) use of anticoagulants: Secondary | ICD-10-CM | POA: Diagnosis not present

## 2019-04-15 DIAGNOSIS — Z8679 Personal history of other diseases of the circulatory system: Secondary | ICD-10-CM

## 2019-04-15 MED ORDER — APIXABAN 5 MG PO TABS: 5.0000 mg | ORAL_TABLET | Freq: Two times a day (BID) | ORAL | 4 refills | Status: DC

## 2019-04-15 NOTE — Progress Notes (Signed)
Synopsis: Referred in January 2021 for submassive PE by Margy Clarks, NP.  Subjective:   PATIENT ID: Leah Farmer GENDER: female DOB: [birth year], MRN: [id number]  Chief Complaint  Patient presents with  . Hospitalization Follow-up    Whipple procedure Surgery on Dec 15. Patient states that she has more good days than bad. States that she has a lot of nausea everyday and some days are more severe. Patient is on Eliquis and doing good. Patient's shortness of breath is much better.    Virtual Visit via Telephone Note  I connected with Leah Farmer on 04/15/19 at 11:00 AM EDT by telephone and verified that I am speaking with the correct person using two identifiers.  Location: Patient: home Provider: [facility]- [id number] [place], [place], [place] [address]   I discussed the limitations, risks, security and privacy concerns of performing an evaluation and management service by telephone and the availability of in person appointments. I also discussed with the patient that there may be a patient responsible charge related to this service. The patient expressed understanding and agreed to proceed.    I discussed the assessment and treatment plan with the patient. The patient was provided an opportunity to ask questions and all were answered. The patient agreed with the plan and demonstrated an understanding of the instructions.   The patient was advised to call back or seek an in-person evaluation if the symptoms worsen or if the condition fails to improve as anticipated.  I provided 22 minutes of non-face-to-face time during this encounter.       Leah Farmer is a 54 year old woman with a history of acute PE with right heart strain in January 2021.  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  She had complication of intra-abdominal abscesses.  She was hospitalized for 5 days, and was discharged on 1 month of  Lovenox prior to switching to Eliquis twice daily.  Since her hospitalization her breathing has improved.  She only has shortness of breath while showering, which she relates more to anxiety associated with her abdominal drains.  No dyspnea on exertion.  No bleeding issues with anticoagulation.  She has ongoing nausea which has been a problem since surgery, which her surgeon is managing.  Her nausea improved being off Lovenox.  She has persistent foot drop since her surgery, has completed PT, and will be going to see a neurologist.  Recent follow-up with radiology on 03/14/2019, Darrell Allred, PA-C-had reduced size pleural effusion and reduced intra-abdominal drains.  Leah Farmer saw her surgeon last week, who pulled 2 out of the 3 drains.       Past Medical History:  Diagnosis Date  . Anxiety   . Arthritis 2020   lower back and hip; takes only OTC Motrin  . Depression    mild and situational; not medicated  . Diabetes mellitus without complication ([place])    unsure if type 1 or 2 , dx as an adult   . GERD (gastroesophageal reflux disease)   . History of kidney stones 07/2018   hospitalized at [facility]; passed on her own  . Hypertension   . NAFL (nonalcoholic fatty liver) 0000000   fatty liver seen on CT scan abd/ pelvis  . Nephrolithiasis    lov in june 2020 , also had covid at this time   . pancreatic ca dx'd 08/2018   pancreatic  . Pneumonia   . PONV (postoperative nausea and vomiting)   .  Tachycardia    generally runs in the 120s     Family History  Problem Relation Age of Onset  . Hypertension Mother   . Diabetes Mother   . Pancreatic cancer Mother   . Hypertension Father   . Diabetes Father   . Esophageal cancer Maternal Uncle        Passed at 57. Found colon cancer on colonoscopy at that time.   . Colon cancer Neg Hx   . Colon polyps Neg Hx      Past Surgical History:  Procedure Laterality Date  . APPENDECTOMY  in 20s  . CHOLECYSTECTOMY  in 20s  .  ESOPHAGOGASTRODUODENOSCOPY (EGD) WITH PROPOFOL N/A 11/01/2018   Procedure: ESOPHAGOGASTRODUODENOSCOPY (EGD) WITH PROPOFOL;  Surgeon: Milus Banister, MD;  Location: [facility];  Service: Endoscopy;  Laterality: N/A;  . EUS N/A 11/01/2018   Procedure: UPPER ENDOSCOPIC ULTRASOUND (EUS) LINEAR;  Surgeon: Milus Banister, MD;  Location: [facility];  Service: Endoscopy;  Laterality: N/A;  . FINE NEEDLE ASPIRATION N/A 11/01/2018   Procedure: FINE NEEDLE ASPIRATION (FNA) LINEAR;  Surgeon: Milus Banister, MD;  Location: [facility];  Service: Endoscopy;  Laterality: N/A;  . IR CATHETER TUBE CHANGE  02/16/2019  . IR RADIOLOGIST EVAL & MGMT  03/14/2019  . IR THORACENTESIS ASP PLEURAL SPACE W/IMG GUIDE  01/21/2019  . LAPAROSCOPY N/A 01/15/2019   Procedure: LAPAROSCOPY DIAGNOSTIC;  Surgeon: Stark Klein, MD;  Location: [facility];  Service: General;  Laterality: N/A;  . WHIPPLE PROCEDURE N/A 01/15/2019   Procedure: WHIPPLE PROCEDURE;  Surgeon: Stark Klein, MD;  Location: [facility];  Service: General;  Laterality: N/A;    Social History   Socioeconomic History  . Marital status: Married    Spouse name: Not on file  . Number of children: Not on file  . Years of education: Not on file  . Highest education level: Not on file  Occupational History  . Not on file  Tobacco Use  . Smoking status: Never Smoker  . Smokeless tobacco: Never Used  Substance and Sexual Activity  . Alcohol use: Never  . Drug use: Never  . Sexual activity: Not Currently  Other Topics Concern  . Not on file  Social History Narrative  . Not on file   Social Determinants of Health   Financial Resource Strain:   . Difficulty of Paying Living Expenses:   Food Insecurity:   . Worried About Charity fundraiser in the Last Year:   . Arboriculturist in the Last Year:   Transportation Needs:   . Film/video editor (Medical):   Marland Kitchen Lack of Transportation (Non-Medical):   Physical Activity:   . Days of Exercise per Week:     . Minutes of Exercise per Session:   Stress:   . Feeling of Stress :   Social Connections:   . Frequency of Communication with Friends and Family:   . Frequency of Social Gatherings with Friends and Family:   . Attends Religious Services:   . Active Member of Clubs or Organizations:   . Attends Archivist Meetings:   Marland Kitchen Marital Status:   Intimate Partner Violence:   . Fear of Current or Ex-Partner:   . Emotionally Abused:   Marland Kitchen Physically Abused:   . Sexually Abused:      Allergies  Allergen Reactions  . Ciprofloxacin Hives      There is no immunization history on file for this patient.  Outpatient Medications Prior to Visit  Medication Sig Dispense Refill  . acetaminophen (TYLENOL) 325 MG tablet Take 2 tablets (650 mg total) by mouth every 6 (six) hours as needed for mild pain (or temp > 100).    Marland Kitchen apixaban (ELIQUIS) 5 MG TABS tablet Take 1 tablet (5 mg total) by mouth 2 (two) times daily. 180 tablet 1  . clonazePAM (KLONOPIN) 0.5 MG tablet Take 0.25-0.5 mg by mouth daily as needed for anxiety.    . feeding supplement, GLUCERNA SHAKE, (GLUCERNA SHAKE) LIQD Take 237 mLs by mouth 3 (three) times daily between meals. 90 mL 0  . insulin glargine (LANTUS) 100 UNIT/ML injection Inject 0.25 mLs (25 Units total) into the skin 2 (two) times daily. 10 mL 3  . lipase/protease/amylase (CREON) 36000 UNITS CPEP capsule Two to three tabs with meals and 1-2 tabs with snacks. 240 capsule 11  . lisinopril (ZESTRIL) 20 MG tablet Take 1 tablet (20 mg total) by mouth daily. 90 tablet 1  . loperamide (IMODIUM) 2 MG capsule Take 1 capsule (2 mg total) by mouth as needed for diarrhea or loose stools. 30 capsule 0  . meclizine (ANTIVERT) 12.5 MG tablet Take 12.5 mg by mouth 3 (three) times daily as needed for dizziness.    . Menthol, Topical Analgesic, (BIOFREEZE ROLL-ON EX) Apply 1 application topically 3 (three) times daily as needed (back pain.).    Marland Kitchen metoCLOPramide (REGLAN) 10 MG tablet  Take 1 tablet (10 mg total) by mouth 4 (four) times daily -  before meals and at bedtime. 120 tablet 3  . metoprolol succinate (TOPROL-XL) 50 MG 24 hr tablet Take 50 mg by mouth at bedtime. Take with or immediately following a meal.     . mirtazapine (REMERON) 15 MG tablet Take 0.5 tablets (7.5 mg total) by mouth at bedtime. 30 tablet 1  . ondansetron (ZOFRAN) 8 MG tablet Take 1 tablet (8 mg total) by mouth every 8 (eight) hours as needed for nausea. 40 tablet 3  . oxyCODONE (OXY IR/ROXICODONE) 5 MG immediate release tablet Take 1-2 tablets (5-10 mg total) by mouth every 6 (six) hours as needed for moderate pain, severe pain or breakthrough pain. 30 tablet 0  . potassium chloride SA (KLOR-CON) 10 MEQ tablet Take 2 tablets (20 mEq total) by mouth 2 (two) times daily. 60 tablet 0  . promethazine (PHENERGAN) 12.5 MG tablet Take 12.5 mg by mouth every 6 (six) hours as needed.    Marland Kitchen PROMETHEGAN 25 MG suppository Place 25 mg rectally every 6 (six) hours as needed for nausea or vomiting.     . rosuvastatin (CRESTOR) 10 MG tablet Take 10 mg by mouth at bedtime.    . saccharomyces boulardii (FLORASTOR) 250 MG capsule Take 1 capsule (250 mg total) by mouth 2 (two) times daily. 30 capsule 0  . sodium chloride flush (NS) 0.9 % SOLN 5 mLs by Intracatheter route every 8 (eight) hours. 90 Syringe 0  . traMADol (ULTRAM) 50 MG tablet Take 50 mg by mouth 2 (two) times daily as needed (kidney stone pain).     . Vitamin D, Ergocalciferol, (DRISDOL) 1.25 MG (50000 UT) CAPS capsule Take 50,000 Units by mouth every Saturday.    Marland Kitchen omeprazole (PRILOSEC) 20 MG capsule Take 20 mg by mouth daily.    Marland Kitchen amoxicillin-clavulanate (AUGMENTIN) 875-125 MG tablet Take 1 tablet by mouth 2 (two) times daily. 14 day starting on 02/19/2019    . enoxaparin (LOVENOX) 100 MG/ML injection Inject 1 mL (100 mg total) into the skin every 12 (twelve)  hours. 60 mL 0   Facility-Administered Medications Prior to Visit  Medication Dose Route Frequency  Provider Last Rate Last Admin  . sodium chloride 0.9 % 1,000 mL with thiamine 123XX123 mg, folic acid 1 mg, multivitamins adult 10 mL infusion   Intravenous Once Stark Klein, MD      . sodium chloride 0.9 % 1,000 mL with thiamine 123XX123 mg, folic acid 1 mg, multivitamins adult 10 mL infusion   Intravenous Once Stark Klein, MD      . sodium chloride 0.9 % bolus 1,000 mL  1,000 mL Intravenous Once Stark Klein, MD      . sodium chloride 0.9 % bolus 1,000 mL  1,000 mL Intravenous Once Stark Klein, MD        Review of Systems  Constitutional: Negative.   Respiratory: Negative for shortness of breath.   Gastrointestinal: Negative for blood in stool.  Genitourinary: Negative for hematuria.  Neurological:       Foot drop     Objective:  There were no vitals filed for this visit.   on  RA BMI Readings from Last 3 Encounters:  02/25/19 40.92 kg/m  02/15/19 39.01 kg/m  01/22/19 44.06 kg/m   Wt Readings from Last 3 Encounters:  02/25/19 238 lb 6.4 oz (108.1 kg)  02/15/19 227 lb 4.7 oz (103.1 kg)  01/22/19 264 lb 12.4 oz (120.1 kg)    Physical Exam - limited due to televisit. No conversational dyspnea. Normal speech, answering questions appropriately.   CBC    Component Value Date/Time   WBC 8.8 02/25/2019 0505   RBC 3.92 02/25/2019 0505   HGB 10.6 (L) 02/25/2019 0505   HCT 35.3 (L) 02/25/2019 0505   PLT 320 02/25/2019 0505   MCV 90.1 02/25/2019 0505   MCH 27.0 02/25/2019 0505   MCHC 30.0 02/25/2019 0505   RDW 15.9 (H) 02/25/2019 0505   LYMPHSABS 3.2 02/22/2019 0017   MONOABS 1.2 (H) 02/22/2019 0017   EOSABS 0.2 02/22/2019 0017   BASOSABS 0.1 02/22/2019 0017    CHEMISTRY No results for input(s): NA, K, CL, CO2, GLUCOSE, BUN, CREATININE, CALCIUM, MG, PHOS in the last 168 hours. CrCl cannot be calculated (Patient's most recent lab result is older than the maximum 21 days allowed.).   Left pleural fluid 01/21/2019: Glucose 220 LD 544 Protein 3.6 WBC 2750 (91%  PMNs)  Left pleural fluid 01/27/2019: WBC 775 (34% PMNs, 38% monos, 26% lymphs)   Chest Imaging- films reviewed: CTA chest 02/20/2019-large bilateral PEs.  Dilated RV and RA with reflux of contrast down IVC.  No significant mediastinal or hilar adenopathy.  Small dependent left pleural effusion with overlying atelectasis.  Upper left abdominal drain in place.  Mild groundglass opacities room part of the major fissure on the right.  Two small, dependent small opacities in the right lower lobe.  02/21/2019 lower extremity ultrasound- Right DVT involving popliteal, peroneal veins. Left DVT involving distal femoral veins, left popliteal, left posterior tibial, left peroneal veins.  CT abdomen pelvis 03/14/2019, lung images reviewed-minimal residual left dependent pleural effusion with overlying atelectasis.  Improvement in right lower lobe dependent opacities, now scar.  Improvement in pulmonary artery clot burden.  Pulmonary Functions Testing Results: No flowsheet data found.   Echocardiogram 02/21/2019: LVEF 60 to [id number], grade 1 diastolic dysfunction.  Normal LA.  Dilated RV with hypokinesis, D-shaped on short axis view.  Normal valves.  Dilated IVC with reduced respiratory variability.      Assessment & Plan:     ICD-10-CM  1. History of pulmonary embolus (PE)  Z86.711   2. H/O acute cor pulmonale  Z86.79   3. Chronic anticoagulation  Z79.01     History of PE with cor pulmonale. Doing well on chronic Eliquis. -Con't apixaban twice daily to complete 6 months of anticoagulation -Repeat echocardiogram to ensure resolution of right heart strain -Notify us if there are bleeding complications  RTC in 4 months or sooner as needed.    Current Outpatient Medications:  .  acetaminophen (TYLENOL) 325 MG tablet, Take 2 tablets (650 mg total) by mouth every 6 (six) hours as needed for mild pain (or temp > 100)., Disp:  , Rfl:  .  apixaban (ELIQUIS) 5 MG TABS tablet, Take 1 tablet (5 mg total) by  mouth 2 (two) times daily., Disp: 180 tablet, Rfl: 1 .  clonazePAM (KLONOPIN) 0.5 MG tablet, Take 0.25-0.5 mg by mouth daily as needed for anxiety., Disp: , Rfl:  .  feeding supplement, GLUCERNA SHAKE, (GLUCERNA SHAKE) LIQD, Take 237 mLs by mouth 3 (three) times daily between meals., Disp: 90 mL, Rfl: 0 .  insulin glargine (LANTUS) 100 UNIT/ML injection, Inject 0.25 mLs (25 Units total) into the skin 2 (two) times daily., Disp: 10 mL, Rfl: 3 .  lipase/protease/amylase (CREON) 36000 UNITS CPEP capsule, Two to three tabs with meals and 1-2 tabs with snacks., Disp: 240 capsule, Rfl: 11 .  lisinopril (ZESTRIL) 20 MG tablet, Take 1 tablet (20 mg total) by mouth daily., Disp: 90 tablet, Rfl: 1 .  loperamide (IMODIUM) 2 MG capsule, Take 1 capsule (2 mg total) by mouth as needed for diarrhea or loose stools., Disp: 30 capsule, Rfl: 0 .  meclizine (ANTIVERT) 12.5 MG tablet, Take 12.5 mg by mouth 3 (three) times daily as needed for dizziness., Disp: , Rfl:  .  Menthol, Topical Analgesic, (BIOFREEZE ROLL-ON EX), Apply 1 application topically 3 (three) times daily as needed (back pain.)., Disp: , Rfl:  .  metoCLOPramide (REGLAN) 10 MG tablet, Take 1 tablet (10 mg total) by mouth 4 (four) times daily -  before meals and at bedtime., Disp: 120 tablet, Rfl: 3 .  metoprolol succinate (TOPROL-XL) 50 MG 24 hr tablet, Take 50 mg by mouth at bedtime. Take with or immediately following a meal. , Disp: , Rfl:  .  mirtazapine (REMERON) 15 MG tablet, Take 0.5 tablets (7.5 mg total) by mouth at bedtime., Disp: 30 tablet, Rfl: 1 .  ondansetron (ZOFRAN) 8 MG tablet, Take 1 tablet (8 mg total) by mouth every 8 (eight) hours as needed for nausea., Disp: 40 tablet, Rfl: 3 .  oxyCODONE (OXY IR/ROXICODONE) 5 MG immediate release tablet, Take 1-2 tablets (5-10 mg total) by mouth every 6 (six) hours as needed for moderate pain, severe pain or breakthrough pain., Disp: 30 tablet, Rfl: 0 .  potassium chloride SA (KLOR-CON) 10 MEQ tablet,  Take 2 tablets (20 mEq total) by mouth 2 (two) times daily., Disp: 60 tablet, Rfl: 0 .  promethazine (PHENERGAN) 12.5 MG tablet, Take 12.5 mg by mouth every 6 (six) hours as needed., Disp: , Rfl:  .  PROMETHEGAN 25 MG suppository, Place 25 mg rectally every 6 (six) hours as needed for nausea or vomiting. , Disp: , Rfl:  .  rosuvastatin (CRESTOR) 10 MG tablet, Take 10 mg by mouth at bedtime., Disp: , Rfl:  .  saccharomyces boulardii (FLORASTOR) 250 MG capsule, Take 1 capsule (250 mg total) by mouth 2 (two) times daily., Disp: 30 capsule, Rfl: 0 .  sodium chloride  flush (NS) 0.9 % SOLN, 5 mLs by Intracatheter route every 8 (eight) hours., Disp: 90 Syringe, Rfl: 0 .  traMADol (ULTRAM) 50 MG tablet, Take 50 mg by mouth 2 (two) times daily as needed (kidney stone pain). , Disp: , Rfl:  .  Vitamin D, Ergocalciferol, (DRISDOL) 1.25 MG (50000 UT) CAPS capsule, Take 50,000 Units by mouth every Saturday., Disp: , Rfl:  .  omeprazole (PRILOSEC) 20 MG capsule, Take 20 mg by mouth daily., Disp: , Rfl:   Current Facility-Administered Medications:  .  sodium chloride 0.9 % 1,000 mL with thiamine 123XX123 mg, folic acid 1 mg, multivitamins adult 10 mL infusion, , Intravenous, Once, Stark Klein, MD .  sodium chloride 0.9 % 1,000 mL with thiamine 123XX123 mg, folic acid 1 mg, multivitamins adult 10 mL infusion, , Intravenous, Once, Stark Klein, MD .  sodium chloride 0.9 % bolus 1,000 mL, 1,000 mL, Intravenous, Once, Stark Klein, MD .  sodium chloride 0.9 % bolus 1,000 mL, 1,000 mL, Intravenous, Once, Stark Klein, MD    Julian Hy, DO [facility] 04/15/2019 11:22 AM

## 2019-04-15 NOTE — Patient Instructions (Addendum)
Thank you for visiting Dr. Carlis Abbott at [facility]. We recommend the following: Orders Placed This Encounter  Procedures  . ECHOCARDIOGRAM COMPLETE   Orders Placed This Encounter  Procedures  . ECHOCARDIOGRAM COMPLETE    Standing Status:   Future    Standing Expiration Date:   07/15/2020    Scheduling Instructions:     April 2021    Order Specific Question:   Where should this test be performed    Answer:   [facility]    Order Specific Question:   Perflutren DEFINITY (image enhancing agent) should be administered unless hypersensitivity or allergy exist    Answer:   Administer Perflutren    Order Specific Question:   Reason for exam-Echo    Answer:   Other-Full Diagnosis List    Order Specific Question:   Full ICD-10/Reason for Exam    Answer:   History of pulmonary embolus (PE) [id number]    Meds ordered this encounter  Medications  . apixaban (ELIQUIS) 5 MG TABS tablet    Sig: Take 1 tablet (5 mg total) by mouth 2 (two) times daily.    Dispense:  60 tablet    Refill:  4    Return in about 4 months (around 08/15/2019).    Please do your part to reduce the spread of COVID-19.

## 2019-04-17 ENCOUNTER — Telehealth: Payer: Self-pay

## 2019-04-17 NOTE — Telephone Encounter (Signed)
error 

## 2019-05-12 NOTE — Progress Notes (Signed)
NEUROLOGY CONSULTATION NOTE  Leah Farmer MRN: [id number] DOB: 07/20/1965  Referring provider: Margy Clarks, NP Primary care provider: Margy Clarks, NP  Reason for consult:  Right foot drop  HISTORY OF PRESENT ILLNESS: Leah Farmer is a 54 year old female with diabetes mellitus and arthritis who presents for right foot drop.  History supplemented by operative and referring provider notes.  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  Following the procedure, she had a right foot drop.  It was presumed to be due to the epidural.  During the surgery, she was not in stirrups.  Following surgery, she has had complications requiring hospitalizations in January for abdominal abscess and pulmonary emboli. She underwent home physical therapy for the next two months without any improvement.  She uses the walker outside her home.  She has not had any falls but that is a concern for her.  She denies low back pain or pain radiating down the leg, although she does have a history of right sided sciatica.  She notes tingling and numbness along the lateral aspect of her shin and over the dorsum of her right foot.  She denies saddle anesthesia or bowel or bladder dysfunction.  She was on oral medication for diabetes but following the surgery, she has been on insulin.      PAST MEDICAL HISTORY: Past Medical History:  Diagnosis Date  . Anxiety   . Arthritis 2020   lower back and hip; takes only OTC Motrin  . Depression    mild and situational; not medicated  . Diabetes mellitus without complication ([place])    unsure if type 1 or 2 , dx as an adult   . GERD (gastroesophageal reflux disease)   . History of kidney stones 07/2018   hospitalized at [facility]; passed on her own  . Hypertension   . NAFL (nonalcoholic fatty liver) 0000000   fatty liver seen on CT scan abd/ pelvis  . Nephrolithiasis    lov in june 2020 , also had  covid at this time   . pancreatic ca dx'd 08/2018   pancreatic  . Pneumonia   . PONV (postoperative nausea and vomiting)   . Tachycardia    generally runs in the 120s    PAST SURGICAL HISTORY: Past Surgical History:  Procedure Laterality Date  . APPENDECTOMY  in 20s  . CHOLECYSTECTOMY  in 20s  . ESOPHAGOGASTRODUODENOSCOPY (EGD) WITH PROPOFOL N/A 11/01/2018   Procedure: ESOPHAGOGASTRODUODENOSCOPY (EGD) WITH PROPOFOL;  Surgeon: Milus Banister, MD;  Location: [facility];  Service: Endoscopy;  Laterality: N/A;  . EUS N/A 11/01/2018   Procedure: UPPER ENDOSCOPIC ULTRASOUND (EUS) LINEAR;  Surgeon: Milus Banister, MD;  Location: [facility];  Service: Endoscopy;  Laterality: N/A;  . FINE NEEDLE ASPIRATION N/A 11/01/2018   Procedure: FINE NEEDLE ASPIRATION (FNA) LINEAR;  Surgeon: Milus Banister, MD;  Location: [facility];  Service: Endoscopy;  Laterality: N/A;  . IR CATHETER TUBE CHANGE  02/16/2019  . IR RADIOLOGIST EVAL & MGMT  03/14/2019  . IR THORACENTESIS ASP PLEURAL SPACE W/IMG GUIDE  01/21/2019  . LAPAROSCOPY N/A 01/15/2019   Procedure: LAPAROSCOPY DIAGNOSTIC;  Surgeon: Stark Klein, MD;  Location: [facility];  Service: General;  Laterality: N/A;  . WHIPPLE PROCEDURE N/A 01/15/2019   Procedure: WHIPPLE PROCEDURE;  Surgeon: Stark Klein, MD;  Location: [facility];  Service: General;  Laterality: N/A;    MEDICATIONS: Current Outpatient Medications on File Prior to Visit  Medication Sig Dispense Refill  . acetaminophen (TYLENOL) 325 MG tablet Take 2 tablets (650 mg total) by mouth every 6 (six) hours as needed for mild pain (or temp > 100).    Marland Kitchen apixaban (ELIQUIS) 5 MG TABS tablet Take 1 tablet (5 mg total) by mouth 2 (two) times daily. 180 tablet 1  . apixaban (ELIQUIS) 5 MG TABS tablet Take 1 tablet (5 mg total) by mouth 2 (two) times daily. 60 tablet 4  . clonazePAM (KLONOPIN) 0.5 MG tablet Take 0.25-0.5 mg by mouth daily as needed for anxiety.    . feeding supplement, GLUCERNA SHAKE,  (GLUCERNA SHAKE) LIQD Take 237 mLs by mouth 3 (three) times daily between meals. 90 mL 0  . insulin glargine (LANTUS) 100 UNIT/ML injection Inject 0.25 mLs (25 Units total) into the skin 2 (two) times daily. 10 mL 3  . lipase/protease/amylase (CREON) 36000 UNITS CPEP capsule Two to three tabs with meals and 1-2 tabs with snacks. 240 capsule 11  . lisinopril (ZESTRIL) 20 MG tablet Take 1 tablet (20 mg total) by mouth daily. 90 tablet 1  . loperamide (IMODIUM) 2 MG capsule Take 1 capsule (2 mg total) by mouth as needed for diarrhea or loose stools. 30 capsule 0  . meclizine (ANTIVERT) 12.5 MG tablet Take 12.5 mg by mouth 3 (three) times daily as needed for dizziness.    . Menthol, Topical Analgesic, (BIOFREEZE ROLL-ON EX) Apply 1 application topically 3 (three) times daily as needed (back pain.).    Marland Kitchen metoCLOPramide (REGLAN) 10 MG tablet Take 1 tablet (10 mg total) by mouth 4 (four) times daily -  before meals and at bedtime. 120 tablet 3  . metoprolol succinate (TOPROL-XL) 50 MG 24 hr tablet Take 50 mg by mouth at bedtime. Take with or immediately following a meal.     . mirtazapine (REMERON) 15 MG tablet Take 0.5 tablets (7.5 mg total) by mouth at bedtime. 30 tablet 1  . omeprazole (PRILOSEC) 20 MG capsule Take 20 mg by mouth daily.    . ondansetron (ZOFRAN) 8 MG tablet Take 1 tablet (8 mg total) by mouth every 8 (eight) hours as needed for nausea. 40 tablet 3  . oxyCODONE (OXY IR/ROXICODONE) 5 MG immediate release tablet Take 1-2 tablets (5-10 mg total) by mouth every 6 (six) hours as needed for moderate pain, severe pain or breakthrough pain. 30 tablet 0  . potassium chloride SA (KLOR-CON) 10 MEQ tablet Take 2 tablets (20 mEq total) by mouth 2 (two) times daily. 60 tablet 0  . promethazine (PHENERGAN) 12.5 MG tablet Take 12.5 mg by mouth every 6 (six) hours as needed.    Marland Kitchen PROMETHEGAN 25 MG suppository Place 25 mg rectally every 6 (six) hours as needed for nausea or vomiting.     . rosuvastatin  (CRESTOR) 10 MG tablet Take 10 mg by mouth at bedtime.    . saccharomyces boulardii (FLORASTOR) 250 MG capsule Take 1 capsule (250 mg total) by mouth 2 (two) times daily. 30 capsule 0  . sodium chloride flush (NS) 0.9 % SOLN 5 mLs by Intracatheter route every 8 (eight) hours. 90 Syringe 0  . traMADol (ULTRAM) 50 MG tablet Take 50 mg by mouth 2 (two) times daily as needed (kidney stone pain).     . Vitamin D, Ergocalciferol, (DRISDOL) 1.25 MG (50000 UT) CAPS capsule Take 50,000 Units by mouth every Saturday.     Current Facility-Administered Medications on File Prior to Visit  Medication Dose Route Frequency Provider Last Rate  Last Admin  . sodium chloride 0.9 % 1,000 mL with thiamine 123XX123 mg, folic acid 1 mg, multivitamins adult 10 mL infusion   Intravenous Once Stark Klein, MD      . sodium chloride 0.9 % 1,000 mL with thiamine 123XX123 mg, folic acid 1 mg, multivitamins adult 10 mL infusion   Intravenous Once Stark Klein, MD      . sodium chloride 0.9 % bolus 1,000 mL  1,000 mL Intravenous Once Stark Klein, MD      . sodium chloride 0.9 % bolus 1,000 mL  1,000 mL Intravenous Once Stark Klein, MD        ALLERGIES: Allergies  Allergen Reactions  . Ciprofloxacin Hives    FAMILY HISTORY: Family History  Problem Relation Age of Onset  . Hypertension Mother   . Diabetes Mother   . Pancreatic cancer Mother   . Hypertension Father   . Diabetes Father   . Esophageal cancer Maternal Uncle        Passed at 51. Found colon cancer on colonoscopy at that time.   . Colon cancer Neg Hx   . Colon polyps Neg Hx     SOCIAL HISTORY: Social History   Socioeconomic History  . Marital status: Married    Spouse name: Not on file  . Number of children: Not on file  . Years of education: Not on file  . Highest education level: Not on file  Occupational History  . Not on file  Tobacco Use  . Smoking status: Never Smoker  . Smokeless tobacco: Never Used  Substance and Sexual Activity  .  Alcohol use: Never  . Drug use: Never  . Sexual activity: Not Currently  Other Topics Concern  . Not on file  Social History Narrative  . Not on file   Social Determinants of Health   Financial Resource Strain:   . Difficulty of Paying Living Expenses:   Food Insecurity:   . Worried About Charity fundraiser in the Last Year:   . Arboriculturist in the Last Year:   Transportation Needs:   . Film/video editor (Medical):   Marland Kitchen Lack of Transportation (Non-Medical):   Physical Activity:   . Days of Exercise per Week:   . Minutes of Exercise per Session:   Stress:   . Feeling of Stress :   Social Connections:   . Frequency of Communication with Friends and Family:   . Frequency of Social Gatherings with Friends and Family:   . Attends Religious Services:   . Active Member of Clubs or Organizations:   . Attends Archivist Meetings:   Marland Kitchen Marital Status:   Intimate Partner Violence:   . Fear of Current or Ex-Partner:   . Emotionally Abused:   Marland Kitchen Physically Abused:   . Sexually Abused:     PHYSICAL EXAM: Blood pressure 135/84, height 5\' 4"  (1.626 m), weight 206 lb (93.4 kg), SpO2 97 %. General: No acute distress.  Patient appears well-groomed.  Head:  Normocephalic/atraumatic Eyes:  fundi examined but not visualized Neck: supple, no paraspinal tenderness, full range of motion Back: No paraspinal tenderness Heart: regular rate and rhythm Lungs: Clear to auscultation bilaterally. Vascular: No carotid bruits. Neurological Exam: Mental status: alert and oriented to person, place, and time, recent and remote memory intact, fund of knowledge intact, attention and concentration intact, speech fluent and not dysarthric, language intact. Cranial nerves: CN I: not tested CN II: pupils equal, round and reactive to light,  visual fields intact CN III, IV, VI:  full range of motion, no nystagmus, no ptosis CN V: facial sensation intact CN VII: upper and lower face  symmetric CN VIII: hearing intact CN IX, X: gag intact, uvula midline CN XI: sternocleidomastoid and trapezius muscles intact CN XII: tongue midline Bulk & Tone: normal, no fasciculations. Motor:  5-/5 right hip flexion, knee extension and flexion.  0/5 right ankle dorsiflexion and extensor hallucis longus muscle.  5/5 right foot inversion.  Otherwise, 5/5. Sensation:  Pinprick sensation reduced on dorsum of right foot and lateral lower right leg below the knee; and vibration sensation intact.. Deep Tendon Reflexes:  2+ upper extremities, 1+ patellars, absent ankles toes downgoing. Finger to nose testing:  Without dysmetria.  Gait:  Right steppage gait.  Romberg negative.  IMPRESSION: 1.  Right foot drop.  Exam suggestive of peroneal neuropathy.  Foot inversion intact, suggesting L5 nerve root intact.  She exhibits some mild proximal leg muscle weakness which I suspect is due to deconditioning.  While neuropraxia is often seen after surgical procedures (whether due to epidural or nerve compression based on leg positioning, I would have expected her to have recovered.   PLAN: 1.  We will first check NCV-EMG of right lower extremity to localize injury and assess severity.  It will be somewhat limited as the lumbar paraspinal muscles will not be evaluated due to being on anticoagulation.  Further recommendations, such as need for MRI of lumbar spine, pending results.  Thank you for allowing me to take part in the care of this patient.  Metta Clines, DO  CC:  Margy Clarks, NP

## 2019-05-13 ENCOUNTER — Other Ambulatory Visit: Payer: Self-pay

## 2019-05-13 ENCOUNTER — Ambulatory Visit: Payer: BC Managed Care – PPO | Admitting: Neurology

## 2019-05-13 VITALS — BP 135/84 | Ht 64.0 in | Wt 206.0 lb

## 2019-05-13 DIAGNOSIS — M21371 Foot drop, right foot: Secondary | ICD-10-CM

## 2019-05-13 NOTE — Patient Instructions (Signed)
1.  We will check a nerve conduction study of the right lower extremity 2.  Further recommendations pending results.

## 2019-05-22 ENCOUNTER — Ambulatory Visit (INDEPENDENT_AMBULATORY_CARE_PROVIDER_SITE_OTHER): Payer: BC Managed Care – PPO | Admitting: Neurology

## 2019-05-22 ENCOUNTER — Other Ambulatory Visit: Payer: Self-pay

## 2019-05-22 DIAGNOSIS — M21371 Foot drop, right foot: Secondary | ICD-10-CM

## 2019-05-22 NOTE — Procedures (Signed)
[facility]  [address]  [place], [place] [address] Tel: [phone] Fax:  [phone] Test Date:  05/22/2019  Patient: Leah Farmer DOB: May 31, 1965 Physician: Narda Amber, DO  Sex: Female Height: 5\' 4"  Ref Phys: Metta Clines, D.O.  ID#: [id number] Temp: 34.0C Technician:    Patient Complaints: This is a 54 year old female referred for evaluation of right foot drop.  NCV & EMG Findings: Electrodiagnostic testing was limited and prematurely terminated at patient's request due to pain.  Findings are as follows:  1. Right superficial peroneal sensory response is absent. 2. Right peroneal motor response is absent at the extensor digitorum brevis and tibialis anterior.  Right tibial motor responses within normal limits.  Impression: This is an incomplete study.  No meaningful conclusions can be made as testing was terminated due to pain.   ___________________________ Narda Amber, DO    Nerve Conduction Studies Anti Sensory Summary Table   Stim Site NR Peak (ms) Norm Peak (ms) P-T Amp (V) Norm P-T Amp  Right Sup Peroneal Anti Sensory (Ant Lat Mall)  34C  12 cm NR  <4.6  >4   Motor Summary Table   Stim Site NR Onset (ms) Norm Onset (ms) O-P Amp (mV) Norm O-P Amp Site1 Site2 Delta-0 (ms) Dist (cm) Vel (m/s) Norm Vel (m/s)  Right Peroneal Motor (Ext Dig Brev)  34C  Ankle NR  <6.0  >2.5 B Fib Ankle  0.0  >40  B Fib NR     Poplt B Fib  0.0  >40  Poplt NR            Right Peroneal TA Motor (Tib Ant)  34C  Fib Head NR  <4.5  >3 Poplit Fib Head  0.0  >40  Right Tibial Motor (Abd Hall Brev)  34C  Ankle    3.3 <6.0 9.2 >4 Knee Ankle 8.0 38.0 48 >40  Knee    11.3  6.9             Waveforms:

## 2019-05-24 ENCOUNTER — Other Ambulatory Visit (HOSPITAL_COMMUNITY): Payer: BC Managed Care – PPO

## 2019-06-10 ENCOUNTER — Ambulatory Visit (HOSPITAL_COMMUNITY)
Admission: RE | Admit: 2019-06-10 | Discharge: 2019-06-10 | Disposition: A | Discharge status: Home / Self Care | Payer: BC Managed Care – PPO | Source: Ambulatory Visit | Attending: Critical Care Medicine | Admitting: Critical Care Medicine

## 2019-06-10 ENCOUNTER — Other Ambulatory Visit: Payer: Self-pay

## 2019-06-10 DIAGNOSIS — Z86711 Personal history of pulmonary embolism: Secondary | ICD-10-CM | POA: Diagnosis present

## 2019-06-10 DIAGNOSIS — Z8679 Personal history of other diseases of the circulatory system: Secondary | ICD-10-CM

## 2019-06-10 NOTE — Progress Notes (Signed)
*  PRELIMINARY RESULTS* Echocardiogram 2D Echocardiogram has been performed.  Samuel Germany 06/10/2019, 12:34 PM

## 2019-06-10 NOTE — Progress Notes (Signed)
Please let Leah Farmer know that her follow up echo looks great. Thanks!

## 2019-08-28 ENCOUNTER — Telehealth: Payer: Self-pay | Admitting: Critical Care Medicine

## 2019-08-28 NOTE — Telephone Encounter (Signed)
Called and spoke to pt.  Pt stated that she picked up her last Rx for Eliquis today. She is scheduled for a f/u with Dr. Carlis Abbott on 10/01/2019, however she will complete current prescription of eliquis 1 week prior to that visit.  Per last OV note, it appears that Dr. Carlis Abbott planned for pt to continue Eliquis for 61mo. This month is the sixth month per pt.  Pt is questioning if she should continue this medication until next office visit. If so she will need a refill.  Pt is aware that Dr. Carlis Abbott is currently out of the office. Pt is okay with waiting until she returns to clinic for a response.   Dr. Carlis Abbott, please advise. Thanks

## 2019-09-02 NOTE — Telephone Encounter (Signed)
Please keep her on it until I see her. Please also find out if she has followed up with an oncologist or who is following up her cancer after her surgery last year. I need to know if she has had any recent scans or workup to assess for recurrence. Thanks!  Julian Hy, DO 09/02/19 5:00 PM [facility]

## 2019-09-03 MED ORDER — APIXABAN 5 MG PO TABS: 5.0000 mg | ORAL_TABLET | Freq: Two times a day (BID) | ORAL | 1 refills | Status: DC

## 2019-09-03 NOTE — Telephone Encounter (Signed)
That is fine, then keep her on Eliquis until I see her.  Julian Hy, DO 09/03/19 12:21 PM [facility]

## 2019-09-03 NOTE — Telephone Encounter (Signed)
Patient is following up with her surgeon, Hayden Rasmussen, in Sept., she has not seen oncology yet. Refill sent for Eliquis. FYI Dr. Carlis Abbott. No recent scans, last CT 03/14/2019.

## 2019-09-04 NOTE — Telephone Encounter (Signed)
Spoke with Leah Farmer. She is aware of Dr. Ainsley Spinner response. Rx had already been sent in. Nothing further was needed.

## 2019-10-01 ENCOUNTER — Encounter: Payer: Self-pay | Admitting: Critical Care Medicine

## 2019-10-01 ENCOUNTER — Ambulatory Visit: Payer: BC Managed Care – PPO | Admitting: Critical Care Medicine

## 2019-10-01 ENCOUNTER — Other Ambulatory Visit: Payer: Self-pay

## 2019-10-01 VITALS — BP 126/78 | HR 95 | Temp 98.0°F | Ht 64.0 in | Wt 201.8 lb

## 2019-10-01 DIAGNOSIS — Z7901 Long term (current) use of anticoagulants: Secondary | ICD-10-CM | POA: Diagnosis not present

## 2019-10-01 DIAGNOSIS — Z86711 Personal history of pulmonary embolism: Secondary | ICD-10-CM | POA: Diagnosis not present

## 2019-10-01 NOTE — Patient Instructions (Addendum)
Thank you for visiting Dr. Carlis Abbott at [facility]. We recommend the following:  Keep taking Eliquis 2 times daily until 2 weeks after your second covid vaccine, then stop.  Return in about 3 months (around 12/31/2019).    Please do your part to reduce the spread of COVID-19.

## 2019-10-01 NOTE — Progress Notes (Signed)
Synopsis: Referred in January 2021 for submassive PE by Margy Clarks, NP.  Subjective:   PATIENT ID: Leah Farmer GENDER: female DOB: [birth year], MRN: [id number]  Chief Complaint  Patient presents with  . Follow-up    Discuss stopping Eliquis. Not seen Oncologist, seeing surgeon     Leah Farmer is a 54 y/o woman with a history of neuroendocrine pancreatic tumor who developed a submassive PE post-operatively in January 2021.  She is accompanied today by her husband.  She was treated conservatively with anticoagulation given her previous surgery and complication of intra-abdominal abscess requiring percutaneous drainage just before her PE.  She has had resolution of right heart strain on echocardiogram at 73-month follow-up.  She feels well.  Her recovery from her surgery has been long; her percutaneous abdominal drains were not removed until June 2021.  She continues to have foot drop on the right requiring her to ambulate with a walker.  She has not been able to return to work.  Her breathing is not her main limitation.  Has follow-up with her surgeon later this week.  She has not required chemotherapy.  She had clean margins surgically, and they will use serum biomarkers to monitor for recurrence.  No bleeding on anticoagulation over the last 8 months.  She has not yet had her Covid vaccines and is worried about possible thrombotic complications.     televisit 04/15/19: Leah Farmer is a 54 year old woman with a history of acute PE with right heart strain in January 2021.  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  She had complication of intra-abdominal abscesses.  She was hospitalized for 5 days, and was discharged on 1 month of Lovenox prior to switching to Eliquis twice daily.  Since her hospitalization her breathing has improved.  She only has shortness of breath while showering, which she relates more to anxiety associated with her  abdominal drains.  No dyspnea on exertion.  No bleeding issues with anticoagulation.  She has ongoing nausea which has been a problem since surgery, which her surgeon is managing.  Her nausea improved being off Lovenox.  She has persistent foot drop since her surgery, has completed PT, and will be going to see a neurologist.  Recent follow-up with radiology on 03/14/2019, Darrell Allred, PA-C-had reduced size pleural effusion and reduced intra-abdominal drains.  Leah Farmer saw her surgeon last week, who pulled 2 out of the 3 drains.     Past Medical History:  Diagnosis Date  . Anxiety   . Arthritis 2020   lower back and hip; takes only OTC Motrin  . Depression    mild and situational; not medicated  . Diabetes mellitus without complication ([place])    unsure if type 1 or 2 , dx as an adult   . GERD (gastroesophageal reflux disease)   . History of kidney stones 07/2018   hospitalized at [facility]; passed on her own  . Hypertension   . NAFL (nonalcoholic fatty liver) 94/76/5465   fatty liver seen on CT scan abd/ pelvis  . Nephrolithiasis    lov in june 2020 , also had covid at this time   . pancreatic ca dx'd 08/2018   pancreatic  . Pneumonia   . PONV (postoperative nausea and vomiting)   . Tachycardia    generally runs in the 120s     Family History  Problem Relation Age of Onset  . Hypertension Mother   . Diabetes Mother   .  Pancreatic cancer Mother   . Hypertension Father   . Diabetes Father   . Esophageal cancer Maternal Uncle        Passed at 44. Found colon cancer on colonoscopy at that time.   . Colon cancer Neg Hx   . Colon polyps Neg Hx      Past Surgical History:  Procedure Laterality Date  . APPENDECTOMY  in 20s  . CHOLECYSTECTOMY  in 20s  . ESOPHAGOGASTRODUODENOSCOPY (EGD) WITH PROPOFOL N/A 11/01/2018   Procedure: ESOPHAGOGASTRODUODENOSCOPY (EGD) WITH PROPOFOL;  Surgeon: Milus Banister, MD;  Location: [facility];  Service: Endoscopy;  Laterality: N/A;  . EUS N/A  11/01/2018   Procedure: UPPER ENDOSCOPIC ULTRASOUND (EUS) LINEAR;  Surgeon: Milus Banister, MD;  Location: [facility];  Service: Endoscopy;  Laterality: N/A;  . FINE NEEDLE ASPIRATION N/A 11/01/2018   Procedure: FINE NEEDLE ASPIRATION (FNA) LINEAR;  Surgeon: Milus Banister, MD;  Location: [facility];  Service: Endoscopy;  Laterality: N/A;  . IR CATHETER TUBE CHANGE  02/16/2019  . IR RADIOLOGIST EVAL & MGMT  03/14/2019  . IR THORACENTESIS ASP PLEURAL SPACE W/IMG GUIDE  01/21/2019  . LAPAROSCOPY N/A 01/15/2019   Procedure: LAPAROSCOPY DIAGNOSTIC;  Surgeon: Stark Klein, MD;  Location: [facility];  Service: General;  Laterality: N/A;  . WHIPPLE PROCEDURE N/A 01/15/2019   Procedure: WHIPPLE PROCEDURE;  Surgeon: Stark Klein, MD;  Location: [facility];  Service: General;  Laterality: N/A;    Social History   Socioeconomic History  . Marital status: Married    Spouse name: Not on file  . Number of children: Not on file  . Years of education: Not on file  . Highest education level: Not on file  Occupational History  . Not on file  Tobacco Use  . Smoking status: Never Smoker  . Smokeless tobacco: Never Used  Vaping Use  . Vaping Use: Never used  Substance and Sexual Activity  . Alcohol use: Never  . Drug use: Never  . Sexual activity: Not Currently  Other Topics Concern  . Not on file  Social History Narrative  . Not on file   Social Determinants of Health   Financial Resource Strain:   . Difficulty of Paying Living Expenses: Not on file  Food Insecurity:   . Worried About Charity fundraiser in the Last Year: Not on file  . Ran Out of Food in the Last Year: Not on file  Transportation Needs:   . Lack of Transportation (Medical): Not on file  . Lack of Transportation (Non-Medical): Not on file  Physical Activity:   . Days of Exercise per Week: Not on file  . Minutes of Exercise per Session: Not on file  Stress:   . Feeling of Stress : Not on file  Social Connections:   .  Frequency of Communication with Friends and Family: Not on file  . Frequency of Social Gatherings with Friends and Family: Not on file  . Attends Religious Services: Not on file  . Active Member of Clubs or Organizations: Not on file  . Attends Archivist Meetings: Not on file  . Marital Status: Not on file  Intimate Partner Violence:   . Fear of Current or Ex-Partner: Not on file  . Emotionally Abused: Not on file  . Physically Abused: Not on file  . Sexually Abused: Not on file     Allergies  Allergen Reactions  . Ciprofloxacin Hives      There is no immunization history  on file for this patient.  Outpatient Medications Prior to Visit  Medication Sig Dispense Refill  . apixaban (ELIQUIS) 5 MG TABS tablet Take 1 tablet (5 mg total) by mouth 2 (two) times daily. 60 tablet 1  . clonazePAM (KLONOPIN) 0.5 MG tablet Take 0.25-0.5 mg by mouth daily as needed for anxiety.    . insulin glargine (LANTUS) 100 UNIT/ML injection Inject 0.25 mLs (25 Units total) into the skin 2 (two) times daily. 10 mL 3  . lisinopril (ZESTRIL) 20 MG tablet Take 1 tablet (20 mg total) by mouth daily. 90 tablet 1  . loperamide (IMODIUM) 2 MG capsule Take 1 capsule (2 mg total) by mouth as needed for diarrhea or loose stools. 30 capsule 0  . meclizine (ANTIVERT) 12.5 MG tablet Take 12.5 mg by mouth 3 (three) times daily as needed for dizziness.    . metoprolol succinate (TOPROL-XL) 50 MG 24 hr tablet Take 50 mg by mouth at bedtime. Take with or immediately following a meal.     . ondansetron (ZOFRAN) 8 MG tablet Take 1 tablet (8 mg total) by mouth every 8 (eight) hours as needed for nausea. 40 tablet 3  . oxyCODONE (OXY IR/ROXICODONE) 5 MG immediate release tablet Take 1-2 tablets (5-10 mg total) by mouth every 6 (six) hours as needed for moderate pain, severe pain or breakthrough pain. 30 tablet 0  . promethazine (PHENERGAN) 12.5 MG tablet Take 12.5 mg by mouth every 6 (six) hours as needed.    .  rosuvastatin (CRESTOR) 10 MG tablet Take 10 mg by mouth at bedtime.    . traMADol (ULTRAM) 50 MG tablet Take 50 mg by mouth 2 (two) times daily as needed (kidney stone pain).     . Vitamin D, Ergocalciferol, (DRISDOL) 1.25 MG (50000 UT) CAPS capsule Take 50,000 Units by mouth every Saturday.    Marland Kitchen acetaminophen (TYLENOL) 325 MG tablet Take 2 tablets (650 mg total) by mouth every 6 (six) hours as needed for mild pain (or temp > 100).    Marland Kitchen apixaban (ELIQUIS) 5 MG TABS tablet Take 1 tablet (5 mg total) by mouth 2 (two) times daily. 180 tablet 1  . feeding supplement, GLUCERNA SHAKE, (GLUCERNA SHAKE) LIQD Take 237 mLs by mouth 3 (three) times daily between meals. 90 mL 0  . lipase/protease/amylase (CREON) 36000 UNITS CPEP capsule Two to three tabs with meals and 1-2 tabs with snacks. 240 capsule 11  . Menthol, Topical Analgesic, (BIOFREEZE ROLL-ON EX) Apply 1 application topically 3 (three) times daily as needed (back pain.).    Marland Kitchen metoCLOPramide (REGLAN) 10 MG tablet Take 1 tablet (10 mg total) by mouth 4 (four) times daily -  before meals and at bedtime. (Patient not taking: Reported on 05/13/2019) 120 tablet 3  . mirtazapine (REMERON) 15 MG tablet Take 0.5 tablets (7.5 mg total) by mouth at bedtime. (Patient not taking: Reported on 05/13/2019) 30 tablet 1  . potassium chloride SA (KLOR-CON) 10 MEQ tablet Take 2 tablets (20 mEq total) by mouth 2 (two) times daily. (Patient not taking: Reported on 05/13/2019) 60 tablet 0  . saccharomyces boulardii (FLORASTOR) 250 MG capsule Take 1 capsule (250 mg total) by mouth 2 (two) times daily. 30 capsule 0  . sodium chloride flush (NS) 0.9 % SOLN 5 mLs by Intracatheter route every 8 (eight) hours. 90 Syringe 0   Facility-Administered Medications Prior to Visit  Medication Dose Route Frequency Provider Last Rate Last Admin  . sodium chloride 0.9 % 1,000 mL with thiamine  376 mg, folic acid 1 mg, multivitamins adult 10 mL infusion   Intravenous Once Stark Klein, MD        . sodium chloride 0.9 % 1,000 mL with thiamine 283 mg, folic acid 1 mg, multivitamins adult 10 mL infusion   Intravenous Once Stark Klein, MD      . sodium chloride 0.9 % bolus 1,000 mL  1,000 mL Intravenous Once Stark Klein, MD      . sodium chloride 0.9 % bolus 1,000 mL  1,000 mL Intravenous Once Stark Klein, MD        Review of Systems  Constitutional: Negative.   Respiratory: Negative for shortness of breath.   Gastrointestinal: Negative for blood in stool.  Genitourinary: Negative for hematuria.  Neurological:       Foot drop     Objective:   Vitals:   10/01/19 1052  BP: 126/78  Pulse: 95  Temp: 98 F (36.7 C)  TempSrc: Temporal  SpO2: 98%  Weight: 201 lb 12.8 oz (91.5 kg)  Height: 5\' 4"  (1.626 m)   98% on  RA BMI Readings from Last 3 Encounters:  10/01/19 34.64 kg/m  05/13/19 35.36 kg/m  02/25/19 40.92 kg/m   Wt Readings from Last 3 Encounters:  10/01/19 201 lb 12.8 oz (91.5 kg)  05/13/19 206 lb (93.4 kg)  02/25/19 238 lb 6.4 oz (108.1 kg)    Physical Exam Vitals reviewed.  Constitutional:      General: She is not in acute distress.    Appearance: She is not ill-appearing.  HENT:     Head: Normocephalic and atraumatic.  Eyes:     General: No scleral icterus. Cardiovascular:     Rate and Rhythm: Normal rate and regular rhythm.  Pulmonary:     Comments: Breathing comfortably on RA, no conversational dyspnea. CTAB. Abdominal:     General: There is no distension.     Palpations: Abdomen is soft.     Tenderness: There is no abdominal tenderness.  Musculoskeletal:        General: No swelling or deformity.     Cervical back: Neck supple.  Lymphadenopathy:     Cervical: No cervical adenopathy.  Skin:    General: Skin is warm and dry.     Findings: No rash.  Neurological:     General: No focal deficit present.     Mental Status: She is alert.     Coordination: Coordination normal.  Psychiatric:        Mood and Affect: Mood normal.         Behavior: Behavior normal.      CBC    Component Value Date/Time   WBC 8.8 02/25/2019 0505   RBC 3.92 02/25/2019 0505   HGB 10.6 (L) 02/25/2019 0505   HCT 35.3 (L) 02/25/2019 0505   PLT 320 02/25/2019 0505   MCV 90.1 02/25/2019 0505   MCH 27.0 02/25/2019 0505   MCHC 30.0 02/25/2019 0505   RDW 15.9 (H) 02/25/2019 0505   LYMPHSABS 3.2 02/22/2019 0017   MONOABS 1.2 (H) 02/22/2019 0017   EOSABS 0.2 02/22/2019 0017   BASOSABS 0.1 02/22/2019 0017    CHEMISTRY No results for input(s): NA, K, CL, CO2, GLUCOSE, BUN, CREATININE, CALCIUM, MG, PHOS in the last 168 hours. CrCl cannot be calculated (Patient's most recent lab result is older than the maximum 21 days allowed.).   Left pleural fluid 01/21/2019: Glucose 220 LD 544 Protein 3.6 WBC 2750 (91% PMNs)  Left pleural fluid 01/27/2019: WBC 775 (  34% PMNs, 38% monos, 26% lymphs)   Chest Imaging- films reviewed: CTA chest 02/20/2019-large bilateral PEs.  Dilated RV and RA with reflux of contrast down IVC.  No significant mediastinal or hilar adenopathy.  Small dependent left pleural effusion with overlying atelectasis.  Upper left abdominal drain in place.  Mild groundglass opacities room part of the major fissure on the right.  Two small, dependent small opacities in the right lower lobe.  02/21/2019 lower extremity ultrasound- Right DVT involving popliteal, peroneal veins. Left DVT involving distal femoral veins, left popliteal, left posterior tibial, left peroneal veins.  CT abdomen pelvis 03/14/2019, lung images reviewed-minimal residual left dependent pleural effusion with overlying atelectasis.  Improvement in right lower lobe dependent opacities, now scar.  Improvement in pulmonary artery clot burden.  Pulmonary Functions Testing Results: No flowsheet data found.   Echocardiogram 02/21/2019: LVEF 60 to 32%, grade 1 diastolic dysfunction.  Normal LA.  Dilated RV with hypokinesis, D-shaped on short axis view.  Normal valves.   Dilated IVC with reduced respiratory variability.  Echocardiogram 06/10/2019: LVEF 60 to 65%, mild LVH basal septal segment, grade 1 diastolic dysfunction.  Normal RV size and function.  Normal atria.  Trivial TR.   Assessment & Plan:     ICD-10-CM   1. History of pulmonary embolus (PE)  Z86.711   2. Chronic anticoagulation  Z79.01     History of provoked submassive PE with resolution of right heart strain on subsequent echocardiogram.  She has doe well on Eliquis without bleeding complications.  There is no family history of VTE or previous episodes personally. -Safe to stop apixaban at this point given resolution of risk factors that precipitated her PE, including recent major surgery, malignancy, multiple hospitalizations and immobility.  She is concerned about potential thrombotic complications of Covid vaccinations.  We discussed that it is not unreasonable to continue anticoagulation until 2 weeks past her second mRNA vaccine as she has done so well on anticoagulation that this does not significantly increase her risk to extend it for another 4 to 6 weeks..  With the [employer] vaccine most thrombotic complications were seen within the first 14 days. -If there is evidence of recurrence of her neuroendocrine tumor when she follows up with her surgeon, it would not be unreasonable to resume Eliquis.  RTC in 3 months or sooner as needed.    Current Outpatient Medications:  .  apixaban (ELIQUIS) 5 MG TABS tablet, Take 1 tablet (5 mg total) by mouth 2 (two) times daily., Disp: 60 tablet, Rfl: 1 .  clonazePAM (KLONOPIN) 0.5 MG tablet, Take 0.25-0.5 mg by mouth daily as needed for anxiety., Disp: , Rfl:  .  insulin glargine (LANTUS) 100 UNIT/ML injection, Inject 0.25 mLs (25 Units total) into the skin 2 (two) times daily., Disp: 10 mL, Rfl: 3 .  lisinopril (ZESTRIL) 20 MG tablet, Take 1 tablet (20 mg total) by mouth daily., Disp: 90 tablet, Rfl: 1 .  loperamide (IMODIUM) 2 MG capsule,  Take 1 capsule (2 mg total) by mouth as needed for diarrhea or loose stools., Disp: 30 capsule, Rfl: 0 .  meclizine (ANTIVERT) 12.5 MG tablet, Take 12.5 mg by mouth 3 (three) times daily as needed for dizziness., Disp: , Rfl:  .  metoprolol succinate (TOPROL-XL) 50 MG 24 hr tablet, Take 50 mg by mouth at bedtime. Take with or immediately following a meal. , Disp: , Rfl:  .  ondansetron (ZOFRAN) 8 MG tablet, Take 1 tablet (8 mg total) by mouth  every 8 (eight) hours as needed for nausea., Disp: 40 tablet, Rfl: 3 .  oxyCODONE (OXY IR/ROXICODONE) 5 MG immediate release tablet, Take 1-2 tablets (5-10 mg total) by mouth every 6 (six) hours as needed for moderate pain, severe pain or breakthrough pain., Disp: 30 tablet, Rfl: 0 .  promethazine (PHENERGAN) 12.5 MG tablet, Take 12.5 mg by mouth every 6 (six) hours as needed., Disp: , Rfl:  .  rosuvastatin (CRESTOR) 10 MG tablet, Take 10 mg by mouth at bedtime., Disp: , Rfl:  .  traMADol (ULTRAM) 50 MG tablet, Take 50 mg by mouth 2 (two) times daily as needed (kidney stone pain). , Disp: , Rfl:  .  Vitamin D, Ergocalciferol, (DRISDOL) 1.25 MG (50000 UT) CAPS capsule, Take 50,000 Units by mouth every Saturday., Disp: , Rfl:   Current Facility-Administered Medications:  .  sodium chloride 0.9 % 1,000 mL with thiamine 599 mg, folic acid 1 mg, multivitamins adult 10 mL infusion, , Intravenous, Once, Stark Klein, MD .  sodium chloride 0.9 % 1,000 mL with thiamine 774 mg, folic acid 1 mg, multivitamins adult 10 mL infusion, , Intravenous, Once, Stark Klein, MD .  sodium chloride 0.9 % bolus 1,000 mL, 1,000 mL, Intravenous, Once, Stark Klein, MD .  sodium chloride 0.9 % bolus 1,000 mL, 1,000 mL, Intravenous, Once, Stark Klein, MD    Julian Hy, DO [facility] 10/01/2019 11:13 AM

## 2019-10-09 ENCOUNTER — Other Ambulatory Visit (HOSPITAL_COMMUNITY): Payer: Self-pay | Admitting: General Surgery

## 2019-10-09 DIAGNOSIS — C7A8 Other malignant neuroendocrine tumors: Secondary | ICD-10-CM

## 2019-10-14 ENCOUNTER — Telehealth: Payer: Self-pay | Admitting: Critical Care Medicine

## 2019-10-14 NOTE — Telephone Encounter (Signed)
LMTCB x 1 

## 2019-10-17 NOTE — Telephone Encounter (Signed)
Pt returning a phone call. Pt can be reached at [phone].

## 2019-10-17 NOTE — Telephone Encounter (Signed)
Spoke with the pt  She states that she is wondering about taking zinc and other vitamins to take to help with immune system  She wants to do this b/c she is weary of the covid vaccine right now due to her blood clots  She wants to know if vitamins like zinc, elderberry, hydrargyria root will interact poorly with her blood thinner  Please advise thanks

## 2019-10-17 NOTE — Telephone Encounter (Signed)
Lmtcb for pt.  

## 2019-10-17 NOTE — Telephone Encounter (Signed)
They could- we really don't know. I especially worry about those things interfering with the absorption of her Eliquis.  Julian Hy, DO 10/17/19 7:35 PM [facility]

## 2019-10-18 NOTE — Telephone Encounter (Signed)
Spoke with patient regararding prior message. Per Dr.Clark They could- we really don't know. I especially worry about those things interfering with the absorption of her Eliquis.Patient stated she will hold off on vitamins at this time and take her Eliquis once patient get the courage up to to take the COVID vaccine. Patient's voice was understanding. Nothing else further needed.

## 2019-10-24 ENCOUNTER — Other Ambulatory Visit: Payer: Self-pay

## 2019-10-24 ENCOUNTER — Ambulatory Visit (HOSPITAL_COMMUNITY)
Admission: RE | Admit: 2019-10-24 | Discharge: 2019-10-24 | Disposition: A | Discharge status: Home / Self Care | Payer: BC Managed Care – PPO | Source: Ambulatory Visit | Attending: General Surgery | Admitting: General Surgery

## 2019-10-24 DIAGNOSIS — C7A8 Other malignant neuroendocrine tumors: Secondary | ICD-10-CM | POA: Diagnosis present

## 2019-10-24 MED ORDER — COPPER CU 64 DOTATATE 1 MCI/ML IV SOLN
4.0000 | Freq: Once | INTRAVENOUS | Status: AC
  Administered 2019-10-24: 4.3 via INTRAVENOUS

## 2019-11-14 ENCOUNTER — Telehealth: Payer: Self-pay | Admitting: Critical Care Medicine

## 2019-11-14 MED ORDER — APIXABAN 5 MG PO TABS
5.0000 mg | ORAL_TABLET | Freq: Two times a day (BID) | ORAL | 2 refills | Status: DC
Start: 2019-11-14 — End: 2021-08-30

## 2019-11-14 NOTE — Telephone Encounter (Signed)
Spoke with pt. She is needing a refill on Eliquis. Rx has been sent in. Nothing further was needed.

## 2020-02-18 ENCOUNTER — Telehealth: Payer: Self-pay | Admitting: Critical Care Medicine

## 2020-02-18 NOTE — Telephone Encounter (Signed)
Yes, fine with virtual visit if that is what she prefers. She may wish to establish care in [place]. She had a provoked DVT and likely does not require ongoing AC. If she chooses to not get vaccinated, I do not think that it is reasonable to continue prophylactic AC at this point as it is a very indefinite timeline, although she is correct that covid infections do pose increased risk of VTE.   Thanks!  Julian Hy, DO 02/18/20 5:59 PM [facility]

## 2020-02-18 NOTE — Telephone Encounter (Signed)
02/18/2020  Return telephone call to patient.  She wanted to call in and let Dr. Carlis Abbott know that she is on her last refill of Eliquis.  She still remains unvaccinated.  She was due for follow-up with Dr. Carlis Abbott in November/2021.  She reports that she was never called by our clinic.  Patient would like follow-up visit to be a virtual visit.  She is willing to do a MyChart video visit.  I explained to patient that Dr. Carlis Abbott is no longer seeing patients in clinic and is working in the hospital.  Dr. Carlis Abbott please advise if patient is okay to proceed forward with a MyChart video visit to establish care with a new pulmonologist in the clinic.  Please also advise if you would like for the patient to be maintained on Eliquis any longer as patient remains unvaccinated  Wyn Quaker, FNP

## 2020-02-19 NOTE — Telephone Encounter (Addendum)
Spoke with the pt and scheduled mychart video visit for tomorrow at 9 am

## 2020-02-19 NOTE — Telephone Encounter (Signed)
Triage,   Please call the pt and notify them of Dr. Anell Barr response. Offer an appt in [place] if she would like.   Wyn Quaker FNP

## 2020-02-20 ENCOUNTER — Other Ambulatory Visit (HOSPITAL_COMMUNITY): Payer: Self-pay | Admitting: General Surgery

## 2020-02-20 ENCOUNTER — Other Ambulatory Visit: Payer: Self-pay

## 2020-02-20 ENCOUNTER — Telehealth (INDEPENDENT_AMBULATORY_CARE_PROVIDER_SITE_OTHER): Payer: BC Managed Care – PPO | Admitting: Pulmonary Disease

## 2020-02-20 ENCOUNTER — Other Ambulatory Visit: Payer: Self-pay | Admitting: General Surgery

## 2020-02-20 ENCOUNTER — Encounter: Payer: Self-pay | Admitting: Pulmonary Disease

## 2020-02-20 DIAGNOSIS — J90 Pleural effusion, not elsewhere classified: Secondary | ICD-10-CM

## 2020-02-20 DIAGNOSIS — C7A8 Other malignant neuroendocrine tumors: Secondary | ICD-10-CM

## 2020-02-20 DIAGNOSIS — I2609 Other pulmonary embolism with acute cor pulmonale: Secondary | ICD-10-CM

## 2020-02-20 NOTE — Progress Notes (Signed)
I connected with  Leah Farmer on 02/20/20 by phone/  video enabled telemedicine application and verified that I am speaking with the correct person using two identifiers.     Location: Patient: Home Provider: Office - [facility] - [address], [address], [place], [place] [address]   I discussed the limitations of evaluation and management by telemedicine and the availability of in person appointments. The patient expressed understanding and agreed to proceed. I also discussed with the patient that there may be a patient responsible charge related to this service. The patient expressed understanding and agreed to proceed.   Patient consented to consult via telephone: Yes People present and their role in pt care: Pt   55 year old woman with a history of acute PE with right heart strain in January 2021.  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  She had complication of intra-abdominal abscesses.  She was hospitalized for 5 days, and was discharged on 1 month of Lovenox prior to switching to Eliquis twice daily.  Last OV with PC reviewed 09/2019 -  She was continued on Eliquis since she intended to take the vaccine.  She has not taken the vaccine yet.  She has a foot drop and her husband works so she cannot drive, and was unable to make it for an in person office visit and is converted to video visit. No leg swelling, shortness of breath or chest pain. PET scan from 10/2019 was reviewed which does not show any evidence of recurrent tumor.  She ran out of Eliquis yesterday, no problems with bleeding while she was on it  She is still worried about COVID and does not have contact with people except her husband    Significant tests/ events reviewed Left pleural fluid 01/21/2019: Glucose 220 LD 544 Protein 3.6 WBC 2750 (91% PMNs)  Left pleural fluid 01/27/2019: WBC 775 (34% PMNs, 38% monos, 26% lymphs)   Chest Imaging-  films reviewed: CTA chest 02/20/2019-large bilateral PEs.  Dilated RV and RA with reflux of contrast down IVC.  No significant mediastinal or hilar adenopathy.  Small dependent left pleural effusion with overlying atelectasis.  Upper left abdominal drain in place.  Mild groundglass opacities room part of the major fissure on the right.  Two small, dependent small opacities in the right lower lobe.  02/21/2019 lower extremity ultrasound- Right DVT involving popliteal, peroneal veins. Left DVT involving distal femoral veins, left popliteal, left posterior tibial, left peroneal veins.  CT abdomen pelvis 03/14/2019, lung images reviewed-minimal residual left dependent pleural effusion with overlying atelectasis.  Improvement in right lower lobe dependent opacities, now scar.  Improvement in pulmonary artery clot burden.  Pulmonary Functions Testing Results: No flowsheet data found.   Echocardiogram 02/21/2019: LVEF 60 to 86%, grade 1 diastolic dysfunction.  Normal LA.  Dilated RV with hypokinesis, D-shaped on short axis view.  Normal valves.  Dilated IVC with reduced respiratory variability.  Echocardiogram 06/10/2019: LVEF 60 to 65%, mild LVH basal septal segment, grade 1 diastolic dysfunction.  Normal RV size and function.  Normal atria.  Trivial TR.     Past Medical History:  Diagnosis Date  . Anxiety   . Arthritis 2020   lower back and hip; takes only OTC Motrin  . Depression    mild and situational; not medicated  . Diabetes mellitus without complication ([place])    unsure if type 1 or 2 , dx as an adult   . GERD (gastroesophageal reflux disease)   .  History of kidney stones 07/2018   hospitalized at [facility]; passed on her own  . Hypertension   . NAFL (nonalcoholic fatty liver) 09/32/3557   fatty liver seen on CT scan abd/ pelvis  . Nephrolithiasis    lov in june 2020 , also had covid at this time   . pancreatic ca dx'd 08/2018   pancreatic  . Pneumonia   . PONV (postoperative nausea  and vomiting)   . Tachycardia    generally runs in the 120s    Exam -limited by video, no accessory muscle use able to speak in full sentences   Impression/plan History of PE -she has now completed 1 year of anticoagulation.  Since this was a provoked PE, it is safe to discontinue Eliquis.  I have recommended her to go on aspirin, she is a diabetic and this would also have some protective effect for PE.  Left pleural effusion -resolved on PET scan  No further follow-up required.  We discussed signs and symptoms of recurrent PE, in which case she will contact us  Total encounter time x 23 m  Leah Farmer V. Elsworth Soho MD

## 2020-03-17 ENCOUNTER — Other Ambulatory Visit: Payer: Self-pay

## 2020-03-17 ENCOUNTER — Ambulatory Visit (HOSPITAL_COMMUNITY)
Admission: RE | Admit: 2020-03-17 | Discharge: 2020-03-17 | Disposition: A | Discharge status: Home / Self Care | Payer: BC Managed Care – PPO | Source: Ambulatory Visit | Attending: General Surgery | Admitting: General Surgery

## 2020-03-17 DIAGNOSIS — C7A8 Other malignant neuroendocrine tumors: Secondary | ICD-10-CM | POA: Diagnosis present

## 2020-03-17 MED ORDER — COPPER CU 64 DOTATATE 1 MCI/ML IV SOLN
4.0000 | Freq: Once | INTRAVENOUS | Status: AC
  Administered 2020-03-17: 4.06 via INTRAVENOUS

## 2020-05-10 IMAGING — CT CT ABD-PELV W/O CM
2 of 4 series · 15 of 46 positions shown, 17 images · non-contrast
Comparison: 08/09/2018

CLINICAL DATA: Nausea, dry heaving, diarrhea, pancreatic cancer,
status post Whipple, evaluate pancreatic fluid leak

EXAM:
CT ABDOMEN AND PELVIS WITHOUT CONTRAST
TECHNIQUE: Multidetector CT imaging of the abdomen and pelvis was performed
following the standard protocol without IV contrast.

[Series 2: axial st · axial · 0.90mm/px · z∈[-463,-13]mm · 12 of 102 slices shown, 14 images]
[im 6/102  soft-tissue]
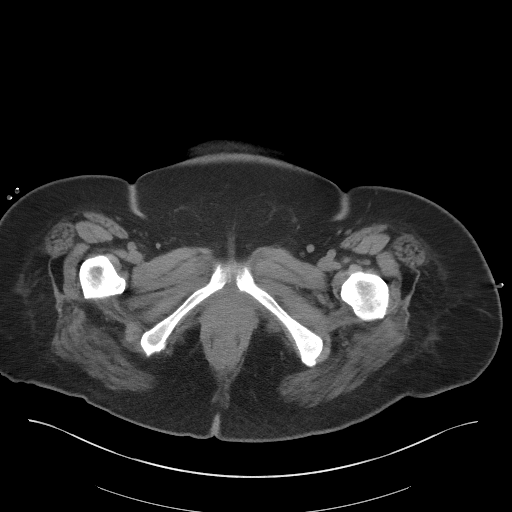
[im 6/102  bone]
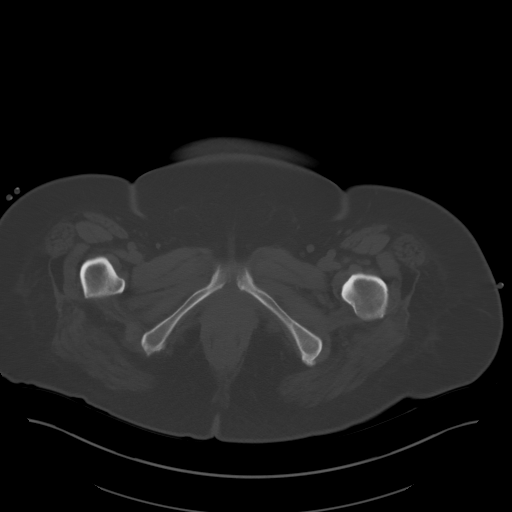
[im 18/102  soft-tissue]
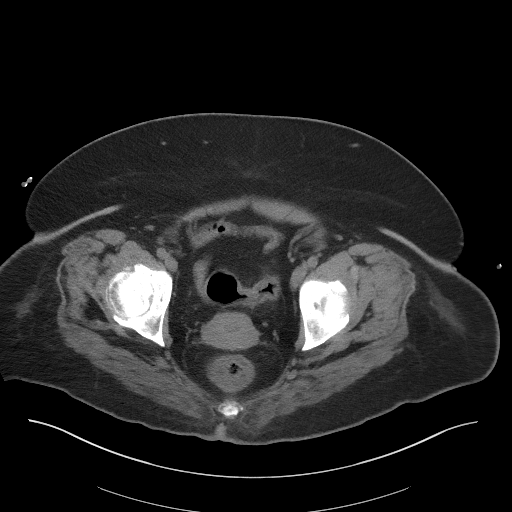
[im 24/102  soft-tissue]
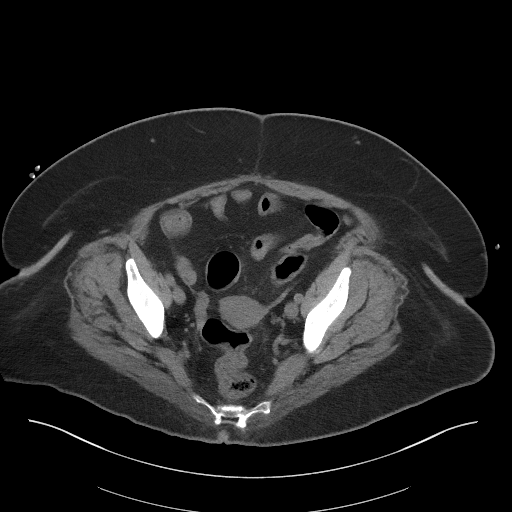
[im 30/102  soft-tissue]
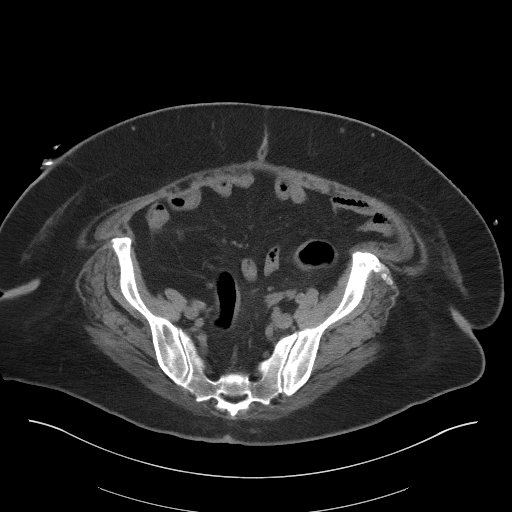
[im 42/102  soft-tissue]
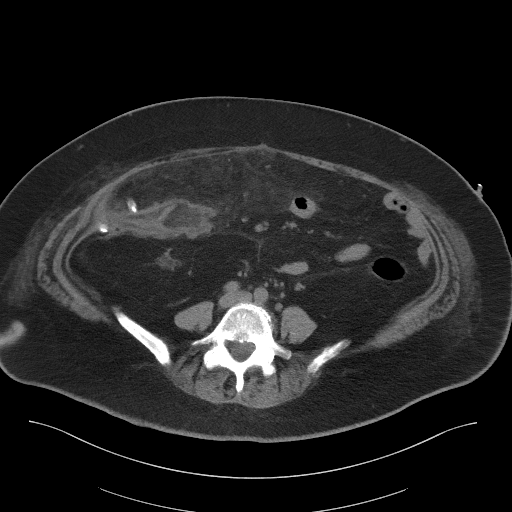
[im 48/102  soft-tissue]
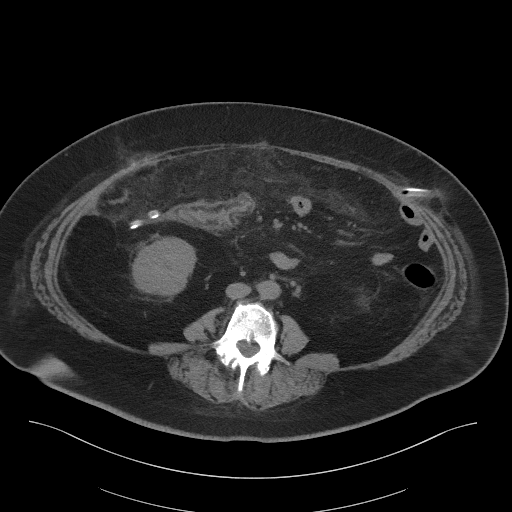
[im 54/102  soft-tissue]
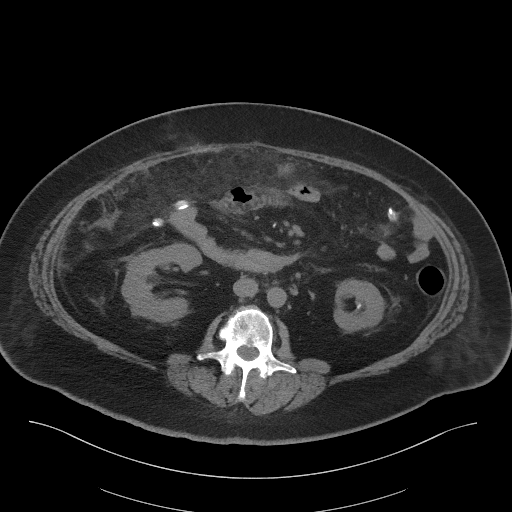
[im 66/102  soft-tissue]
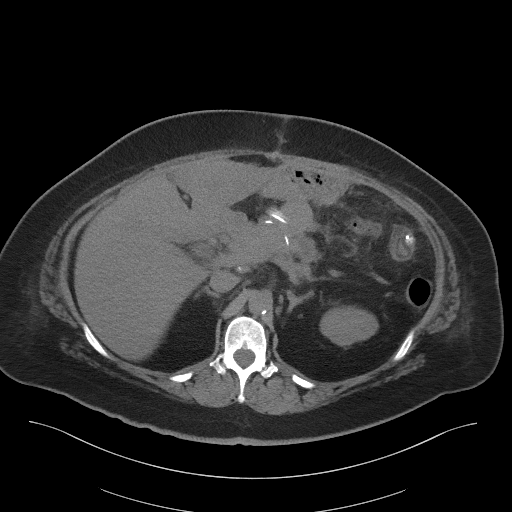
[im 72/102  soft-tissue]
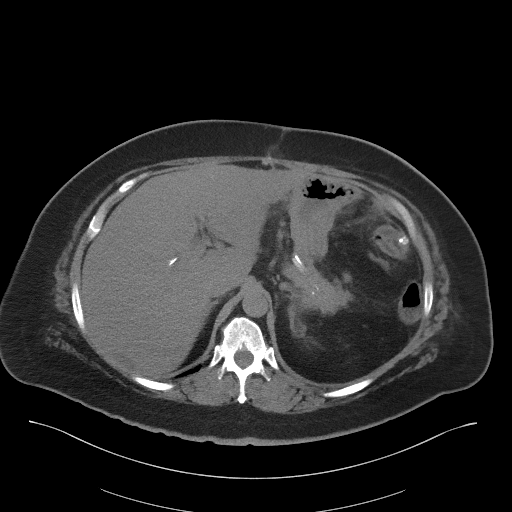
[im 72/102  bone]
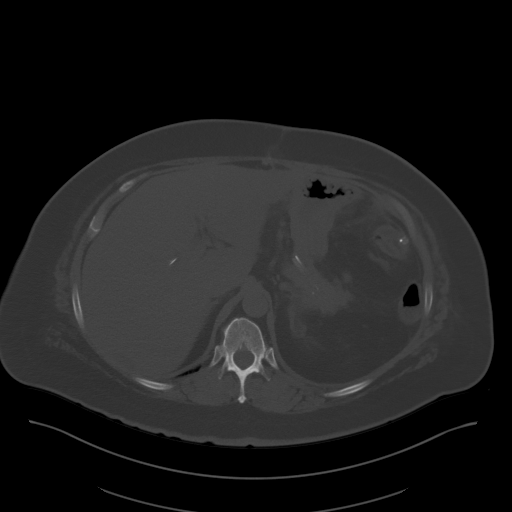
[im 78/102  soft-tissue]
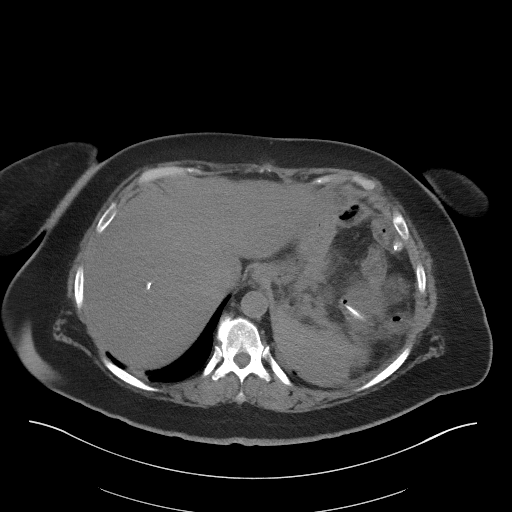
[im 90/102  soft-tissue]
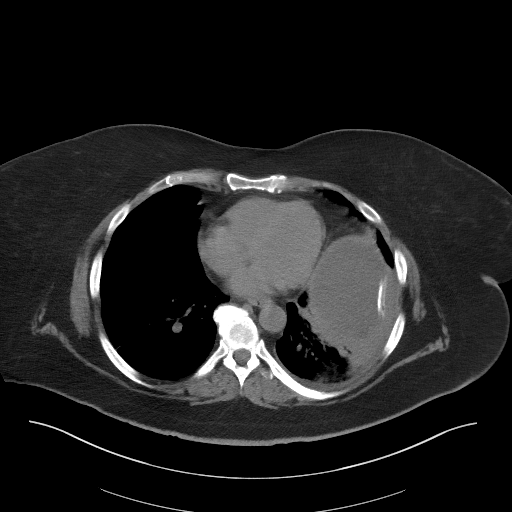
[im 96/102  soft-tissue]
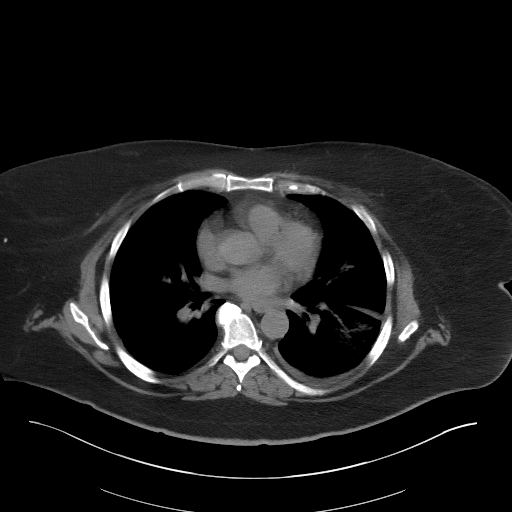

[Series 5: coronal st · coronal · 0.93mm/px · 3 of 107 slices shown]
[im 36/107  soft-tissue]
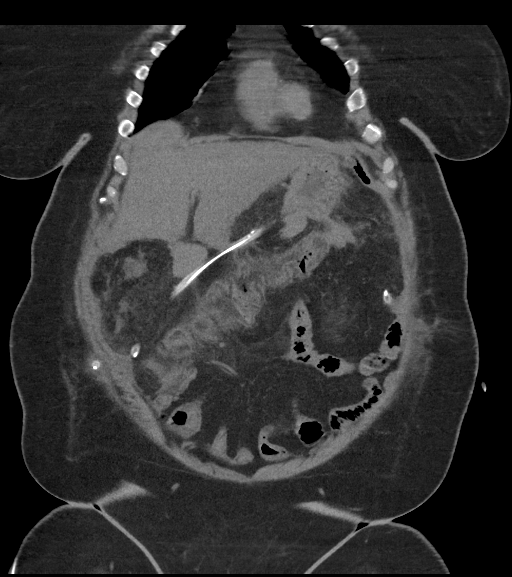
[im 48/107  soft-tissue]
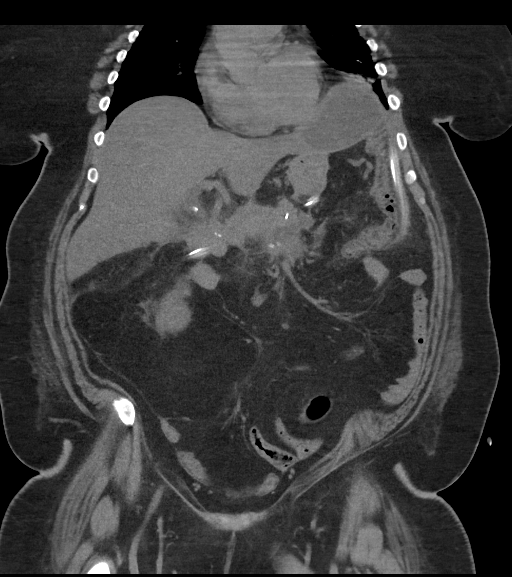
[im 59/107  soft-tissue]
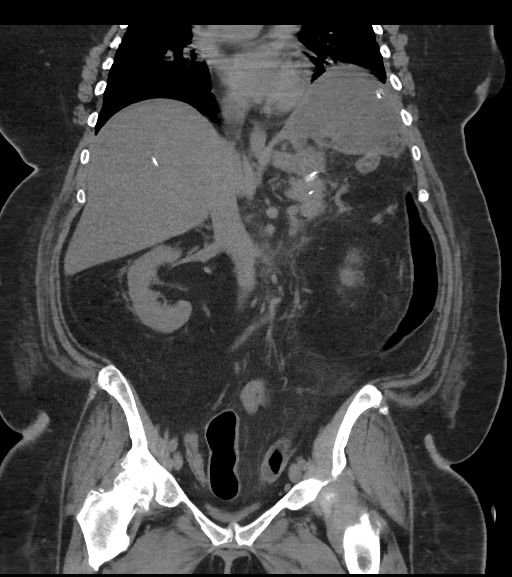

[15 of 46 positions shown; findings below may reference images not displayed]

FINDINGS: Lower chest: Small left pleural effusion and associated atelectasis
or consolidation.

Hepatobiliary: No focal liver abnormality is seen. Status post
cholecystectomy. No biliary dilatation.

Pancreas: Postoperative findings of Whipple pancreaticoduodenectomy
with pancreatic ductal stent (series 2, image 35).

Spleen: Normal in size without significant abnormality.

Adrenals/Urinary Tract: Adrenal glands are unremarkable. Small
nonobstructive left renal calculi bladder is unremarkable.

Stomach/Bowel: Status post Whipple pancreaticoduodenectomy and
gastrojejunostomy. Extensive thickening of the colon wall with
adjacent fat stranding, which involves the length of the colon to
the rectum.

Vascular/Lymphatic: Scattered aortic atherosclerosis. No enlarged
abdominal or pelvic lymph nodes.

Reproductive: No mass or other significant abnormality.

Other: Multiple surgical drainage catheters about the left and right
abdomen, tips positioned in the vicinity of the duodenectomy site,
about the gastric body, and anterior to the pancreatic tail. There
is retroperitoneal fluid at the pancreatic head resection site
anterior to the IVC with an adjacent drainage catheter, collection
measuring approximately 5.4 x 3.0 cm (series 2, image 43). There is
loculated appearing air and fluid in the left upper quadrant about
the spleen and below the left hemidiaphragm, largest component of
collection measuring approximately 13.1 x 10.3 cm (series 2, image
22). Extensive fat stranding of the omentum and mesenteric fat
(series 2, image 48).

Musculoskeletal: No acute or significant osseous findings.
IMPRESSION: 1. Status post Whipple pancreaticoduodenectomy with pancreatic
ductal stent in place.
2. Multiple surgical drainage catheters about the left and right
abdomen, tips positioned in the vicinity of the duodenectomy site,
about the gastric body, and anterior to the pancreatic tail.
3. There is retroperitoneal fluid at the pancreatic head resection
site anterior to the IVC with an adjacent drainage catheter,
collection measuring approximately 5.4 x 3.0 cm (series 2, image
43).
4. There is loculated appearing air and fluid in the left upper
quadrant about the spleen and below the left hemidiaphragm, largest
component of collection measuring approximately 13.1 x 10.3 cm
(series 2, image 22).
5. Biliary leak or abscess are differential considerations given
these postoperative findings. The presence or absence of infection
is not established by CT.
6. Extensive thickening of the colon wall with adjacent fat
stranding, which involves the length of the colon to the rectum,
consistent with nonspecific infectious, inflammatory, or ischemic
colitis.
7. Extensive fat stranding of the omentum and mesenteric fat.
8. Small left pleural effusion and associated atelectasis or
consolidation.
9. Small nonobstructive left renal calculi.
10.  Aortic Atherosclerosis ([id number]).

These results will be called to the ordering clinician or
representative by the Radiologist Assistant, and communication
documented in the PACS or zVision Dashboard.

## 2020-05-11 IMAGING — DX DG CHEST 1V PORT
1 series · 1 of 1 positions shown · non-contrast
Comparison: One-view chest x-ray 01/27/2019

CLINICAL DATA: Tachycardia.

EXAM:
PORTABLE CHEST 1 VIEW

[chest]
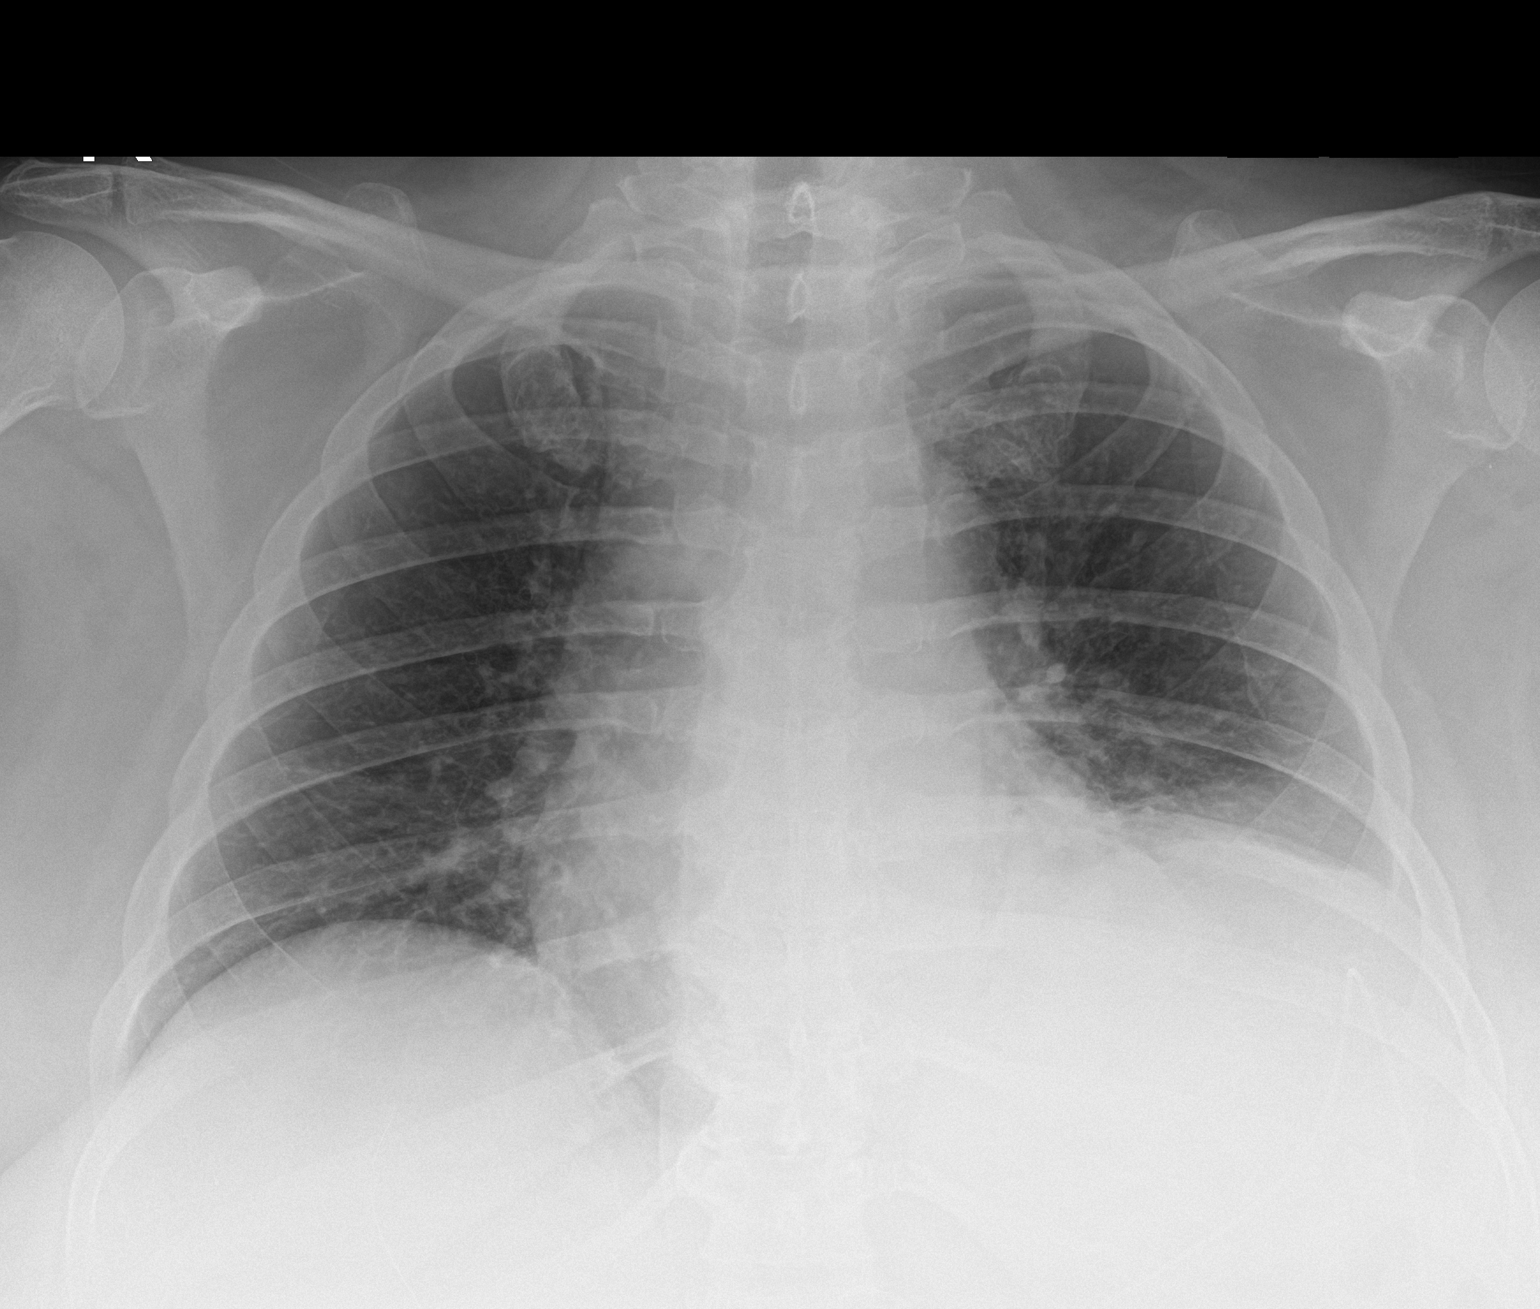

[1 of 1 positions shown; findings below may reference images not displayed]

FINDINGS: The heart is enlarged, exaggerated by low lung volumes. Right lung
is clear. Left basilar airspace disease is improved. Small left
effusion is not excluded. Left upper quadrant drain is noted.
IMPRESSION: 1. Improving left basilar airspace disease.
2. Low lung volumes.
3. No acute cardiopulmonary disease.

## 2020-06-06 IMAGING — CT CT ABD-PELV W/ CM
2 of 4 series · 11 of 46 positions shown, 12 images · IV contrast (iopamidol)
Comparison: CT abdomen pelvis-02/15/2019;

CLINICAL DATA: History of Whipple post fluoroscopic guided exchange
and repositioning previous surgical drainage catheter. Patient
presents today to the [REDACTED] for
drainage catheter evaluation and management.

catheters.
Patient reports approximately 50 cc of purulent appearing fluid from
the left-sided percutaneous drainage catheter.
Patient denies worsening abdominal pain, fever or chills.
EXAM:
CT ABDOMEN AND PELVIS WITH CONTRAST
TECHNIQUE: Multidetector CT imaging of the abdomen and pelvis was performed
using the standard protocol following bolus administration of
intravenous contrast.
CONTRAST:  125mL [id number] IOPAMIDOL ([id number]) INJECTION 61%

[Series 2: abd pelvis 5.00 br40 s3 axial · axial · 0.68mm/px · z∈[+1218,+1678]mm · 8 of 110 slices shown, 9 images]
[im 9/110  soft-tissue]
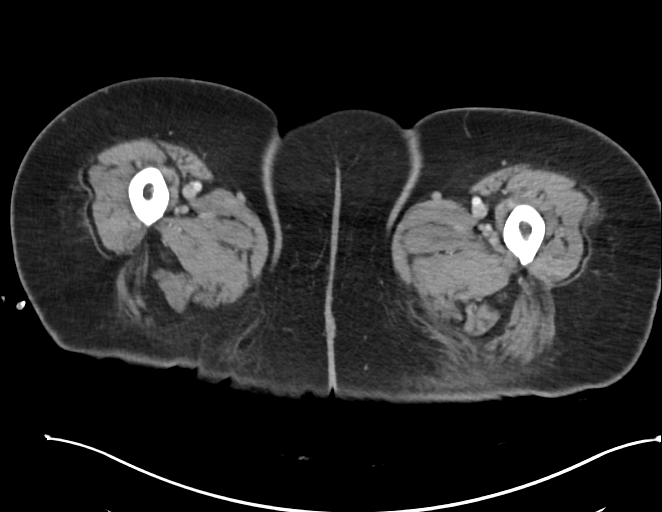
[im 9/110  bone]
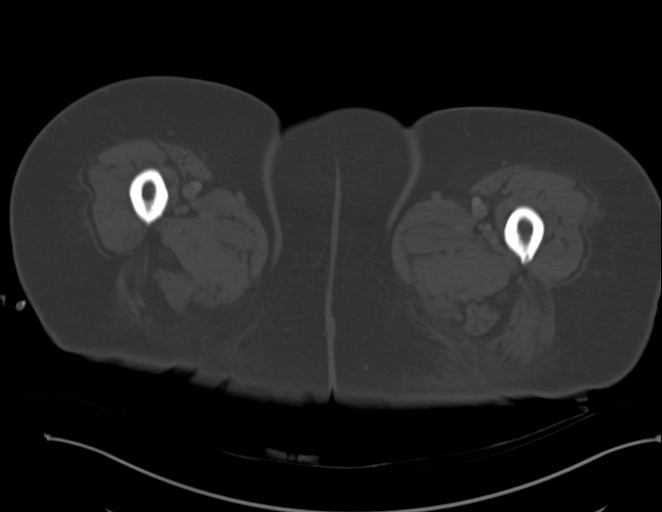
[im 21/110  soft-tissue]
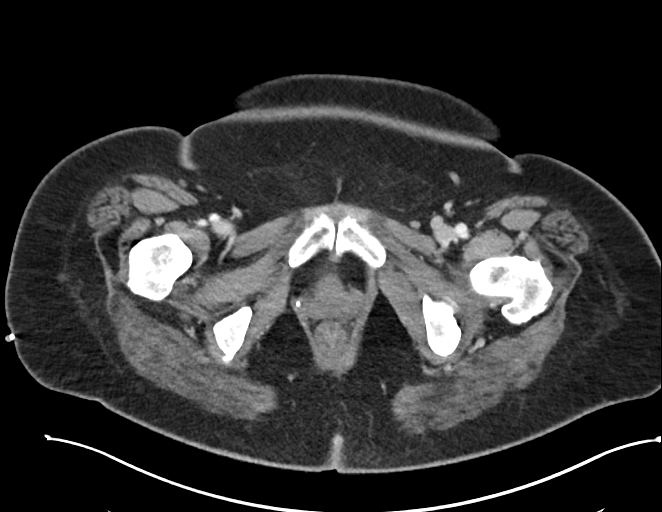
[im 34/110  soft-tissue]
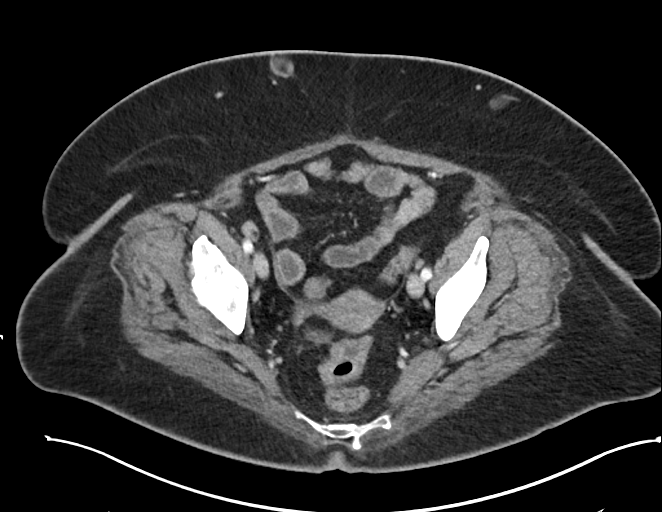
[im 47/110  soft-tissue]
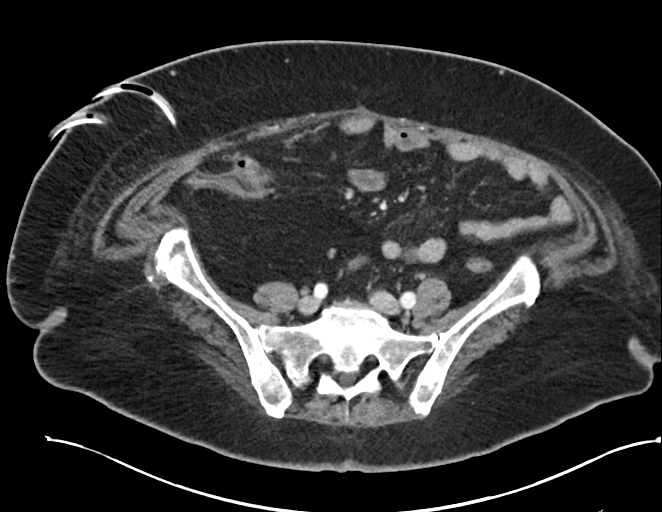
[im 63/110  soft-tissue]
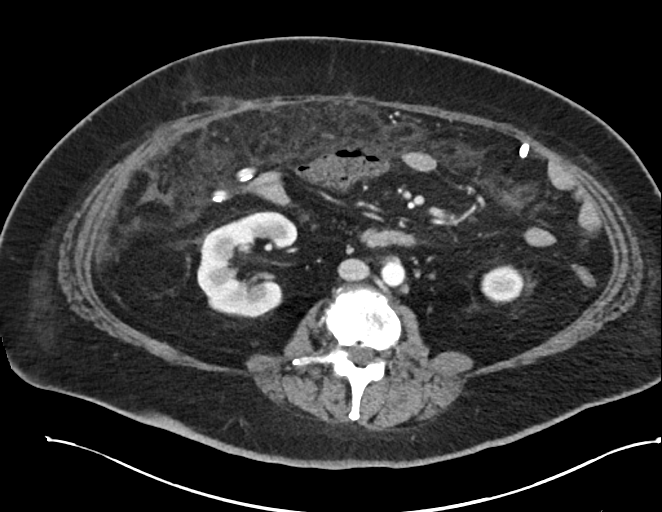
[im 76/110  soft-tissue]
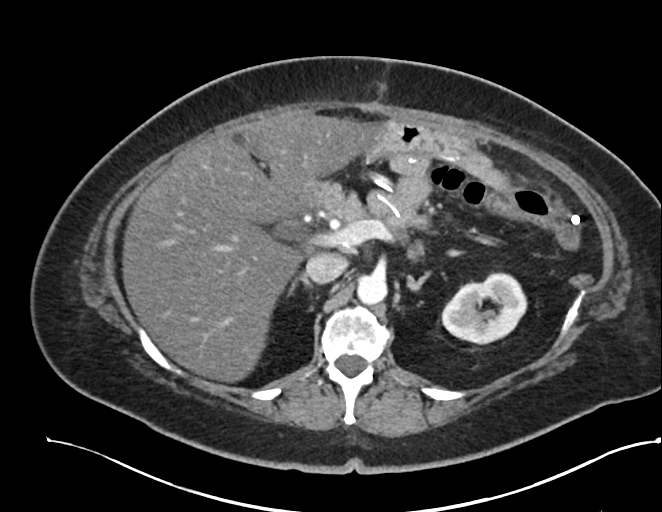
[im 89/110  soft-tissue]
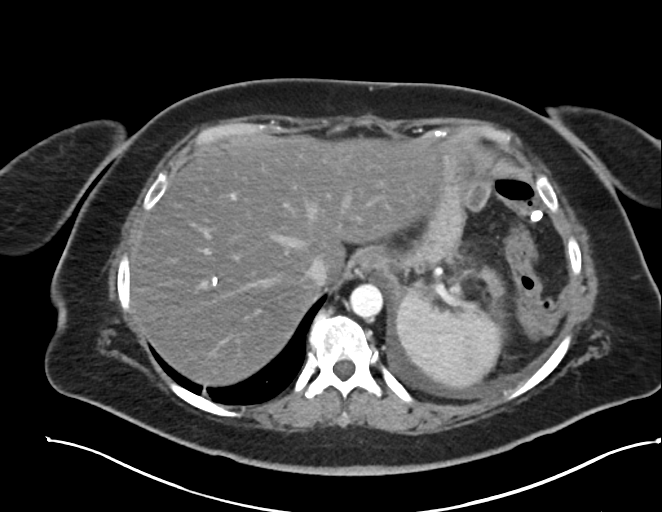
[im 101/110  soft-tissue]
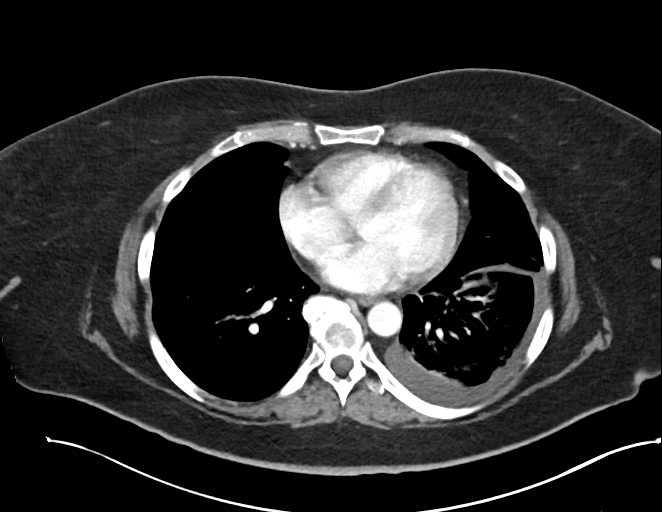

[Series 6: abd pelvis 2.00 br40 s3 cor · coronal · 0.88mm/px · 3 of 173 slices shown]
[im 58/173  soft-tissue]
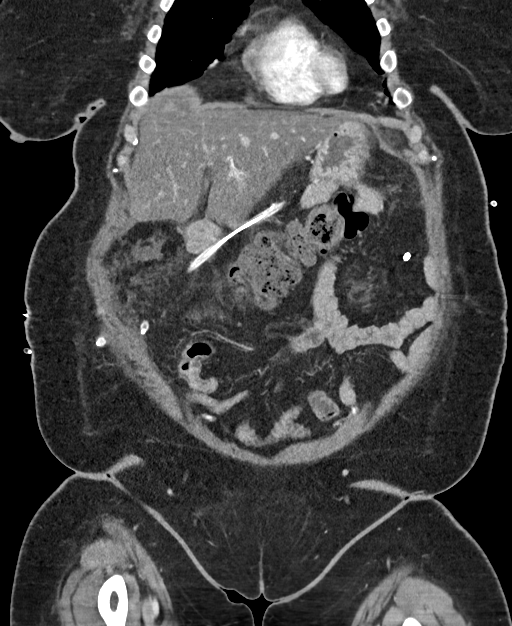
[im 77/173  soft-tissue]
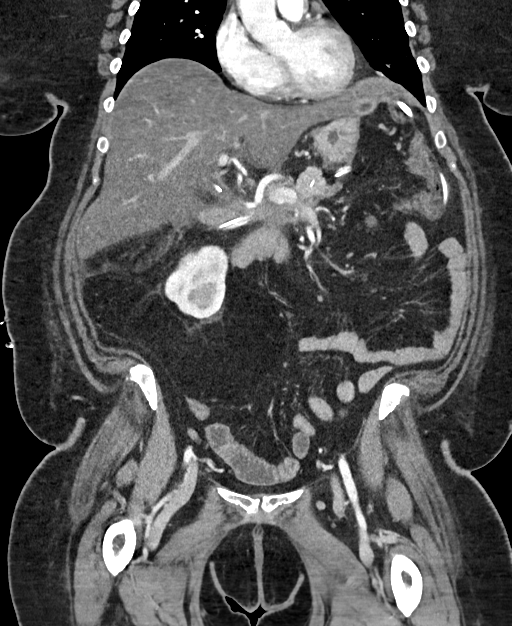
[im 96/173  soft-tissue]
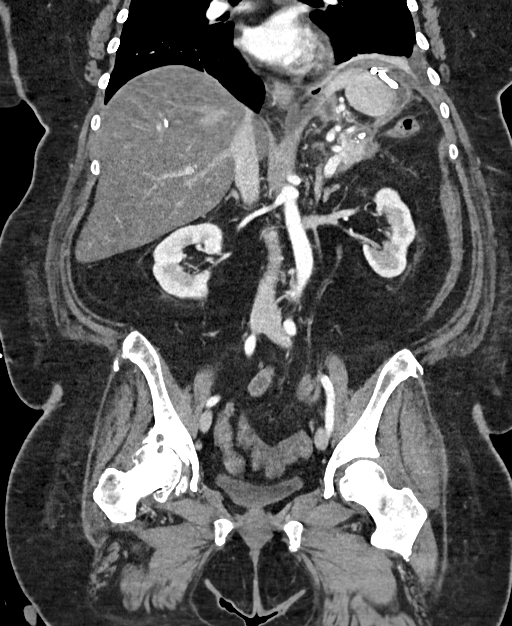

[11 of 46 positions shown; findings below may reference images not displayed]

01/20/2019

Fluoroscopic guided drainage catheter exchange and
repositioning-02/16/2019

Chest CT-02/20/2019
FINDINGS: Lower chest: Limited visualization of the lower thorax demonstrates
interval reduction in persistent small left-sided pleural effusion.
Improved aeration of the left lower lobe with persistent left lower
lobe consolidative opacities and associated air bronchograms.
Minimal subsegmental atelectasis within the contralateral right
lower lobe. Normal heart size. No pericardial effusion.

Hepatobiliary: There is diffuse decreased attenuation of the hepatic
parenchyma on this postcontrast examination. Additional focal fatty
infiltration is noted adjacent to the fissure for ligamentum teres.
No discrete hepatic lesions. Post cholecystectomy. A stent remains
within the mid and central aspect of the CBD. No intra or
extrahepatic biliary ductal dilatation. No ascites.

Pancreas: Sequela of previous Whipple procedure with stable
positioning of pancreatic stent.

Ill-defined fluid collection about the tail the pancreas has
decreased in size in the interval, currently measuring approximately
3.3 x 1.8 x 1.1 cm fluid collection about the tip of the pancreas
(axial image 24, series 2; coronal image 100, series 6), previously,
4.1 x 4.0 cm though appears to maintain communication with
significantly decompressed fluid collection within the left upper
abdominal quadrant (coronal image 100, series 6). No new
definable/drainable peripancreatic fluid collections.

Spleen: Normal appearance of the spleen.

Adrenals/Urinary Tract: There is symmetric enhancement of the
bilateral kidneys. Note is made of punctate (sub 5 mm) bilateral
nonobstructing renal stones (right-coronal image 92, series 6;
left-coronal image 103, series 6). There is a minimal amount of
bilateral likely age and body habitus related perinephric stranding.
No urine obstruction. No discrete renal lesions.

Normal appearance of the bilateral adrenal glands.

Normal appearance of the urinary bladder given degree of distention.

Stomach/Bowel: Stable sequela of previous Whipple. No evidence of
enteric obstruction. No discrete areas of bowel wall thickening. No
pneumoperitoneum, pneumatosis or portal venous gas.

Stable position of exchanged percutaneous drainage catheter with end
coiled and locked within the left upper abdominal quadrant. Residual
serpiginous fluid collection within the left upper abdomen now
measures approximately 6.1 x 1.8 x 4.4 cm (axial image 17, series 2;
coronal image 94, series 6), previously, 13.1 x 10.3 x 8.6 cm.

Stable positioning of remaining large bore surgical drains entering
from the right lower abdomen.

There is a small (approximately 3.3 x 2.3 x 1.6 cm) fluid collection
about the superior most aspect of the superior most surgical
drainage catheter (coronal image 81, series 6; axial image 39,
series 2), decreased in size in the interval, previously, 4.5 x
cm, though note, the previous examination was non contrasted.

There is no definable/drainable fluid collection along the course of
more inferior left-sided large-bore surgical drainage catheter.

Grossly unchanged postoperative ill-defined stranding within the
ventral aspect of the abdominal mesentery without new
definable/drainable fluid collection within the abdomen or pelvis.

Vascular/Lymphatic: Scattered atherosclerotic plaque within normal
caliber abdominal aorta. The major branch vessels of the abdominal
aorta appear patent on this non CTA examination.

No bulky retroperitoneal, mesenteric, pelvic or inguinal
lymphadenopathy.

Reproductive: Normal appearance of the pelvic organs. No discrete
adnexal lesion. No free fluid within the pelvic cul-de-sac.

Other: Scattered foci of nodular subcutaneous edema and scattered
subcutaneous emphysema within the undersurface of the abdominal
pannus likely represents sequela of subcutaneous medication
administration.

Musculoskeletal: No acute or aggressive osseous abnormalities.
Stigmata of dish within the thoracic spine. Moderate to severe
multilevel lumbar spine DDD, worse at L3-L4 with disc space height
loss, endplate irregularity and small posteriorly directed disc
osteophyte complex at this location. Bilateral facet degenerative
change of the lower lumbar spine. Severe degenerative change the
right hip with complete joint space loss, bone-on-bone articulation,
subchondral sclerosis, osteophytosis and subchondral cyst/geode
formation.
IMPRESSION: 1. Stable sequela of previous Whipple procedure with interval
reduction in size residual fluid collection about the tail of
pancreas, currently measuring 3.3 cm, previously, 4.1 cm.
2. Small fluid collection about the tail the pancreas again appears
to communicate with a significantly decompressed fluid collection
within the left upper abdominal quadrant following percutaneous
drainage catheter exchange and repositioning. Residual fluid
collection within the left upper abdominal quadrant now measures
cm, previously, 13.1 cm.
3. Stable positioning of remaining large bore surgical drains
entering from the right lower abdomen. Interval decrease in size of
small ill-defined fluid collection about the tip of the superior
most aspect surgical drainage catheter, currently measuring 3.3 cm,
previously, 4.5 cm. No definable collection is seen along the course
of the more inferior large bore surgical drain.
4. Stable positioning of pancreatic stent. No new
definable/drainable peripancreatic fluid collections.
5. No evidence of enteric obstruction or anastomotic leak.
6. Biliary stent remains within the mid and central aspect of the
CBD. No evidence of intra or extrahepatic biliary ductal dilatation.
7. Interval reduction of persistent small left-sided pleural
effusion with associated left lower lobe opacities, atelectasis
versus infiltrate.
8. Advanced hepatic steatosis.
9. Severe degenerative change of the right hip.
10. Moderate to severe multilevel lumbar spine DDD, worse at L3-L4.

## 2021-02-08 ENCOUNTER — Other Ambulatory Visit: Payer: Self-pay | Admitting: General Surgery

## 2021-02-08 DIAGNOSIS — E041 Nontoxic single thyroid nodule: Secondary | ICD-10-CM

## 2021-03-18 ENCOUNTER — Other Ambulatory Visit (HOSPITAL_COMMUNITY): Payer: Self-pay | Admitting: General Surgery

## 2021-03-18 DIAGNOSIS — D3A8 Other benign neuroendocrine tumors: Secondary | ICD-10-CM

## 2021-04-01 ENCOUNTER — Other Ambulatory Visit: Payer: BC Managed Care – PPO

## 2021-04-01 ENCOUNTER — Ambulatory Visit (HOSPITAL_COMMUNITY): Payer: BC Managed Care – PPO

## 2021-04-01 ENCOUNTER — Ambulatory Visit
Admission: RE | Admit: 2021-04-01 | Discharge: 2021-04-01 | Disposition: A | Discharge status: Home / Self Care | Payer: Self-pay | Source: Ambulatory Visit | Attending: General Surgery | Admitting: General Surgery

## 2021-04-01 DIAGNOSIS — E041 Nontoxic single thyroid nodule: Secondary | ICD-10-CM

## 2021-04-15 ENCOUNTER — Ambulatory Visit (HOSPITAL_COMMUNITY): Payer: 59

## 2021-04-15 ENCOUNTER — Encounter (HOSPITAL_COMMUNITY): Payer: Self-pay

## 2021-04-27 ENCOUNTER — Other Ambulatory Visit: Payer: Self-pay | Admitting: General Surgery

## 2021-04-27 DIAGNOSIS — D3A8 Other benign neuroendocrine tumors: Secondary | ICD-10-CM

## 2021-05-19 ENCOUNTER — Other Ambulatory Visit (HOSPITAL_COMMUNITY): Payer: 59

## 2021-05-19 ENCOUNTER — Ambulatory Visit (HOSPITAL_COMMUNITY)
Admission: RE | Admit: 2021-05-19 | Discharge: 2021-05-19 | Disposition: A | Discharge status: Home / Self Care | Payer: 59 | Source: Ambulatory Visit | Attending: General Surgery | Admitting: General Surgery

## 2021-05-19 DIAGNOSIS — D3A8 Other benign neuroendocrine tumors: Secondary | ICD-10-CM | POA: Insufficient documentation

## 2021-05-19 MED ORDER — COPPER CU 64 DOTATATE 1 MCI/ML IV SOLN
4.0000 | Freq: Once | INTRAVENOUS | Status: AC
  Administered 2021-05-19: 4 via INTRAVENOUS

## 2021-07-22 LAB — HEPATIC FUNCTION PANEL
ALT: 41 U/L — AB (ref 7–35)
AST: 34 (ref 13–35)
Alkaline Phosphatase: 169 — AB (ref 25–125)
Bilirubin, Total: 0.2

## 2021-07-22 LAB — BASIC METABOLIC PANEL
BUN: 20 (ref 4–21)
CO2: 19 (ref 13–22)
Chloride: 102 (ref 99–108)
Creatinine: 0.8 (ref 0.5–1.1)
Glucose: 129
Potassium: 4.3 mEq/L (ref 3.5–5.1)
Sodium: 141 (ref 137–147)

## 2021-07-22 LAB — HEMOGLOBIN A1C: Hemoglobin A1C: 7.7

## 2021-07-22 LAB — COMPREHENSIVE METABOLIC PANEL
Albumin: 4.1 (ref 3.5–5.0)
Calcium: 9.3 (ref 8.7–10.7)
Globulin: 3.3

## 2021-08-30 ENCOUNTER — Ambulatory Visit (INDEPENDENT_AMBULATORY_CARE_PROVIDER_SITE_OTHER): Payer: BC Managed Care – PPO | Admitting: "Endocrinology

## 2021-08-30 ENCOUNTER — Encounter: Payer: Self-pay | Admitting: "Endocrinology

## 2021-08-30 ENCOUNTER — Ambulatory Visit: Payer: Self-pay | Admitting: Nurse Practitioner

## 2021-08-30 VITALS — BP 110/76 | HR 92 | Ht 64.0 in | Wt 198.8 lb

## 2021-08-30 DIAGNOSIS — E6609 Other obesity due to excess calories: Secondary | ICD-10-CM

## 2021-08-30 DIAGNOSIS — E042 Nontoxic multinodular goiter: Secondary | ICD-10-CM | POA: Diagnosis not present

## 2021-08-30 DIAGNOSIS — Z794 Long term (current) use of insulin: Secondary | ICD-10-CM

## 2021-08-30 DIAGNOSIS — E782 Mixed hyperlipidemia: Secondary | ICD-10-CM

## 2021-08-30 DIAGNOSIS — I10 Essential (primary) hypertension: Secondary | ICD-10-CM

## 2021-08-30 DIAGNOSIS — Z6834 Body mass index (BMI) 34.0-34.9, adult: Secondary | ICD-10-CM

## 2021-08-30 DIAGNOSIS — K760 Fatty (change of) liver, not elsewhere classified: Secondary | ICD-10-CM | POA: Insufficient documentation

## 2021-08-30 DIAGNOSIS — E1165 Type 2 diabetes mellitus with hyperglycemia: Secondary | ICD-10-CM

## 2021-08-30 NOTE — Progress Notes (Signed)
Endocrinology Consult Note       08/30/2021, 3:23 PM   Subjective:    Patient ID: Leah Farmer, female    DOB: 1965/05/09.  Leah Farmer is being seen in consultation for management of currently uncontrolled symptomatic diabetes requested by  Margy Clarks, NP.   Past Medical History:  Diagnosis Date   Anxiety    Arthritis 2020   lower back and hip; takes only OTC Motrin   Depression    mild and situational; not medicated   Diabetes mellitus without complication ([place])    unsure if type 1 or 2 , dx as an adult    GERD (gastroesophageal reflux disease)    History of kidney stones 07/2018   hospitalized at [facility]; passed on her own   Hypertension    NAFL (nonalcoholic fatty liver) 79/89/2119   fatty liver seen on CT scan abd/ pelvis   Nephrolithiasis    lov in june 2020 , also had covid at this time    pancreatic ca dx'd 08/2018   pancreatic   Pneumonia    PONV (postoperative nausea and vomiting)    Tachycardia    generally runs in the 120s    Past Surgical History:  Procedure Laterality Date   APPENDECTOMY  in 38s   CHOLECYSTECTOMY  in 20s   ESOPHAGOGASTRODUODENOSCOPY (EGD) WITH PROPOFOL N/A 11/01/2018   Procedure: ESOPHAGOGASTRODUODENOSCOPY (EGD) WITH PROPOFOL;  Surgeon: Milus Banister, MD;  Location: [facility];  Service: Endoscopy;  Laterality: N/A;   EUS N/A 11/01/2018   Procedure: UPPER ENDOSCOPIC ULTRASOUND (EUS) LINEAR;  Surgeon: Milus Banister, MD;  Location: [facility];  Service: Endoscopy;  Laterality: N/A;   FINE NEEDLE ASPIRATION N/A 11/01/2018   Procedure: FINE NEEDLE ASPIRATION (FNA) LINEAR;  Surgeon: Milus Banister, MD;  Location: [facility];  Service: Endoscopy;  Laterality: N/A;   IR CATHETER TUBE CHANGE  02/16/2019   IR RADIOLOGIST EVAL & MGMT  03/14/2019   IR THORACENTESIS ASP PLEURAL SPACE W/IMG GUIDE  01/21/2019   LAPAROSCOPY N/A 01/15/2019   Procedure:  LAPAROSCOPY DIAGNOSTIC;  Surgeon: Stark Klein, MD;  Location: [place];  Service: General;  Laterality: N/A;   WHIPPLE PROCEDURE N/A 01/15/2019   Procedure: WHIPPLE PROCEDURE;  Surgeon: Stark Klein, MD;  Location: [place];  Service: General;  Laterality: N/A;    Social History   Socioeconomic History   Marital status: Married    Spouse name: Not on file   Number of children: Not on file   Years of education: Not on file   Highest education level: Not on file  Occupational History   Not on file  Tobacco Use   Smoking status: Never   Smokeless tobacco: Never  Vaping Use   Vaping Use: Never used  Substance and Sexual Activity   Alcohol use: Never   Drug use: Never   Sexual activity: Not Currently  Other Topics Concern   Not on file  Social History Narrative   Not on file   Social Determinants of Health   Financial Resource Strain: Not on file  Food Insecurity: Not on file  Transportation Needs: Not on file  Physical  Activity: Not on file  Stress: Not on file  Social Connections: Not on file    Family History  Problem Relation Age of Onset   Hypertension Mother    Diabetes Mother    Pancreatic cancer Mother    Hypertension Father    Diabetes Father    Esophageal cancer Maternal Uncle        Passed at 63. Found colon cancer on colonoscopy at that time.    Colon cancer Neg Hx    Colon polyps Neg Hx     Outpatient Encounter Medications as of 08/30/2021  Medication Sig   insulin degludec (TRESIBA FLEXTOUCH) 100 UNIT/ML FlexTouch Pen Inject 30 Units into the skin at bedtime.   Misc Natural Products (ZARBEES ALL-IN-ONE PO) Take 3 tablets by mouth daily.   clonazePAM (KLONOPIN) 0.5 MG tablet Take 0.25-0.5 mg by mouth daily as needed for anxiety.   lisinopril (ZESTRIL) 20 MG tablet Take 1 tablet (20 mg total) by mouth daily.   meclizine (ANTIVERT) 12.5 MG tablet Take 12.5 mg by mouth 3 (three) times daily as needed for dizziness.   metoprolol succinate (TOPROL-XL) 50 MG  24 hr tablet Take 50 mg by mouth at bedtime. Take with or immediately following a meal.   ondansetron (ZOFRAN) 8 MG tablet Take 1 tablet (8 mg total) by mouth every 8 (eight) hours as needed for nausea.   oxyCODONE (OXY IR/ROXICODONE) 5 MG immediate release tablet Take 1-2 tablets (5-10 mg total) by mouth every 6 (six) hours as needed for moderate pain, severe pain or breakthrough pain.   promethazine (PHENERGAN) 12.5 MG tablet Take 12.5 mg by mouth every 6 (six) hours as needed. (Patient not taking: Reported on 02/20/2020)   rosuvastatin (CRESTOR) 10 MG tablet Take 10 mg by mouth at bedtime.   tamsulosin (FLOMAX) 0.4 MG CAPS capsule Take 0.4 mg by mouth daily as needed. For kidney stone pain   Vitamin D, Ergocalciferol, (DRISDOL) 1.25 MG (50000 UT) CAPS capsule Take 50,000 Units by mouth every Saturday.   [DISCONTINUED] acetaminophen (TYLENOL) 500 MG tablet Take 1,000 mg by mouth every 6 (six) hours as needed.   [DISCONTINUED] apixaban (ELIQUIS) 5 MG TABS tablet Take 1 tablet (5 mg total) by mouth 2 (two) times daily. (Patient not taking: Reported on 02/20/2020)   [DISCONTINUED] insulin glargine (LANTUS) 100 UNIT/ML injection Inject 0.25 mLs (25 Units total) into the skin 2 (two) times daily. (Patient taking differently: Inject 25 Units into the skin daily.)   [DISCONTINUED] loperamide (IMODIUM) 2 MG capsule Take 1 capsule (2 mg total) by mouth as needed for diarrhea or loose stools.   [DISCONTINUED] Simethicone (GAS RELIEF 125 MAX ST PO) Take 1 tablet by mouth daily as needed.   [DISCONTINUED] traMADol (ULTRAM) 50 MG tablet Take 50 mg by mouth 2 (two) times daily as needed (kidney stone pain).    Facility-Administered Encounter Medications as of 08/30/2021  Medication   sodium chloride 0.9 % 1,000 mL with thiamine 790 mg, folic acid 1 mg, multivitamins adult 10 mL infusion   sodium chloride 0.9 % 1,000 mL with thiamine 240 mg, folic acid 1 mg, multivitamins adult 10 mL infusion   sodium chloride 0.9  % bolus 1,000 mL   sodium chloride 0.9 % bolus 1,000 mL    ALLERGIES: Allergies  Allergen Reactions   Ciprofloxacin Hives    VACCINATION STATUS:  There is no immunization history on file for this patient.  Diabetes She presents for her initial diabetic visit. She has type 2 diabetes  mellitus. Onset time: She was diagnosed at approximate age of 78 years.  After Whipple surgery in December 2020, she was put on insulin. There are no hypoglycemic associated symptoms. Pertinent negatives for hypoglycemia include no confusion, headaches, pallor or seizures. There are no diabetic associated symptoms. Pertinent negatives for diabetes include no chest pain, no polydipsia, no polyphagia and no polyuria. There are no hypoglycemic complications. Symptoms are stable. (She does not report gross complications from her diabetes.  However this patient has comorbid conditions including nonalcoholic fatty liver disease, hyperlipidemia, hypertension, obesity class I) Risk factors for coronary artery disease include dyslipidemia, diabetes mellitus, obesity, sedentary lifestyle and post-menopausal. Current diabetic treatment includes insulin injections. Her weight is fluctuating minimally. She is following a generally unhealthy diet. When asked about meal planning, she reported none. She has not had a previous visit with a dietitian. She participates in exercise intermittently. Her home blood glucose trend is fluctuating minimally. (She did not bring any logs nor meter to review.  However her recent A1c was 7.7%.  She does not report hypoglycemia.) An ACE inhibitor/angiotensin II receptor blocker is being taken.  Hyperlipidemia This is a chronic problem. The current episode started more than 1 year ago. The problem is uncontrolled. Exacerbating diseases include diabetes and obesity. Pertinent negatives include no chest pain, myalgias or shortness of breath. Current antihyperlipidemic treatment includes statins. Risk  factors for coronary artery disease include dyslipidemia, diabetes mellitus, hypertension, a sedentary lifestyle and post-menopausal.  Hypertension This is a chronic problem. The current episode started more than 1 year ago. Pertinent negatives include no chest pain, headaches, palpitations or shortness of breath. Risk factors for coronary artery disease include dyslipidemia, diabetes mellitus, obesity, sedentary lifestyle and post-menopausal state. Past treatments include ACE inhibitors.     Review of Systems  Constitutional:  Negative for chills, fever and unexpected weight change.  HENT:  Negative for trouble swallowing and voice change.   Eyes:  Negative for visual disturbance.  Respiratory:  Negative for cough, shortness of breath and wheezing.   Cardiovascular:  Negative for chest pain, palpitations and leg swelling.  Gastrointestinal:  Negative for diarrhea, nausea and vomiting.  Endocrine: Negative for cold intolerance, heat intolerance, polydipsia, polyphagia and polyuria.  Musculoskeletal:  Negative for arthralgias and myalgias.  Skin:  Negative for color change, pallor, rash and wound.  Neurological:  Negative for seizures and headaches.       Right foot drop as of the time of her Whipple surgery.  Psychiatric/Behavioral:  Negative for confusion and suicidal ideas.     Objective:       08/30/2021    1:21 PM 10/01/2019   10:52 AM 05/13/2019    2:24 PM  Vitals with BMI  Height '5\' 4"'$  '5\' 4"'$  '5\' 4"'$   Weight 198 lbs 13 oz 201 lbs 13 oz 206 lbs  BMI 34.11 26.94 85.46  Systolic 270 350 093  Diastolic 76 78 84  Pulse 92 95     BP 110/76   Pulse 92   Ht '5\' 4"'$  (1.626 m)   Wt 198 lb 12.8 oz (90.2 kg)   BMI 34.12 kg/m   Wt Readings from Last 3 Encounters:  08/30/21 198 lb 12.8 oz (90.2 kg)  10/01/19 201 lb 12.8 oz (91.5 kg)  05/13/19 206 lb (93.4 kg)     Physical Exam Constitutional:      Appearance: She is well-developed.  HENT:     Head: Normocephalic and atraumatic.   Neck:     Thyroid: No  thyromegaly.     Trachea: No tracheal deviation.  Cardiovascular:     Rate and Rhythm: Normal rate and regular rhythm.  Pulmonary:     Effort: Pulmonary effort is normal.     Breath sounds: Normal breath sounds.  Abdominal:     General: Bowel sounds are normal.     Palpations: Abdomen is soft.     Tenderness: There is no abdominal tenderness. There is no guarding.  Musculoskeletal:        General: Normal range of motion.     Cervical back: Normal range of motion and neck supple.  Skin:    General: Skin is warm and dry.     Coloration: Skin is not pale.     Findings: No erythema or rash.  Neurological:     Mental Status: She is alert and oriented to person, place, and time.     Cranial Nerves: No cranial nerve deficit.     Coordination: Coordination normal.     Deep Tendon Reflexes: Reflexes are normal and symmetric.     Comments: No gross motor deficit, patient walks with a cane.  Psychiatric:        Judgment: Judgment normal.       CMP ( most recent) CMP     Component Value Date/Time   NA 141 07/22/2021 0000   K 4.3 07/22/2021 0000   CL 102 07/22/2021 0000   CO2 19 07/22/2021 0000   GLUCOSE 76 02/25/2019 0505   BUN 20 07/22/2021 0000   CREATININE 0.8 07/22/2021 0000   CREATININE 0.94 02/25/2019 0505   CALCIUM 9.3 07/22/2021 0000   PROT 5.8 (L) 02/25/2019 0505   ALBUMIN 4.1 07/22/2021 0000   AST 34 07/22/2021 0000   ALT 41 (A) 07/22/2021 0000   ALKPHOS 169 (A) 07/22/2021 0000   BILITOT 0.2 (L) 02/25/2019 0505   GFRNONAA >60 02/25/2019 0505   GFRAA >60 02/25/2019 0505     Diabetic Labs (most recent): Lab Results  Component Value Date   HGBA1C 7.7 07/22/2021   HGBA1C 7.5 (H) 01/11/2019     Lipid Panel ( most recent) Lipid Panel     Component Value Date/Time   TRIG 130 01/29/2019 0542     Assessment & Plan:   1. Type 2 diabetes mellitus with hyperglycemia, with long-term current use of insulin ([place])   - Breely Panik  has currently uncontrolled symptomatic type 2 DM since  56 years of age,  with most recent A1c of 7.7 %. Recent labs reviewed. - I had a long discussion with her about the progressive nature of diabetes and the pathology behind its complications. -her diabetes is complicated by nonalcoholic fatty liver disease, obesity/sedentary life, comorbid hyperlipidemia, hypertension, history of pancreatic cancer status post pancreatic resection in December 2020 and she remains at a high risk for more acute and chronic complications which include CAD, CVA, CKD, retinopathy, and neuropathy. These are all discussed in detail with her.  - I discussed all available options of managing her diabetes including de-escalation of medications. I have counseled her on diet  and weight management  by adopting a Whole Food , Plant Predominant  ( WFPP) nutrition as recommended by [employer] of Lifestyle Medicine. Patient is encouraged to switch to  unprocessed or minimally processed  complex starch, adequate protein intake (mainly plant source), minimal liquid fat ( mainly vegetable oils), plenty of fruits, and vegetables. -  she is advised to stick to a routine mealtimes to eat 3 complete meals a  day and snack only when necessary ( to snack only to correct hypoglycemia BG <70 day time or <100 at night).   - she acknowledges that there is a room for improvement in her food and drink choices. - Further Specific Suggestion is made for her to avoid simple carbohydrates  from her diet including Cakes, Sweet Desserts, Ice Cream, Soda (diet and regular), Sweet Tea, Candies, Chips, Cookies, Store Bought Juices, Alcohol ,  Artificial Sweeteners,  Coffee Creamer, and "Sugar-free" Products. This will help patient to have more stable blood glucose profile and potentially avoid unintended weight gain.  The following Lifestyle Medicine recommendations according to [facility]) were discussed and offered to  patient and she agrees to start the journey:  A. Whole Foods, Plant-based plate comprising of fruits and vegetables, plant-based proteins, whole-grain carbohydrates was discussed in detail with the patient.   A list for source of those nutrients were also provided to the patient.  Patient will use only water or unsweetened tea for hydration. B.  The need to stay away from risky substances including alcohol, smoking; obtaining 7 to 9 hours of restorative sleep, at least 150 minutes of moderate intensity exercise weekly, the importance of healthy social connections,  and stress reduction techniques were discussed. C.  A full color page of  Calorie density of various food groups per pound showing examples of each food groups was provided to the patient.  - she will be scheduled with Jearld Fenton, RDN, CDE for individualized diabetes education.  - I have approached her with the following plan to manage  her diabetes and patient agrees:   -Based on her presentation with A1c of 7.5-7.7%, she will not need prandial insulin for now.  I discussed and increase her Tresiba to 30 units and switch her to nightly.  She is advised to monitor blood glucose twice a day-daily before breakfast and at bedtime until she returns for a follow-up visit in 9 weeks.   - she is warned not to take insulin without proper monitoring per orders.  - she is encouraged to call clinic for blood glucose levels less than 70 or above 200 mg /dl 3 days a week at fasting. -She is not a candidate for non-insulin treatment options.  - Specific targets for  A1c;  LDL, HDL,  and Triglycerides were discussed with the patient.  2) Blood Pressure /Hypertension:  her blood pressure is  controlled to target.   she is advised to continue her current medications including lisinopril 20 mg p.o. daily, metoprolol 50 mg p.o. daily with breakfast . 3) Lipids/Hyperlipidemia: No recent lipid panel to review.  She is advised to continue Crestor 10 mg  p.o. nightly.  Whole food plant-based diet described above is highly recommended for her to achieve control of her lipid panel in light of her medical history of nonalcoholic fatty liver disease (metabolic dysfunction associated steatotic liver disease).   4)  Weight/Diet:  Body mass index is 34.12 kg/m.  -   clearly complicating her diabetes care.   she is  a candidate for weight loss. I discussed with her the fact that loss of 5 - 10% of her  current body weight will have the most impact on her diabetes management.  The above detailed  ACLM recommendations for nutrition, exercise, sleep, social life, avoidance of risky substances, the need for restorative sleep   information will also detailed on discharge instructions.   5) multi-nodular goiter: I reviewed her recent thyroid  ultrasound which showed 1.1 cm right inferior thyroid nodule, and 1.1 cm left inferior thyroid nodule.  This ultrasound was from April 01, 2021.  She reports that she already has a plan to repeat thyroid ultrasound via her primary care doctor.  We will request for a copy of the study.  6) Chronic Care/Health Maintenance:  -she  is on ACEI/ARB and Statin medications and  is encouraged to initiate and continue to follow up with Ophthalmology, Dentist,  Podiatrist at least yearly or according to recommendations, and advised to   stay away from smoking. I have recommended yearly flu vaccine and pneumonia vaccine at least every 5 years; moderate intensity exercise for up to 150 minutes weekly; and  sleep for 7- 9 hours a day.  - she is  advised to maintain close follow up with Margy Clarks, NP for primary care needs, as well as her other providers for optimal and coordinated care.   I spent 61 minutes in the care of the patient today including review of labs from [place], Lipids, Thyroid Function, Hematology (current and previous including abstractions from other facilities); face-to-face time discussing  her blood glucose  readings/logs, discussing hypoglycemia and hyperglycemia episodes and symptoms, medications doses, her options of short and long term treatment based on the latest standards of care / guidelines;  discussion about incorporating lifestyle medicine;  and documenting the encounter. Risk reduction counseling performed per USPSTF guidelines to reduce obesity and cardiovascular risk factors.      Please refer to Patient Instructions for Blood Glucose Monitoring and Insulin/Medications Dosing Guide"  in media tab for additional information. Please  also refer to " Patient Self Inventory" in the Media  tab for reviewed elements of pertinent patient history.  Vernell Morgans participated in the discussions, expressed understanding, and voiced agreement with the above plans.  All questions were answered to her satisfaction. she is encouraged to contact clinic should she have any questions or concerns prior to her return visit.   Follow up plan: - Return in about 9 weeks (around 11/01/2021) for Bring Meter/CGM Device/Logs- A1c in Office, [place] with Aysel Gilchrest.  Glade Lloyd, MD [facility] [address] [place], [place] [address] Phone: [phone]  Fax: [phone]    08/30/2021, 3:23 PM  This note was partially dictated with voice recognition software. Similar sounding words can be transcribed inadequately or may not  be corrected upon review.

## 2021-08-30 NOTE — Patient Instructions (Signed)
                                     Advice for Weight Management  -For most of us the best way to lose weight is by diet management. Generally speaking, diet management means consuming less calories intentionally which over time brings about progressive weight loss.  This can be achieved more effectively by avoiding ultra processed carbohydrates, processed meats, unhealthy fats.    It is critically important to know your numbers: how much calorie you are consuming and how much calorie you need. More importantly, our carbohydrates sources should be unprocessed naturally occurring  complex starch food items.  It is always important to balance nutrition also by  appropriate intake of proteins (mainly plant-based), healthy fats/oils, plenty of fruits and vegetables.   -The American College of Lifestyle Medicine (ACL M) recommends nutrition derived mostly from Whole Food, Plant Predominant Sources example an apple instead of applesauce or apple pie. Eat Plenty of vegetables, Mushrooms, fruits, Legumes, Whole Grains, Nuts, seeds in lieu of processed meats, processed snacks/pastries red meat, poultry, eggs.  Use only water or unsweetened tea for hydration.  The College also recommends the need to stay away from risky substances including alcohol, smoking; obtaining 7-9 hours of restorative sleep, at least 150 minutes of moderate intensity exercise weekly, importance of healthy social connections, and being mindful of stress and seek help when it is overwhelming.    -Sticking to a routine mealtime to eat 3 meals a day and avoiding unnecessary snacks is shown to have a big role in weight control. Under normal circumstances, the only time we burn stored energy is when we are hungry, so allow  some hunger to take place- hunger means no food between appropriate meal times, only water.  It is not advisable to starve.   -It is better to avoid simple carbohydrates including:  Cakes, Sweet Desserts, Ice Cream, Soda (diet and regular), Sweet Tea, Candies, Chips, Cookies, Store Bought Juices, Alcohol in Excess of  1-2 drinks a day, Lemonade,  Artificial Sweeteners, Doughnuts, Coffee Creamers, "Sugar-free" Products, etc, etc.  This is not a complete list.....    -Consulting with certified diabetes educators is proven to provide you with the most accurate and current information on diet.  Also, you may be  interested in discussing diet options/exchanges , we can schedule a visit with Leah Farmer, RDN, CDE for individualized nutrition education.  -Exercise: If you are able: 30 -60 minutes a day ,4 days a week, or 150 minutes of moderate intensity exercise weekly.    The longer the better if tolerated.  Combine stretch, strength, and aerobic activities.  If you were told in the past that you have high risk for cardiovascular diseases, or if you are currently symptomatic, you may seek evaluation by your heart doctor prior to initiating moderate to intense exercise programs.                                  Additional Care Considerations for Diabetes/Prediabetes   -Diabetes  is a chronic disease.  The most important care consideration is regular follow-up with your diabetes care provider with the goal being avoiding or delaying its complications and to take advantage of advances in medications and technology.  If appropriate actions are taken early enough, type 2 diabetes can even be   reversed.  Seek information from the right source.  - Whole Food, Plant Predominant Nutrition is highly recommended: Eat Plenty of vegetables, Mushrooms, fruits, Legumes, Whole Grains, Nuts, seeds in lieu of processed meats, processed snacks/pastries red meat, poultry, eggs as recommended by American College of  Lifestyle Medicine (ACLM).  -Type 2 diabetes is known to coexist with other important comorbidities such as high blood pressure and high cholesterol.  It is critical to control not only the  diabetes but also the high blood pressure and high cholesterol to minimize and delay the risk of complications including coronary artery disease, stroke, amputations, blindness, etc.  The good news is that this diet recommendation for type 2 diabetes is also very helpful for managing high cholesterol and high blood blood pressure.  - Studies showed that people with diabetes will benefit from a class of medications known as ACE inhibitors and statins.  Unless there are specific reasons not to be on these medications, the standard of care is to consider getting one from these groups of medications at an optimal doses.  These medications are generally considered safe and proven to help protect the heart and the kidneys.    - People with diabetes are encouraged to initiate and maintain regular follow-up with eye doctors, foot doctors, dentists , and if necessary heart and kidney doctors.     - It is highly recommended that people with diabetes quit smoking or stay away from smoking, and get yearly  flu vaccine and pneumonia vaccine at least every 5 years.  See above for additional recommendations on exercise, sleep, stress management , and healthy social connections.      

## 2021-10-13 ENCOUNTER — Encounter: Payer: Medicare Other | Attending: Family Medicine | Admitting: Nutrition

## 2021-10-13 ENCOUNTER — Encounter: Payer: Self-pay | Admitting: Nutrition

## 2021-10-13 DIAGNOSIS — E6609 Other obesity due to excess calories: Secondary | ICD-10-CM | POA: Insufficient documentation

## 2021-10-13 DIAGNOSIS — Z6834 Body mass index (BMI) 34.0-34.9, adult: Secondary | ICD-10-CM | POA: Insufficient documentation

## 2021-10-13 DIAGNOSIS — E1165 Type 2 diabetes mellitus with hyperglycemia: Secondary | ICD-10-CM | POA: Insufficient documentation

## 2021-10-13 DIAGNOSIS — I10 Essential (primary) hypertension: Secondary | ICD-10-CM | POA: Diagnosis present

## 2021-10-13 DIAGNOSIS — Z794 Long term (current) use of insulin: Secondary | ICD-10-CM | POA: Diagnosis present

## 2021-10-13 DIAGNOSIS — E782 Mixed hyperlipidemia: Secondary | ICD-10-CM | POA: Insufficient documentation

## 2021-10-13 DIAGNOSIS — C250 Malignant neoplasm of head of pancreas: Secondary | ICD-10-CM | POA: Insufficient documentation

## 2021-10-13 DIAGNOSIS — K760 Fatty (change of) liver, not elsewhere classified: Secondary | ICD-10-CM | POA: Diagnosis present

## 2021-10-13 NOTE — Progress Notes (Signed)
Medical Nutrition Therapy  Appointment Start time:  0930  Appointment End time:  58  Primary concerns today: DM Type 2  Referral diagnosis: E11.8  Preferred learning style: no Preference  Learning readiness: Changes in progress    NUTRITION ASSESSMENT  54 y old wfemale here with her husband. She sees Dr. Dorris Fetch, Endocrinology. Had pancreas surgery 3 years ago-whipple procedure due to pancreatic cancer.  Currently taking 30 units of Tresiba   FBS 97-127 mg/dl Bedtime: 160-190's. A1C 7.7%  Changes made: She has been following lifestyle medicine per Dr. Dorris Fetch. She has cut out a lot of processed foods, animal products and increasing her intake of fruits, vegetables and whole grains. Struggles giving up her cheese. She feels a lot better eating more plant based foods. Making progress with changing her behaviors and lifestyle.  Anthropometrics  Wt Readings from Last 3 Encounters:  08/30/21 198 lb 12.8 oz (90.2 kg)  10/01/19 201 lb 12.8 oz (91.5 kg)  05/13/19 206 lb (93.4 kg)   Ht Readings from Last 3 Encounters:  08/30/21 '5\' 4"'$  (1.626 m)  10/01/19 '5\' 4"'$  (1.626 m)  05/13/19 '5\' 4"'$  (1.626 m)   There is no height or weight on file to calculate BMI. '@BMIFA'$ @ Facility age limit for growth %iles is 20 years. Facility age limit for growth %iles is 20 years.    Clinical Medical Hx: See chart. Pancreatic cancer, HTN, Fatty liver disease Medications: Tresiba 30 units per day. Labs:  Lab Results  Component Value Date   HGBA1C 7.7 07/22/2021      Latest Ref Rng & Units 07/22/2021   12:00 AM 02/25/2019    5:05 AM 02/24/2019    5:34 AM  CMP  Glucose 70 - 99 mg/dL  76  81   BUN 4 - '21 20     8  10   '$ Creatinine 0.5 - 1.1 0.8     0.94  0.84   Sodium 137 - 147 141     142  144   Potassium 3.5 - 5.1 mEq/L 4.3     3.7  3.2   Chloride 99 - 108 102     107  110   CO2 13 - '22 19     24  23   '$ Calcium 8.7 - 10.7 9.3     8.4  8.6   Total Protein 6.5 - 8.1 g/dL  5.8    Total Bilirubin 0.3 -  1.2 mg/dL  0.2    Alkaline Phos 25 - 125 169     58    AST 13 - 35 34     15    ALT 7 - 35 U/L 41     14       This result is from an external source.   Lipid Panel     Component Value Date/Time   TRIG 130 01/29/2019 0542    Notable Signs/Symptoms: None  Lifestyle & Dietary Hx Lives with husband.  Estimated daily fluid intake: 60 oz Supplements:  Sleep: 4/5 Stress / self-care: her cancer and health issues Current average weekly physical activity: Walks  24-Hr Dietary Recall First Meal: cheese toast keto bread or eggs, Snack:  Second Meal: apple, pb natural, water or salad with o/v Snack:  Third Meal: chicken lemon, carrots  water Snack:  Beverages: water  Estimated Energy Needs Calories: 1200-1400 Carbohydrate: 135g Protein: 90g Fat: 33g   NUTRITION DIAGNOSIS  NB-1.1 Food and nutrition-related knowledge deficit As related to DIabetes Type 2.  As  evidenced by A1C 7.7%..   NUTRITION INTERVENTION  Nutrition education (E-1) on the following topics:  Nutrition and Diabetes education provided on My Plate, CHO counting, meal planning, portion sizes, timing of meals, avoiding snacks between meals unless having a low blood sugar, target ranges for A1C and blood sugars, signs/symptoms and treatment of hyper/hypoglycemia, monitoring blood sugars, taking medications as prescribed, benefits of exercising 30 minutes per day and prevention of complications of DM. Lifestyle Medicine  - Whole Food, Plant Predominant Nutrition is highly recommended: Eat Plenty of vegetables, Mushrooms, fruits, Legumes, Whole Grains, Nuts, seeds in lieu of processed meats, processed snacks/pastries red meat, poultry, eggs.    -It is better to avoid simple carbohydrates including: Cakes, Sweet Desserts, Ice Cream, Soda (diet and regular), Sweet Tea, Candies, Chips, Cookies, Store Bought Juices, Alcohol in Excess of  1-2 drinks a day, Lemonade,  Artificial Sweeteners, Doughnuts, Coffee Creamers,  "Sugar-free" Products, etc, etc.  This is not a complete list.....  Exercise: If you are able: 30 -60 minutes a day ,4 days a week, or 150 minutes a week.  The longer the better.  Combine stretch, strength, and aerobic activities.  If you were told in the past that you have high risk for cardiovascular diseases, you may seek evaluation by your heart doctor prior to initiating moderate to intense exercise programs.   Handouts Provided Include  Lifestyle Medicine Meal Plan Card  Learning Style & Readiness for Change Teaching method utilized: Visual & Auditory  Demonstrated degree of understanding via: Teach Back  Barriers to learning/adherence to lifestyle change: None  Goals Established by Pt Goals  Increase variety of fruits and vegetables Get in 30 g CHO at each meals Keep drinking water Keep reading labs. Get A1C down to 7% or less.   MONITORING & EVALUATION Dietary intake, weekly physical activity, and blood sugars  in 1- 2 months.  Next Steps  Patient is to work on eating more whole plant based foods.Marland Kitchen

## 2021-10-13 NOTE — Patient Instructions (Addendum)
Goals  Increase variety of fruits and vegetables Get in 30 g CHO at each meals Keep drinking water Keep reading labs. Get A1C down to 7% or less.

## 2021-10-18 ENCOUNTER — Ambulatory Visit: Payer: 59 | Admitting: Nutrition

## 2021-11-01 ENCOUNTER — Encounter: Payer: Self-pay | Admitting: "Endocrinology

## 2021-11-01 ENCOUNTER — Encounter: Payer: Medicare Other | Attending: Family Medicine | Admitting: Nutrition

## 2021-11-01 ENCOUNTER — Ambulatory Visit (INDEPENDENT_AMBULATORY_CARE_PROVIDER_SITE_OTHER): Payer: BC Managed Care – PPO | Admitting: "Endocrinology

## 2021-11-01 VITALS — BP 108/62 | HR 80 | Ht 64.0 in | Wt 194.2 lb

## 2021-11-01 VITALS — Ht 64.0 in | Wt 194.0 lb

## 2021-11-01 DIAGNOSIS — Z794 Long term (current) use of insulin: Secondary | ICD-10-CM

## 2021-11-01 DIAGNOSIS — E1165 Type 2 diabetes mellitus with hyperglycemia: Secondary | ICD-10-CM | POA: Insufficient documentation

## 2021-11-01 DIAGNOSIS — K760 Fatty (change of) liver, not elsewhere classified: Secondary | ICD-10-CM | POA: Insufficient documentation

## 2021-11-01 DIAGNOSIS — C250 Malignant neoplasm of head of pancreas: Secondary | ICD-10-CM | POA: Insufficient documentation

## 2021-11-01 DIAGNOSIS — E042 Nontoxic multinodular goiter: Secondary | ICD-10-CM

## 2021-11-01 DIAGNOSIS — Z6834 Body mass index (BMI) 34.0-34.9, adult: Secondary | ICD-10-CM | POA: Insufficient documentation

## 2021-11-01 DIAGNOSIS — I10 Essential (primary) hypertension: Secondary | ICD-10-CM

## 2021-11-01 DIAGNOSIS — E782 Mixed hyperlipidemia: Secondary | ICD-10-CM | POA: Insufficient documentation

## 2021-11-01 DIAGNOSIS — E6609 Other obesity due to excess calories: Secondary | ICD-10-CM | POA: Diagnosis present

## 2021-11-01 LAB — POCT GLYCOSYLATED HEMOGLOBIN (HGB A1C): HbA1c, POC (controlled diabetic range): 7.2 % — AB (ref 0.0–7.0)

## 2021-11-01 MED ORDER — TRESIBA FLEXTOUCH 100 UNIT/ML ~~LOC~~ SOPN: 30.0000 [IU] | PEN_INJECTOR | Freq: Every day | SUBCUTANEOUS | 1 refills | Status: DC

## 2021-11-01 MED ORDER — FREESTYLE LIBRE 2 READER DEVI: 0 refills | Status: DC

## 2021-11-01 MED ORDER — FREESTYLE LIBRE 2 SENSOR MISC: 1.0000 | 3 refills | Status: DC

## 2021-11-01 NOTE — Progress Notes (Unsigned)
Medical Nutrition Therapy  Appointment Start time:  38  Appointment End time:  1100  Primary concerns today: DM Type 2  Referral diagnosis: E11.8  Preferred learning style: no Preference  Learning readiness: Changes in progress    NUTRITION ASSESSMENT Follow up 75 y old wfemale here with her husband. She sees Dr. Dorris Fetch, Endocrinology. Had pancreas surgery 3 years ago-whipple procedure due to pancreatic cancer.  Currently taking 30 units of Tresiba  FBS: usually 90's less than 120's  Bedtime: 150-220's.  Sleeping better, GI issues are improved. Joint pain hasn't improved. She is using the stationary bike 3-4 times per week. Lost 5 lbs. Husband has had some healthy weight loss also. Feels much better.  Making progress with changing her behaviors and lifestyle. A1C down from 7.7 to 7.2% in the last 4 months.  Anthropometrics  Wt Readings from Last 3 Encounters:  08/30/21 198 lb 12.8 oz (90.2 kg)  10/01/19 201 lb 12.8 oz (91.5 kg)  05/13/19 206 lb (93.4 kg)   Ht Readings from Last 3 Encounters:  08/30/21 '5\' 4"'$  (1.626 m)  10/01/19 '5\' 4"'$  (1.626 m)  05/13/19 '5\' 4"'$  (1.626 m)   There is no height or weight on file to calculate BMI. '@BMIFA'$ @ Facility age limit for growth %iles is 20 years. Facility age limit for growth %iles is 20 years.    Clinical Medical Hx: See chart. Pancreatic cancer, HTN, Fatty liver disease Medications: Tresiba 30 units per day. Labs:  Lab Results  Component Value Date   HGBA1C 7.2 (A) 11/01/2021        Latest Ref Rng & Units 07/22/2021   12:00 AM 02/25/2019    5:05 AM 02/24/2019    5:34 AM  CMP  Glucose 70 - 99 mg/dL  76  81   BUN 4 - '21 20     8  10   '$ Creatinine 0.5 - 1.1 0.8     0.94  0.84   Sodium 137 - 147 141     142  144   Potassium 3.5 - 5.1 mEq/L 4.3     3.7  3.2   Chloride 99 - 108 102     107  110   CO2 13 - '22 19     24  23   '$ Calcium 8.7 - 10.7 9.3     8.4  8.6   Total Protein 6.5 - 8.1 g/dL  5.8    Total Bilirubin 0.3 - 1.2  mg/dL  0.2    Alkaline Phos 25 - 125 169     58    AST 13 - 35 34     15    ALT 7 - 35 U/L 41     14       This result is from an external source.   Lipid Panel     Component Value Date/Time   TRIG 130 01/29/2019 0542    Notable Signs/Symptoms: None  Lifestyle & Dietary Hx Lives with husband.  Estimated daily fluid intake: 60 oz Supplements:  Sleep: 4/5 Stress / self-care: her cancer and health issues Current average weekly physical activity: Walks  24-Hr Dietary Recall First Meal: Keto bread, cheese, greek yogurt and fruit,  or egg whites,  Second Meal: Apple with pb, water Snack:  Third Meal: chicken and vegetables, water Snack:  Beverages: water  Estimated Energy Needs Calories: 1200-1400 Carbohydrate: 135g Protein: 90g Fat: 33g   NUTRITION DIAGNOSIS  NB-1.1 Food and nutrition-related knowledge deficit As related to DIabetes Type  2.  As evidenced by A1C 7.7%..   NUTRITION INTERVENTION  Nutrition education (E-1) on the following topics:  Nutrition and Diabetes education provided on My Plate, CHO counting, meal planning, portion sizes, timing of meals, avoiding snacks between meals unless having a low blood sugar, target ranges for A1C and blood sugars, signs/symptoms and treatment of hyper/hypoglycemia, monitoring blood sugars, taking medications as prescribed, benefits of exercising 30 minutes per day and prevention of complications of DM. Lifestyle Medicine  - Whole Food, Plant Predominant Nutrition is highly recommended: Eat Plenty of vegetables, Mushrooms, fruits, Legumes, Whole Grains, Nuts, seeds in lieu of processed meats, processed snacks/pastries red meat, poultry, eggs.    -It is better to avoid simple carbohydrates including: Cakes, Sweet Desserts, Ice Cream, Soda (diet and regular), Sweet Tea, Candies, Chips, Cookies, Store Bought Juices, Alcohol in Excess of  1-2 drinks a day, Lemonade,  Artificial Sweeteners, Doughnuts, Coffee Creamers, "Sugar-free"  Products, etc, etc.  This is not a complete list.....  Exercise: If you are able: 30 -60 minutes a day ,4 days a week, or 150 minutes a week.  The longer the better.  Combine stretch, strength, and aerobic activities.  If you were told in the past that you have high risk for cardiovascular diseases, you may seek evaluation by your heart doctor prior to initiating moderate to intense exercise programs.   Handouts Provided Include  Lifestyle Medicine Meal Plan Card  Learning Style & Readiness for Change Teaching method utilized: Visual & Auditory  Demonstrated degree of understanding via: Teach Back  Barriers to learning/adherence to lifestyle change: None  Goals Established by Pt  Keep working on breakfast options. Keep exercising. Get A1C to 7%    MONITORING & EVALUATION Dietary intake, weekly physical activity, and blood sugars  in 1- 2 months.  Next Steps  Patient is to work on eating more whole plant based foods.Marland Kitchen

## 2021-11-01 NOTE — Progress Notes (Signed)
11/01/2021, 3:35 PM   Subjective:    Patient ID: Leah Farmer, female    DOB: 1965/05/15.  Mozetta Murfin is being seen in follow-up after she was seen in consultation for management of currently uncontrolled symptomatic diabetes requested by  Margy Clarks, NP.   Past Medical History:  Diagnosis Date   Anxiety    Arthritis 2020   lower back and hip; takes only OTC Motrin   Depression    mild and situational; not medicated   Diabetes mellitus without complication ([place])    unsure if type 1 or 2 , dx as an adult    GERD (gastroesophageal reflux disease)    History of kidney stones 07/2018   hospitalized at [facility]; passed on her own   Hypertension    NAFL (nonalcoholic fatty liver) 38/25/0539   fatty liver seen on CT scan abd/ pelvis   Nephrolithiasis    lov in june 2020 , also had covid at this time    pancreatic ca dx'd 08/2018   pancreatic   Pneumonia    PONV (postoperative nausea and vomiting)    Tachycardia    generally runs in the 120s    Past Surgical History:  Procedure Laterality Date   APPENDECTOMY  in 76s   CHOLECYSTECTOMY  in 20s   ESOPHAGOGASTRODUODENOSCOPY (EGD) WITH PROPOFOL N/A 11/01/2018   Procedure: ESOPHAGOGASTRODUODENOSCOPY (EGD) WITH PROPOFOL;  Surgeon: Milus Banister, MD;  Location: [facility];  Service: Endoscopy;  Laterality: N/A;   EUS N/A 11/01/2018   Procedure: UPPER ENDOSCOPIC ULTRASOUND (EUS) LINEAR;  Surgeon: Milus Banister, MD;  Location: [facility];  Service: Endoscopy;  Laterality: N/A;   FINE NEEDLE ASPIRATION N/A 11/01/2018   Procedure: FINE NEEDLE ASPIRATION (FNA) LINEAR;  Surgeon: Milus Banister, MD;  Location: [facility];  Service: Endoscopy;  Laterality: N/A;   IR CATHETER TUBE CHANGE  02/16/2019   IR RADIOLOGIST EVAL & MGMT  03/14/2019   IR THORACENTESIS ASP PLEURAL SPACE W/IMG GUIDE  01/21/2019   LAPAROSCOPY N/A 01/15/2019   Procedure: LAPAROSCOPY DIAGNOSTIC;  Surgeon: Stark Klein, MD;   Location: [place];  Service: General;  Laterality: N/A;   WHIPPLE PROCEDURE N/A 01/15/2019   Procedure: WHIPPLE PROCEDURE;  Surgeon: Stark Klein, MD;  Location: [place];  Service: General;  Laterality: N/A;    Social History   Socioeconomic History   Marital status: Married    Spouse name: Not on file   Number of children: Not on file   Years of education: Not on file   Highest education level: Not on file  Occupational History   Not on file  Tobacco Use   Smoking status: Never   Smokeless tobacco: Never  Vaping Use   Vaping Use: Never used  Substance and Sexual Activity   Alcohol use: Never   Drug use: Never   Sexual activity: Not Currently  Other Topics Concern   Not on file  Social History Narrative   Not on file   Social Determinants of Health   Financial Resource Strain: Not on file  Food Insecurity: Not on file  Transportation Needs: Not on file  Physical Activity: Not on file  Stress: Not on file  Social Connections: Not on file    Family History  Problem Relation Age of Onset   Hypertension Mother    Diabetes Mother    Pancreatic cancer Mother    Hypertension Father    Diabetes Father    Esophageal cancer Maternal Uncle  Passed at 20. Found colon cancer on colonoscopy at that time.    Colon cancer Neg Hx    Colon polyps Neg Hx     Outpatient Encounter Medications as of 11/01/2021  Medication Sig   Continuous Blood Gluc Receiver (FREESTYLE LIBRE 2 READER) DEVI As directed   Continuous Blood Gluc Sensor (FREESTYLE LIBRE 2 SENSOR) MISC 1 Piece by Does not apply route every 14 (fourteen) days.   clonazePAM (KLONOPIN) 0.5 MG tablet Take 0.25-0.5 mg by mouth daily as needed for anxiety.   insulin degludec (TRESIBA FLEXTOUCH) 100 UNIT/ML FlexTouch Pen Inject 30 Units into the skin at bedtime.   lisinopril (ZESTRIL) 20 MG tablet Take 1 tablet (20 mg total) by mouth daily.   meclizine (ANTIVERT) 12.5 MG tablet Take 12.5 mg by mouth 3 (three) times daily as  needed for dizziness.   metoprolol succinate (TOPROL-XL) 50 MG 24 hr tablet Take 50 mg by mouth at bedtime. Take with or immediately following a meal.   Misc Natural Products (ZARBEES ALL-IN-ONE PO) Take 3 tablets by mouth daily.   ondansetron (ZOFRAN) 8 MG tablet Take 1 tablet (8 mg total) by mouth every 8 (eight) hours as needed for nausea.   oxyCODONE (OXY IR/ROXICODONE) 5 MG immediate release tablet Take 1-2 tablets (5-10 mg total) by mouth every 6 (six) hours as needed for moderate pain, severe pain or breakthrough pain.   promethazine (PHENERGAN) 12.5 MG tablet Take 12.5 mg by mouth every 6 (six) hours as needed. (Patient not taking: Reported on 02/20/2020)   rosuvastatin (CRESTOR) 10 MG tablet Take 10 mg by mouth at bedtime.   tamsulosin (FLOMAX) 0.4 MG CAPS capsule Take 0.4 mg by mouth daily as needed. For kidney stone pain   Vitamin D, Ergocalciferol, (DRISDOL) 1.25 MG (50000 UT) CAPS capsule Take 50,000 Units by mouth every Saturday.   [DISCONTINUED] insulin degludec (TRESIBA FLEXTOUCH) 100 UNIT/ML FlexTouch Pen Inject 30 Units into the skin at bedtime.   Facility-Administered Encounter Medications as of 11/01/2021  Medication   sodium chloride 0.9 % 1,000 mL with thiamine 443 mg, folic acid 1 mg, multivitamins adult 10 mL infusion   sodium chloride 0.9 % 1,000 mL with thiamine 154 mg, folic acid 1 mg, multivitamins adult 10 mL infusion   sodium chloride 0.9 % bolus 1,000 mL   sodium chloride 0.9 % bolus 1,000 mL    ALLERGIES: Allergies  Allergen Reactions   Ciprofloxacin Hives    VACCINATION STATUS:  There is no immunization history on file for this patient.  Diabetes She presents for her follow-up diabetic visit. She has type 2 diabetes mellitus. Onset time: She was diagnosed at approximate age of 48 years.  After Whipple surgery in December 2020, she was put on insulin. Her disease course has been improving. There are no hypoglycemic associated symptoms. Pertinent negatives  for hypoglycemia include no confusion, headaches, pallor or seizures. There are no diabetic associated symptoms. Pertinent negatives for diabetes include no chest pain, no polydipsia, no polyphagia and no polyuria. There are no hypoglycemic complications. Symptoms are improving. (She does not report gross complications from her diabetes.  However this patient has comorbid conditions including nonalcoholic fatty liver disease, hyperlipidemia, hypertension, obesity class I) Risk factors for coronary artery disease include dyslipidemia, diabetes mellitus, obesity, sedentary lifestyle and post-menopausal. Current diabetic treatment includes insulin injections. Her weight is decreasing steadily. She is following a generally unhealthy diet. When asked about meal planning, she reported none. She has not had a previous visit with a dietitian.  She participates in exercise intermittently. Her home blood glucose trend is fluctuating minimally. Her breakfast blood glucose range is generally 110-130 mg/dl. Her bedtime blood glucose range is generally 140-180 mg/dl. Her overall blood glucose range is 140-180 mg/dl. (She presents with controlled glycemic profile.  Did not bring any logs nor meter to review.  However her recent A1c was 7.7%.  She does not report hypoglycemia.) An ACE inhibitor/angiotensin II receptor blocker is being taken.  Hyperlipidemia This is a chronic problem. The current episode started more than 1 year ago. The problem is uncontrolled. Exacerbating diseases include diabetes and obesity. Pertinent negatives include no chest pain, myalgias or shortness of breath. Current antihyperlipidemic treatment includes statins. Risk factors for coronary artery disease include dyslipidemia, diabetes mellitus, hypertension, a sedentary lifestyle and post-menopausal.  Hypertension This is a chronic problem. The current episode started more than 1 year ago. Pertinent negatives include no chest pain, headaches,  palpitations or shortness of breath. Risk factors for coronary artery disease include dyslipidemia, diabetes mellitus, obesity, sedentary lifestyle and post-menopausal state. Past treatments include ACE inhibitors.     Review of Systems  Constitutional:  Negative for chills, fever and unexpected weight change.  HENT:  Negative for trouble swallowing and voice change.   Eyes:  Negative for visual disturbance.  Respiratory:  Negative for cough, shortness of breath and wheezing.   Cardiovascular:  Negative for chest pain, palpitations and leg swelling.  Gastrointestinal:  Negative for diarrhea, nausea and vomiting.  Endocrine: Negative for cold intolerance, heat intolerance, polydipsia, polyphagia and polyuria.  Musculoskeletal:  Negative for arthralgias and myalgias.  Skin:  Negative for color change, pallor, rash and wound.  Neurological:  Negative for seizures and headaches.       Right foot drop as of the time of her Whipple surgery.  Psychiatric/Behavioral:  Negative for confusion and suicidal ideas.     Objective:       11/01/2021   11:01 AM 11/01/2021   10:42 AM 08/30/2021    1:21 PM  Vitals with BMI  Height '5\' 4"'$  '5\' 4"'$  '5\' 4"'$   Weight 194 lbs 3 oz 194 lbs 198 lbs 13 oz  BMI 33.32 99.24 26.83  Systolic 419  622  Diastolic 62  76  Pulse 80  92    BP 108/62   Pulse 80   Ht '5\' 4"'$  (1.626 m)   Wt 194 lb 3.2 oz (88.1 kg)   BMI 33.33 kg/m   Wt Readings from Last 3 Encounters:  11/01/21 194 lb 3.2 oz (88.1 kg)  11/01/21 194 lb (88 kg)  08/30/21 198 lb 12.8 oz (90.2 kg)         CMP ( most recent) CMP     Component Value Date/Time   NA 141 07/22/2021 0000   K 4.3 07/22/2021 0000   CL 102 07/22/2021 0000   CO2 19 07/22/2021 0000   GLUCOSE 76 02/25/2019 0505   BUN 20 07/22/2021 0000   CREATININE 0.8 07/22/2021 0000   CREATININE 0.94 02/25/2019 0505   CALCIUM 9.3 07/22/2021 0000   PROT 5.8 (L) 02/25/2019 0505   ALBUMIN 4.1 07/22/2021 0000   AST 34 07/22/2021 0000    ALT 41 (A) 07/22/2021 0000   ALKPHOS 169 (A) 07/22/2021 0000   BILITOT 0.2 (L) 02/25/2019 0505   GFRNONAA >60 02/25/2019 0505   GFRAA >60 02/25/2019 0505     Diabetic Labs (most recent): Lab Results  Component Value Date   HGBA1C 7.2 (A) 11/01/2021   HGBA1C 7.7  07/22/2021   HGBA1C 7.5 (H) 01/11/2019     Lipid Panel ( most recent) Lipid Panel     Component Value Date/Time   TRIG 130 01/29/2019 0542     Assessment & Plan:   1. Type 2 diabetes mellitus with hyperglycemia, with long-term current use of insulin (HCC)   - Astra Gregg has currently uncontrolled symptomatic type 2 DM since  56 years of age.   She presents with controlled glycemic profile.  Did not bring any logs nor meter to review.  However her recent A1c was 7.7%.  She does not report hypoglycemia. Recent labs reviewed. - I had a long discussion with her about the progressive nature of diabetes and the pathology behind its complications. -her diabetes is complicated by nonalcoholic fatty liver disease, obesity/sedentary life, comorbid hyperlipidemia, hypertension, history of pancreatic cancer status post pancreatic resection in December 2020 and she remains at a high risk for more acute and chronic complications which include CAD, CVA, CKD, retinopathy, and neuropathy. These are all discussed in detail with her.  - I discussed all available options of managing her diabetes including de-escalation of medications. I have counseled her on diet  and weight management  by adopting a Whole Food , Plant Predominant  ( WFPP) nutrition as recommended by [employer] of Lifestyle Medicine. Patient is encouraged to switch to  unprocessed or minimally processed  complex starch, adequate protein intake (mainly plant source), minimal liquid fat ( mainly vegetable oils), plenty of fruits, and vegetables. -  she is advised to stick to a routine mealtimes to eat 3 complete meals a day and snack only when necessary ( to  snack only to correct hypoglycemia BG <70 day time or <100 at night).  - she acknowledges that there is a room for improvement in her food and drink choices. - Suggestion is made for her to avoid simple carbohydrates  from her diet including Cakes, Sweet Desserts, Ice Cream, Soda (diet and regular), Sweet Tea, Candies, Chips, Cookies, Store Bought Juices, Alcohol , Artificial Sweeteners,  Coffee Creamer, and "Sugar-free" Products, Lemonade. This will help patient to have more stable blood glucose profile and potentially avoid unintended weight gain.  The following Lifestyle Medicine recommendations according to [place]  [facility]) were discussed and and offered to patient and she  agrees to start the journey:  A. Whole Foods, Plant-Based Nutrition comprising of fruits and vegetables, plant-based proteins, whole-grain carbohydrates was discussed in detail with the patient.   A list for source of those nutrients were also provided to the patient.  Patient will use only water or unsweetened tea for hydration. B.  The need to stay away from risky substances including alcohol, smoking; obtaining 7 to 9 hours of restorative sleep, at least 150 minutes of moderate intensity exercise weekly, the importance of healthy social connections,  and stress management techniques were discussed. C.  A full color page of  Calorie density of various food groups per pound showing examples of each food groups was provided to the patient.   - she has been scheduled with Jearld Fenton, RDN, CDE for individualized diabetes education.  - I have approached her with the following plan to manage  her diabetes and patient agrees:   -Based on her presentation with A1c of 7.2%, she will not need prandial insulin for now.  She is advised to continue Tresiba 30 units nightly,   She is advised to monitor blood glucose twice a day-daily before breakfast and  at bedtime . This patient will benefit from a CGM  device.  I discussed and prescribed a replacement Libre device for her.    - she is warned not to take insulin without proper monitoring per orders.  - she is encouraged to call clinic for blood glucose levels less than 70 or above 200 mg /dl 3 days a week at fasting. -She is not a candidate for non-insulin treatment options.  - Specific targets for  A1c;  LDL, HDL,  and Triglycerides were discussed with the patient.  2) Blood Pressure /Hypertension:  her blood pressure is controlled to target.   she is advised to continue her current medications including lisinopril 20 mg p.o. daily, metoprolol 50 mg p.o. daily with breakfast .  3) Lipids/Hyperlipidemia: No recent lipid panel to review.  She is advised to continue Crestor 10 mg p.o. nightly.  Whole food plant-based diet described above is highly recommended for her to achieve control of her lipid panel in light of her medical history of nonalcoholic fatty liver disease (metabolic dysfunction associated steatotic liver disease).   4)  Weight/Diet:  Body mass index is 33.33 kg/m.  -   clearly complicating her diabetes care.   she is  a candidate for weight loss. I discussed with her the fact that loss of 5 - 10% of her  current body weight will have the most impact on her diabetes management.  The above detailed  ACLM recommendations for nutrition, exercise, sleep, social life, avoidance of risky substances, the need for restorative sleep   information will also detailed on discharge instructions.   5) multi-nodular goiter: I reviewed her recent thyroid ultrasound which showed 1.1 cm right inferior thyroid nodule, and 1.1 cm left inferior thyroid nodule.  This ultrasound was from April 01, 2021.  She reports that she already has a plan to repeat thyroid ultrasound via her primary care doctor.  We will request for a copy of the study.  6) Chronic Care/Health Maintenance:  -she  is on ACEI/ARB and Statin medications and  is encouraged to initiate  and continue to follow up with Ophthalmology, Dentist,  Podiatrist at least yearly or according to recommendations, and advised to   stay away from smoking. I have recommended yearly flu vaccine and pneumonia vaccine at least every 5 years; moderate intensity exercise for up to 150 minutes weekly; and  sleep for 7- 9 hours a day.  - she is  advised to maintain close follow up with Margy Clarks, NP for primary care needs, as well as her other providers for optimal and coordinated care.    I spent 41 minutes in the care of the patient today including review of labs from [place], Lipids, Thyroid Function, Hematology (current and previous including abstractions from other facilities); face-to-face time discussing  her blood glucose readings/logs, discussing hypoglycemia and hyperglycemia episodes and symptoms, medications doses, her options of short and long term treatment based on the latest standards of care / guidelines;  discussion about incorporating lifestyle medicine;  and documenting the encounter. Risk reduction counseling performed per USPSTF guidelines to reduce obesity and cardiovascular risk factors.     Please refer to Patient Instructions for Blood Glucose Monitoring and Insulin/Medications Dosing Guide"  in media tab for additional information. Please  also refer to " Patient Self Inventory" in the Media  tab for reviewed elements of pertinent patient history.  Leah Farmer participated in the discussions, expressed understanding, and voiced agreement with the above plans.  All questions  were answered to her satisfaction. she is encouraged to contact clinic should she have any questions or concerns prior to her return visit.    Follow up plan: - Return in about 3 months (around 02/01/2022) for F/U with Pre-visit Labs, Meter/CGM/Logs, A1c here.  Glade Lloyd, MD [facility] [address] [place], [place] [address] Phone: [phone]   Fax: [phone]    11/01/2021, 3:35 PM  This note was partially dictated with voice recognition software. Similar sounding words can be transcribed inadequately or may not  be corrected upon review.

## 2021-11-01 NOTE — Patient Instructions (Signed)
Goals  Keep working on breakfast options. Keep exercising. Get A1C to 7%

## 2021-11-01 NOTE — Patient Instructions (Signed)

## 2021-11-02 ENCOUNTER — Telehealth: Payer: Self-pay

## 2021-11-02 NOTE — Telephone Encounter (Signed)
Rx for [place] 2 CGM system sent to [facility].

## 2021-11-03 ENCOUNTER — Encounter: Payer: Self-pay | Admitting: Nutrition

## 2021-11-15 ENCOUNTER — Encounter: Payer: 59 | Admitting: Nutrition

## 2022-01-03 ENCOUNTER — Encounter: Payer: Self-pay | Admitting: General Surgery

## 2022-01-29 LAB — TSH: TSH: 0.734 u[IU]/mL (ref 0.450–4.500)

## 2022-01-29 LAB — LIPID PANEL
Chol/HDL Ratio: 2.2 ratio (ref 0.0–4.4)
Cholesterol, Total: 118 mg/dL (ref 100–199)
HDL: 54 mg/dL (ref >39)
LDL Chol Calc (NIH): 46 mg/dL (ref 0–99)
Triglycerides: 95 mg/dL (ref 0–149)
VLDL Cholesterol Cal: 18 mg/dL (ref 5–40)

## 2022-01-29 LAB — COMPREHENSIVE METABOLIC PANEL
ALT: 55 IU/L — ABNORMAL HIGH (ref 0–32)
AST: 64 IU/L — ABNORMAL HIGH (ref 0–40)
Albumin/Globulin Ratio: 1.3 (ref 1.2–2.2)
Albumin: 3.9 g/dL (ref 3.8–4.9)
Alkaline Phosphatase: 179 IU/L — ABNORMAL HIGH (ref 44–121)
BUN/Creatinine Ratio: 14 (ref 9–23)
BUN: 9 mg/dL (ref 6–24)
Bilirubin Total: 0.2 mg/dL (ref 0.0–1.2)
CO2: 20 mmol/L (ref 20–29)
Calcium: 8.5 mg/dL — ABNORMAL LOW (ref 8.7–10.2)
Chloride: 102 mmol/L (ref 96–106)
Creatinine, Ser: 0.66 mg/dL (ref 0.57–1.00)
Globulin, Total: 2.9 g/dL (ref 1.5–4.5)
Glucose: 249 mg/dL — ABNORMAL HIGH (ref 70–99)
Potassium: 4.4 mmol/L (ref 3.5–5.2)
Sodium: 138 mmol/L (ref 134–144)
Total Protein: 6.8 g/dL (ref 6.0–8.5)
eGFR: 104 mL/min/{1.73_m2} (ref >59)

## 2022-01-29 LAB — VITAMIN D 25 HYDROXY (VIT D DEFICIENCY, FRACTURES): Vit D, 25-Hydroxy: 24.5 ng/mL — ABNORMAL LOW (ref 30.0–100.0)

## 2022-01-29 LAB — T4, FREE: Free T4: 1.23 ng/dL (ref 0.82–1.77)

## 2022-01-29 LAB — VITAMIN B12: Vitamin B-12: 310 pg/mL (ref 232–1245)

## 2022-02-07 ENCOUNTER — Ambulatory Visit (INDEPENDENT_AMBULATORY_CARE_PROVIDER_SITE_OTHER): Payer: Medicare Other | Admitting: "Endocrinology

## 2022-02-07 ENCOUNTER — Encounter: Payer: Self-pay | Admitting: "Endocrinology

## 2022-02-07 VITALS — BP 102/58 | HR 80 | Ht 64.0 in | Wt 188.4 lb

## 2022-02-07 DIAGNOSIS — E042 Nontoxic multinodular goiter: Secondary | ICD-10-CM

## 2022-02-07 DIAGNOSIS — I10 Essential (primary) hypertension: Secondary | ICD-10-CM

## 2022-02-07 DIAGNOSIS — E1165 Type 2 diabetes mellitus with hyperglycemia: Secondary | ICD-10-CM | POA: Diagnosis not present

## 2022-02-07 DIAGNOSIS — K760 Fatty (change of) liver, not elsewhere classified: Secondary | ICD-10-CM

## 2022-02-07 DIAGNOSIS — Z794 Long term (current) use of insulin: Secondary | ICD-10-CM

## 2022-02-07 DIAGNOSIS — E782 Mixed hyperlipidemia: Secondary | ICD-10-CM

## 2022-02-07 LAB — POCT GLYCOSYLATED HEMOGLOBIN (HGB A1C): HbA1c, POC (controlled diabetic range): 7.4 % — AB (ref 0.0–7.0)

## 2022-02-07 MED ORDER — TRESIBA FLEXTOUCH 100 UNIT/ML ~~LOC~~ SOPN: 34.0000 [IU] | PEN_INJECTOR | Freq: Every day | SUBCUTANEOUS | 1 refills | Status: DC

## 2022-02-07 NOTE — Patient Instructions (Signed)

## 2022-02-07 NOTE — Progress Notes (Signed)
02/07/2022, 2:08 PM   Subjective:    Patient ID: Leah Farmer, female    DOB: 1965-09-04.  Ambri Miltner is being seen in follow-up after she was seen in consultation for management of currently uncontrolled symptomatic diabetes requested by  Margy Clarks, NP.   Past Medical History:  Diagnosis Date   Anxiety    Arthritis 2020   lower back and hip; takes only OTC Motrin   Depression    mild and situational; not medicated   Diabetes mellitus without complication ([place])    unsure if type 1 or 2 , dx as an adult    GERD (gastroesophageal reflux disease)    History of kidney stones 07/2018   hospitalized at [facility]; passed on her own   Hypertension    NAFL (nonalcoholic fatty liver) 76/72/0947   fatty liver seen on CT scan abd/ pelvis   Nephrolithiasis    lov in june 2020 , also had covid at this time    pancreatic ca dx'd 08/2018   pancreatic   Pneumonia    PONV (postoperative nausea and vomiting)    Tachycardia    generally runs in the 120s    Past Surgical History:  Procedure Laterality Date   APPENDECTOMY  in 44s   CHOLECYSTECTOMY  in 20s   ESOPHAGOGASTRODUODENOSCOPY (EGD) WITH PROPOFOL N/A 11/01/2018   Procedure: ESOPHAGOGASTRODUODENOSCOPY (EGD) WITH PROPOFOL;  Surgeon: Milus Banister, MD;  Location: [facility];  Service: Endoscopy;  Laterality: N/A;   EUS N/A 11/01/2018   Procedure: UPPER ENDOSCOPIC ULTRASOUND (EUS) LINEAR;  Surgeon: Milus Banister, MD;  Location: [facility];  Service: Endoscopy;  Laterality: N/A;   FINE NEEDLE ASPIRATION N/A 11/01/2018   Procedure: FINE NEEDLE ASPIRATION (FNA) LINEAR;  Surgeon: Milus Banister, MD;  Location: [facility];  Service: Endoscopy;  Laterality: N/A;   IR CATHETER TUBE CHANGE  02/16/2019   IR RADIOLOGIST EVAL & MGMT  03/14/2019   IR THORACENTESIS ASP PLEURAL SPACE W/IMG GUIDE  01/21/2019   LAPAROSCOPY N/A 01/15/2019   Procedure: LAPAROSCOPY DIAGNOSTIC;  Surgeon: Stark Klein, MD;  Location:  [place];  Service: General;  Laterality: N/A;   WHIPPLE PROCEDURE N/A 01/15/2019   Procedure: WHIPPLE PROCEDURE;  Surgeon: Stark Klein, MD;  Location: [place];  Service: General;  Laterality: N/A;    Social History   Socioeconomic History   Marital status: Married    Spouse name: Not on file   Number of children: Not on file   Years of education: Not on file   Highest education level: Not on file  Occupational History   Not on file  Tobacco Use   Smoking status: Never   Smokeless tobacco: Never  Vaping Use   Vaping Use: Never used  Substance and Sexual Activity   Alcohol use: Never   Drug use: Never   Sexual activity: Not Currently  Other Topics Concern   Not on file  Social History Narrative   Not on file   Social Determinants of Health   Financial Resource Strain: Not on file  Food Insecurity: Not on file  Transportation Needs: Not on file  Physical Activity: Not on file  Stress: Not on file  Social Connections: Not on file    Family History  Problem Relation Age of Onset   Hypertension Mother    Diabetes Mother    Pancreatic cancer Mother    Hypertension Father    Diabetes Father    Esophageal cancer Maternal Uncle  Passed at 32. Found colon cancer on colonoscopy at that time.    Colon cancer Neg Hx    Colon polyps Neg Hx     Outpatient Encounter Medications as of 02/07/2022  Medication Sig   clonazePAM (KLONOPIN) 0.5 MG tablet Take 0.25-0.5 mg by mouth daily as needed for anxiety.   Continuous Blood Gluc Receiver (FREESTYLE LIBRE 2 READER) DEVI As directed   Continuous Blood Gluc Sensor (FREESTYLE LIBRE 2 SENSOR) MISC 1 Piece by Does not apply route every 14 (fourteen) days.   insulin degludec (TRESIBA FLEXTOUCH) 100 UNIT/ML FlexTouch Pen Inject 34 Units into the skin at bedtime.   lisinopril (ZESTRIL) 20 MG tablet Take 1 tablet (20 mg total) by mouth daily.   meclizine (ANTIVERT) 12.5 MG tablet Take 12.5 mg by mouth 3 (three) times daily as needed for  dizziness.   metoprolol succinate (TOPROL-XL) 50 MG 24 hr tablet Take 50 mg by mouth at bedtime. Take with or immediately following a meal.   Misc Natural Products (ZARBEES ALL-IN-ONE PO) Take 3 tablets by mouth daily.   ondansetron (ZOFRAN) 8 MG tablet Take 1 tablet (8 mg total) by mouth every 8 (eight) hours as needed for nausea.   oxyCODONE (OXY IR/ROXICODONE) 5 MG immediate release tablet Take 1-2 tablets (5-10 mg total) by mouth every 6 (six) hours as needed for moderate pain, severe pain or breakthrough pain.   promethazine (PHENERGAN) 12.5 MG tablet Take 12.5 mg by mouth every 6 (six) hours as needed. (Patient not taking: Reported on 02/20/2020)   rosuvastatin (CRESTOR) 10 MG tablet Take 10 mg by mouth at bedtime.   tamsulosin (FLOMAX) 0.4 MG CAPS capsule Take 0.4 mg by mouth daily as needed. For kidney stone pain   Vitamin D, Ergocalciferol, (DRISDOL) 1.25 MG (50000 UT) CAPS capsule Take 50,000 Units by mouth every Saturday.   [DISCONTINUED] insulin degludec (TRESIBA FLEXTOUCH) 100 UNIT/ML FlexTouch Pen Inject 30 Units into the skin at bedtime.   Facility-Administered Encounter Medications as of 02/07/2022  Medication   sodium chloride 0.9 % 1,000 mL with thiamine 616 mg, folic acid 1 mg, multivitamins adult 10 mL infusion   sodium chloride 0.9 % 1,000 mL with thiamine 073 mg, folic acid 1 mg, multivitamins adult 10 mL infusion   sodium chloride 0.9 % bolus 1,000 mL   sodium chloride 0.9 % bolus 1,000 mL    ALLERGIES: Allergies  Allergen Reactions   Ciprofloxacin Hives    VACCINATION STATUS:  There is no immunization history on file for this patient.  Diabetes She presents for her follow-up diabetic visit. She has type 2 diabetes mellitus. Onset time: She was diagnosed at approximate age of 44 years.  After Whipple surgery in December 2020, she was put on insulin. Her disease course has been stable. There are no hypoglycemic associated symptoms. Pertinent negatives for hypoglycemia  include no confusion, headaches, pallor or seizures. There are no diabetic associated symptoms. Pertinent negatives for diabetes include no chest pain, no polydipsia, no polyphagia and no polyuria. There are no hypoglycemic complications. Symptoms are stable. (She does not report gross complications from her diabetes.  However this patient has comorbid conditions including nonalcoholic fatty liver disease, hyperlipidemia, hypertension, obesity class I) Risk factors for coronary artery disease include dyslipidemia, diabetes mellitus, obesity, sedentary lifestyle and post-menopausal. Current diabetic treatment includes insulin injections. Her weight is decreasing steadily. She is following a generally unhealthy diet. When asked about meal planning, she reported none. She has not had a previous visit with a dietitian.  She participates in exercise intermittently. Her home blood glucose trend is fluctuating minimally. Her breakfast blood glucose range is generally 140-180 mg/dl. Her bedtime blood glucose range is generally 140-180 mg/dl. Her overall blood glucose range is 140-180 mg/dl. (She presents with her freestyle libre device.  AGP report shows 31% time in range, 29% level 1 hyperglycemia, 20% level 2 hyperglycemia.  She did not document any hypoglycemia.  Her point-of-care A1c is 7.4%.   ) An ACE inhibitor/angiotensin II receptor blocker is being taken.  Hyperlipidemia This is a chronic problem. The current episode started more than 1 year ago. The problem is uncontrolled. Exacerbating diseases include diabetes and obesity. Pertinent negatives include no chest pain, myalgias or shortness of breath. Current antihyperlipidemic treatment includes statins. Risk factors for coronary artery disease include dyslipidemia, diabetes mellitus, hypertension, a sedentary lifestyle and post-menopausal.  Hypertension This is a chronic problem. The current episode started more than 1 year ago. Pertinent negatives include no  chest pain, headaches, palpitations or shortness of breath. Risk factors for coronary artery disease include dyslipidemia, diabetes mellitus, obesity, sedentary lifestyle and post-menopausal state. Past treatments include ACE inhibitors.     Review of Systems  Constitutional:  Negative for chills, fever and unexpected weight change.  HENT:  Negative for trouble swallowing and voice change.   Eyes:  Negative for visual disturbance.  Respiratory:  Negative for cough, shortness of breath and wheezing.   Cardiovascular:  Negative for chest pain, palpitations and leg swelling.  Gastrointestinal:  Negative for diarrhea, nausea and vomiting.  Endocrine: Negative for cold intolerance, heat intolerance, polydipsia, polyphagia and polyuria.  Musculoskeletal:  Negative for arthralgias and myalgias.  Skin:  Negative for color change, pallor, rash and wound.  Neurological:  Negative for seizures and headaches.       Right foot drop as of the time of her Whipple surgery.  Psychiatric/Behavioral:  Negative for confusion and suicidal ideas.     Objective:       02/07/2022    8:47 AM 11/01/2021   11:01 AM 11/01/2021   10:42 AM  Vitals with BMI  Height '5\' 4"'$  '5\' 4"'$  '5\' 4"'$   Weight 188 lbs 6 oz 194 lbs 3 oz 194 lbs  BMI 32.32 08.67 61.95  Systolic 093 267   Diastolic 58 62   Pulse 80 80     BP (!) 102/58   Pulse 80   Ht '5\' 4"'$  (1.626 m)   Wt 188 lb 6.4 oz (85.5 kg)   BMI 32.34 kg/m   Wt Readings from Last 3 Encounters:  02/07/22 188 lb 6.4 oz (85.5 kg)  11/01/21 194 lb 3.2 oz (88.1 kg)  11/01/21 194 lb (88 kg)      CMP ( most recent) CMP     Component Value Date/Time   NA 138 01/28/2022 1010   K 4.4 01/28/2022 1010   CL 102 01/28/2022 1010   CO2 20 01/28/2022 1010   GLUCOSE 249 (H) 01/28/2022 1010   GLUCOSE 76 02/25/2019 0505   BUN 9 01/28/2022 1010   CREATININE 0.66 01/28/2022 1010   CALCIUM 8.5 (L) 01/28/2022 1010   PROT 6.8 01/28/2022 1010   ALBUMIN 3.9 01/28/2022 1010   AST 64  (H) 01/28/2022 1010   ALT 55 (H) 01/28/2022 1010   ALKPHOS 179 (H) 01/28/2022 1010   BILITOT 0.2 01/28/2022 1010   GFRNONAA >60 02/25/2019 0505   GFRAA >60 02/25/2019 0505     Diabetic Labs (most recent): Lab Results  Component Value Date  HGBA1C 7.4 (A) 02/07/2022   HGBA1C 7.2 (A) 11/01/2021   HGBA1C 7.7 07/22/2021     Lipid Panel ( most recent) Lipid Panel     Component Value Date/Time   CHOL 118 01/28/2022 1010   TRIG 95 01/28/2022 1010   HDL 54 01/28/2022 1010   CHOLHDL 2.2 01/28/2022 1010   LDLCALC 46 01/28/2022 1010   LABVLDL 18 01/28/2022 1010     Assessment & Plan:   1. Type 2 diabetes mellitus with hyperglycemia, with long-term current use of insulin (HCC)   - Jossalin Chervenak has currently uncontrolled symptomatic type 2 DM since  57 years of age.  She presents with her freestyle libre device.  AGP report shows 31% time in range, 29% level 1 hyperglycemia, 20% level 2 hyperglycemia.  She did not document any hypoglycemia.  Her point-of-care A1c is 7.4%.    Recent labs reviewed. - I had a long discussion with her about the progressive nature of diabetes and the pathology behind its complications. -her diabetes is complicated by nonalcoholic fatty liver disease, obesity/sedentary life, comorbid hyperlipidemia, hypertension, history of pancreatic cancer status post pancreatic resection in December 2020 and she remains at a high risk for more acute and chronic complications which include CAD, CVA, CKD, retinopathy, and neuropathy. These are all discussed in detail with her.  - I discussed all available options of managing her diabetes including de-escalation of medications. I have counseled her on diet  and weight management  by adopting a Whole Food , Plant Predominant  ( WFPP) nutrition as recommended by [employer] of Lifestyle Medicine. Patient is encouraged to switch to  unprocessed or minimally processed  complex starch, adequate protein intake (mainly  plant source), minimal liquid fat ( mainly vegetable oils), plenty of fruits, and vegetables. -  she is advised to stick to a routine mealtimes to eat 3 complete meals a day and snack only when necessary ( to snack only to correct hypoglycemia BG <70 day time or <100 at night).  In light of her significant metabolic dysfunction including nonalcoholic fatty liver disease, hyperlipidemia, hypertension, obesity, she remains a good candidate for lifestyle meds. - she acknowledges that there is a room for improvement in her food and drink choices. - Suggestion is made for her to avoid simple carbohydrates  from her diet including Cakes, Sweet Desserts, Ice Cream, Soda (diet and regular), Sweet Tea, Candies, Chips, Cookies, Store Bought Juices, Alcohol , Artificial Sweeteners,  Coffee Creamer, and "Sugar-free" Products, Lemonade. This will help patient to have more stable blood glucose profile and potentially avoid unintended weight gain.  The following Lifestyle Medicine recommendations according to [facility]) were discussed and and offered to patient and she  agrees to start the journey:  A. Whole Foods, Plant-Based Nutrition comprising of fruits and vegetables, plant-based proteins, whole-grain carbohydrates was discussed in detail with the patient.   A list for source of those nutrients were also provided to the patient.  Patient will use only water or unsweetened tea for hydration. B.  The need to stay away from risky substances including alcohol, smoking; obtaining 7 to 9 hours of restorative sleep, at least 150 minutes of moderate intensity exercise weekly, the importance of healthy social connections,  and stress management techniques were discussed. C.  A full color page of  Calorie density of various food groups per pound showing examples of each food groups was provided to the patient.    - she has been  scheduled with Jearld Fenton, RDN, CDE for individualized  diabetes education.  - I have approached her with the following plan to manage  her diabetes and patient agrees:   -Based on her presentation with A1c of 7.4%, she will not need prandial insulin for now.  She is advised to increase Tresiba to 34 units nightly, associated with continuous monitoring of her CGM device.    - she is warned not to take insulin without proper monitoring per orders.  - she is encouraged to call clinic for blood glucose levels less than 70 or above 200 mg /dl 3 days a week at fasting. -She is not a candidate for non-insulin treatment options.  - Specific targets for  A1c;  LDL, HDL,  and Triglycerides were discussed with the patient.  2) Blood Pressure /Hypertension: Her blood pressure is controlled to target.   she is advised to continue her current medications including lisinopril 20 mg p.o. daily, metoprolol 50 mg p.o. daily with breakfast .  3) Lipids/Hyperlipidemia:   Her recent lipid panel showed controlled LDL at 46.  She is advised to continue Crestor 10 mg p.o. nightly.  Whole food plant-based diet described above is highly recommended for her to achieve control of her lipid panel in light of her medical history of nonalcoholic fatty liver disease (metabolic dysfunction associated steatotic liver disease).   4)  Weight/Diet:  Body mass index is 32.34 kg/m.  -   clearly complicating her diabetes care.   she is  a candidate for weight loss. I discussed with her the fact that loss of 5 - 10% of her  current body weight will have the most impact on her diabetes management.  The above detailed  ACLM recommendations for nutrition, exercise, sleep, social life, avoidance of risky substances, the need for restorative sleep   information will also detailed on discharge instructions.   5) multi-nodular goiter: I reviewed her recent thyroid ultrasound which showed 1.1 cm right inferior thyroid nodule, and 1.1 cm left inferior thyroid nodule.  This ultrasound was from  April 01, 2021.  She reports that she already has a plan to repeat thyroid ultrasound via her primary care doctor.  We will request for a copy of the study.  6) Chronic Care/Health Maintenance:  -she  is on ACEI/ARB and Statin medications and  is encouraged to initiate and continue to follow up with Ophthalmology, Dentist,  Podiatrist at least yearly or according to recommendations, and advised to   stay away from smoking. I have recommended yearly flu vaccine and pneumonia vaccine at least every 5 years; moderate intensity exercise for up to 150 minutes weekly; and  sleep for 7- 9 hours a day.  - she is  advised to maintain close follow up with Margy Clarks, NP for primary care needs, as well as her other providers for optimal and coordinated care.   I spent 41 minutes in the care of the patient today including review of labs from [place], Lipids, Thyroid Function, Hematology (current and previous including abstractions from other facilities); face-to-face time discussing  her blood glucose readings/logs, discussing hypoglycemia and hyperglycemia episodes and symptoms, medications doses, her options of short and long term treatment based on the latest standards of care / guidelines;  discussion about incorporating lifestyle medicine;  and documenting the encounter. Risk reduction counseling performed per USPSTF guidelines to reduce  obesity and cardiovascular risk factors.     Please refer to Patient Instructions for Blood Glucose Monitoring and Insulin/Medications Dosing Guide"  in media tab for additional information. Please  also refer to " Patient Self Inventory" in the Media  tab for reviewed elements of pertinent patient history.  Leah Farmer participated in the discussions, expressed understanding, and voiced agreement with the above plans.  All questions were answered to her satisfaction. she is encouraged to contact clinic should she have any questions or concerns prior to her return  visit.    Follow up plan: - Return in about 4 months (around 06/08/2022) for F/U with Pre-visit Labs, Meter/CGM/Logs, A1c here.  Glade Lloyd, MD [facility] [address] [place], [place] [address] Phone: [phone]  Fax: [phone]    02/07/2022, 2:08 PM  This note was partially dictated with voice recognition software. Similar sounding words can be transcribed inadequately or may not  be corrected upon review.

## 2022-02-22 ENCOUNTER — Other Ambulatory Visit: Payer: Self-pay | Admitting: General Surgery

## 2022-02-22 DIAGNOSIS — E042 Nontoxic multinodular goiter: Secondary | ICD-10-CM

## 2022-02-22 DIAGNOSIS — E041 Nontoxic single thyroid nodule: Secondary | ICD-10-CM

## 2022-04-04 ENCOUNTER — Ambulatory Visit
Admission: RE | Admit: 2022-04-04 | Discharge: 2022-04-04 | Disposition: A | Discharge status: Home / Self Care | Payer: Medicare Other | Source: Ambulatory Visit | Attending: General Surgery | Admitting: General Surgery

## 2022-04-04 DIAGNOSIS — E042 Nontoxic multinodular goiter: Secondary | ICD-10-CM

## 2022-04-04 DIAGNOSIS — E041 Nontoxic single thyroid nodule: Secondary | ICD-10-CM

## 2022-04-07 ENCOUNTER — Other Ambulatory Visit (HOSPITAL_COMMUNITY): Payer: Self-pay | Admitting: General Surgery

## 2022-04-07 DIAGNOSIS — C7A8 Other malignant neuroendocrine tumors: Secondary | ICD-10-CM

## 2022-04-13 ENCOUNTER — Other Ambulatory Visit (HOSPITAL_COMMUNITY): Payer: Self-pay | Admitting: General Surgery

## 2022-04-13 DIAGNOSIS — C7A8 Other malignant neuroendocrine tumors: Secondary | ICD-10-CM

## 2022-05-23 ENCOUNTER — Ambulatory Visit (HOSPITAL_COMMUNITY): Payer: PRIVATE HEALTH INSURANCE

## 2022-05-23 ENCOUNTER — Ambulatory Visit (HOSPITAL_COMMUNITY)
Admission: RE | Admit: 2022-05-23 | Discharge: 2022-05-23 | Disposition: A | Discharge status: Home / Self Care | Payer: Medicare Other | Source: Ambulatory Visit | Attending: General Surgery | Admitting: General Surgery

## 2022-05-23 DIAGNOSIS — C7A8 Other malignant neuroendocrine tumors: Secondary | ICD-10-CM

## 2022-05-23 MED ORDER — COPPER CU 64 DOTATATE 1 MCI/ML IV SOLN
4.0000 | Freq: Once | INTRAVENOUS | Status: AC
  Administered 2022-05-23: 3.83 via INTRAVENOUS

## 2022-06-06 ENCOUNTER — Telehealth: Payer: Self-pay

## 2022-06-06 NOTE — Telephone Encounter (Signed)
Almond Lint, MD  Mansouraty, Netty Starring., MD; Loretha Stapler, RN Love it. Fb

## 2022-06-06 NOTE — Telephone Encounter (Signed)
-----   Message from Lemar Lofty., MD sent at 06/03/2022  5:23 PM EDT ----- FB, Lets plan the following if you agree: 1) schedule ERCP for attempt at CBD and PD evaluation and stent removal if we can reach the anastomosis 2) schedule separate colonoscopy for colon cancer screening (can be done in [facility])  It is good to be quite a few weeks in the future and as you have said this has been a longstanding alkaline phosphatase elevation but I think it is worth doing this if you are okay with that.  If you agree with this, when you reply just reply all and Alexia Freestone can work on scheduling these procedures. Thanks. GM ----- Message ----- From: Almond Lint, MD Sent: 06/03/2022   8:43 AM EDT To: Lemar Lofty., MD  There are usually at least 2 cm between the two anastamoses.  I almost never place a CBD stent, but it looks like I placed a pancreatic and a biliary stent since her bile duct was so small.  I think they both might still be there.  If you think you can get them with enteroscopy, that would be great.   Fb    ----- Message ----- From: Lemar Lofty., MD Sent: 05/31/2022   1:33 PM EDT To: Almond Lint, MD  FB, A few thoughts. We can certainly work on trying to get her scheduled for her colonoscopy. In regards to the alkaline phosphatase elevation in the PD stent, do feel that the PD jejunostomy is quite close to the CBD jejunostomy, as I would expect alkaline phosphatase to be elevated if there were some CBD issues. We had another patient that we did share that I recall did have a stent in the CBD that we were able to finally get removed and I think to help her if you recall. Can certainly try to get her set up for an enteroscopy to see if I can reach, but it will delay her colonoscopy, but maybe we could do both? I do not see it as an invaluable tool to consider an abdominal ultrasound elastography to evaluate for fibrosis when there is underlying fatty liver and  although it may not be covered completely when we do not have a diagnosis of cirrhosis it would be helpful in clarifying longer term concerns. Thoughts? GM ----- Message ----- From: Almond Lint, MD Sent: 05/31/2022   8:42 AM EDT To: Lemar Lofty., MD  This lady is supposed to be getting colonoscopy set up.  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    She still has her pancreatic duct stent in.  This is a portion of pediatric feeding tube and typically falls out once the swelling goes down.  I think this is the cause of her mildly elevated alk phos.  I haven't typically had anyone try to go after that with a scope.    In any event, her endocrinologist is very concerned about her fatty liver. She is doing well eating a plant based diet 6/7 days of the week.  Her lfts are otherwise normal.    Do you find any value in getting u/s with elastography in these patients with fatty liver to assess for fibrosis? And if there, is there any intervention?  She doesn't drink and is aggressively working on her blood sugars.    Thanks FB

## 2022-06-07 ENCOUNTER — Other Ambulatory Visit: Payer: Self-pay

## 2022-06-07 DIAGNOSIS — Z4689 Encounter for fitting and adjustment of other specified devices: Secondary | ICD-10-CM

## 2022-06-07 DIAGNOSIS — C250 Malignant neoplasm of head of pancreas: Secondary | ICD-10-CM

## 2022-06-07 NOTE — Telephone Encounter (Signed)
ERCP has been scheduled for 08/18/22 at 100 pm at [facility] with GM   [facility] colon

## 2022-06-08 NOTE — Telephone Encounter (Signed)
[facility] colon has been scheduled for 07/19/22 at 230 pm.  Pre visit appt 07/12/22 at 8 am  ERCP appt 08/18/22 at 1 pm at [facility]   Left message on machine to call back

## 2022-06-09 NOTE — Telephone Encounter (Signed)
Left message on machine to call back  

## 2022-06-10 NOTE — Telephone Encounter (Signed)
I have attempted to reach the pt x 3 and have been unable to reach by phone.  I have mailed the information and will send to My Chart  [facility] colon has been scheduled for 07/19/22 at 230 pm.  Pre visit appt 07/12/22 at 8 am  ERCP appt 08/18/22 at 1 pm at [facility]    Left message on machine to call back       Note   You3 days ago    ERCP has been scheduled for 08/18/22 at 100 pm at [facility] with GM    [facility] colon

## 2022-06-10 NOTE — Telephone Encounter (Signed)
Left message on machine to call back  

## 2022-06-13 ENCOUNTER — Ambulatory Visit: Payer: PRIVATE HEALTH INSURANCE | Admitting: "Endocrinology

## 2022-06-13 ENCOUNTER — Ambulatory Visit: Payer: PRIVATE HEALTH INSURANCE | Admitting: Nutrition

## 2022-07-06 ENCOUNTER — Telehealth: Payer: Self-pay

## 2022-07-06 NOTE — Telephone Encounter (Signed)
Patient called to return phone call. Currently not taking Lovenox or Eliquis. Requested to cancel June PV and colonoscopy and will reschedule after endoscopy in August. Patient would also like to know if there will be a pre visit for procedure in August. Please advise, thank you.

## 2022-07-06 NOTE — Telephone Encounter (Signed)
Patty,  Please see previous messages and contact patient regarding [facility] procedure/[facility] procedures; Thank you

## 2022-07-06 NOTE — Telephone Encounter (Signed)
Left message for patient to call back to the office to verify if she is currently on Lovenox and Eliquis; Requested pt to call [phone];

## 2022-07-07 NOTE — Telephone Encounter (Signed)
FYI  The pt called to cancel the colonoscopy that she was set to have with her ERCP.  She prefers to wait and set that up after ERCP.

## 2022-07-07 NOTE — Telephone Encounter (Signed)
That's fine. It can be scheduled in the [facility] in the coming months. Thanks. GM

## 2022-07-19 ENCOUNTER — Encounter: Payer: PRIVATE HEALTH INSURANCE | Admitting: Gastroenterology

## 2022-07-26 LAB — COMPREHENSIVE METABOLIC PANEL
ALT: 31 IU/L (ref 0–32)
AST: 31 IU/L (ref 0–40)
Albumin: 3.7 g/dL — ABNORMAL LOW (ref 3.8–4.9)
Alkaline Phosphatase: 144 IU/L — ABNORMAL HIGH (ref 44–121)
BUN/Creatinine Ratio: 18 (ref 9–23)
BUN: 12 mg/dL (ref 6–24)
Bilirubin Total: 0.3 mg/dL (ref 0.0–1.2)
CO2: 22 mmol/L (ref 20–29)
Calcium: 9.1 mg/dL (ref 8.7–10.2)
Chloride: 105 mmol/L (ref 96–106)
Creatinine, Ser: 0.68 mg/dL (ref 0.57–1.00)
Globulin, Total: 3.3 g/dL (ref 1.5–4.5)
Glucose: 128 mg/dL — ABNORMAL HIGH (ref 70–99)
Potassium: 4.2 mmol/L (ref 3.5–5.2)
Sodium: 140 mmol/L (ref 134–144)
Total Protein: 7 g/dL (ref 6.0–8.5)
eGFR: 103 mL/min/{1.73_m2} (ref >59)

## 2022-08-01 ENCOUNTER — Encounter: Payer: Self-pay | Admitting: "Endocrinology

## 2022-08-01 ENCOUNTER — Ambulatory Visit: Payer: Medicare Other | Admitting: "Endocrinology

## 2022-08-01 VITALS — BP 104/70 | HR 80 | Ht 64.0 in | Wt 187.0 lb

## 2022-08-01 DIAGNOSIS — E1165 Type 2 diabetes mellitus with hyperglycemia: Secondary | ICD-10-CM | POA: Diagnosis not present

## 2022-08-01 DIAGNOSIS — I10 Essential (primary) hypertension: Secondary | ICD-10-CM

## 2022-08-01 DIAGNOSIS — E6609 Other obesity due to excess calories: Secondary | ICD-10-CM

## 2022-08-01 DIAGNOSIS — K760 Fatty (change of) liver, not elsewhere classified: Secondary | ICD-10-CM | POA: Diagnosis not present

## 2022-08-01 DIAGNOSIS — E782 Mixed hyperlipidemia: Secondary | ICD-10-CM

## 2022-08-01 DIAGNOSIS — E042 Nontoxic multinodular goiter: Secondary | ICD-10-CM | POA: Diagnosis not present

## 2022-08-01 DIAGNOSIS — Z794 Long term (current) use of insulin: Secondary | ICD-10-CM

## 2022-08-01 DIAGNOSIS — Z6834 Body mass index (BMI) 34.0-34.9, adult: Secondary | ICD-10-CM

## 2022-08-01 LAB — POCT GLYCOSYLATED HEMOGLOBIN (HGB A1C): HbA1c, POC (controlled diabetic range): 9.8 % — AB (ref 0.0–7.0)

## 2022-08-01 NOTE — Progress Notes (Signed)
08/01/2022, 9:24 AM   Subjective:    Patient ID: Leah Farmer, female    DOB: 1965-02-10.  Leah Farmer is being seen in follow-up after she was seen in consultation for management of currently uncontrolled symptomatic diabetes requested by  Melvyn Neth, NP.   Past Medical History:  Diagnosis Date   Anxiety    Arthritis 2020   lower back and hip; takes only OTC Motrin   Depression    mild and situational; not medicated   Diabetes mellitus without complication (HCC)    unsure if type 1 or 2 , dx as an adult    GERD (gastroesophageal reflux disease)    History of kidney stones 07/2018   [facility]; passed on her own   Hypertension    NAFL (nonalcoholic fatty liver) 07/08/2018   fatty liver seen on CT scan abd/ pelvis   Nephrolithiasis    lov in june 2020 , also had covid at this time    pancreatic ca dx'd 08/2018   pancreatic   Pneumonia    PONV (postoperative nausea and vomiting)    Tachycardia    generally runs in the 120s    Past Surgical History:  Procedure Laterality Date   APPENDECTOMY  in 46s   CHOLECYSTECTOMY  in 20s   ESOPHAGOGASTRODUODENOSCOPY (EGD) WITH PROPOFOL N/A 11/01/2018   Procedure: ESOPHAGOGASTRODUODENOSCOPY (EGD) WITH PROPOFOL;  Surgeon: Rachael Fee, MD;  Location: [facility];  Service: Endoscopy;  Laterality: N/A;   EUS N/A 11/01/2018   Procedure: UPPER ENDOSCOPIC ULTRASOUND (EUS) LINEAR;  Surgeon: Rachael Fee, MD;  Location: [facility];  Service: Endoscopy;  Laterality: N/A;   FINE NEEDLE ASPIRATION N/A 11/01/2018   Procedure: FINE NEEDLE ASPIRATION (FNA) LINEAR;  Surgeon: Rachael Fee, MD;  Location: [facility];  Service: Endoscopy;  Laterality: N/A;   IR CATHETER TUBE CHANGE  02/16/2019   IR RADIOLOGIST EVAL & MGMT  03/14/2019   IR THORACENTESIS ASP PLEURAL SPACE W/IMG GUIDE  01/21/2019   LAPAROSCOPY N/A 01/15/2019   Procedure: LAPAROSCOPY DIAGNOSTIC;  Surgeon: Almond Lint, MD;  Location:  [facility] OR;  Service: General;  Laterality: N/A;   WHIPPLE PROCEDURE N/A 01/15/2019   Procedure: WHIPPLE PROCEDURE;  Surgeon: Almond Lint, MD;  Location: [facility] OR;  Service: General;  Laterality: N/A;    Social History   Socioeconomic History   Marital status: Married    Spouse name: Not on file   Number of children: Not on file   Years of education: Not on file   Highest education level: Not on file  Occupational History   Not on file  Tobacco Use   Smoking status: Never   Smokeless tobacco: Never  Vaping Use   Vaping Use: Never used  Substance and Sexual Activity   Alcohol use: Never   Drug use: Never   Sexual activity: Not Currently  Other Topics Concern   Not on file  Social History Narrative   Not on file   Social Determinants of Health   Financial Resource Strain: Not on file  Food Insecurity: Not on file  Transportation Needs: Not on file  Physical Activity: Not on file  Stress: Not on file  Social Connections: Not on file    Family History  Problem Relation Age of Onset   Hypertension Mother    Diabetes Mother    Pancreatic cancer Mother    Hypertension Father    Diabetes Father    Esophageal cancer Maternal Uncle  Passed at 79. Found colon cancer on colonoscopy at that time.    Colon cancer Neg Hx    Colon polyps Neg Hx     Outpatient Encounter Medications as of 08/01/2022  Medication Sig   clonazePAM (KLONOPIN) 0.5 MG tablet Take 0.25-0.5 mg by mouth daily as needed for anxiety.   Continuous Blood Gluc Receiver (FREESTYLE LIBRE 2 READER) DEVI As directed   Continuous Blood Gluc Sensor (FREESTYLE LIBRE 2 SENSOR) MISC 1 Piece by Does not apply route every 14 (fourteen) days.   insulin degludec (TRESIBA FLEXTOUCH) 100 UNIT/ML FlexTouch Pen Inject 34 Units into the skin at bedtime.   lisinopril (ZESTRIL) 20 MG tablet Take 1 tablet (20 mg total) by mouth daily.   meclizine (ANTIVERT) 12.5 MG tablet Take 12.5 mg by mouth 3 (three) times daily as needed for  dizziness.   metoprolol succinate (TOPROL-XL) 50 MG 24 hr tablet Take 50 mg by mouth at bedtime. Take with or immediately following a meal.   Misc Natural Products (ZARBEES ALL-IN-ONE PO) Take 3 tablets by mouth daily.   ondansetron (ZOFRAN) 8 MG tablet Take 1 tablet (8 mg total) by mouth every 8 (eight) hours as needed for nausea.   oxyCODONE (OXY IR/ROXICODONE) 5 MG immediate release tablet Take 1-2 tablets (5-10 mg total) by mouth every 6 (six) hours as needed for moderate pain, severe pain or breakthrough pain.   promethazine (PHENERGAN) 12.5 MG tablet Take 12.5 mg by mouth every 6 (six) hours as needed. (Patient not taking: Reported on 02/20/2020)   rosuvastatin (CRESTOR) 10 MG tablet Take 10 mg by mouth at bedtime.   tamsulosin (FLOMAX) 0.4 MG CAPS capsule Take 0.4 mg by mouth daily as needed. For kidney stone pain   Vitamin D, Ergocalciferol, (DRISDOL) 1.25 MG (50000 UT) CAPS capsule Take 50,000 Units by mouth every Saturday.   Facility-Administered Encounter Medications as of 08/01/2022  Medication   sodium chloride 0.9 % 1,000 mL with thiamine 100 mg, folic acid 1 mg, multivitamins adult 10 mL infusion   sodium chloride 0.9 % 1,000 mL with thiamine 100 mg, folic acid 1 mg, multivitamins adult 10 mL infusion   sodium chloride 0.9 % bolus 1,000 mL   sodium chloride 0.9 % bolus 1,000 mL    ALLERGIES: Allergies  Allergen Reactions   Ciprofloxacin Hives    VACCINATION STATUS:  There is no immunization history on file for this patient.  Diabetes She presents for her follow-up diabetic visit. She has type 2 diabetes mellitus. Onset time: She was diagnosed at approximate age of 51 years.  After Whipple surgery in December 2020, she was put on insulin. Her disease course has been worsening. There are no hypoglycemic associated symptoms. Pertinent negatives for hypoglycemia include no confusion, headaches, pallor or seizures. There are no diabetic associated symptoms. Pertinent negatives for  diabetes include no chest pain, no polydipsia, no polyphagia and no polyuria. There are no hypoglycemic complications. Symptoms are worsening. (She does not report gross complications from her diabetes.  However this patient has comorbid conditions including nonalcoholic fatty liver disease, hyperlipidemia, hypertension, obesity class I) Risk factors for coronary artery disease include dyslipidemia, diabetes mellitus, obesity, sedentary lifestyle and post-menopausal. Current diabetic treatment includes insulin injections. Her weight is fluctuating minimally. She is following a generally unhealthy diet. When asked about meal planning, she reported none. She has not had a previous visit with a dietitian. She participates in exercise intermittently. Her home blood glucose trend is increasing steadily. Her breakfast blood glucose range is  generally >200 mg/dl. Her lunch blood glucose range is generally >200 mg/dl. Her dinner blood glucose range is generally >200 mg/dl. Her bedtime blood glucose range is generally >200 mg/dl. Her overall blood glucose range is >200 mg/dl. (She presents with her freestyle libre device.  AGP report shows 38% time in range, 27% level 1 hyperglycemia, 35% level 2 hyperglycemia.  She did not document any hypoglycemia.  Her point-of-care A1c is worsening to 9.8% from 7.4%.  She did not call clinic for hyperglycemia.  She is not utilizing her CGM optimally.  CGM active time is 45% She reports exposure to steroids on 2 separate occasions since last visit. ) An ACE inhibitor/angiotensin II receptor blocker is being taken.  Hyperlipidemia This is a chronic problem. The current episode started more than 1 year ago. The problem is uncontrolled. Exacerbating diseases include diabetes and obesity. Pertinent negatives include no chest pain, myalgias or shortness of breath. Current antihyperlipidemic treatment includes statins. Risk factors for coronary artery disease include dyslipidemia, diabetes  mellitus, hypertension, a sedentary lifestyle and post-menopausal.  Hypertension This is a chronic problem. The current episode started more than 1 year ago. Pertinent negatives include no chest pain, headaches, palpitations or shortness of breath. Risk factors for coronary artery disease include dyslipidemia, diabetes mellitus, obesity, sedentary lifestyle and post-menopausal state. Past treatments include ACE inhibitors.     Review of Systems  Constitutional:  Negative for chills, fever and unexpected weight change.  HENT:  Negative for trouble swallowing and voice change.   Eyes:  Negative for visual disturbance.  Respiratory:  Negative for cough, shortness of breath and wheezing.   Cardiovascular:  Negative for chest pain, palpitations and leg swelling.  Gastrointestinal:  Negative for diarrhea, nausea and vomiting.  Endocrine: Negative for cold intolerance, heat intolerance, polydipsia, polyphagia and polyuria.  Musculoskeletal:  Negative for arthralgias and myalgias.  Skin:  Negative for color change, pallor, rash and wound.  Neurological:  Negative for seizures and headaches.       Right foot drop as of the time of her Whipple surgery.  Psychiatric/Behavioral:  Negative for confusion and suicidal ideas.     Objective:       08/01/2022    8:55 AM 02/07/2022    8:47 AM 11/01/2021   11:01 AM  Vitals with BMI  Height 5\' 4"  5\' 4"  5\' 4"   Weight 187 lbs 188 lbs 6 oz 194 lbs 3 oz  BMI 32.08 32.32 33.32  Systolic 104 102 409  Diastolic 70 58 62  Pulse 80 80 80    BP 104/70   Pulse 80   Ht 5\' 4"  (1.626 m)   Wt 187 lb (84.8 kg)   BMI 32.10 kg/m   Wt Readings from Last 3 Encounters:  08/01/22 187 lb (84.8 kg)  02/07/22 188 lb 6.4 oz (85.5 kg)  11/01/21 194 lb 3.2 oz (88.1 kg)      CMP ( most recent) CMP     Component Value Date/Time   NA 140 07/25/2022 1009   K 4.2 07/25/2022 1009   CL 105 07/25/2022 1009   CO2 22 07/25/2022 1009   GLUCOSE 128 (H) 07/25/2022 1009    GLUCOSE 76 02/25/2019 0505   BUN 12 07/25/2022 1009   CREATININE 0.68 07/25/2022 1009   CALCIUM 9.1 07/25/2022 1009   PROT 7.0 07/25/2022 1009   ALBUMIN 3.7 (L) 07/25/2022 1009   AST 31 07/25/2022 1009   ALT 31 07/25/2022 1009   ALKPHOS 144 (H) 07/25/2022 1009  BILITOT 0.3 07/25/2022 1009   GFRNONAA >60 02/25/2019 0505   GFRAA >60 02/25/2019 0505     Diabetic Labs (most recent): Lab Results  Component Value Date   HGBA1C 9.8 (A) 08/01/2022   HGBA1C 7.4 (A) 02/07/2022   HGBA1C 7.2 (A) 11/01/2021     Lipid Panel ( most recent) Lipid Panel     Component Value Date/Time   CHOL 118 01/28/2022 1010   TRIG 95 01/28/2022 1010   HDL 54 01/28/2022 1010   CHOLHDL 2.2 01/28/2022 1010   LDLCALC 46 01/28/2022 1010   LABVLDL 18 01/28/2022 1010    Thyroid ultrasound on April 04, 2022 IMPRESSION: 1. Overall, similar appearance of the enlarged, heterogeneous and multinodular thyroid gland. 2. Nodules # 4, # 7 and # 8 all demonstrate 1 year of stability but remain consistent with TI-RADS category 4 nodules which warrant continued imaging surveillance. Recommend follow-up ultrasound in 1 year. 3. No new nodules or suspicious features.  Assessment & Plan:   1. Type 2 diabetes mellitus with hyperglycemia, with long-term current use of insulin (HCC)   - Jasmere Tufte has currently uncontrolled symptomatic type 2 DM since  57 years of age.  She presents with her freestyle libre device.  AGP report shows 38% time in range, 27% level 1 hyperglycemia, 35% level 2 hyperglycemia.  She did not document any hypoglycemia.  Her point-of-care A1c is worsening to 9.8% from 7.4%.  She did not call clinic for hyperglycemia.  She is not utilizing her CGM optimally.  CGM active time is 45% She reports exposure to steroids on 2 separate occasions since last visit.  Recent labs reviewed. - I had a long discussion with her about the progressive nature of diabetes and the pathology behind its  complications. -her diabetes is complicated by nonalcoholic fatty liver disease, obesity/sedentary life, comorbid hyperlipidemia, hypertension, history of pancreatic cancer status post pancreatic resection in December 2020 and she remains at a high risk for more acute and chronic complications which include CAD, CVA, CKD, retinopathy, and neuropathy. These are all discussed in detail with her.  - I discussed all available options of managing her diabetes including de-escalation of medications. I have counseled her on diet  and weight management  by adopting a Whole Food , Plant Predominant  ( WFPP) nutrition as recommended by [employer] of Lifestyle Medicine. Patient is encouraged to switch to  unprocessed or minimally processed  complex starch, adequate protein intake (mainly plant source), minimal liquid fat ( mainly vegetable oils), plenty of fruits, and vegetables. -  she is advised to stick to a routine mealtimes to eat 3 complete meals a day and snack only when necessary ( to snack only to correct hypoglycemia BG <70 day time or <100 at night).  In light of her significant metabolic dysfunction including nonalcoholic fatty liver disease, hyperlipidemia, hypertension, obesity, she remains a good candidate for lifestyle meds.  - she acknowledges that there is a room for improvement in her food and drink choices. - Suggestion is made for her to avoid simple carbohydrates  from her diet including Cakes, Sweet Desserts, Ice Cream, Soda (diet and regular), Sweet Tea, Candies, Chips, Cookies, Store Bought Juices, Alcohol , Artificial Sweeteners,  Coffee Creamer, and "Sugar-free" Products, Lemonade. This will help patient to have more stable blood glucose profile and potentially avoid unintended weight gain.  The following Lifestyle Medicine recommendations according to American College of Lifestyle Medicine  [facility]) were discussed and and offered to patient and she  agrees to start the journey:  A.  Whole Foods, Plant-Based Nutrition comprising of fruits and vegetables, plant-based proteins, whole-grain carbohydrates was discussed in detail with the patient.   A list for source of those nutrients were also provided to the patient.  Patient will use only water or unsweetened tea for hydration. B.  The need to stay away from risky substances including alcohol, smoking; obtaining 7 to 9 hours of restorative sleep, at least 150 minutes of moderate intensity exercise weekly, the importance of healthy social connections,  and stress management techniques were discussed. C.  A full color page of  Calorie density of various food groups per pound showing examples of each food groups was provided to the patient.   - she has been scheduled with Norm Salt, RDN, CDE for individualized diabetes education.  - I have approached her with the following plan to manage  her diabetes and patient agrees:   -Based on her presentation with worsening A1c of 9.8%, she was approached for prandial insulin in addition to her basal insulin.  However patient declines this offer for now.  She promises to do better on her diet  to avoid prandial insulin.    -She is advised to continue Tresiba 40 units nightly, encouraged to utilize her CGM for optimal-at least 4 times a day - before meals and at bedtime.  - she is encouraged to call clinic for blood glucose levels less than 70 or above 200 mg /dl weekly average.   -She is not a candidate for non-insulin treatment options.  - Specific targets for  A1c;  LDL, HDL,  and Triglycerides were discussed with the patient.  2) Blood Pressure /Hypertension:  -Her blood pressure is controlled to target.   she is advised to continue her current medications including lisinopril 20 mg p.o. daily, metoprolol 50 mg p.o. daily with breakfast .  3) Lipids/Hyperlipidemia:   Her recent lipid panel showed controlled LDL at 46.  She is advised to continue Crestor 10 mg p.o. nightly.     Whole food plant-based diet described above is highly recommended for her to achieve control of her lipid panel in light of her medical history of nonalcoholic fatty liver disease (metabolic dysfunction associated steatotic liver disease).   4)  Weight/Diet:  Body mass index is 32.1 kg/m.  -   clearly complicating her diabetes care.   she is  a candidate for weight loss. I discussed with her the fact that loss of 5 - 10% of her  current body weight will have the most impact on her diabetes management.  The above detailed  ACLM recommendations for nutrition, exercise, sleep, social life, avoidance of risky substances, the need for restorative sleep   information will also detailed on discharge instructions.   5) multi-nodular goiter: I reviewed her recent thyroid ultrasound which showed 1.1 cm right inferior thyroid nodule, and 1.1 cm left inferior thyroid nodule.  Her March 2024 surveillance thyroid ultrasound showed similar findings.  She will need 1 more imaging to document stability over 5 years.  This will be due in March 2025.   6) Chronic Care/Health Maintenance:  -she  is on ACEI/ARB and Statin medications and  is encouraged to initiate and continue to follow up with Ophthalmology, Dentist,  Podiatrist at least yearly or according to recommendations, and advised to   stay away from smoking. I have recommended yearly flu vaccine and pneumonia vaccine at least every 5 years; moderate intensity exercise for up to 150 minutes  weekly; and  sleep for 7- 9 hours a day.  - she is  advised to maintain close follow up with Melvyn Neth, NP for primary care needs, as well as her other providers for optimal and coordinated care.   I spent  26  minutes in the care of the patient today including review of labs from CMP, Lipids, Thyroid Function, Hematology (current and previous including abstractions from other facilities); face-to-face time discussing  her blood glucose readings/logs, discussing  hypoglycemia and hyperglycemia episodes and symptoms, medications doses, her options of short and long term treatment based on the latest standards of care / guidelines;  discussion about incorporating lifestyle medicine;  and documenting the encounter. Risk reduction counseling performed per USPSTF guidelines to reduce  obesity and cardiovascular risk factors.     Please refer to Patient Instructions for Blood Glucose Monitoring and Insulin/Medications Dosing Guide"  in media tab for additional information. Please  also refer to " Patient Self Inventory" in the Media  tab for reviewed elements of pertinent patient history.  Leah Farmer participated in the discussions, expressed understanding, and voiced agreement with the above plans.  All questions were answered to her satisfaction. she is encouraged to contact clinic should she have any questions or concerns prior to her return visit.     Follow up plan: - No follow-ups on file.  Marquis Lunch, MD [facility] [address] [place], [place] [address] Phone: [phone]  Fax: [phone]    08/01/2022, 9:24 AM  This note was partially dictated with voice recognition software. Similar sounding words can be transcribed inadequately or may not  be corrected upon review.

## 2022-08-01 NOTE — Patient Instructions (Signed)

## 2022-09-12 ENCOUNTER — Encounter (HOSPITAL_COMMUNITY): Payer: Self-pay | Admitting: Gastroenterology

## 2022-09-12 NOTE — Progress Notes (Signed)
Attempted to obtain medical history via telephone, unable to reach at this time. HIPAA compliant voicemail message left requesting return call to pre surgical testing department. 

## 2022-09-18 NOTE — Anesthesia Preprocedure Evaluation (Signed)
Anesthesia Evaluation  Patient identified by MRN, date of birth, ID band Patient awake    Reviewed: Allergy & Precautions, NPO status , Patient's Chart, lab work & pertinent test results  History of Anesthesia Complications (+) PONV and history of anesthetic complications  Airway Mallampati: II  TM Distance: >3 FB Neck ROM: Full    Dental no notable dental hx. (+) Dental Advisory Given, Teeth Intact   Pulmonary sleep apnea , pneumonia, resolved COVID-19+ (07/08/18) with viral pneumonia/hypoxia (s/p remdesivir, steroids, temporary supplemental O2)   Pulmonary exam normal breath sounds clear to auscultation       Cardiovascular hypertension, Pt. on medications and Pt. on home beta blockers Normal cardiovascular exam Rhythm:Regular Rate:Normal  Echo 2021  1. Left ventricular ejection fraction, by estimation, is 60 to 65%. The left ventricle has normal function. The left ventricle has no regional wall motion abnormalities. There is mild left ventricular hypertrophy of the basal-septal segment. Left ventricular diastolic parameters are consistent with Grade I diastolic dysfunction (impaired relaxation).   2. Right ventricular systolic function is normal. The right ventricular size is normal.   3. The mitral valve is grossly normal. No evidence of mitral valve regurgitation.   4. The aortic valve is tricuspid. Aortic valve regurgitation is not visualized. No aortic stenosis is present.   5. The inferior vena cava is normal in size with greater than 50% respiratory variability, suggesting right atrial pressure of 3 mmHg.      Neuro/Psych  PSYCHIATRIC DISORDERS Anxiety Depression    negative neurological ROS     GI/Hepatic Neg liver ROS,GERD  Medicated and Controlled,,Pancreatic ca   Endo/Other  diabetes, Type 2, Oral Hypoglycemic Agents    Renal/GU Renal disease     Musculoskeletal  (+) Arthritis ,    Abdominal   Peds   Hematology negative hematology ROS (+)   Anesthesia Other Findings Day of surgery medications reviewed with patient.  Reproductive/Obstetrics negative OB ROS                             Anesthesia Physical Anesthesia Plan  ASA: 3  Anesthesia Plan: General   Post-op Pain Management: GA combined w/ Regional for post-op pain and Minimal or no pain anticipated   Induction: Intravenous  PONV Risk Score and Plan: 4 or greater and Treatment may vary due to age or medical condition, Ondansetron, Propofol infusion, TIVA and Dexamethasone  Airway Management Planned: Oral ETT  Additional Equipment:   Intra-op Plan:   Post-operative Plan: Extubation in OR  Informed Consent: I have reviewed the patients History and Physical, chart, labs and discussed the procedure including the risks, benefits and alternatives for the proposed anesthesia with the patient or authorized representative who has indicated his/her understanding and acceptance.     Dental advisory given  Plan Discussed with: CRNA  Anesthesia Plan Comments:         Anesthesia Quick Evaluation

## 2022-09-19 ENCOUNTER — Ambulatory Visit (HOSPITAL_BASED_OUTPATIENT_CLINIC_OR_DEPARTMENT_OTHER): Payer: Medicare Other | Admitting: Anesthesiology

## 2022-09-19 ENCOUNTER — Encounter (HOSPITAL_COMMUNITY): Payer: Self-pay | Admitting: Gastroenterology

## 2022-09-19 ENCOUNTER — Encounter (HOSPITAL_COMMUNITY)
Admission: RE | Disposition: A | Discharge status: Home / Self Care | Payer: Self-pay | Source: Home / Self Care | Attending: Gastroenterology

## 2022-09-19 ENCOUNTER — Ambulatory Visit (HOSPITAL_COMMUNITY)
Admission: RE | Admit: 2022-09-19 | Discharge: 2022-09-19 | Disposition: A | Discharge status: Home / Self Care | Payer: Medicare Other | Attending: Gastroenterology | Admitting: Gastroenterology

## 2022-09-19 ENCOUNTER — Other Ambulatory Visit: Payer: Self-pay

## 2022-09-19 ENCOUNTER — Ambulatory Visit (HOSPITAL_COMMUNITY): Payer: Medicare Other | Admitting: Anesthesiology

## 2022-09-19 ENCOUNTER — Ambulatory Visit (HOSPITAL_COMMUNITY): Payer: Medicare Other

## 2022-09-19 DIAGNOSIS — R748 Abnormal levels of other serum enzymes: Secondary | ICD-10-CM | POA: Insufficient documentation

## 2022-09-19 DIAGNOSIS — Z90411 Acquired partial absence of pancreas: Secondary | ICD-10-CM | POA: Insufficient documentation

## 2022-09-19 DIAGNOSIS — G4730 Sleep apnea, unspecified: Secondary | ICD-10-CM | POA: Diagnosis not present

## 2022-09-19 DIAGNOSIS — I119 Hypertensive heart disease without heart failure: Secondary | ICD-10-CM | POA: Diagnosis not present

## 2022-09-19 DIAGNOSIS — K3189 Other diseases of stomach and duodenum: Secondary | ICD-10-CM | POA: Insufficient documentation

## 2022-09-19 DIAGNOSIS — C250 Malignant neoplasm of head of pancreas: Secondary | ICD-10-CM

## 2022-09-19 DIAGNOSIS — Z9689 Presence of other specified functional implants: Secondary | ICD-10-CM

## 2022-09-19 DIAGNOSIS — E1165 Type 2 diabetes mellitus with hyperglycemia: Secondary | ICD-10-CM

## 2022-09-19 DIAGNOSIS — T85590A Other mechanical complication of bile duct prosthesis, initial encounter: Secondary | ICD-10-CM

## 2022-09-19 DIAGNOSIS — Z800 Family history of malignant neoplasm of digestive organs: Secondary | ICD-10-CM | POA: Diagnosis not present

## 2022-09-19 DIAGNOSIS — Z8616 Personal history of COVID-19: Secondary | ICD-10-CM | POA: Insufficient documentation

## 2022-09-19 DIAGNOSIS — R935 Abnormal findings on diagnostic imaging of other abdominal regions, including retroperitoneum: Secondary | ICD-10-CM | POA: Insufficient documentation

## 2022-09-19 DIAGNOSIS — Z8249 Family history of ischemic heart disease and other diseases of the circulatory system: Secondary | ICD-10-CM | POA: Insufficient documentation

## 2022-09-19 DIAGNOSIS — Z4659 Encounter for fitting and adjustment of other gastrointestinal appliance and device: Secondary | ICD-10-CM | POA: Diagnosis present

## 2022-09-19 DIAGNOSIS — I10 Essential (primary) hypertension: Secondary | ICD-10-CM | POA: Diagnosis not present

## 2022-09-19 DIAGNOSIS — E119 Type 2 diabetes mellitus without complications: Secondary | ICD-10-CM | POA: Insufficient documentation

## 2022-09-19 DIAGNOSIS — K2289 Other specified disease of esophagus: Secondary | ICD-10-CM

## 2022-09-19 DIAGNOSIS — Z833 Family history of diabetes mellitus: Secondary | ICD-10-CM | POA: Insufficient documentation

## 2022-09-19 DIAGNOSIS — Z980 Intestinal bypass and anastomosis status: Secondary | ICD-10-CM | POA: Insufficient documentation

## 2022-09-19 DIAGNOSIS — Z4689 Encounter for fitting and adjustment of other specified devices: Secondary | ICD-10-CM

## 2022-09-19 DIAGNOSIS — Z8507 Personal history of malignant neoplasm of pancreas: Secondary | ICD-10-CM | POA: Diagnosis not present

## 2022-09-19 HISTORY — PX: ENDOSCOPIC RETROGRADE CHOLANGIOPANCREATOGRAPHY (ERCP) WITH PROPOFOL: SHX5810

## 2022-09-19 HISTORY — PX: BIOPSY: SHX5522

## 2022-09-19 HISTORY — PX: SUBMUCOSAL TATTOO INJECTION: SHX6856

## 2022-09-19 HISTORY — PX: STENT REMOVAL: SHX6421

## 2022-09-19 LAB — GLUCOSE, CAPILLARY: Glucose-Capillary: 147 mg/dL — ABNORMAL HIGH (ref 70–99)

## 2022-09-19 SURGERY — ENDOSCOPIC RETROGRADE CHOLANGIOPANCREATOGRAPHY (ERCP) WITH PROPOFOL
Anesthesia: General

## 2022-09-19 MED ORDER — SODIUM CHLORIDE 0.9 % IV SOLN
INTRAVENOUS | Status: DC | PRN
  Administered 2022-09-19: 5 mL

## 2022-09-19 MED ORDER — PHENYLEPHRINE 80 MCG/ML (10ML) SYRINGE FOR IV PUSH (FOR BLOOD PRESSURE SUPPORT)
PREFILLED_SYRINGE | INTRAVENOUS | Status: DC | PRN
  Administered 2022-09-19 (×3): 160 ug via INTRAVENOUS

## 2022-09-19 MED ORDER — ROCURONIUM BROMIDE 10 MG/ML (PF) SYRINGE
PREFILLED_SYRINGE | INTRAVENOUS | Status: DC | PRN
  Administered 2022-09-19: 70 mg via INTRAVENOUS

## 2022-09-19 MED ORDER — SODIUM CHLORIDE 0.9 % IV SOLN: INTRAVENOUS | Status: DC

## 2022-09-19 MED ORDER — LACTATED RINGERS IV SOLN: INTRAVENOUS | Status: DC | PRN

## 2022-09-19 MED ORDER — DICLOFENAC SUPPOSITORY 100 MG
RECTAL | Status: AC
  Filled 2022-09-19: qty 1

## 2022-09-19 MED ORDER — ONDANSETRON HCL 4 MG/2ML IJ SOLN
4.0000 mg | Freq: Once | INTRAMUSCULAR | Status: AC
  Administered 2022-09-19: 4 mg via INTRAVENOUS

## 2022-09-19 MED ORDER — LIDOCAINE 2% (20 MG/ML) 5 ML SYRINGE
INTRAMUSCULAR | Status: DC | PRN
  Administered 2022-09-19: 100 mg via INTRAVENOUS

## 2022-09-19 MED ORDER — SPOT INK MARKER SYRINGE KIT
PACK | SUBMUCOSAL | Status: DC | PRN
  Administered 2022-09-19: 4 mL via SUBMUCOSAL

## 2022-09-19 MED ORDER — ONDANSETRON HCL 4 MG/2ML IJ SOLN
INTRAMUSCULAR | Status: AC
  Filled 2022-09-19: qty 2

## 2022-09-19 MED ORDER — SUGAMMADEX SODIUM 200 MG/2ML IV SOLN
INTRAVENOUS | Status: DC | PRN
  Administered 2022-09-19: 200 mg via INTRAVENOUS

## 2022-09-19 MED ORDER — DICLOFENAC SUPPOSITORY 100 MG
RECTAL | Status: DC | PRN
  Administered 2022-09-19: 100 mg via RECTAL

## 2022-09-19 MED ORDER — MIDAZOLAM HCL 2 MG/2ML IJ SOLN
INTRAMUSCULAR | Status: AC
  Filled 2022-09-19: qty 2

## 2022-09-19 MED ORDER — FENTANYL CITRATE (PF) 100 MCG/2ML IJ SOLN
INTRAMUSCULAR | Status: DC | PRN
  Administered 2022-09-19: 100 ug via INTRAVENOUS

## 2022-09-19 MED ORDER — SODIUM CHLORIDE 0.9 % IV SOLN
1.5000 g | Freq: Once | INTRAVENOUS | Status: AC
  Administered 2022-09-19: 1.5 g via INTRAVENOUS
  Filled 2022-09-19: qty 4

## 2022-09-19 MED ORDER — DEXAMETHASONE SODIUM PHOSPHATE 10 MG/ML IJ SOLN
INTRAMUSCULAR | Status: DC | PRN
  Administered 2022-09-19: 5 mg via INTRAVENOUS

## 2022-09-19 MED ORDER — FENTANYL CITRATE (PF) 100 MCG/2ML IJ SOLN
INTRAMUSCULAR | Status: AC
  Filled 2022-09-19: qty 2

## 2022-09-19 MED ORDER — ONDANSETRON HCL 4 MG/2ML IJ SOLN
INTRAMUSCULAR | Status: DC | PRN
  Administered 2022-09-19: 4 mg via INTRAVENOUS

## 2022-09-19 MED ORDER — PROPOFOL 10 MG/ML IV BOLUS
INTRAVENOUS | Status: AC
  Filled 2022-09-19: qty 20

## 2022-09-19 MED ORDER — GLUCAGON HCL RDNA (DIAGNOSTIC) 1 MG IJ SOLR
INTRAMUSCULAR | Status: AC
  Filled 2022-09-19: qty 2

## 2022-09-19 MED ORDER — PROPOFOL 500 MG/50ML IV EMUL
INTRAVENOUS | Status: AC
  Filled 2022-09-19: qty 50

## 2022-09-19 MED ORDER — ONDANSETRON 8 MG PO TBDP: 8.0000 mg | ORAL_TABLET | Freq: Three times a day (TID) | ORAL | 0 refills | Status: AC | PRN

## 2022-09-19 MED ORDER — MIDAZOLAM HCL 5 MG/5ML IJ SOLN
INTRAMUSCULAR | Status: DC | PRN
  Administered 2022-09-19: 2 mg via INTRAVENOUS

## 2022-09-19 MED ORDER — PROPOFOL 10 MG/ML IV BOLUS
INTRAVENOUS | Status: DC | PRN
Start: 2022-09-19 — End: 2022-09-19
  Administered 2022-09-19: 140 mg via INTRAVENOUS

## 2022-09-19 MED ORDER — MEPERIDINE HCL 50 MG/ML IJ SOLN: 6.2500 mg | INTRAMUSCULAR | Status: DC | PRN

## 2022-09-19 MED ORDER — DROPERIDOL 2.5 MG/ML IJ SOLN: 0.6250 mg | Freq: Once | INTRAMUSCULAR | Status: DC | PRN

## 2022-09-19 MED ORDER — FENTANYL CITRATE (PF) 100 MCG/2ML IJ SOLN: 25.0000 ug | INTRAMUSCULAR | Status: DC | PRN

## 2022-09-19 NOTE — H&P (Signed)
GASTROENTEROLOGY PROCEDURE H&P NOTE   Primary Care Physician: Melvyn Neth, NP  HPI: Leah Farmer is a 57 y.o. female who presents for ERCP for attempt at bile duct and pacnreatic stent removals from prior Whipple.  Past Medical History:  Diagnosis Date   Anxiety    Arthritis 2020   lower back and hip; takes only OTC Motrin   Depression    mild and situational; not medicated   Diabetes mellitus without complication (HCC)    unsure if type 1 or 2 , dx as an adult    GERD (gastroesophageal reflux disease)    History of kidney stones 07/2018   hospitalized at [facility]; passed on her own   Hypertension    NAFL (nonalcoholic fatty liver) 07/08/2018   fatty liver seen on CT scan abd/ pelvis   Nephrolithiasis    lov in june 2020 , also had covid at this time    pancreatic ca dx'd 08/2018   pancreatic   Pneumonia    PONV (postoperative nausea and vomiting)    Tachycardia    generally runs in the 120s   Past Surgical History:  Procedure Laterality Date   APPENDECTOMY  in 67s   CHOLECYSTECTOMY  in 20s   ESOPHAGOGASTRODUODENOSCOPY (EGD) WITH PROPOFOL N/A 11/01/2018   Procedure: ESOPHAGOGASTRODUODENOSCOPY (EGD) WITH PROPOFOL;  Surgeon: Rachael Fee, MD;  Location: [facility];  Service: Endoscopy;  Laterality: N/A;   EUS N/A 11/01/2018   Procedure: UPPER ENDOSCOPIC ULTRASOUND (EUS) LINEAR;  Surgeon: Rachael Fee, MD;  Location: [facility];  Service: Endoscopy;  Laterality: N/A;   FINE NEEDLE ASPIRATION N/A 11/01/2018   Procedure: FINE NEEDLE ASPIRATION (FNA) LINEAR;  Surgeon: Rachael Fee, MD;  Location: [facility];  Service: Endoscopy;  Laterality: N/A;   IR CATHETER TUBE CHANGE  02/16/2019   IR RADIOLOGIST EVAL & MGMT  03/14/2019   IR THORACENTESIS ASP PLEURAL SPACE W/IMG GUIDE  01/21/2019   LAPAROSCOPY N/A 01/15/2019   Procedure: LAPAROSCOPY DIAGNOSTIC;  Surgeon: Almond Lint, MD;  Location: [facility] OR;  Service: General;  Laterality: N/A;   WHIPPLE PROCEDURE N/A  01/15/2019   Procedure: WHIPPLE PROCEDURE;  Surgeon: Almond Lint, MD;  Location: [facility] OR;  Service: General;  Laterality: N/A;   Current Facility-Administered Medications  Medication Dose Route Frequency Provider Last Rate Last Admin   0.9 %  sodium chloride infusion   Intravenous Continuous Mansouraty, Netty Starring., MD       ampicillin-sulbactam (UNASYN) 1.5 g in sodium chloride 0.9 % 100 mL IVPB  1.5 g Intravenous Once Mansouraty, Netty Starring., MD       droperidol (INAPSINE) 2.5 MG/ML injection 0.625 mg  0.625 mg Intravenous Once PRN Lewie Loron, MD       fentaNYL (SUBLIMAZE) injection 25-50 mcg  25-50 mcg Intravenous Q5 min PRN Lewie Loron, MD       meperidine (DEMEROL) injection 6.25-12.5 mg  6.25-12.5 mg Intravenous Q5 min PRN Lewie Loron, MD        Current Facility-Administered Medications:    0.9 %  sodium chloride infusion, , Intravenous, Continuous, Mansouraty, Netty Starring., MD   ampicillin-sulbactam (UNASYN) 1.5 g in sodium chloride 0.9 % 100 mL IVPB, 1.5 g, Intravenous, Once, Mansouraty, Netty Starring., MD   droperidol (INAPSINE) 2.5 MG/ML injection 0.625 mg, 0.625 mg, Intravenous, Once PRN, Lewie Loron, MD   fentaNYL (SUBLIMAZE) injection 25-50 mcg, 25-50 mcg, Intravenous, Q5 min PRN, Lewie Loron, MD   meperidine (DEMEROL) injection 6.25-12.5 mg, 6.25-12.5 mg, Intravenous, Q5 min PRN, Lewie Loron,  MD Allergies  Allergen Reactions   Ciprofloxacin Hives   Family History  Problem Relation Age of Onset   Hypertension Mother    Diabetes Mother    Pancreatic cancer Mother    Hypertension Father    Diabetes Father    Esophageal cancer Maternal Uncle        Passed at 76. Found colon cancer on colonoscopy at that time.    Colon cancer Neg Hx    Colon polyps Neg Hx    Social History   Socioeconomic History   Marital status: Married    Spouse name: Not on file   Number of children: Not on file   Years of education: Not on file   Highest education level: Not  on file  Occupational History   Not on file  Tobacco Use   Smoking status: Never   Smokeless tobacco: Never  Vaping Use   Vaping status: Never Used  Substance and Sexual Activity   Alcohol use: Never   Drug use: Never   Sexual activity: Not Currently  Other Topics Concern   Not on file  Social History Narrative   Not on file   Social Determinants of Health   Financial Resource Strain: Not on file  Food Insecurity: Not on file  Transportation Needs: Not on file  Physical Activity: Not on file  Stress: Not on file  Social Connections: Not on file  Intimate Partner Violence: Not on file    Physical Exam: Today's Vitals   09/19/22 0705  BP: 122/60  Pulse: 95  Resp: 10  Temp: 98.4 F (36.9 C)  TempSrc: Tympanic  SpO2: 100%  Weight: 84.8 kg  Height: 5\' 4"  (1.626 m)  PainSc: 0-No pain   Body mass index is 32.09 kg/m. GEN: NAD EYE: Sclerae anicteric ENT: MMM CV: Non-tachycardic GI: Soft, NT/ND NEURO:  Alert & Oriented x 3  Lab Results: No results for input(s): "WBC", "HGB", "HCT", "PLT" in the last 72 hours. BMET No results for input(s): "NA", "K", "CL", "CO2", "GLUCOSE", "BUN", "CREATININE", "CALCIUM" in the last 72 hours. LFT No results for input(s): "PROT", "ALBUMIN", "AST", "ALT", "ALKPHOS", "BILITOT", "BILIDIR", "IBILI" in the last 72 hours. PT/INR No results for input(s): "LABPROT", "INR" in the last 72 hours.   Impression / Plan: This is a 57 y.o.female who presents for ERCP for attempt at bile duct and pacnreatic stent removals from prior Whipple.  The risks of an ERCP were discussed at length, including but not limited to the risk of perforation, bleeding, abdominal pain, post-ERCP pancreatitis (while usually mild can be severe and even life threatening).   The risks and benefits of endoscopic evaluation/treatment were discussed with the patient and/or family; these include but are not limited to the risk of perforation, infection, bleeding, missed  lesions, lack of diagnosis, severe illness requiring hospitalization, as well as anesthesia and sedation related illnesses.  The patient's history has been reviewed, patient examined, no change in status, and deemed stable for procedure.  The patient and/or family is agreeable to proceed.    Corliss Parish, MD [facility] Office # [phone]

## 2022-09-19 NOTE — Discharge Instructions (Signed)

## 2022-09-19 NOTE — Anesthesia Postprocedure Evaluation (Signed)
Anesthesia Post Note  Patient: Leah Farmer  Procedure(s) Performed: ENDOSCOPIC RETROGRADE CHOLANGIOPANCREATOGRAPHY (ERCP) WITH PROPOFOL BIOPSY STENT REMOVAL SUBMUCOSAL TATTOO INJECTION     Patient location during evaluation: PACU Anesthesia Type: General Level of consciousness: sedated and patient cooperative Pain management: pain level controlled Vital Signs Assessment: post-procedure vital signs reviewed and stable Respiratory status: spontaneous breathing Cardiovascular status: stable Anesthetic complications: no   No notable events documented.  Last Vitals:  Vitals:   09/19/22 1050 09/19/22 1100  BP: 113/64 113/62  Pulse: 77 73  Resp: 11 14  Temp:    SpO2: 100% 99%    Last Pain:  Vitals:   09/19/22 1008  TempSrc:   PainSc: 0-No pain                 Lewie Loron

## 2022-09-19 NOTE — Op Note (Signed)
[facility] Patient Name: Leah Farmer Procedure Date: 09/19/2022 MRN: [id number] Attending MD: Corliss Parish , MD, [id number] Date of Birth: 01-07-1966 CSN: [id number] Age: 57 Admit Type: Outpatient Procedure:                ERCP Indications:              Abnormal abdominal CT, Elevated alkaline                            phosphatase, Biliary stent removal, Pancreatic                            stent removal Providers:                Corliss Parish, MD, Norman Clay, RN, Beryle Beams, Technician, Albertina Senegal. Alday CRNA, CRNA Referring MD:             Almond Lint MD, MD, Melvyn Neth Medicines:                General Anesthesia, Unasyn 1.5 g IV, Diclofenac 100                            mg rectal Complications:            No immediate complications. Estimated Blood Loss:     Estimated blood loss: none. Procedure:                Pre-Anesthesia Assessment:                           - Prior to the procedure, a History and Physical                            was performed, and patient medications and                            allergies were reviewed. The patient's tolerance of                            previous anesthesia was also reviewed. The risks                            and benefits of the procedure and the sedation                            options and risks were discussed with the patient.                            All questions were answered, and informed consent                            was obtained. Prior Anticoagulants: The patient has  taken no anticoagulant or antiplatelet agents. ASA                            Grade Assessment: III - A patient with severe                            systemic disease. After reviewing the risks and                            benefits, the patient was deemed in satisfactory                            condition to undergo the procedure.                            After obtaining informed consent, the scope was                            passed under direct vision. Throughout the                            procedure, the patient's blood pressure, pulse, and                            oxygen saturations were monitored continuously. The                            [id number]-[id number] ([id number]) Olympus therapeutic endoscope                            was introduced through the mouth, and used to                            inject contrast into without successful                            cannulation. The [id number]-[id number] ([id number]) Olympus                            colonoscope was introduced through the mouth, and                            used to inject contrast into and used to inject                            contrast into the ventral pancreatic duct. The                            [id number]-[id number] ([id number]) Olympus duodenoscope was                            introduced through the mouth, and used to inject  contrast into without successful cannulation. The                            ERCP was determined to be ASGE Difficulty Grade 3                            due to challenging cannulation because of abnormal                            anatomy and significant looping. Successful                            completion of the procedure was aided by changing                            the patient to a supine position, using manual                            pressure, withdrawing and reinserting the scope,                            withdrawing the scope and replacing with the                            pediatric colonoscope, straightening and shortening                            the scope to obtain bowel loop reduction and using                            scope torsion. The patient tolerated the procedure. Scope In: Scope Out: Findings:      A scout film of the abdomen was obtained. Two stents 1 ending in the       main bile duct and one  ending in the pancreatic duct were seen.      Initially a therapeutic EGD was used for the examination of the upper       gastrointestinal tract. The scope was passed under direct vision through       the upper GI tract. No gross lesions were noted in the entire esophagus.       The Z-line was irregular and was found 37 cm from the incisors. Striped       moderately erythematous mucosa was found in the entire examined stomach       (biopsied for H. pylori assessment). Evidence of a gastrojejunostomy was       found in the gastric body. This was characterized by healthy appearing       mucosa. Normal mucosa was found in the efferent jejunum. Normal mucosa       was found in the efferent jejunum. Eventually I got to the end of the       therapeutic endoscope and could not go any further as a result of       looping and end of the therapeutic endoscope. I then transition to a       pediatric colonoscope and had the same issue with the patient in  typical       prone ERCP position. After extensive time, we transition the patient to       a supine position and after placing significant pressure in her mid       abdomen we were able to gently pass the pediatric colonoscope deeper       into the efferent jejunum and eventually we found one plastic pancreatic       stent originating in the pancreatic duct was emerging from the       pancreaticojejunostomy. This stent was removed from the pancreatic duct       using a Raptor grasping device and we had to remove the entirety of the       scope even in this difficult position to remove the stent. We then       reinitiated our challenge of trying to get back to that particular       region to evaluate and look for the choledochojejunostomy. Using       fluoroscopy and over extended period of time, we could not visualize any       biliary drainage. As we look at the fluoroscopy, I could see and air       cholangiogram develop in what appears to be a stent  within the more       proximal aspect of the biliary tree. Thus my concern is that the       previously placed biliary stent, looks to still be in the duct, it is       likely a result of an anastomosis narrowing/stricture that cannot be       seen.      After extensive time, the bile duct could not be cannulated. I did place       tattoo in the efferent limb for demarcation purposes in the future of       other attempts at ERCP. The pediatric colonoscope was withdrawn from the       patient. Impression:               - No gross lesions in the entire esophagus. Z-line                            irregular, 37 cm from the incisors.                           - Erythematous mucosa in the stomach -biopsied for                            H. pylori assessment.                           - A gastrojejunostomy was found, characterized by                            healthy appearing mucosa.                           - Normal mucosa was found in the visualized                            efferent jejunum.                           -  Normal mucosa was found in the visualized                            efferent jejunum.                           - One stent from the pancreatic duct was seen at                            the pancreaticojejunostomy and was removed.                           - Extensive time to try to localize the                            choledochojejunostomy, and we could not visualize                            this area endoscopically. Fluoroscopy suggested                            that we were very near this area but I could not                            get any visualization of a stent or bile drainage.                           - It seems that the previously placed biliary stent                            has migrated proximally and what is likely a                            choledocho jejunostomy anastomosis                            narrowing/stricture. Moderate  Sedation:      Not Applicable - Patient had care per Anesthesia. Recommendation:           - The patient will be observed post-procedure,                            until all discharge criteria are met.                           - Discharge patient to home.                           - Patient has a contact number available for                            emergencies. The signs and symptoms of potential                            delayed complications  were discussed with the                            patient. Return to normal activities tomorrow.                            Written discharge instructions were provided to the                            patient.                           - Low fat diet.                           - Watch for pancreatitis, bleeding, perforation,                            and cholangitis.                           - Observe patient's clinical course.                           - Check liver enzymes (AST, ALT, alkaline                            phosphatase, bilirubin) in 2 weeks.                           - Referral to [facility] for Device Assisted ERCP for                            further attempts at stable positioning and                            evaluation of the choledochojejunostomy with likely                            need for dilation and then stent retrieval as it                            seems that the stent has likely migrated proximally                            with a probably hepaticojejunostomy                            stenosis/narrowing.                           - Colonoscopy should be pursued this year for colon                            cancer screening.                           -  The findings and recommendations were discussed                            with the patient.                           - The findings and recommendations were discussed                            with the patient's family. Procedure Code(s):        ---  Professional ---                           [phone], 52, Endoscopic retrograde                            cholangiopancreatography (ERCP); with removal of                            foreign body(s) or stent(s) from biliary/pancreatic                            duct(s) Diagnosis Code(s):        --- Professional ---                           K22.89, Other specified disease of esophagus                           K31.89, Other diseases of stomach and duodenum                           Z98.0, Intestinal bypass and anastomosis status                           Z96.89, Presence of other specified functional                            implants                           Z46.59, Encounter for fitting and adjustment of                            other gastrointestinal appliance and device                           R74.8, Abnormal levels of other serum enzymes                           R93.5, Abnormal findings on diagnostic imaging of                            other abdominal regions, including retroperitoneum CPT copyright 2022 American Medical Association. All rights reserved. The codes documented in this report are preliminary and upon coder review may  be revised to meet current compliance requirements. [employer],  MD 09/19/2022 10:16:43 AM Number of Addenda: 0

## 2022-09-19 NOTE — Transfer of Care (Signed)
Immediate Anesthesia Transfer of Care Note  Patient: Leah Farmer  Procedure(s) Performed: ENDOSCOPIC RETROGRADE CHOLANGIOPANCREATOGRAPHY (ERCP) WITH PROPOFOL BIOPSY STENT REMOVAL SUBMUCOSAL TATTOO INJECTION  Patient Location: PACU  Anesthesia Type:General  Level of Consciousness: sedated  Airway & Oxygen Therapy: Patient Spontanous Breathing and Patient connected to face mask oxygen  Post-op Assessment: Report given to RN and Post -op Vital signs reviewed and stable  Post vital signs: Reviewed and stable  Last Vitals:  Vitals Value Taken Time  BP    Temp    Pulse    Resp 0 09/19/22 1001  SpO2    Vitals shown include unfiled device data.  Last Pain:  Vitals:   09/19/22 0705  TempSrc: Tympanic  PainSc: 0-No pain         Complications: No notable events documented.

## 2022-09-19 NOTE — Anesthesia Procedure Notes (Signed)
Procedure Name: Intubation Date/Time: 09/19/2022 8:12 AM  Performed by: Doran Clay, CRNAPre-anesthesia Checklist: Patient identified, Emergency Drugs available, Suction available, Patient being monitored and Timeout performed Patient Re-evaluated:Patient Re-evaluated prior to induction Oxygen Delivery Method: Circle system utilized Preoxygenation: Pre-oxygenation with 100% oxygen Induction Type: IV induction Ventilation: Mask ventilation without difficulty Laryngoscope Size: Mac and 4 Grade View: Grade I Tube type: Oral Tube size: 7.0 mm Number of attempts: 1 Airway Equipment and Method: Stylet Placement Confirmation: ETT inserted through vocal cords under direct vision, positive ETCO2 and breath sounds checked- equal and bilateral Secured at: 21 cm Tube secured with: Tape Dental Injury: Teeth and Oropharynx as per pre-operative assessment

## 2022-09-20 ENCOUNTER — Encounter: Payer: Self-pay | Admitting: Gastroenterology

## 2022-09-20 LAB — SURGICAL PATHOLOGY

## 2022-09-23 ENCOUNTER — Encounter (HOSPITAL_COMMUNITY): Payer: Self-pay | Admitting: Gastroenterology

## 2022-09-23 ENCOUNTER — Telehealth: Payer: Self-pay

## 2022-09-23 DIAGNOSIS — C250 Malignant neoplasm of head of pancreas: Secondary | ICD-10-CM

## 2022-09-23 NOTE — Telephone Encounter (Signed)
I spoke with the pt and advised the conversation per Dr Meridee Score and Dr Donell Beers.  She agrees to the repeat lab but states she does not wish to see anyone at [facility]. She tells me that she is going to speak with Dr Donell Beers about other options.  FYI Dr Meridee Score

## 2022-09-23 NOTE — Telephone Encounter (Signed)
-----   Message from [facility] sent at 09/23/2022  5:43 AM EDT ----- Regarding: RE: Followup and Next steps FB, Thanks for the follow-up.  Kaion Tisdale, Please let the patient know that Dr. Donell Beers and I have discussed her recent ERCP from this week.  We are going to refer her to [facility] to attempt device assisted ERCP removal of the retained CBD stone for elevated alkaline phosphatase (any of my former colleagues). Patient needs repeat LFTs in 2 to 4 weeks. Thanks. GM ----- Message ----- From: Almond Lint, MD Sent: 09/22/2022   5:21 PM EDT To: Lemar Lofty., MD Subject: RE: Followup and Next steps                    Yes, that sounds great.  Fb ----- Message ----- From: Lemar Lofty., MD Sent: 09/19/2022  11:59 AM EDT To: Almond Lint, MD Subject: Followup and Next steps                        FB, Hope you are doing well. I did on mutual patient's ERCP today.  Please see my note in chart for full details.  In summary, with a lot of difficulty we finally got to the pancreaticojejunostomy where I did see the previously placed stent at the time of Whipple and I removed it.  After a lot of finagling we got her way back to the area but I could not visualize the choledocho jejunostomy.  On fluoroscopy I can see imaging that suggests that the previously placed stent is still in the bile duct tree.  I think what is happening is we have gotten a stenosis at the choledocho jejunostomy and the stent never made its way out.  Unfortunately my stability with the pediatric colonoscope did not allow me to further access or find the true choledochojejunostomy.  I think a device assisted ERCP may be helpful to try to get this stability, if they cannot get it at [facility], then the question of trying to perform a hepatic gastrostomy to subsequently place a wire through the liver into the bile duct and out into the jejunum to then go and [place]. Alk phos is only slightly  elevated so I am not sure how aggressive we want to be, but obviously if we can get an attempt at removing the prosthesis it will also decrease risk of infection down the road too. Patient and husband were in agreement and okay with referral.  Are you okay with this?  Sorry I could only get one of the stents out. GM

## 2022-09-23 NOTE — Telephone Encounter (Signed)
Left message on machine to call back  Lab order entered

## 2022-10-10 ENCOUNTER — Other Ambulatory Visit (INDEPENDENT_AMBULATORY_CARE_PROVIDER_SITE_OTHER): Payer: Medicare Other

## 2022-10-10 DIAGNOSIS — C250 Malignant neoplasm of head of pancreas: Secondary | ICD-10-CM | POA: Diagnosis not present

## 2022-10-10 LAB — HEPATIC FUNCTION PANEL
ALT: 22 U/L (ref 0–35)
AST: 18 U/L (ref 0–37)
Albumin: 3.3 g/dL — ABNORMAL LOW (ref 3.5–5.2)
Alkaline Phosphatase: 131 U/L — ABNORMAL HIGH (ref 39–117)
Bilirubin, Direct: 0.1 mg/dL (ref 0.0–0.3)
Total Bilirubin: 0.3 mg/dL (ref 0.2–1.2)
Total Protein: 6.6 g/dL (ref 6.0–8.3)

## 2022-10-11 ENCOUNTER — Telehealth: Payer: Self-pay | Admitting: Gastroenterology

## 2022-10-11 NOTE — Telephone Encounter (Signed)
Inbound call from patient stating that she seen the results on Mychart from her labs. Just wanted to make sure that Dr. Katherine Roan was aware. Patient also stated that she will be seeing her cancer surgeon October  and will discuss the second procedure referral with her due to him not being able to do the procedure at [facility].

## 2022-10-12 NOTE — Telephone Encounter (Signed)
Noted  

## 2022-11-07 ENCOUNTER — Ambulatory Visit: Payer: Medicare Other | Admitting: "Endocrinology

## 2022-11-07 ENCOUNTER — Encounter: Payer: Self-pay | Admitting: "Endocrinology

## 2022-11-07 VITALS — BP 118/72 | HR 80 | Ht 64.0 in | Wt 186.6 lb

## 2022-11-07 DIAGNOSIS — E782 Mixed hyperlipidemia: Secondary | ICD-10-CM | POA: Diagnosis not present

## 2022-11-07 DIAGNOSIS — I10 Essential (primary) hypertension: Secondary | ICD-10-CM | POA: Diagnosis not present

## 2022-11-07 DIAGNOSIS — K760 Fatty (change of) liver, not elsewhere classified: Secondary | ICD-10-CM

## 2022-11-07 DIAGNOSIS — Z794 Long term (current) use of insulin: Secondary | ICD-10-CM

## 2022-11-07 DIAGNOSIS — E1165 Type 2 diabetes mellitus with hyperglycemia: Secondary | ICD-10-CM

## 2022-11-07 LAB — POCT GLYCOSYLATED HEMOGLOBIN (HGB A1C): HbA1c, POC (controlled diabetic range): 8.3 % — AB (ref 0.0–7.0)

## 2022-11-07 MED ORDER — OZEMPIC (0.25 OR 0.5 MG/DOSE) 2 MG/3ML ~~LOC~~ SOPN: 0.2500 mg | PEN_INJECTOR | SUBCUTANEOUS | 3 refills | Status: DC

## 2022-11-07 MED ORDER — TRESIBA FLEXTOUCH 100 UNIT/ML ~~LOC~~ SOPN: 30.0000 [IU] | PEN_INJECTOR | Freq: Every day | SUBCUTANEOUS | 1 refills | Status: DC

## 2022-11-07 NOTE — Patient Instructions (Signed)
                                     Advice for Weight Management  -For most of us the best way to lose weight is by diet management. Generally speaking, diet management means consuming less calories intentionally which over time brings about progressive weight loss.  This can be achieved more effectively by avoiding ultra processed carbohydrates, processed meats, unhealthy fats.    It is critically important to know your numbers: how much calorie you are consuming and how much calorie you need. More importantly, our carbohydrates sources should be unprocessed naturally occurring  complex starch food items.  It is always important to balance nutrition also by  appropriate intake of proteins (mainly plant-based), healthy fats/oils, plenty of fruits and vegetables.   -The American College of Lifestyle Medicine (ACL M) recommends nutrition derived mostly from Whole Food, Plant Predominant Sources example an apple instead of applesauce or apple pie. Eat Plenty of vegetables, Mushrooms, fruits, Legumes, Whole Grains, Nuts, seeds in lieu of processed meats, processed snacks/pastries red meat, poultry, eggs.  Use only water or unsweetened tea for hydration.  The College also recommends the need to stay away from risky substances including alcohol, smoking; obtaining 7-9 hours of restorative sleep, at least 150 minutes of moderate intensity exercise weekly, importance of healthy social connections, and being mindful of stress and seek help when it is overwhelming.    -Sticking to a routine mealtime to eat 3 meals a day and avoiding unnecessary snacks is shown to have a big role in weight control. Under normal circumstances, the only time we burn stored energy is when we are hungry, so allow  some hunger to take place- hunger means no food between appropriate meal times, only water.  It is not advisable to starve.   -It is better to avoid simple carbohydrates including:  Cakes, Sweet Desserts, Ice Cream, Soda (diet and regular), Sweet Tea, Candies, Chips, Cookies, Store Bought Juices, Alcohol in Excess of  1-2 drinks a day, Lemonade,  Artificial Sweeteners, Doughnuts, Coffee Creamers, "Sugar-free" Products, etc, etc.  This is not a complete list.....    -Consulting with certified diabetes educators is proven to provide you with the most accurate and current information on diet.  Also, you may be  interested in discussing diet options/exchanges , we can schedule a visit with Leah Farmer, RDN, CDE for individualized nutrition education.  -Exercise: If you are able: 30 -60 minutes a day ,4 days a week, or 150 minutes of moderate intensity exercise weekly.    The longer the better if tolerated.  Combine stretch, strength, and aerobic activities.  If you were told in the past that you have high risk for cardiovascular diseases, or if you are currently symptomatic, you may seek evaluation by your heart doctor prior to initiating moderate to intense exercise programs.                                  Additional Care Considerations for Diabetes/Prediabetes   -Diabetes  is a chronic disease.  The most important care consideration is regular follow-up with your diabetes care provider with the goal being avoiding or delaying its complications and to take advantage of advances in medications and technology.  If appropriate actions are taken early enough, type 2 diabetes can even be   reversed.  Seek information from the right source.  - Whole Food, Plant Predominant Nutrition is highly recommended: Eat Plenty of vegetables, Mushrooms, fruits, Legumes, Whole Grains, Nuts, seeds in lieu of processed meats, processed snacks/pastries red meat, poultry, eggs as recommended by American College of  Lifestyle Medicine (ACLM).  -Type 2 diabetes is known to coexist with other important comorbidities such as high blood pressure and high cholesterol.  It is critical to control not only the  diabetes but also the high blood pressure and high cholesterol to minimize and delay the risk of complications including coronary artery disease, stroke, amputations, blindness, etc.  The good news is that this diet recommendation for type 2 diabetes is also very helpful for managing high cholesterol and high blood blood pressure.  - Studies showed that people with diabetes will benefit from a class of medications known as ACE inhibitors and statins.  Unless there are specific reasons not to be on these medications, the standard of care is to consider getting one from these groups of medications at an optimal doses.  These medications are generally considered safe and proven to help protect the heart and the kidneys.    - People with diabetes are encouraged to initiate and maintain regular follow-up with eye doctors, foot doctors, dentists , and if necessary heart and kidney doctors.     - It is highly recommended that people with diabetes quit smoking or stay away from smoking, and get yearly  flu vaccine and pneumonia vaccine at least every 5 years.  See above for additional recommendations on exercise, sleep, stress management , and healthy social connections.      

## 2022-11-07 NOTE — Progress Notes (Signed)
11/07/2022, 9:48 AM   Subjective:    Patient ID: Leah Farmer, female    DOB: August 01, 1965.  Avon Lasyone is being seen in follow-up after she was seen in consultation for management of currently uncontrolled symptomatic diabetes requested by  Melvyn Neth, NP.   Past Medical History:  Diagnosis Date   Anxiety    Arthritis 2020   lower back and hip; takes only OTC Motrin   Depression    mild and situational; not medicated   Diabetes mellitus without complication (HCC)    unsure if type 1 or 2 , dx as an adult    GERD (gastroesophageal reflux disease)    History of kidney stones 07/2018   hospitalized at [facility]; passed on her own   Hypertension    NAFL (nonalcoholic fatty liver) 07/08/2018   fatty liver seen on CT scan abd/ pelvis   Nephrolithiasis    lov in june 2020 , also had covid at this time    pancreatic ca dx'd 08/2018   pancreatic   Pneumonia    PONV (postoperative nausea and vomiting)    Tachycardia    generally runs in the 120s    Past Surgical History:  Procedure Laterality Date   APPENDECTOMY  in 56s   BIOPSY  09/19/2022   Procedure: BIOPSY;  Surgeon: Lemar Lofty., MD;  Location: [facility];  Service: Gastroenterology;;   CHOLECYSTECTOMY  in 56s   ENDOSCOPIC RETROGRADE CHOLANGIOPANCREATOGRAPHY (ERCP) WITH PROPOFOL N/A 09/19/2022   Procedure: ENDOSCOPIC RETROGRADE CHOLANGIOPANCREATOGRAPHY (ERCP) WITH PROPOFOL;  Surgeon: Lemar Lofty., MD;  Location: [facility];  Service: Gastroenterology;  Laterality: N/A;   ESOPHAGOGASTRODUODENOSCOPY (EGD) WITH PROPOFOL N/A 11/01/2018   Procedure: ESOPHAGOGASTRODUODENOSCOPY (EGD) WITH PROPOFOL;  Surgeon: Rachael Fee, MD;  Location: [facility];  Service: Endoscopy;  Laterality: N/A;   EUS N/A 11/01/2018   Procedure: UPPER ENDOSCOPIC ULTRASOUND (EUS) LINEAR;  Surgeon: Rachael Fee, MD;  Location: [facility];  Service: Endoscopy;  Laterality: N/A;   FINE NEEDLE  ASPIRATION N/A 11/01/2018   Procedure: FINE NEEDLE ASPIRATION (FNA) LINEAR;  Surgeon: Rachael Fee, MD;  Location: [facility];  Service: Endoscopy;  Laterality: N/A;   IR CATHETER TUBE CHANGE  02/16/2019   IR RADIOLOGIST EVAL & MGMT  03/14/2019   IR THORACENTESIS ASP PLEURAL SPACE W/IMG GUIDE  01/21/2019   LAPAROSCOPY N/A 01/15/2019   Procedure: LAPAROSCOPY DIAGNOSTIC;  Surgeon: Almond Lint, MD;  Location: [facility];  Service: General;  Laterality: N/A;   STENT REMOVAL  09/19/2022   Procedure: STENT REMOVAL;  Surgeon: Lemar Lofty., MD;  Location: [facility];  Service: Gastroenterology;;  pancreatic stent   SUBMUCOSAL TATTOO INJECTION  09/19/2022   Procedure: SUBMUCOSAL TATTOO INJECTION;  Surgeon: Lemar Lofty., MD;  Location: [facility];  Service: Gastroenterology;;   [facility] PROCEDURE N/A 01/15/2019   Procedure: WHIPPLE PROCEDURE;  Surgeon: Almond Lint, MD;  Location: [facility];  Service: General;  Laterality: N/A;    Social History   Socioeconomic History   Marital status: Married    Spouse name: Not on file   Number of children: Not on file   Years of education: Not on file   Highest education level: Not on file  Occupational History   Not on file  Tobacco Use   Smoking status: Never   Smokeless tobacco: Never  Vaping Use   Vaping status: Never Used  Substance and Sexual Activity   Alcohol use: Never   Drug use: Never  Sexual activity: Not Currently  Other Topics Concern   Not on file  Social History Narrative   Not on file   Social Determinants of Health   Financial Resource Strain: Not on file  Food Insecurity: Not on file  Transportation Needs: Not on file  Physical Activity: Not on file  Stress: Not on file  Social Connections: Not on file    Family History  Problem Relation Age of Onset   Hypertension Mother    Diabetes Mother    Pancreatic cancer Mother    Hypertension Father    Diabetes Father    Esophageal cancer Maternal Uncle         Passed at 29. Found colon cancer on colonoscopy at that time.    Colon cancer Neg Hx    Colon polyps Neg Hx     Outpatient Encounter Medications as of 11/07/2022  Medication Sig   clonazePAM (KLONOPIN) 0.5 MG tablet Take 0.25-0.5 mg by mouth daily as needed for anxiety.   Continuous Blood Gluc Receiver (FREESTYLE LIBRE 2 READER) DEVI As directed   Continuous Blood Gluc Sensor (FREESTYLE LIBRE 2 SENSOR) MISC 1 Piece by Does not apply route every 14 (fourteen) days.   insulin degludec (TRESIBA FLEXTOUCH) 100 UNIT/ML FlexTouch Pen Inject 34 Units into the skin at bedtime.   lisinopril (ZESTRIL) 20 MG tablet Take 1 tablet (20 mg total) by mouth daily.   meclizine (ANTIVERT) 12.5 MG tablet Take 12.5 mg by mouth 3 (three) times daily as needed for dizziness.   metoprolol succinate (TOPROL-XL) 50 MG 24 hr tablet Take 50 mg by mouth at bedtime. Take with or immediately following a meal.   Misc Natural Products (ZARBEES ALL-IN-ONE PO) Take 3 tablets by mouth daily.   ondansetron (ZOFRAN) 8 MG tablet Take 1 tablet (8 mg total) by mouth every 8 (eight) hours as needed for nausea.   ondansetron (ZOFRAN-ODT) 8 MG disintegrating tablet Take 1 tablet (8 mg total) by mouth every 8 (eight) hours as needed for nausea or vomiting.   oxyCODONE (OXY IR/ROXICODONE) 5 MG immediate release tablet Take 1-2 tablets (5-10 mg total) by mouth every 6 (six) hours as needed for moderate pain, severe pain or breakthrough pain.   promethazine (PHENERGAN) 12.5 MG tablet Take 12.5 mg by mouth every 6 (six) hours as needed. (Patient not taking: Reported on 02/20/2020)   rosuvastatin (CRESTOR) 10 MG tablet Take 10 mg by mouth at bedtime.   tamsulosin (FLOMAX) 0.4 MG CAPS capsule Take 0.4 mg by mouth daily as needed. For kidney stone pain   Vitamin D, Ergocalciferol, (DRISDOL) 1.25 MG (50000 UT) CAPS capsule Take 50,000 Units by mouth every Saturday.   Facility-Administered Encounter Medications as of 11/07/2022  Medication    sodium chloride 0.9 % 1,000 mL with thiamine 100 mg, folic acid 1 mg, multivitamins adult 10 mL infusion   sodium chloride 0.9 % 1,000 mL with thiamine 100 mg, folic acid 1 mg, multivitamins adult 10 mL infusion    ALLERGIES: Allergies  Allergen Reactions   Ciprofloxacin Hives    VACCINATION STATUS:  There is no immunization history on file for this patient.  Diabetes She presents for her follow-up diabetic visit. She has type 2 diabetes mellitus. Onset time: She was diagnosed at approximate age of 51 years.  After Whipple surgery in December 2020, she was put on insulin. Her disease course has been improving. There are no hypoglycemic associated symptoms. Pertinent negatives for hypoglycemia include no confusion, headaches, pallor or seizures. There are  no diabetic associated symptoms. Pertinent negatives for diabetes include no chest pain, no polydipsia, no polyphagia and no polyuria. There are no hypoglycemic complications. Symptoms are worsening. (She does not report gross complications from her diabetes.  However this patient has comorbid conditions including nonalcoholic fatty liver disease, hyperlipidemia, hypertension, obesity class I) Risk factors for coronary artery disease include dyslipidemia, diabetes mellitus, obesity, sedentary lifestyle and post-menopausal. Current diabetic treatment includes insulin injections. Her weight is increasing steadily. She is following a generally unhealthy diet. When asked about meal planning, she reported none. She has not had a previous visit with a dietitian. She participates in exercise intermittently. Her home blood glucose trend is decreasing steadily. Her breakfast blood glucose range is generally 140-180 mg/dl. Her lunch blood glucose range is generally 140-180 mg/dl. Her dinner blood glucose range is generally 140-180 mg/dl. Her bedtime blood glucose range is generally 140-180 mg/dl. Her overall blood glucose range is 140-180 mg/dl. (She  presents with her freestyle libre device showing 51% time in range, 34% level 1 hyperglycemia, 15% level 2 hyperglycemia.    Her average blood glucose is 182 for the last 14 days.  Her point-of-care A1c is 8.3% improving from 9.8%.    She did not call clinic for hyperglycemia.  ) An ACE inhibitor/angiotensin II receptor blocker is being taken.  Hyperlipidemia This is a chronic problem. The current episode started more than 1 year ago. The problem is uncontrolled. Exacerbating diseases include diabetes and obesity. Pertinent negatives include no chest pain, myalgias or shortness of breath. Current antihyperlipidemic treatment includes statins. Risk factors for coronary artery disease include dyslipidemia, diabetes mellitus, hypertension, a sedentary lifestyle and post-menopausal.  Hypertension This is a chronic problem. The current episode started more than 1 year ago. Pertinent negatives include no chest pain, headaches, palpitations or shortness of breath. Risk factors for coronary artery disease include dyslipidemia, diabetes mellitus, obesity, sedentary lifestyle and post-menopausal state. Past treatments include ACE inhibitors.     Review of Systems  Constitutional:  Negative for chills, fever and unexpected weight change.  HENT:  Negative for trouble swallowing and voice change.   Eyes:  Negative for visual disturbance.  Respiratory:  Negative for cough, shortness of breath and wheezing.   Cardiovascular:  Negative for chest pain, palpitations and leg swelling.  Gastrointestinal:  Negative for diarrhea, nausea and vomiting.  Endocrine: Negative for cold intolerance, heat intolerance, polydipsia, polyphagia and polyuria.  Musculoskeletal:  Negative for arthralgias and myalgias.  Skin:  Negative for color change, pallor, rash and wound.  Neurological:  Negative for seizures and headaches.       Right foot drop as of the time of her Whipple surgery.  Psychiatric/Behavioral:  Negative for  confusion and suicidal ideas.     Objective:       11/07/2022    9:39 AM 09/19/2022   11:00 AM 09/19/2022   10:50 AM  Vitals with BMI  Height 5\' 4"     Weight 186 lbs 10 oz    BMI 32.01    Systolic 118 113 161  Diastolic 72 62 64  Pulse 80 73 77    BP 118/72   Pulse 80   Ht 5\' 4"  (1.626 m)   Wt 186 lb 9.6 oz (84.6 kg)   LMP 01/12/2019   BMI 32.03 kg/m   Wt Readings from Last 3 Encounters:  11/07/22 186 lb 9.6 oz (84.6 kg)  09/19/22 186 lb 15.2 oz (84.8 kg)  08/01/22 187 lb (84.8 kg)  CMP ( most recent) CMP     Component Value Date/Time   NA 140 07/25/2022 1009   K 4.2 07/25/2022 1009   CL 105 07/25/2022 1009   CO2 22 07/25/2022 1009   GLUCOSE 128 (H) 07/25/2022 1009   GLUCOSE 76 02/25/2019 0505   BUN 12 07/25/2022 1009   CREATININE 0.68 07/25/2022 1009   CALCIUM 9.1 07/25/2022 1009   PROT 6.6 10/10/2022 1015   PROT 7.0 07/25/2022 1009   ALBUMIN 3.3 (L) 10/10/2022 1015   ALBUMIN 3.7 (L) 07/25/2022 1009   AST 18 10/10/2022 1015   ALT 22 10/10/2022 1015   ALKPHOS 131 (H) 10/10/2022 1015   BILITOT 0.3 10/10/2022 1015   BILITOT 0.3 07/25/2022 1009   GFRNONAA >60 02/25/2019 0505   GFRAA >60 02/25/2019 0505     Diabetic Labs (most recent): Lab Results  Component Value Date   HGBA1C 9.8 (A) 08/01/2022   HGBA1C 7.4 (A) 02/07/2022   HGBA1C 7.2 (A) 11/01/2021     Lipid Panel ( most recent) Lipid Panel     Component Value Date/Time   CHOL 118 01/28/2022 1010   TRIG 95 01/28/2022 1010   HDL 54 01/28/2022 1010   CHOLHDL 2.2 01/28/2022 1010   LDLCALC 46 01/28/2022 1010   LABVLDL 18 01/28/2022 1010    Thyroid ultrasound on April 04, 2022 IMPRESSION: 1. Overall, similar appearance of the enlarged, heterogeneous and multinodular thyroid gland. 2. Nodules # 4, # 7 and # 8 all demonstrate 1 year of stability but remain consistent with TI-RADS category 4 nodules which warrant continued imaging surveillance. Recommend follow-up ultrasound in 1 year. 3.  No new nodules or suspicious features.  Assessment & Plan:   1. Type 2 diabetes mellitus with hyperglycemia, with long-term current use of insulin (HCC)   - Bev Lawter has currently uncontrolled symptomatic type 2 DM since  57 years of age.  She presents with her freestyle libre device showing 51% time in range, 34% level 1 hyperglycemia, 15% level 2 hyperglycemia.   Her average blood glucose is 182 for the last 14 days.  Her point-of-care A1c is 8.3% improving from 9.8%.    She did not call clinic for hyperglycemia.    Recent labs reviewed. - I had a long discussion with her about the progressive nature of diabetes and the pathology behind its complications. -her diabetes is complicated by nonalcoholic fatty liver disease, obesity/sedentary life, comorbid hyperlipidemia, hypertension, history of pancreatic cancer status post pancreatic resection in December 2020 and she remains at a high risk for more acute and chronic complications which include CAD, CVA, CKD, retinopathy, and neuropathy. These are all discussed in detail with her.  - I discussed all available options of managing her diabetes including de-escalation of medications. I have counseled her on diet  and weight management  by adopting a Whole Food , Plant Predominant  ( WFPP) nutrition as recommended by [employer] of Lifestyle Medicine. Patient is encouraged to switch to  unprocessed or minimally processed  complex starch, adequate protein intake (mainly plant source), minimal liquid fat ( mainly vegetable oils), plenty of fruits, and vegetables. -  she is advised to stick to a routine mealtimes to eat 3 complete meals a day and snack only when necessary ( to snack only to correct hypoglycemia BG <70 day time or <100 at night).  In light of her significant metabolic dysfunction including nonalcoholic fatty liver disease, hyperlipidemia, hypertension, obesity, she remains a good candidate for lifestyle meds.  - she  acknowledges that there is a room for improvement in her food and drink choices. - Suggestion is made for her to avoid simple carbohydrates  from her diet including Cakes, Sweet Desserts, Ice Cream, Soda (diet and regular), Sweet Tea, Candies, Chips, Cookies, Store Bought Juices, Alcohol , Artificial Sweeteners,  Coffee Creamer, and "Sugar-free" Products, Lemonade. This will help patient to have more stable blood glucose profile and potentially avoid unintended weight gain.  The following Lifestyle Medicine recommendations according to American College of Lifestyle Medicine  [facility]) were discussed and and offered to patient and she  agrees to start the journey:  A. Whole Foods, Plant-Based Nutrition comprising of fruits and vegetables, plant-based proteins, whole-grain carbohydrates was discussed in detail with the patient.   A list for source of those nutrients were also provided to the patient.  Patient will use only water or unsweetened tea for hydration. B.  The need to stay away from risky substances including alcohol, smoking; obtaining 7 to 9 hours of restorative sleep, at least 150 minutes of moderate intensity exercise weekly, the importance of healthy social connections,  and stress management techniques were discussed. C.  A full color page of  Calorie density of various food groups per pound showing examples of each food groups was provided to the patient.   - she has been scheduled with Norm Salt, RDN, CDE for individualized diabetes education.  - I have approached her with the following plan to manage  her diabetes and patient agrees:   -Based on her presentation with improving glycemic profile.  Due to her tightening glycemic profile overnight, she is advised to lower her Tresiba to 30 units nightly.  At this point, she may benefit from a cautious introduction of a low-dose GLP-1 receptor agonists.  This is due to the fact that she does have prior history of pancreatic CA.   This  will help her avoid multiple daily injections of insulin.  I discussed initiated Ozempic 0.25 mg subcutaneously weekly.    - she is encouraged to call clinic for blood glucose levels less than 70 or above 200 mg /dl weekly average.     - Specific targets for  A1c;  LDL, HDL,  and Triglycerides were discussed with the patient.  2) Blood Pressure /Hypertension:  -Her blood pressure is controlled to target.   she is advised to continue her current medications including lisinopril 20 mg p.o. daily, metoprolol 50 mg p.o. daily with breakfast .  3) Lipids/Hyperlipidemia:   Her recent lipid panel showed controlled LDL at 46.  She is advised to continue Crestor 10 mg p.o. nightly.       Whole food plant-based diet described above is highly recommended for her to achieve control of her lipid panel in light of her medical history of nonalcoholic fatty liver disease (metabolic dysfunction associated steatotic liver disease).   4)  Weight/Diet:  Body mass index is 32.03 kg/m.  -   clearly complicating her diabetes care.   she is  a candidate for weight loss. I discussed with her the fact that loss of 5 - 10% of her  current body weight will have the most impact on her diabetes management.  The above detailed  ACLM recommendations for nutrition, exercise, sleep, social life, avoidance of risky substances, the need for restorative sleep   information will also detailed on discharge instructions.   5) multi-nodular goiter: I reviewed her recent thyroid ultrasound which showed 1.1 cm right inferior thyroid nodule, and 1.1 cm  left inferior thyroid nodule.  Her March 2024 surveillance thyroid ultrasound showed similar findings.  She will need 1 more imaging to document stability over 5 years.  This will be due in March 2025.   6) Chronic Care/Health Maintenance:  -she  is on ACEI/ARB and Statin medications and  is encouraged to initiate and continue to follow up with Ophthalmology, Dentist,  Podiatrist at  least yearly or according to recommendations, and advised to   stay away from smoking. I have recommended yearly flu vaccine and pneumonia vaccine at least every 5 years; moderate intensity exercise for up to 150 minutes weekly; and  sleep for 7- 9 hours a day.  - she is  advised to maintain close follow up with Melvyn Neth, NP for primary care needs, as well as her other providers for optimal and coordinated care.  I spent  26  minutes in the care of the patient today including review of labs from CMP, Lipids, Thyroid Function, Hematology (current and previous including abstractions from other facilities); face-to-face time discussing  her blood glucose readings/logs, discussing hypoglycemia and hyperglycemia episodes and symptoms, medications doses, her options of short and long term treatment based on the latest standards of care / guidelines;  discussion about incorporating lifestyle medicine;  and documenting the encounter. Risk reduction counseling performed per USPSTF guidelines to reduce  obesity and cardiovascular risk factors.     Please refer to Patient Instructions for Blood Glucose Monitoring and Insulin/Medications Dosing Guide"  in media tab for additional information. Please  also refer to " Patient Self Inventory" in the Media  tab for reviewed elements of pertinent patient history.  Leah Farmer participated in the discussions, expressed understanding, and voiced agreement with the above plans.  All questions were answered to her satisfaction. she is encouraged to contact clinic should she have any questions or concerns prior to her return visit.   Follow up plan: - No follow-ups on file.  Marquis Lunch, MD [facility] [address] [place], [place] [address] Phone: [phone]  Fax: [phone]    11/07/2022, 9:48 AM  This note was partially dictated with voice recognition software. Similar sounding words can be  transcribed inadequately or may not  be corrected upon review.

## 2022-12-07 ENCOUNTER — Telehealth: Payer: Self-pay

## 2022-12-07 ENCOUNTER — Telehealth: Payer: Self-pay | Admitting: "Endocrinology

## 2022-12-07 NOTE — Telephone Encounter (Signed)
Pt said she left you a VM on Monday, is asking for a call back

## 2022-12-07 NOTE — Telephone Encounter (Signed)
Pt called stating she has not started Ozempic. States she "feel uneasy" about starting it due to the warning regarding thyroid cancer and her history of thyroid issues. Please advise.

## 2022-12-07 NOTE — Telephone Encounter (Signed)
I have sent Dr.Nida a message and waiting for response.

## 2022-12-09 NOTE — Telephone Encounter (Signed)
Please Advise pt has called back again

## 2022-12-12 NOTE — Telephone Encounter (Signed)
Spoke with pt advising her she does not have to take the Ozempic and to continue using her insulin, call office back if her weekly average glucose is above 200 per Dr.Nida. Pt voiced understanding.

## 2023-01-09 ENCOUNTER — Telehealth: Payer: Self-pay

## 2023-01-09 DIAGNOSIS — C250 Malignant neoplasm of head of pancreas: Secondary | ICD-10-CM

## 2023-01-09 NOTE — Telephone Encounter (Signed)
Lab order entered °Left message on machine to call back  °

## 2023-01-09 NOTE — Telephone Encounter (Signed)
-----   Message from Nurse Brianna Bennett P sent at 10/10/2022  4:24 PM EDT ----- Pt to get labs 2-3 months

## 2023-01-09 NOTE — Telephone Encounter (Signed)
recall for [facility] in 2 to 3 months.

## 2023-01-10 NOTE — Telephone Encounter (Signed)
Left message on machine to call back  

## 2023-01-10 NOTE — Telephone Encounter (Signed)
Sent a My Chart message to the pt to come in for labs as she is able.  Order entered.

## 2023-01-10 NOTE — Telephone Encounter (Signed)
Inbound call from patient returning phone call. Requesting a call back. Please advise, thank you.  

## 2023-03-13 ENCOUNTER — Ambulatory Visit: Payer: Medicare Other | Admitting: "Endocrinology

## 2023-03-15 ENCOUNTER — Other Ambulatory Visit: Payer: Self-pay | Admitting: General Surgery

## 2023-03-15 DIAGNOSIS — E042 Nontoxic multinodular goiter: Secondary | ICD-10-CM

## 2023-03-15 DIAGNOSIS — E041 Nontoxic single thyroid nodule: Secondary | ICD-10-CM

## 2023-03-21 ENCOUNTER — Encounter: Payer: Self-pay | Admitting: Gastroenterology

## 2023-04-12 ENCOUNTER — Other Ambulatory Visit (HOSPITAL_COMMUNITY): Payer: Self-pay | Admitting: General Surgery

## 2023-04-12 DIAGNOSIS — K760 Fatty (change of) liver, not elsewhere classified: Secondary | ICD-10-CM

## 2023-04-12 DIAGNOSIS — E1165 Type 2 diabetes mellitus with hyperglycemia: Secondary | ICD-10-CM

## 2023-04-12 DIAGNOSIS — C7A8 Other malignant neuroendocrine tumors: Secondary | ICD-10-CM

## 2023-04-12 DIAGNOSIS — E611 Iron deficiency: Secondary | ICD-10-CM

## 2023-04-12 DIAGNOSIS — Z86711 Personal history of pulmonary embolism: Secondary | ICD-10-CM

## 2023-04-12 DIAGNOSIS — E041 Nontoxic single thyroid nodule: Secondary | ICD-10-CM

## 2023-04-14 ENCOUNTER — Encounter: Payer: Self-pay | Admitting: General Surgery

## 2023-04-18 ENCOUNTER — Other Ambulatory Visit

## 2023-04-22 LAB — COMPREHENSIVE METABOLIC PANEL
ALT: 30 IU/L (ref 0–32)
AST: 31 IU/L (ref 0–40)
Albumin: 3.8 g/dL (ref 3.8–4.9)
Alkaline Phosphatase: 184 IU/L — ABNORMAL HIGH (ref 44–121)
BUN/Creatinine Ratio: 22 (ref 9–23)
BUN: 15 mg/dL (ref 6–24)
Bilirubin Total: 0.4 mg/dL (ref 0.0–1.2)
CO2: 19 mmol/L — ABNORMAL LOW (ref 20–29)
Calcium: 9.1 mg/dL (ref 8.7–10.2)
Chloride: 106 mmol/L (ref 96–106)
Creatinine, Ser: 0.69 mg/dL (ref 0.57–1.00)
Globulin, Total: 3.3 g/dL (ref 1.5–4.5)
Glucose: 134 mg/dL — ABNORMAL HIGH (ref 70–99)
Potassium: 5.1 mmol/L (ref 3.5–5.2)
Sodium: 140 mmol/L (ref 134–144)
Total Protein: 7.1 g/dL (ref 6.0–8.5)
eGFR: 102 mL/min/{1.73_m2} (ref >59)

## 2023-04-22 LAB — TSH: TSH: 0.44 u[IU]/mL — ABNORMAL LOW (ref 0.450–4.500)

## 2023-04-22 LAB — LIPID PANEL
Chol/HDL Ratio: 1.9 ratio (ref 0.0–4.4)
Cholesterol, Total: 102 mg/dL (ref 100–199)
HDL: 55 mg/dL (ref >39)
LDL Chol Calc (NIH): 34 mg/dL (ref 0–99)
Triglycerides: 59 mg/dL (ref 0–149)
VLDL Cholesterol Cal: 13 mg/dL (ref 5–40)

## 2023-04-22 LAB — T4, FREE: Free T4: 1.42 ng/dL (ref 0.82–1.77)

## 2023-04-25 ENCOUNTER — Ambulatory Visit
Admission: RE | Admit: 2023-04-25 | Discharge: 2023-04-25 | Disposition: A | Discharge status: Home / Self Care | Source: Ambulatory Visit | Attending: General Surgery | Admitting: General Surgery

## 2023-04-25 DIAGNOSIS — E042 Nontoxic multinodular goiter: Secondary | ICD-10-CM

## 2023-04-25 DIAGNOSIS — E041 Nontoxic single thyroid nodule: Secondary | ICD-10-CM

## 2023-05-01 ENCOUNTER — Ambulatory Visit: Payer: Medicare Other | Admitting: "Endocrinology

## 2023-05-01 ENCOUNTER — Encounter: Payer: Self-pay | Admitting: "Endocrinology

## 2023-05-01 ENCOUNTER — Telehealth: Payer: Self-pay | Admitting: Gastroenterology

## 2023-05-01 VITALS — BP 110/66 | HR 76 | Ht 64.0 in | Wt 193.6 lb

## 2023-05-01 DIAGNOSIS — E1165 Type 2 diabetes mellitus with hyperglycemia: Secondary | ICD-10-CM

## 2023-05-01 DIAGNOSIS — E782 Mixed hyperlipidemia: Secondary | ICD-10-CM | POA: Diagnosis not present

## 2023-05-01 DIAGNOSIS — Z794 Long term (current) use of insulin: Secondary | ICD-10-CM

## 2023-05-01 DIAGNOSIS — I10 Essential (primary) hypertension: Secondary | ICD-10-CM

## 2023-05-01 LAB — POCT GLYCOSYLATED HEMOGLOBIN (HGB A1C): HbA1c, POC (controlled diabetic range): 9.4 % — AB (ref 0.0–7.0)

## 2023-05-01 MED ORDER — FREESTYLE LIBRE 3 PLUS SENSOR MISC: 2 refills | Status: DC

## 2023-05-01 MED ORDER — FREESTYLE LIBRE 3 READER DEVI: 1.0000 | Freq: Once | 0 refills | Status: AC | PRN

## 2023-05-01 MED ORDER — INSULIN LISPRO PROT & LISPRO (75-25 MIX) 100 UNIT/ML KWIKPEN: 20.0000 [IU] | PEN_INJECTOR | Freq: Two times a day (BID) | SUBCUTANEOUS | 2 refills | Status: DC

## 2023-05-01 NOTE — Telephone Encounter (Signed)
 PT wants to know if the lab work that she had with Dr. Fransico Him can be read by Dr. Meridee Score instead of her actually having the labs done. Please advise.

## 2023-05-01 NOTE — Telephone Encounter (Signed)
 The pt has been advised that the labs she had done at Dr Isidoro Donning office  is not the same that Dr Meridee Score is needing.  She will get a fax number to a local [facility] in [place] so that I can fax the order to them if she can not get here in a few weeks.

## 2023-05-01 NOTE — Progress Notes (Signed)
 05/01/2023, 5:27 PM   Subjective:    Patient ID: Leah Farmer, female    DOB: [birth year].  Leah Farmer is being seen in follow-up after she was seen in consultation for management of currently uncontrolled symptomatic diabetes requested by  Melvyn Neth, NP.   Past Medical History:  Diagnosis Date   Anxiety    Arthritis 2020   lower back and hip; takes only OTC Motrin   Depression    mild and situational; not medicated   Diabetes mellitus without complication (HCC)    unsure if type 1 or 2 , dx as an adult    GERD (gastroesophageal reflux disease)    History of kidney stones 07/2018   hospitalized at [facility]; passed on her own   Hypertension    NAFL (nonalcoholic fatty liver) 07/08/2018   fatty liver seen on CT scan abd/ pelvis   Nephrolithiasis    lov in june 2020 , also had covid at this time    pancreatic ca dx'd 08/2018   pancreatic   Pneumonia    PONV (postoperative nausea and vomiting)    Tachycardia    generally runs in the 120s    Past Surgical History:  Procedure Laterality Date   APPENDECTOMY  in 20s   BIOPSY  09/19/2022   Procedure: BIOPSY;  Surgeon: Lemar Lofty., MD;  Location: [facility];  Service: Gastroenterology;;   CHOLECYSTECTOMY  in 20s   ENDOSCOPIC RETROGRADE CHOLANGIOPANCREATOGRAPHY (ERCP) WITH PROPOFOL N/A 09/19/2022   Procedure: ENDOSCOPIC RETROGRADE CHOLANGIOPANCREATOGRAPHY (ERCP) WITH PROPOFOL;  Surgeon: Lemar Lofty., MD;  Location: [facility];  Service: Gastroenterology;  Laterality: N/A;   ESOPHAGOGASTRODUODENOSCOPY (EGD) WITH PROPOFOL N/A 11/01/2018   Procedure: ESOPHAGOGASTRODUODENOSCOPY (EGD) WITH PROPOFOL;  Surgeon: Rachael Fee, MD;  Location: [facility];  Service: Endoscopy;  Laterality: N/A;   EUS N/A 11/01/2018   Procedure: UPPER ENDOSCOPIC ULTRASOUND (EUS) LINEAR;  Surgeon: Rachael Fee, MD;  Location: [facility];  Service: Endoscopy;  Laterality: N/A;   FINE NEEDLE  ASPIRATION N/A 11/01/2018   Procedure: FINE NEEDLE ASPIRATION (FNA) LINEAR;  Surgeon: Rachael Fee, MD;  Location: [facility];  Service: Endoscopy;  Laterality: N/A;   IR CATHETER TUBE CHANGE  02/16/2019   IR RADIOLOGIST EVAL & MGMT  03/14/2019   IR THORACENTESIS ASP PLEURAL SPACE W/IMG GUIDE  01/21/2019   LAPAROSCOPY N/A 01/15/2019   Procedure: LAPAROSCOPY DIAGNOSTIC;  Surgeon: Almond Lint, MD;  Location: [facility];  Service: General;  Laterality: N/A;   STENT REMOVAL  09/19/2022   Procedure: STENT REMOVAL;  Surgeon: Lemar Lofty., MD;  Location: [facility];  Service: Gastroenterology;;  pancreatic stent   SUBMUCOSAL TATTOO INJECTION  09/19/2022   Procedure: SUBMUCOSAL TATTOO INJECTION;  Surgeon: Lemar Lofty., MD;  Location: [facility];  Service: Gastroenterology;;   [facility] PROCEDURE N/A 01/15/2019   Procedure: WHIPPLE PROCEDURE;  Surgeon: Almond Lint, MD;  Location: [facility];  Service: General;  Laterality: N/A;    Social History   Socioeconomic History   Marital status: Married    Spouse name: Not on file   Number of children: Not on file   Years of education: Not on file   Highest education level: Not on file  Occupational History   Not on file  Tobacco Use   Smoking status: Never   Smokeless tobacco: Never  Vaping Use   Vaping status: Never Used  Substance and Sexual Activity   Alcohol use: Never   Drug use: Never  Sexual activity: Not Currently  Other Topics Concern   Not on file  Social History Narrative   Not on file   Social Drivers of Health   Financial Resource Strain: Not on file  Food Insecurity: Not on file  Transportation Needs: Not on file  Physical Activity: Not on file  Stress: Not on file  Social Connections: Not on file    Family History  Problem Relation Age of Onset   Hypertension Mother    Diabetes Mother    Pancreatic cancer Mother    Hypertension Father    Diabetes Father    Esophageal cancer Maternal Uncle         Passed at 40. Found colon cancer on colonoscopy at that time.    Colon cancer Neg Hx    Colon polyps Neg Hx     Outpatient Encounter Medications as of 05/01/2023  Medication Sig   Continuous Glucose Receiver (FREESTYLE LIBRE 3 READER) DEVI 1 Piece by Does not apply route once as needed for up to 1 dose.   Continuous Glucose Sensor (FREESTYLE LIBRE 3 PLUS SENSOR) MISC Change sensor every 15 days.   Insulin Lispro Prot & Lispro (HUMALOG MIX 75/25 KWIKPEN) (75-25) 100 UNIT/ML Kwikpen Inject 20 Units into the skin 2 (two) times daily before a meal.   clonazePAM (KLONOPIN) 0.5 MG tablet Take 0.25-0.5 mg by mouth daily as needed for anxiety.   lisinopril (ZESTRIL) 20 MG tablet Take 1 tablet (20 mg total) by mouth daily.   meclizine (ANTIVERT) 12.5 MG tablet Take 12.5 mg by mouth 3 (three) times daily as needed for dizziness.   metoprolol succinate (TOPROL-XL) 50 MG 24 hr tablet Take 50 mg by mouth at bedtime. Take with or immediately following a meal.   Misc Natural Products (ZARBEES ALL-IN-ONE PO) Take 3 tablets by mouth daily.   ondansetron (ZOFRAN) 8 MG tablet Take 1 tablet (8 mg total) by mouth every 8 (eight) hours as needed for nausea.   ondansetron (ZOFRAN-ODT) 8 MG disintegrating tablet Take 1 tablet (8 mg total) by mouth every 8 (eight) hours as needed for nausea or vomiting.   oxyCODONE (OXY IR/ROXICODONE) 5 MG immediate release tablet Take 1-2 tablets (5-10 mg total) by mouth every 6 (six) hours as needed for moderate pain, severe pain or breakthrough pain.   promethazine (PHENERGAN) 12.5 MG tablet Take 12.5 mg by mouth every 6 (six) hours as needed. (Patient not taking: Reported on 02/20/2020)   rosuvastatin (CRESTOR) 10 MG tablet Take 10 mg by mouth at bedtime.   tamsulosin (FLOMAX) 0.4 MG CAPS capsule Take 0.4 mg by mouth daily as needed. For kidney stone pain   Vitamin D, Ergocalciferol, (DRISDOL) 1.25 MG (50000 UT) CAPS capsule Take 50,000 Units by mouth every Saturday.    [DISCONTINUED] Continuous Blood Gluc Receiver (FREESTYLE LIBRE 2 READER) DEVI As directed   [DISCONTINUED] Continuous Blood Gluc Sensor (FREESTYLE LIBRE 2 SENSOR) MISC 1 Piece by Does not apply route every 14 (fourteen) days.   [DISCONTINUED] insulin degludec (TRESIBA FLEXTOUCH) 100 UNIT/ML FlexTouch Pen Inject 30 Units into the skin at bedtime. (Patient taking differently: Inject 34 Units into the skin at bedtime.)   [DISCONTINUED] Semaglutide,0.25 or 0.5MG /DOS, (OZEMPIC, 0.25 OR 0.5 MG/DOSE,) 2 MG/3ML SOPN Inject 0.25 mg into the skin once a week. (Patient not taking: Reported on 05/01/2023)   Facility-Administered Encounter Medications as of 05/01/2023  Medication   sodium chloride 0.9 % 1,000 mL with thiamine 100 mg, folic acid 1 mg, multivitamins adult 10 mL infusion  sodium chloride 0.9 % 1,000 mL with thiamine 100 mg, folic acid 1 mg, multivitamins adult 10 mL infusion    ALLERGIES: Allergies  Allergen Reactions   Ciprofloxacin Hives    VACCINATION STATUS:  There is no immunization history on file for this patient.  Diabetes She presents for her follow-up diabetic visit. She has type 2 diabetes mellitus. Onset time: She was diagnosed at approximate age of 51 years.  After Whipple surgery in December 2020, she was put on insulin. Her disease course has been worsening. There are no hypoglycemic associated symptoms. Pertinent negatives for hypoglycemia include no confusion, headaches, pallor or seizures. There are no diabetic associated symptoms. Pertinent negatives for diabetes include no chest pain, no polydipsia, no polyphagia and no polyuria. There are no hypoglycemic complications. Symptoms are worsening. (She does not report gross complications from her diabetes.  However this patient has comorbid conditions including nonalcoholic fatty liver disease, hyperlipidemia, hypertension, obesity class I) Risk factors for coronary artery disease include dyslipidemia, diabetes mellitus,  obesity, sedentary lifestyle and post-menopausal. Current diabetic treatment includes insulin injections. Her weight is increasing steadily. She is following a generally unhealthy diet. When asked about meal planning, she reported none. She has not had a previous visit with a dietitian. Her home blood glucose trend is increasing steadily. Her breakfast blood glucose range is generally 180-200 mg/dl. Her lunch blood glucose range is generally 180-200 mg/dl. Her dinner blood glucose range is generally 180-200 mg/dl. Her bedtime blood glucose range is generally 180-200 mg/dl. Her overall blood glucose range is 180-200 mg/dl. (She presents with her freestyle libre device showing only partial utility with 55 % TIR, 32 % L1HI, 13 L2HI, no hypoglycemia. Her POC A1c is higher at 9.4% increasing from  8.3%.   34% level 1 hyperglycemia, 15% level 2 hyperglycemia.      She did not call clinic for hyperglycemia.  She did not start Ozempic for fear of side effects.) An ACE inhibitor/angiotensin II receptor blocker is being taken.  Hyperlipidemia This is a chronic problem. The current episode started more than 1 year ago. The problem is uncontrolled. Exacerbating diseases include diabetes and obesity. Pertinent negatives include no chest pain, myalgias or shortness of breath. Current antihyperlipidemic treatment includes statins. Risk factors for coronary artery disease include dyslipidemia, diabetes mellitus, hypertension, a sedentary lifestyle and post-menopausal.  Hypertension This is a chronic problem. The current episode started more than 1 year ago. Pertinent negatives include no chest pain, headaches, palpitations or shortness of breath. Risk factors for coronary artery disease include dyslipidemia, diabetes mellitus, obesity, sedentary lifestyle and post-menopausal state. Past treatments include ACE inhibitors.     Review of Systems  Constitutional:  Negative for chills, fever and unexpected weight change.   HENT:  Negative for trouble swallowing and voice change.   Eyes:  Negative for visual disturbance.  Respiratory:  Negative for cough, shortness of breath and wheezing.   Cardiovascular:  Negative for chest pain, palpitations and leg swelling.  Gastrointestinal:  Negative for diarrhea, nausea and vomiting.  Endocrine: Negative for cold intolerance, heat intolerance, polydipsia, polyphagia and polyuria.  Musculoskeletal:  Negative for arthralgias and myalgias.  Skin:  Negative for color change, pallor, rash and wound.  Neurological:  Negative for seizures and headaches.       Right foot drop as of the time of her Whipple surgery.  Psychiatric/Behavioral:  Negative for confusion and suicidal ideas.     Objective:       05/01/2023   11:20 AM 11/07/2022  9:39 AM 09/19/2022   11:00 AM  Vitals with BMI  Height 5\' 4"  5\' 4"    Weight 193 lbs 10 oz 186 lbs 10 oz   BMI 33.21 32.01   Systolic 110 118 409  Diastolic 66 72 62  Pulse 76 80 73    BP 110/66   Pulse 76   Ht 5\' 4"  (1.626 m)   Wt 193 lb 9.6 oz (87.8 kg)   LMP 01/12/2019   BMI 33.23 kg/m   Wt Readings from Last 3 Encounters:  05/01/23 193 lb 9.6 oz (87.8 kg)  11/07/22 186 lb 9.6 oz (84.6 kg)  09/19/22 186 lb 15.2 oz (84.8 kg)      CMP ( most recent) CMP     Component Value Date/Time   NA 140 04/21/2023 1005   K 5.1 04/21/2023 1005   CL 106 04/21/2023 1005   CO2 19 (L) 04/21/2023 1005   GLUCOSE 134 (H) 04/21/2023 1005   GLUCOSE 76 02/25/2019 0505   BUN 15 04/21/2023 1005   CREATININE 0.69 04/21/2023 1005   CALCIUM 9.1 04/21/2023 1005   PROT 7.1 04/21/2023 1005   ALBUMIN 3.8 04/21/2023 1005   AST 31 04/21/2023 1005   ALT 30 04/21/2023 1005   ALKPHOS 184 (H) 04/21/2023 1005   BILITOT 0.4 04/21/2023 1005   GFRNONAA >60 02/25/2019 0505   GFRAA >60 02/25/2019 0505     Diabetic Labs (most recent): Lab Results  Component Value Date   HGBA1C 9.4 (A) 05/01/2023   HGBA1C 8.3 (A) 11/07/2022   HGBA1C 9.8 (A)  08/01/2022     Lipid Panel ( most recent) Lipid Panel     Component Value Date/Time   CHOL 102 04/21/2023 1005   TRIG 59 04/21/2023 1005   HDL 55 04/21/2023 1005   CHOLHDL 1.9 04/21/2023 1005   LDLCALC 34 04/21/2023 1005   LABVLDL 13 04/21/2023 1005    Thyroid ultrasound on April 04, 2022 IMPRESSION: 1. Overall, similar appearance of the enlarged, heterogeneous and multinodular thyroid gland. 2. Nodules # 4, # 7 and # 8 all demonstrate 1 year of stability but remain consistent with TI-RADS category 4 nodules which warrant continued imaging surveillance. Recommend follow-up ultrasound in 1  year. 3. No new nodules or suspicious features.  Repeat Thyroid Ultrasound on 04/25/2023 IMPRESSION: 1. Similar benign appearing multinodular goiter. 2. The previously visualized left inferior (labeled 7, 1.1 cm, previously labeled 8, 1.1 cm) and right inferior (labeled 2, 1.0 cm, previously labeled 5, 1.0 cm) now demonstrate benign features and do  not require additional follow-up.   Assessment & Plan:   1. Type 2 diabetes mellitus with hyperglycemia, with long-term current use of insulin (HCC)   - Leah Farmer has currently uncontrolled symptomatic type 2 DM since  58 years of age.  She presents with her freestyle libre device showing only partial utility with 55 % TIR, 32 % L1HI, 13 L2HI, no hypoglycemia. Her POC A1c is higher at 9.4% increasing from  8.3%.   34% level 1 hyperglycemia, 15% level 2 hyperglycemia.      She did not call clinic for hyperglycemia.  She did not start Ozempic for fear of side effects.  Recent labs reviewed. - I had a long discussion with her about the progressive nature of diabetes and the pathology behind its complications. -her diabetes is complicated by nonalcoholic fatty liver disease, obesity/sedentary life, comorbid hyperlipidemia, hypertension, history of pancreatic cancer status post pancreatic resection in December 2020 and she remains at a  high risk for more acute and chronic complications which include CAD, CVA, CKD, retinopathy, and neuropathy. These are all discussed in detail with her.  - I discussed all available options of managing her diabetes including de-escalation of medications. I have counseled her on diet  and weight management  by adopting a Whole Food , Plant Predominant  ( WFPP) nutrition as recommended by [employer] of Lifestyle Medicine. Patient is encouraged to switch to  unprocessed or minimally processed  complex starch, adequate protein intake (mainly plant source), minimal liquid fat ( mainly vegetable oils), plenty of fruits, and vegetables. -  she is advised to stick to a routine mealtimes to eat 3 complete meals a day and snack only when necessary ( to snack only to correct hypoglycemia BG <70 day time or <100 at night).  In light of her significant metabolic dysfunction including nonalcoholic fatty liver disease, hyperlipidemia, hypertension, obesity, she remains a good candidate for lifestyle meds.  She did not engage with Lifestyle Medicine nutrition optimally. - she acknowledges that there is a room for improvement in her food and drink choices. - Suggestion is made for her to avoid simple carbohydrates  from her diet including Cakes, Sweet Desserts, Ice Cream, Soda (diet and regular), Sweet Tea, Candies, Chips, Cookies, Store Bought Juices, Alcohol , Artificial Sweeteners,  Coffee Creamer, and "Sugar-free" Products, Lemonade. This will help patient to have more stable blood glucose profile and potentially avoid unintended weight gain.  - I have approached her with the following plan to manage  her diabetes and patient agrees:   -Based on her presentation with worsening  glycemic profile and her hesitance to MDI, she is approached for premixed insulin to use 2 times daily and she agrees.  I discussed and prescribed Humalog 75/25 20 units with breakfast and 20 units with supper when glucose readings are  above 90 and when she is eating.  She would like to avoid GLP-1R agonists. She will be given a rx for freestyle Libre 3  and advised to use it consistently.  - she is encouraged to call clinic for blood glucose levels less than 70 or above 200 mg /dl weekly average.     - Specific targets for  A1c;  LDL, HDL,  and Triglycerides were discussed with the patient.  2) Blood Pressure /Hypertension:  Her BP is controled to target.   she is advised to continue her current medications including lisinopril 20 mg p.o. daily, metoprolol 50 mg p.o. daily with breakfast .  3) Lipids/Hyperlipidemia:   Her recent lipid panel showed controlled LDL at 34.    She is advised to continue Crestor 10 mg p.o. nightly.       Whole food plant-based diet described above is highly recommended for her to achieve control of her lipid panel in light of her medical history of nonalcoholic fatty liver disease (metabolic dysfunction associated steatotic liver disease).   4)  Weight/Diet:  Body mass index is 33.23 kg/m.  -   clearly complicating her diabetes care.   she is  a candidate for weight loss. I discussed with her the fact that loss of 5 - 10% of her  current body weight will have the most impact on her diabetes management.  The above detailed  ACLM recommendations for nutrition, exercise, sleep, social life, avoidance of risky substances, the need for restorative sleep   information will also detailed on discharge instructions.   5) multi-nodular goiter: I reviewed her recent thyroid ultrasound which showed  stable findings, no further imaging or intervention is needed. See above.    6) Chronic Care/Health Maintenance:  -she  is on ACEI/ARB and Statin medications and  is encouraged to initiate and continue to follow up with Ophthalmology, Dentist,  Podiatrist at least yearly or according to recommendations, and advised to   stay away from smoking. I have recommended yearly flu vaccine and pneumonia vaccine at  least every 5 years; moderate intensity exercise for up to 150 minutes weekly; and  sleep for 7- 9 hours a day.  - she is  advised to maintain close follow up with Melvyn Neth, NP for primary care needs, as well as her other providers for optimal and coordinated care.   I spent  26 minutes in the care of the patient today including review of labs from CMP, Lipids, Thyroid Function, Hematology (current and previous including abstractions from other facilities); face-to-face time discussing  her blood glucose readings/logs, discussing hypoglycemia and hyperglycemia episodes and symptoms, medications doses, her options of short and long term treatment based on the latest standards of care / guidelines;  discussion about incorporating lifestyle medicine;  and documenting the encounter. Risk reduction counseling performed per USPSTF guidelines to reduce obesity and cardiovascular risk factors.     Please refer to Patient Instructions for Blood Glucose Monitoring and Insulin/Medications Dosing Guide"  in media tab for additional information. Please  also refer to " Patient Self Inventory" in the Media  tab for reviewed elements of pertinent patient history.  Leah Farmer participated in the discussions, expressed understanding, and voiced agreement with the above plans.  All questions were answered to her satisfaction. she is encouraged to contact clinic should she have any questions or concerns prior to her return visit.   Follow up plan: - Return in about 3 months (around 07/31/2023) for Bring Meter/CGM Device/Logs- A1c in Office.  Marquis Lunch, MD [facility] [address] [place], [place] [address] Phone: [phone]  Fax: [phone]    05/01/2023, 5:27 PM  This note was partially dictated with voice recognition software. Similar sounding words can be transcribed inadequately or may not  be corrected upon review.

## 2023-05-01 NOTE — Patient Instructions (Signed)

## 2023-05-19 ENCOUNTER — Encounter (HOSPITAL_COMMUNITY)

## 2023-05-23 ENCOUNTER — Ambulatory Visit (HOSPITAL_COMMUNITY)
Admission: RE | Admit: 2023-05-23 | Discharge: 2023-05-23 | Disposition: A | Discharge status: Home / Self Care | Source: Ambulatory Visit | Attending: General Surgery | Admitting: General Surgery

## 2023-05-23 DIAGNOSIS — C7A8 Other malignant neuroendocrine tumors: Secondary | ICD-10-CM | POA: Diagnosis present

## 2023-05-23 DIAGNOSIS — Z86711 Personal history of pulmonary embolism: Secondary | ICD-10-CM

## 2023-05-23 DIAGNOSIS — K760 Fatty (change of) liver, not elsewhere classified: Secondary | ICD-10-CM | POA: Diagnosis present

## 2023-05-23 DIAGNOSIS — E611 Iron deficiency: Secondary | ICD-10-CM

## 2023-05-23 DIAGNOSIS — E1165 Type 2 diabetes mellitus with hyperglycemia: Secondary | ICD-10-CM

## 2023-05-23 DIAGNOSIS — Z794 Long term (current) use of insulin: Secondary | ICD-10-CM | POA: Diagnosis present

## 2023-05-23 DIAGNOSIS — E041 Nontoxic single thyroid nodule: Secondary | ICD-10-CM

## 2023-05-23 MED ORDER — COPPER CU 64 DOTATATE 1 MCI/ML IV SOLN
4.0000 | Freq: Once | INTRAVENOUS | Status: AC
  Administered 2023-05-23: 3.9 via INTRAVENOUS

## 2023-05-29 ENCOUNTER — Other Ambulatory Visit (INDEPENDENT_AMBULATORY_CARE_PROVIDER_SITE_OTHER)

## 2023-05-29 ENCOUNTER — Other Ambulatory Visit: Payer: Self-pay

## 2023-05-29 DIAGNOSIS — C250 Malignant neoplasm of head of pancreas: Secondary | ICD-10-CM | POA: Diagnosis not present

## 2023-05-29 LAB — HEPATIC FUNCTION PANEL
ALT: 27 U/L (ref 0–35)
AST: 23 U/L (ref 0–37)
Albumin: 3.6 g/dL (ref 3.5–5.2)
Alkaline Phosphatase: 126 U/L — ABNORMAL HIGH (ref 39–117)
Bilirubin, Direct: 0.1 mg/dL (ref 0.0–0.3)
Total Bilirubin: 0.3 mg/dL (ref 0.2–1.2)
Total Protein: 7 g/dL (ref 6.0–8.3)

## 2023-05-30 ENCOUNTER — Encounter: Payer: Self-pay | Admitting: Gastroenterology

## 2023-07-31 ENCOUNTER — Encounter: Payer: Self-pay | Admitting: "Endocrinology

## 2023-07-31 ENCOUNTER — Ambulatory Visit: Admitting: "Endocrinology

## 2023-07-31 VITALS — BP 110/68 | HR 80 | Ht 64.0 in | Wt 202.2 lb

## 2023-07-31 DIAGNOSIS — I10 Essential (primary) hypertension: Secondary | ICD-10-CM

## 2023-07-31 DIAGNOSIS — Z794 Long term (current) use of insulin: Secondary | ICD-10-CM

## 2023-07-31 DIAGNOSIS — E1165 Type 2 diabetes mellitus with hyperglycemia: Secondary | ICD-10-CM

## 2023-07-31 DIAGNOSIS — E66811 Obesity, class 1: Secondary | ICD-10-CM

## 2023-07-31 DIAGNOSIS — E782 Mixed hyperlipidemia: Secondary | ICD-10-CM

## 2023-07-31 DIAGNOSIS — E6609 Other obesity due to excess calories: Secondary | ICD-10-CM

## 2023-07-31 DIAGNOSIS — Z6834 Body mass index (BMI) 34.0-34.9, adult: Secondary | ICD-10-CM

## 2023-07-31 LAB — POCT GLYCOSYLATED HEMOGLOBIN (HGB A1C): HbA1c, POC (controlled diabetic range): 8.9 % — AB (ref 0.0–7.0)

## 2023-07-31 MED ORDER — INSULIN LISPRO PROT & LISPRO (75-25 MIX) 100 UNIT/ML KWIKPEN: 20.0000 [IU] | PEN_INJECTOR | Freq: Two times a day (BID) | SUBCUTANEOUS | 2 refills | Status: DC

## 2023-07-31 NOTE — Patient Instructions (Signed)

## 2023-07-31 NOTE — Progress Notes (Signed)
 07/31/2023, 4:41 PM   Subjective:    Patient ID: Leah Farmer, female    DOB: 05-17-1965.  Leah Farmer is being seen in follow-up after she was seen in consultation for management of currently uncontrolled symptomatic diabetes requested by  Darcie Lerner, NP.   Past Medical History:  Diagnosis Date   Anxiety    Arthritis 2020   lower back and hip; takes only OTC Motrin   Depression    mild and situational; not medicated   Diabetes mellitus without complication (HCC)    unsure if type 1 or 2 , dx as an adult    GERD (gastroesophageal reflux disease)    History of kidney stones 07/2018   hospitalized at [facility]; passed on her own   Hypertension    NAFL (nonalcoholic fatty liver) 07/08/2018   fatty liver seen on CT scan abd/ pelvis   Nephrolithiasis    lov in june 2020 , also had covid at this time    pancreatic ca dx'd 08/2018   pancreatic   Pneumonia    PONV (postoperative nausea and vomiting)    Tachycardia    generally runs in the 120s    Past Surgical History:  Procedure Laterality Date   APPENDECTOMY  in 20s   BIOPSY  09/19/2022   Procedure: BIOPSY;  Surgeon: Wilhelmenia Aloha Raddle., MD;  Location: [facility];  Service: Gastroenterology;;   CHOLECYSTECTOMY  in 20s   ENDOSCOPIC RETROGRADE CHOLANGIOPANCREATOGRAPHY (ERCP) WITH PROPOFOL  N/A 09/19/2022   Procedure: ENDOSCOPIC RETROGRADE CHOLANGIOPANCREATOGRAPHY (ERCP) WITH PROPOFOL ;  Surgeon: Wilhelmenia Aloha Raddle., MD;  Location: [facility];  Service: Gastroenterology;  Laterality: N/A;   ESOPHAGOGASTRODUODENOSCOPY (EGD) WITH PROPOFOL  N/A 11/01/2018   Procedure: ESOPHAGOGASTRODUODENOSCOPY (EGD) WITH PROPOFOL ;  Surgeon: Teressa Toribio SQUIBB, MD;  Location: [facility];  Service: Endoscopy;  Laterality: N/A;   EUS N/A 11/01/2018   Procedure: UPPER ENDOSCOPIC ULTRASOUND (EUS) LINEAR;  Surgeon: Teressa Toribio SQUIBB, MD;  Location: [facility];  Service: Endoscopy;  Laterality: N/A;   FINE NEEDLE  ASPIRATION N/A 11/01/2018   Procedure: FINE NEEDLE ASPIRATION (FNA) LINEAR;  Surgeon: Teressa Toribio SQUIBB, MD;  Location: [facility];  Service: Endoscopy;  Laterality: N/A;   IR CATHETER TUBE CHANGE  02/16/2019   IR RADIOLOGIST EVAL & MGMT  03/14/2019   IR THORACENTESIS ASP PLEURAL SPACE W/IMG GUIDE  01/21/2019   LAPAROSCOPY N/A 01/15/2019   Procedure: LAPAROSCOPY DIAGNOSTIC;  Surgeon: Aron Shoulders, MD;  Location: [facility];  Service: General;  Laterality: N/A;   STENT REMOVAL  09/19/2022   Procedure: STENT REMOVAL;  Surgeon: Wilhelmenia Aloha Raddle., MD;  Location: [facility];  Service: Gastroenterology;;  pancreatic stent   SUBMUCOSAL TATTOO INJECTION  09/19/2022   Procedure: SUBMUCOSAL TATTOO INJECTION;  Surgeon: Wilhelmenia Aloha Raddle., MD;  Location: [facility];  Service: Gastroenterology;;   [facility] PROCEDURE N/A 01/15/2019   Procedure: WHIPPLE PROCEDURE;  Surgeon: Aron Shoulders, MD;  Location: [facility];  Service: General;  Laterality: N/A;    Social History   Socioeconomic History   Marital status: Married    Spouse name: Not on file   Number of children: Not on file   Years of education: Not on file   Highest education level: Not on file  Occupational History   Not on file  Tobacco Use   Smoking status: Never   Smokeless tobacco: Never  Vaping Use   Vaping status: Never Used  Substance and Sexual Activity   Alcohol use: Never   Drug use: Never  Sexual activity: Not Currently  Other Topics Concern   Not on file  Social History Narrative   Not on file   Social Drivers of Health   Financial Resource Strain: Not on file  Food Insecurity: Not on file  Transportation Needs: Not on file  Physical Activity: Not on file  Stress: Not on file  Social Connections: Not on file    Family History  Problem Relation Age of Onset   Hypertension Mother    Diabetes Mother    Pancreatic cancer Mother    Hypertension Father    Diabetes Father    Esophageal cancer Maternal Uncle         Passed at 69. Found colon cancer on colonoscopy at that time.    Colon cancer Neg Hx    Colon polyps Neg Hx     Outpatient Encounter Medications as of 07/31/2023  Medication Sig   clonazePAM  (KLONOPIN ) 0.5 MG tablet Take 0.25-0.5 mg by mouth daily as needed for anxiety.   Continuous Glucose Receiver (FREESTYLE LIBRE 3 READER) DEVI 1 Piece by Does not apply route once as needed for up to 1 dose.   Continuous Glucose Sensor (FREESTYLE LIBRE 3 PLUS SENSOR) MISC Change sensor every 15 days.   Insulin  Lispro Prot & Lispro (HUMALOG  MIX 75/25 KWIKPEN) (75-25) 100 UNIT/ML Kwikpen Inject 20-30 Units into the skin 2 (two) times daily before a meal. 30 units with breakfast and 20 units with supper as directed.   lisinopril  (ZESTRIL ) 20 MG tablet Take 1 tablet (20 mg total) by mouth daily.   meclizine  (ANTIVERT ) 12.5 MG tablet Take 12.5 mg by mouth 3 (three) times daily as needed for dizziness.   metoprolol  succinate (TOPROL -XL) 50 MG 24 hr tablet Take 50 mg by mouth at bedtime. Take with or immediately following a meal.   Misc Natural Products (ZARBEES ALL-IN-ONE PO) Take 3 tablets by mouth daily.   ondansetron  (ZOFRAN ) 8 MG tablet Take 1 tablet (8 mg total) by mouth every 8 (eight) hours as needed for nausea.   ondansetron  (ZOFRAN -ODT) 8 MG disintegrating tablet Take 1 tablet (8 mg total) by mouth every 8 (eight) hours as needed for nausea or vomiting.   oxyCODONE  (OXY IR/ROXICODONE ) 5 MG immediate release tablet Take 1-2 tablets (5-10 mg total) by mouth every 6 (six) hours as needed for moderate pain, severe pain or breakthrough pain.   promethazine  (PHENERGAN ) 12.5 MG tablet Take 12.5 mg by mouth every 6 (six) hours as needed. (Patient not taking: Reported on 02/20/2020)   rosuvastatin  (CRESTOR ) 10 MG tablet Take 10 mg by mouth at bedtime.   tamsulosin (FLOMAX) 0.4 MG CAPS capsule Take 0.4 mg by mouth daily as needed. For kidney stone pain   Vitamin D , Ergocalciferol , (DRISDOL ) 1.25 MG (50000 UT) CAPS  capsule Take 50,000 Units by mouth every Saturday.   [DISCONTINUED] Insulin  Lispro Prot & Lispro (HUMALOG  MIX 75/25 KWIKPEN) (75-25) 100 UNIT/ML Kwikpen Inject 20 Units into the skin 2 (two) times daily before a meal.   Facility-Administered Encounter Medications as of 07/31/2023  Medication   sodium chloride  0.9 % 1,000 mL with thiamine  100 mg, folic acid  1 mg, multivitamins adult 10 mL infusion   sodium chloride  0.9 % 1,000 mL with thiamine  100 mg, folic acid  1 mg, multivitamins adult 10 mL infusion    ALLERGIES: Allergies  Allergen Reactions   Ciprofloxacin Hives    VACCINATION STATUS:  There is no immunization history on file for this patient.  Diabetes She presents for her follow-up  diabetic visit. She has type 2 diabetes mellitus. Onset time: She was diagnosed at approximate age of 51 years.  After Whipple surgery in December 2020, she was put on insulin . Her disease course has been improving. There are no hypoglycemic associated symptoms. Pertinent negatives for hypoglycemia include no confusion, headaches, pallor or seizures. There are no diabetic associated symptoms. Pertinent negatives for diabetes include no chest pain, no polydipsia, no polyphagia and no polyuria. There are no hypoglycemic complications. Symptoms are improving. (She does not report gross complications from her diabetes.  However this patient has comorbid conditions including nonalcoholic fatty liver disease, hyperlipidemia, hypertension, obesity class I) Risk factors for coronary artery disease include dyslipidemia, diabetes mellitus, obesity, sedentary lifestyle and post-menopausal. Current diabetic treatment includes insulin  injections. Her weight is increasing steadily. She is following a generally unhealthy diet. When asked about meal planning, she reported none. She has not had a previous visit with a dietitian. Her home blood glucose trend is decreasing steadily. Her breakfast blood glucose range is generally  140-180 mg/dl. Her lunch blood glucose range is generally 180-200 mg/dl. Her dinner blood glucose range is generally 180-200 mg/dl. Her bedtime blood glucose range is generally 140-180 mg/dl. Her overall blood glucose range is 140-180 mg/dl. (She presents with her freestyle libre device showing better glycemic profile with 42% time in range, 37% level 1 hyperglycemia, 21% level 2 hyperglycemia.  She has no documented hypoglycemia.  Her point-of-care A1c is 8.9% improving from 9.4%.  However, most recent average blood glucose is 201 mg per DL.   She did not call for hyperglycemia.  She did not want to go on GLP-1 receptor agonist for fear of side effects.) An ACE inhibitor/angiotensin II receptor blocker is being taken.  Hyperlipidemia This is a chronic problem. The current episode started more than 1 year ago. The problem is uncontrolled. Exacerbating diseases include diabetes and obesity. Pertinent negatives include no chest pain, myalgias or shortness of breath. Current antihyperlipidemic treatment includes statins. Risk factors for coronary artery disease include dyslipidemia, diabetes mellitus, hypertension, a sedentary lifestyle and post-menopausal.  Hypertension This is a chronic problem. The current episode started more than 1 year ago. Pertinent negatives include no chest pain, headaches, palpitations or shortness of breath. Risk factors for coronary artery disease include dyslipidemia, diabetes mellitus, obesity, sedentary lifestyle and post-menopausal state. Past treatments include ACE inhibitors.     Objective:       07/31/2023    2:45 PM 05/01/2023   11:20 AM 11/07/2022    9:39 AM  Vitals with BMI  Height 5' 4 5' 4 5' 4  Weight 202 lbs 3 oz 193 lbs 10 oz 186 lbs 10 oz  BMI 34.69 33.21 32.01  Systolic 110 110 881  Diastolic 68 66 72  Pulse 80 76 80    BP 110/68   Pulse 80   Ht 5' 4 (1.626 m)   Wt 202 lb 3.2 oz (91.7 kg)   LMP 01/12/2019   BMI 34.71 kg/m   Wt Readings from  Last 3 Encounters:  07/31/23 202 lb 3.2 oz (91.7 kg)  05/01/23 193 lb 9.6 oz (87.8 kg)  11/07/22 186 lb 9.6 oz (84.6 kg)      CMP ( most recent) CMP     Component Value Date/Time   NA 140 04/21/2023 1005   K 5.1 04/21/2023 1005   CL 106 04/21/2023 1005   CO2 19 (L) 04/21/2023 1005   GLUCOSE 134 (H) 04/21/2023 1005   GLUCOSE 76 02/25/2019 0505  BUN 15 04/21/2023 1005   CREATININE 0.69 04/21/2023 1005   CALCIUM  9.1 04/21/2023 1005   PROT 7.0 05/29/2023 1532   PROT 7.1 04/21/2023 1005   ALBUMIN  3.6 05/29/2023 1532   ALBUMIN  3.8 04/21/2023 1005   AST 23 05/29/2023 1532   ALT 27 05/29/2023 1532   ALKPHOS 126 (H) 05/29/2023 1532   BILITOT 0.3 05/29/2023 1532   BILITOT 0.4 04/21/2023 1005   GFRNONAA >60 02/25/2019 0505   GFRAA >60 02/25/2019 0505     Diabetic Labs (most recent): Lab Results  Component Value Date   HGBA1C 8.9 (A) 07/31/2023   HGBA1C 9.4 (A) 05/01/2023   HGBA1C 8.3 (A) 11/07/2022     Lipid Panel ( most recent) Lipid Panel     Component Value Date/Time   CHOL 102 04/21/2023 1005   TRIG 59 04/21/2023 1005   HDL 55 04/21/2023 1005   CHOLHDL 1.9 04/21/2023 1005   LDLCALC 34 04/21/2023 1005   LABVLDL 13 04/21/2023 1005    Thyroid  ultrasound on April 04, 2022 IMPRESSION: 1. Overall, similar appearance of the enlarged, heterogeneous and multinodular thyroid  gland. 2. Nodules # 4, # 7 and # 8 all demonstrate 1 year of stability but remain consistent with TI-RADS category 4 nodules which warrant continued imaging surveillance. Recommend follow-up ultrasound in 1  year. 3. No new nodules or suspicious features.  Repeat Thyroid  Ultrasound on 04/25/2023 IMPRESSION: 1. Similar benign appearing multinodular goiter. 2. The previously visualized left inferior (labeled 7, 1.1 cm, previously labeled 8, 1.1 cm) and right inferior (labeled 2, 1.0 cm, previously labeled 5, 1.0 cm) now demonstrate benign features and do  not require additional  follow-up.   Assessment & Plan:   1. Type 2 diabetes mellitus with hyperglycemia, with long-term current use of insulin  (HCC)   - Leah Farmer has currently uncontrolled symptomatic type 2 DM since  58 years of age.  She presents with her freestyle libre device showing better glycemic profile with 42% time in range, 37% level 1 hyperglycemia, 21% level 2 hyperglycemia.  She has no documented hypoglycemia.  Her point-of-care A1c is 8.9% improving from 9.4%.  However, most recent average blood glucose is 201 mg per DL.   She did not call for hyperglycemia.  She did not want to go on GLP-1 receptor agonist for fear of side effects.  Recent labs reviewed. - I had a long discussion with her about the progressive nature of diabetes and the pathology behind its complications. -her diabetes is complicated by nonalcoholic fatty liver disease, obesity/sedentary life, comorbid hyperlipidemia, hypertension, history of pancreatic cancer status post pancreatic resection in December 2020 and she remains at a high risk for more acute and chronic complications which include CAD, CVA, CKD, retinopathy, and neuropathy. These are all discussed in detail with her.  - I discussed all available options of managing her diabetes including de-escalation of medications. I have counseled her on diet  and weight management  by adopting a Whole Food , Plant Predominant  ( WFPP) nutrition as recommended by [employer] of Lifestyle Medicine. Patient is encouraged to switch to  unprocessed or minimally processed  complex starch, adequate protein intake (mainly plant source), minimal liquid fat ( mainly vegetable oils), plenty of fruits, and vegetables. -  she is advised to stick to a routine mealtimes to eat 3 complete meals a day and snack only when necessary ( to snack only to correct hypoglycemia BG <70 day time or <100 at night).  In light of her significant  metabolic dysfunction including nonalcoholic fatty liver  disease, hyperlipidemia, hypertension, obesity, she remains a good candidate for lifestyle meds.  She did not engage with Lifestyle Medicine nutrition optimally. - she acknowledges that there is a room for improvement in her food and drink choices. - Suggestion is made for her to avoid simple carbohydrates  from her diet including Cakes, Sweet Desserts, Ice Cream, Soda (diet and regular), Sweet Tea, Candies, Chips, Cookies, Store Bought Juices, Alcohol , Artificial Sweeteners,  Coffee Creamer, and Sugar-free Products, Lemonade. This will help patient to have more stable blood glucose profile and potentially avoid unintended weight gain.  - I have approached her with the following plan to manage  her diabetes and patient agrees:   - She is responding to the premixed insulin  favorably.  - She is advised to increase her Humalog  75/25 to 30 units with breakfast and 20 units with supper when glucose readings are above 90 and when she is eating.  She would like to avoid GLP-1R agonists.  She is benefiting from her freestyle libre device CGM device.  - she is encouraged to call clinic for blood glucose levels less than 70 or above 200 mg /dl weekly average.     - Specific targets for  A1c;  LDL, HDL,  and Triglycerides were discussed with the patient.  2) Blood Pressure /Hypertension:  -Her blood pressure is controlled to target.   she is advised to continue her current medications including lisinopril  20 mg p.o. daily, metoprolol  50 mg p.o. daily with breakfast .  3) Lipids/Hyperlipidemia:   Her recent lipid panel showed controlled LDL at 34.  -She is advised to continue Crestor  10 mg p.o. nightly.     Whole food plant-based diet described above is highly recommended for her to achieve control of her lipid panel in light of her medical history of nonalcoholic fatty liver disease (metabolic dysfunction associated steatotic liver disease).   4)  Weight/Diet:  Body mass index is 34.71 kg/m.  -    clearly complicating her diabetes care.   she is  a candidate for weight loss. I discussed with her the fact that loss of 5 - 10% of her  current body weight will have the most impact on her diabetes management.  The above detailed  ACLM recommendations for nutrition, exercise, sleep, social life, avoidance of risky substances, the need for restorative sleep   information will also detailed on discharge instructions.   5) multi-nodular goiter: I reviewed her recent thyroid  ultrasound which showed stable findings, no further imaging or intervention is needed. See above.    6) Chronic Care/Health Maintenance:  -she  is on ACEI/ARB and Statin medications and  is encouraged to initiate and continue to follow up with Ophthalmology, Dentist,  Podiatrist at least yearly or according to recommendations, and advised to   stay away from smoking. I have recommended yearly flu vaccine and pneumonia vaccine at least every 5 years; moderate intensity exercise for up to 150 minutes weekly; and  sleep for 7- 9 hours a day.  - she is  advised to maintain close follow up with Darcie Lerner, NP for primary care needs, as well as her other providers for optimal and coordinated care.   I spent  26  minutes in the care of the patient today including review of labs from CMP, Lipids, Thyroid  Function, Hematology (current and previous including abstractions from other facilities); face-to-face time discussing  her blood glucose readings/logs, discussing hypoglycemia and hyperglycemia episodes and symptoms,  medications doses, her options of short and long term treatment based on the latest standards of care / guidelines;  discussion about incorporating lifestyle medicine;  and documenting the encounter. Risk reduction counseling performed per USPSTF guidelines to reduce  obesity and cardiovascular risk factors.     Please refer to Patient Instructions for Blood Glucose Monitoring and Insulin /Medications Dosing Guide  in  media tab for additional information. Please  also refer to  Patient Self Inventory in the Media  tab for reviewed elements of pertinent patient history.  Leah Farmer participated in the discussions, expressed understanding, and voiced agreement with the above plans.  All questions were answered to her satisfaction. she is encouraged to contact clinic should she have any questions or concerns prior to her return visit.    Follow up plan: - Return in about 4 months (around 11/30/2023) for Bring Meter/CGM Device/Logs- A1c in Office.  Leah Earl, MD [facility] [address] [place], [place] [address] Phone: [phone]  Fax: [phone]    07/31/2023, 4:41 PM  This note was partially dictated with voice recognition software. Similar sounding words can be transcribed inadequately or may not  be corrected upon review.

## 2023-08-03 ENCOUNTER — Other Ambulatory Visit: Payer: Self-pay | Admitting: "Endocrinology

## 2023-12-06 ENCOUNTER — Ambulatory Visit: Admitting: "Endocrinology

## 2024-01-31 ENCOUNTER — Encounter: Payer: Self-pay | Admitting: "Endocrinology

## 2024-01-31 ENCOUNTER — Ambulatory Visit: Admitting: "Endocrinology

## 2024-01-31 VITALS — BP 119/74 | HR 93 | Resp 18 | Ht 64.0 in | Wt 205.6 lb

## 2024-01-31 DIAGNOSIS — K760 Fatty (change of) liver, not elsewhere classified: Secondary | ICD-10-CM

## 2024-01-31 DIAGNOSIS — E66811 Obesity, class 1: Secondary | ICD-10-CM

## 2024-01-31 DIAGNOSIS — E782 Mixed hyperlipidemia: Secondary | ICD-10-CM

## 2024-01-31 DIAGNOSIS — Z794 Long term (current) use of insulin: Secondary | ICD-10-CM

## 2024-01-31 DIAGNOSIS — Z6834 Body mass index (BMI) 34.0-34.9, adult: Secondary | ICD-10-CM

## 2024-01-31 DIAGNOSIS — E6609 Other obesity due to excess calories: Secondary | ICD-10-CM

## 2024-01-31 DIAGNOSIS — E1165 Type 2 diabetes mellitus with hyperglycemia: Secondary | ICD-10-CM | POA: Diagnosis not present

## 2024-01-31 DIAGNOSIS — I10 Essential (primary) hypertension: Secondary | ICD-10-CM | POA: Diagnosis not present

## 2024-01-31 LAB — POCT GLYCOSYLATED HEMOGLOBIN (HGB A1C)

## 2024-01-31 MED ORDER — FREESTYLE LIBRE 3 PLUS SENSOR MISC: 2 refills | Status: AC

## 2024-01-31 MED ORDER — INSULIN LISPRO PROT & LISPRO (75-25 MIX) 100 UNIT/ML KWIKPEN: 30.0000 [IU] | PEN_INJECTOR | Freq: Two times a day (BID) | SUBCUTANEOUS | 2 refills | Status: AC

## 2024-01-31 NOTE — Progress Notes (Signed)
 "                         01/31/2024, 5:35 PM   Subjective:    Patient ID: Leah Farmer, female    DOB: 04-13-1965.  Leah Farmer is being seen in follow-up after she was seen in consultation for management of currently uncontrolled symptomatic diabetes requested by  Darcie Lerner, NP.   Past Medical History:  Diagnosis Date   Anxiety    Arthritis 2020   lower back and hip; takes only OTC Motrin   Depression    mild and situational; not medicated   Diabetes mellitus without complication (HCC)    unsure if type 1 or 2 , dx as an adult    GERD (gastroesophageal reflux disease)    History of kidney stones 07/2018   hospitalized at [facility]; passed on her own   Hypertension    NAFL (nonalcoholic fatty liver) 07/08/2018   fatty liver seen on CT scan abd/ pelvis   Nephrolithiasis    lov in june 2020 , also had covid at this time    pancreatic ca dx'd 08/2018   pancreatic   Pneumonia    PONV (postoperative nausea and vomiting)    Tachycardia    generally runs in the 120s    Past Surgical History:  Procedure Laterality Date   APPENDECTOMY  in 20s   BIOPSY  09/19/2022   Procedure: BIOPSY;  Surgeon: Wilhelmenia Aloha Raddle., MD;  Location: [facility];  Service: Gastroenterology;;   CHOLECYSTECTOMY  in 20s   ENDOSCOPIC RETROGRADE CHOLANGIOPANCREATOGRAPHY (ERCP) WITH PROPOFOL  N/A 09/19/2022   Procedure: ENDOSCOPIC RETROGRADE CHOLANGIOPANCREATOGRAPHY (ERCP) WITH PROPOFOL ;  Surgeon: Wilhelmenia Aloha Raddle., MD;  Location: [facility];  Service: Gastroenterology;  Laterality: N/A;   ESOPHAGOGASTRODUODENOSCOPY (EGD) WITH PROPOFOL  N/A 11/01/2018   Procedure: ESOPHAGOGASTRODUODENOSCOPY (EGD) WITH PROPOFOL ;  Surgeon: Teressa Toribio SQUIBB, MD;  Location: [facility];  Service: Endoscopy;  Laterality: N/A;   EUS N/A 11/01/2018   Procedure: UPPER ENDOSCOPIC ULTRASOUND (EUS) LINEAR;  Surgeon: Teressa Toribio SQUIBB, MD;  Location: [facility];  Service: Endoscopy;  Laterality: N/A;   FINE NEEDLE  ASPIRATION N/A 11/01/2018   Procedure: FINE NEEDLE ASPIRATION (FNA) LINEAR;  Surgeon: Teressa Toribio SQUIBB, MD;  Location: [facility];  Service: Endoscopy;  Laterality: N/A;   IR CATHETER TUBE CHANGE  02/16/2019   IR RADIOLOGIST EVAL & MGMT  03/14/2019   IR THORACENTESIS ASP PLEURAL SPACE W/IMG GUIDE  01/21/2019   LAPAROSCOPY N/A 01/15/2019   Procedure: LAPAROSCOPY DIAGNOSTIC;  Surgeon: Aron Shoulders, MD;  Location: [facility];  Service: General;  Laterality: N/A;   STENT REMOVAL  09/19/2022   Procedure: STENT REMOVAL;  Surgeon: Wilhelmenia Aloha Raddle., MD;  Location: [facility];  Service: Gastroenterology;;  pancreatic stent   SUBMUCOSAL TATTOO INJECTION  09/19/2022   Procedure: SUBMUCOSAL TATTOO INJECTION;  Surgeon: Wilhelmenia Aloha Raddle., MD;  Location: [facility];  Service: Gastroenterology;;   [facility] PROCEDURE N/A 01/15/2019   Procedure: WHIPPLE PROCEDURE;  Surgeon: Aron Shoulders, MD;  Location: [facility] OR;  Service: General;  Laterality: N/A;    Social History   Socioeconomic History   Marital status: Married    Spouse name: Not on file   Number of children: Not on file   Years of education: Not on file   Highest education level: Not on file  Occupational History   Not on file  Tobacco Use   Smoking status: Never   Smokeless tobacco: Never  Vaping Use   Vaping  status: Never Used  Substance and Sexual Activity   Alcohol use: Never   Drug use: Never   Sexual activity: Not Currently  Other Topics Concern   Not on file  Social History Narrative   Not on file   Social Drivers of Health   Tobacco Use: Low Risk (01/31/2024)   Patient History    Smoking Tobacco Use: Never    Smokeless Tobacco Use: Never    Passive Exposure: Not on file  Financial Resource Strain: Not on file  Food Insecurity: Not on file  Transportation Needs: Not on file  Physical Activity: Not on file  Stress: Not on file  Social Connections: Not on file  Depression (EYV7-0): Not on file  Alcohol Screen: Not  on file  Housing: Not on file  Utilities: Not on file  Health Literacy: Not on file    Family History  Problem Relation Age of Onset   Hypertension Mother    Diabetes Mother    Pancreatic cancer Mother    Hypertension Father    Diabetes Father    Esophageal cancer Maternal Uncle        Passed at 27. Found colon cancer on colonoscopy at that time.    Colon cancer Neg Hx    Colon polyps Neg Hx     Outpatient Encounter Medications as of 01/31/2024  Medication Sig   clonazePAM  (KLONOPIN ) 0.5 MG tablet Take 0.25-0.5 mg by mouth daily as needed for anxiety.   Continuous Glucose Receiver (FREESTYLE LIBRE 3 READER) DEVI 1 Piece by Does not apply route once as needed for up to 1 dose.   lisinopril  (ZESTRIL ) 20 MG tablet Take 1 tablet (20 mg total) by mouth daily.   meclizine  (ANTIVERT ) 12.5 MG tablet Take 12.5 mg by mouth 3 (three) times daily as needed for dizziness.   metoprolol  succinate (TOPROL -XL) 50 MG 24 hr tablet Take 50 mg by mouth at bedtime. Take with or immediately following a meal.   Misc Natural Products (ZARBEES ALL-IN-ONE PO) Take 3 tablets by mouth daily.   ondansetron  (ZOFRAN ) 8 MG tablet Take 1 tablet (8 mg total) by mouth every 8 (eight) hours as needed for nausea.   ondansetron  (ZOFRAN -ODT) 8 MG disintegrating tablet Take 1 tablet (8 mg total) by mouth every 8 (eight) hours as needed for nausea or vomiting.   oxyCODONE  (OXY IR/ROXICODONE ) 5 MG immediate release tablet Take 1-2 tablets (5-10 mg total) by mouth every 6 (six) hours as needed for moderate pain, severe pain or breakthrough pain.   rosuvastatin  (CRESTOR ) 10 MG tablet Take 10 mg by mouth at bedtime.   tamsulosin (FLOMAX) 0.4 MG CAPS capsule Take 0.4 mg by mouth daily as needed. For kidney stone pain   Vitamin D , Ergocalciferol , (DRISDOL ) 1.25 MG (50000 UT) CAPS capsule Take 50,000 Units by mouth every Saturday.   [DISCONTINUED] Continuous Glucose Sensor (FREESTYLE LIBRE 3 PLUS SENSOR) MISC Change sensor every 15  days.   [DISCONTINUED] Insulin  Lispro Prot & Lispro (HUMALOG  MIX 75/25 KWIKPEN) (75-25) 100 UNIT/ML Kwikpen Inject 20-30 Units into the skin 2 (two) times daily before a meal. 30 units with breakfast and 20 units with supper as directed.   Continuous Glucose Sensor (FREESTYLE LIBRE 3 PLUS SENSOR) MISC Change sensor every 15 days.   Insulin  Lispro Prot & Lispro (HUMALOG  MIX 75/25 KWIKPEN) (75-25) 100 UNIT/ML Kwikpen Inject 30-40 Units into the skin 2 (two) times daily before a meal.   promethazine  (PHENERGAN ) 12.5 MG tablet Take 12.5 mg by mouth every 6 (  six) hours as needed. (Patient not taking: Reported on 01/31/2024)   Facility-Administered Encounter Medications as of 01/31/2024  Medication   sodium chloride  0.9 % 1,000 mL with thiamine  100 mg, folic acid  1 mg, multivitamins adult 10 mL infusion   sodium chloride  0.9 % 1,000 mL with thiamine  100 mg, folic acid  1 mg, multivitamins adult 10 mL infusion    ALLERGIES: Allergies  Allergen Reactions   Ciprofloxacin Hives    VACCINATION STATUS:  There is no immunization history on file for this patient.  Diabetes She presents for her follow-up diabetic visit. She has type 2 diabetes mellitus. Onset time: She was diagnosed at approximate age of 51 years.  After Whipple surgery in December 2020, she was put on insulin . Her disease course has been worsening. There are no hypoglycemic associated symptoms. Pertinent negatives for hypoglycemia include no confusion, headaches, pallor or seizures. There are no diabetic associated symptoms. Pertinent negatives for diabetes include no chest pain, no polydipsia, no polyphagia and no polyuria. There are no hypoglycemic complications. Symptoms are worsening. (She does not report gross complications from her diabetes.  However this patient has comorbid conditions including nonalcoholic fatty liver disease, hyperlipidemia, hypertension, obesity class I) Risk factors for coronary artery disease include  dyslipidemia, diabetes mellitus, obesity, sedentary lifestyle and post-menopausal. Current diabetic treatment includes insulin  injections. Her weight is increasing steadily. She is following a generally unhealthy diet. When asked about meal planning, she reported none. She has not had a previous visit with a dietitian. Her home blood glucose trend is increasing steadily. Her breakfast blood glucose range is generally >200 mg/dl. Her lunch blood glucose range is generally >200 mg/dl. Her dinner blood glucose range is generally >200 mg/dl. Her bedtime blood glucose range is generally >200 mg/dl. Her overall blood glucose range is >200 mg/dl. (She presents with her freestyle libre device showing above target glycemic profile.  She has 25% time in range, 45% level 1 hyperglycemia, 30% level 2 hyperglycemia.  She has no hypoglycemia.  Her point-of-care A1c is elevated at 9.5% increasing from 8.9% during her last visit.  Her average blood glucose is 220 mg per DL for the most recent 14 days.    She did not call for hyperglycemia.  She did not want to go on GLP-1 receptor agonist for fear of side effects.) An ACE inhibitor/angiotensin II receptor blocker is being taken.  Hyperlipidemia This is a chronic problem. The current episode started more than 1 year ago. The problem is uncontrolled. Exacerbating diseases include diabetes and obesity. Pertinent negatives include no chest pain, myalgias or shortness of breath. Current antihyperlipidemic treatment includes statins. Risk factors for coronary artery disease include dyslipidemia, diabetes mellitus, hypertension, a sedentary lifestyle and post-menopausal.  Hypertension This is a chronic problem. The current episode started more than 1 year ago. Pertinent negatives include no chest pain, headaches, palpitations or shortness of breath. Risk factors for coronary artery disease include dyslipidemia, diabetes mellitus, obesity, sedentary lifestyle and post-menopausal  state. Past treatments include ACE inhibitors.    Objective:       01/31/2024    2:54 PM 07/31/2023    2:45 PM 05/01/2023   11:20 AM  Vitals with BMI  Height 5' 4 5' 4 5' 4  Weight 205 lbs 10 oz 202 lbs 3 oz 193 lbs 10 oz  BMI 35.27 34.69 33.21  Systolic 119 110 889  Diastolic 74 68 66  Pulse 93 80 76    BP 119/74   Pulse 93   Resp  18   Ht 5' 4 (1.626 m)   Wt 205 lb 9.6 oz (93.3 kg)   LMP 01/12/2019   SpO2 99%   BMI 35.29 kg/m   Wt Readings from Last 3 Encounters:  01/31/24 205 lb 9.6 oz (93.3 kg)  07/31/23 202 lb 3.2 oz (91.7 kg)  05/01/23 193 lb 9.6 oz (87.8 kg)     CMP ( most recent) CMP     Component Value Date/Time   NA 140 04/21/2023 1005   K 5.1 04/21/2023 1005   CL 106 04/21/2023 1005   CO2 19 (L) 04/21/2023 1005   GLUCOSE 134 (H) 04/21/2023 1005   GLUCOSE 76 02/25/2019 0505   BUN 15 04/21/2023 1005   CREATININE 0.69 04/21/2023 1005   CALCIUM  9.1 04/21/2023 1005   PROT 7.0 05/29/2023 1532   PROT 7.1 04/21/2023 1005   ALBUMIN  3.6 05/29/2023 1532   ALBUMIN  3.8 04/21/2023 1005   AST 23 05/29/2023 1532   ALT 27 05/29/2023 1532   ALKPHOS 126 (H) 05/29/2023 1532   BILITOT 0.3 05/29/2023 1532   BILITOT 0.4 04/21/2023 1005   GFRNONAA >60 02/25/2019 0505   GFRAA >60 02/25/2019 0505    Diabetic Labs (most recent): Lab Results  Component Value Date   HGBA1C 8.9 (A) 07/31/2023   HGBA1C 9.4 (A) 05/01/2023   HGBA1C 8.3 (A) 11/07/2022     Lipid Panel ( most recent) Lipid Panel     Component Value Date/Time   CHOL 102 04/21/2023 1005   TRIG 59 04/21/2023 1005   HDL 55 04/21/2023 1005   CHOLHDL 1.9 04/21/2023 1005   LDLCALC 34 04/21/2023 1005   LABVLDL 13 04/21/2023 1005    Thyroid  ultrasound on April 04, 2022 IMPRESSION: 1. Overall, similar appearance of the enlarged, heterogeneous and multinodular thyroid  gland. 2. Nodules # 4, # 7 and # 8 all demonstrate 1 year of stability but remain consistent with TI-RADS category 4 nodules which  warrant continued imaging surveillance. Recommend follow-up ultrasound in 1  year. 3. No new nodules or suspicious features.  Repeat Thyroid  Ultrasound on 04/25/2023 IMPRESSION: 1. Similar benign appearing multinodular goiter. 2. The previously visualized left inferior (labeled 7, 1.1 cm, previously labeled 8, 1.1 cm) and right inferior (labeled 2, 1.0 cm, previously labeled 5, 1.0 cm) now demonstrate benign features and do  not require additional follow-up.   Assessment & Plan:   1. Type 2 diabetes mellitus with hyperglycemia, with long-term current use of insulin  (HCC)   - Leah Farmer has currently uncontrolled symptomatic type 2 DM since  58 years of age.  She presents with her freestyle libre device showing above target glycemic profile.  She has 25% time in range, 45% level 1 hyperglycemia, 30% level 2 hyperglycemia.  She has no hypoglycemia.  Her point-of-care A1c is elevated at 9.5% increasing from 8.9% during her last visit.  Her average blood glucose is 220 mg per DL for the most recent 14 days.    She did not call for hyperglycemia.  She did not want to go on GLP-1 receptor agonist for fear of side effects.  Recent labs reviewed. - I had a long discussion with her about the progressive nature of diabetes and the pathology behind its complications. -her diabetes is complicated by nonalcoholic fatty liver disease, obesity/sedentary life, comorbid hyperlipidemia, hypertension, history of pancreatic cancer status post pancreatic resection in December 2020 and she remains at a high risk for more acute and chronic complications which include CAD, CVA, CKD, retinopathy, and neuropathy.  These are all discussed in detail with her.  - I discussed all available options of managing her diabetes including de-escalation of medications. I have counseled her on diet  and weight management  by adopting a Whole Food , Plant Predominant  ( WFPP) nutrition as recommended by [employer] of  Lifestyle Medicine. Patient is encouraged to switch to  unprocessed or minimally processed  complex starch, adequate protein intake (mainly plant source), minimal liquid fat ( mainly vegetable oils), plenty of fruits, and vegetables. -  she is advised to stick to a routine mealtimes to eat 3 complete meals a day and snack only when necessary ( to snack only to correct hypoglycemia BG <70 day time or <100 at night).  In light of her significant metabolic dysfunction including nonalcoholic fatty liver disease, hyperlipidemia, hypertension, obesity, she remains a good candidate for lifestyle meds.  She did not engage with Lifestyle Medicine nutrition optimally. - she acknowledges that there is a room for improvement in her food and drink choices. - Suggestion is made for her to avoid simple carbohydrates  from her diet including Cakes, Sweet Desserts, Ice Cream, Soda (diet and regular), Sweet Tea, Candies, Chips, Cookies, Store Bought Juices, Alcohol , Artificial Sweeteners,  Coffee Creamer, and Sugar-free Products, Lemonade. This will help patient to have more stable blood glucose profile and potentially avoid unintended weight gain.  - I have approached her with the following plan to manage  her diabetes and patient agrees:    - She will require higher dose of insulin .  Advised her to increase her Humalog  75/25 to 40 units with breakfast and 30 units with supper when glucose readings are above 90 and when she is eating.  She would have benefited from GLP-1 receptor agonist, however she would like to avoid GLP-1R agonists for fear of side effects.  She is benefiting from her freestyle libre device CGM device.  - she is encouraged to call clinic for blood glucose levels less than 70 or above 200 mg /dl weekly average.     - Specific targets for  A1c;  LDL, HDL,  and Triglycerides were discussed with the patient.  2) Blood Pressure /Hypertension:  Her blood pressure is controlled to target.   she  is advised to continue her current medications including lisinopril  20 mg p.o. daily, metoprolol  50 mg p.o. daily with breakfast .  3) Lipids/Hyperlipidemia:   Her recent lipid panel showed controlled LDL at 34.  -She is advised to continue Crestor  10 mg p.o. nightly.  She is benefiting from and advised to mainly maintain whole food plant-based diet from the point of view of dyslipidemia predisposing her for metabolic dysfunction associated steatotic liver disease. She is encouraged to introduce nonfatty animal products including yogurt, egg whites, lean chicken and fish.   4)  Weight/Diet:  Body mass index is 35.29 kg/m.  -   clearly complicating her diabetes care.   she is  a candidate for weight loss. I discussed with her the fact that loss of 5 - 10% of her  current body weight will have the most impact on her diabetes management.  The above detailed  ACLM recommendations for nutrition, exercise, sleep, social life, avoidance of risky substances, the need for restorative sleep   information will also detailed on discharge instructions.   5) multi-nodular goiter: I reviewed her recent thyroid  ultrasound which showed stable findings, no further imaging or intervention is needed. See above.    6) Chronic Care/Health Maintenance:  -she  is on ACEI/ARB and Statin medications and  is encouraged to initiate and continue to follow up with Ophthalmology, Dentist,  Podiatrist at least yearly or according to recommendations, and advised to   stay away from smoking. I have recommended yearly flu vaccine and pneumonia vaccine at least every 5 years; moderate intensity exercise for up to 150 minutes weekly; and  sleep for 7- 9 hours a day.  - she is  advised to maintain close follow up with Darcie Lerner, NP for primary care needs, as well as her other providers for optimal and coordinated care.   I spent  40 minutes in the care of the patient today including review of labs from CMP, Lipids, Thyroid   Function, Hematology (current and previous including abstractions from other facilities); face-to-face time discussing  her blood glucose readings/logs, discussing hypoglycemia and hyperglycemia episodes and symptoms, medications doses, her options of short and long term treatment based on the latest standards of care / guidelines;  discussion about incorporating lifestyle medicine;  and documenting the encounter. Risk reduction counseling performed per USPSTF guidelines to reduce  obesity and cardiovascular risk factors.     Please refer to Patient Instructions for Blood Glucose Monitoring and Insulin /Medications Dosing Guide  in media tab for additional information. Please  also refer to  Patient Self Inventory in the Media  tab for reviewed elements of pertinent patient history.  Leah Farmer participated in the discussions, expressed understanding, and voiced agreement with the above plans.  All questions were answered to her satisfaction. she is encouraged to contact clinic should she have any questions or concerns prior to her return visit.   Follow up plan: - Return in about 4 months (around 05/30/2024) for Bring Meter/CGM Device/Logs- A1c in Office.  Ranny Earl, MD [facility] [address] [place], [place] [address] Phone: [phone]  Fax: [phone]    01/31/2024, 5:35 PM  This note was partially dictated with voice recognition software. Similar sounding words can be transcribed inadequately or may not  be corrected upon review.  "

## 2024-01-31 NOTE — Patient Instructions (Signed)

## 2024-05-30 ENCOUNTER — Ambulatory Visit: Admitting: "Endocrinology
